# Patient Record
Sex: Female | Born: 1945 | Race: White | Hispanic: No | Marital: Married | Smoking: Former smoker
Health system: Southern US, Community
[De-identification: ages and names within clinical notes are randomized; demographics above are authoritative.]

## PROBLEM LIST (undated history)

## (undated) ENCOUNTER — Emergency Department (HOSPITAL_COMMUNITY): Payer: Medicare Other

## (undated) DIAGNOSIS — I10 Essential (primary) hypertension: Secondary | ICD-10-CM

## (undated) DIAGNOSIS — R600 Localized edema: Secondary | ICD-10-CM

## (undated) DIAGNOSIS — K219 Gastro-esophageal reflux disease without esophagitis: Secondary | ICD-10-CM

## (undated) DIAGNOSIS — F419 Anxiety disorder, unspecified: Secondary | ICD-10-CM

## (undated) DIAGNOSIS — R05 Cough: Secondary | ICD-10-CM

## (undated) DIAGNOSIS — R251 Tremor, unspecified: Secondary | ICD-10-CM

## (undated) DIAGNOSIS — M25569 Pain in unspecified knee: Secondary | ICD-10-CM

## (undated) DIAGNOSIS — G40909 Epilepsy, unspecified, not intractable, without status epilepticus: Secondary | ICD-10-CM

## (undated) DIAGNOSIS — R55 Syncope and collapse: Secondary | ICD-10-CM

## (undated) DIAGNOSIS — E785 Hyperlipidemia, unspecified: Secondary | ICD-10-CM

## (undated) DIAGNOSIS — I2510 Atherosclerotic heart disease of native coronary artery without angina pectoris: Secondary | ICD-10-CM

## (undated) DIAGNOSIS — R011 Cardiac murmur, unspecified: Secondary | ICD-10-CM

## (undated) DIAGNOSIS — F329 Major depressive disorder, single episode, unspecified: Secondary | ICD-10-CM

## (undated) DIAGNOSIS — K222 Esophageal obstruction: Secondary | ICD-10-CM

## (undated) DIAGNOSIS — R058 Other specified cough: Secondary | ICD-10-CM

## (undated) DIAGNOSIS — K449 Diaphragmatic hernia without obstruction or gangrene: Secondary | ICD-10-CM

## (undated) DIAGNOSIS — F32A Depression, unspecified: Secondary | ICD-10-CM

## (undated) DIAGNOSIS — R0601 Orthopnea: Secondary | ICD-10-CM

## (undated) DIAGNOSIS — R0602 Shortness of breath: Secondary | ICD-10-CM

## (undated) DIAGNOSIS — R5383 Other fatigue: Secondary | ICD-10-CM

## (undated) HISTORY — DX: Other fatigue: R53.83

## (undated) HISTORY — PX: ABDOMINAL HYSTERECTOMY: SUR658

## (undated) HISTORY — DX: Diaphragmatic hernia without obstruction or gangrene: K44.9

## (undated) HISTORY — DX: Morbid (severe) obesity due to excess calories: E66.01

## (undated) HISTORY — DX: Anxiety disorder, unspecified: F41.9

## (undated) HISTORY — DX: Epilepsy, unspecified, not intractable, without status epilepticus: G40.909

## (undated) HISTORY — DX: Hyperlipidemia, unspecified: E78.5

## (undated) HISTORY — DX: Shortness of breath: R06.02

## (undated) HISTORY — PX: KNEE ARTHROSCOPY: SUR90

## (undated) HISTORY — DX: Orthopnea: R06.01

## (undated) HISTORY — DX: Cough: R05

## (undated) HISTORY — DX: Syncope and collapse: R55

## (undated) HISTORY — DX: Localized edema: R60.0

## (undated) HISTORY — DX: Gastro-esophageal reflux disease without esophagitis: K21.9

## (undated) HISTORY — DX: Atherosclerotic heart disease of native coronary artery without angina pectoris: I25.10

## (undated) HISTORY — DX: Tremor, unspecified: R25.1

## (undated) HISTORY — DX: Other specified cough: R05.8

## (undated) HISTORY — DX: Major depressive disorder, single episode, unspecified: F32.9

## (undated) HISTORY — DX: Esophageal obstruction: K22.2

## (undated) HISTORY — DX: Essential (primary) hypertension: I10

## (undated) HISTORY — PX: TRANSTHORACIC ECHOCARDIOGRAM: SHX275

## (undated) HISTORY — DX: Pain in unspecified knee: M25.569

## (undated) HISTORY — DX: Depression, unspecified: F32.A

---

## 2006-01-04 DIAGNOSIS — K449 Diaphragmatic hernia without obstruction or gangrene: Secondary | ICD-10-CM

## 2006-01-04 DIAGNOSIS — K222 Esophageal obstruction: Secondary | ICD-10-CM

## 2006-01-04 HISTORY — DX: Diaphragmatic hernia without obstruction or gangrene: K44.9

## 2006-01-04 HISTORY — DX: Esophageal obstruction: K22.2

## 2006-04-19 ENCOUNTER — Ambulatory Visit: Payer: Self-pay | Admitting: Gastroenterology

## 2006-04-19 LAB — CONVERTED CEMR LAB
ALT: 23 units/L (ref 0–40)
AST: 22 units/L (ref 0–37)
Albumin: 3.6 g/dL (ref 3.5–5.2)
Bilirubin, Direct: 0.1 mg/dL (ref 0.0–0.3)
Folate: 11.4 ng/mL
Iron: 60 ug/dL (ref 42–145)
Vitamin B-12: 851 pg/mL (ref 211–911)

## 2006-04-27 ENCOUNTER — Ambulatory Visit: Payer: Self-pay | Admitting: Gastroenterology

## 2006-05-02 ENCOUNTER — Ambulatory Visit: Payer: Self-pay | Admitting: Gastroenterology

## 2006-05-04 ENCOUNTER — Emergency Department (HOSPITAL_COMMUNITY): Admission: EM | Admit: 2006-05-04 | Discharge: 2006-05-04 | Payer: Self-pay | Admitting: Emergency Medicine

## 2006-05-18 ENCOUNTER — Ambulatory Visit: Payer: Self-pay | Admitting: Gastroenterology

## 2006-05-23 ENCOUNTER — Ambulatory Visit: Payer: Self-pay | Admitting: Gastroenterology

## 2006-06-09 ENCOUNTER — Ambulatory Visit: Payer: Self-pay | Admitting: Gastroenterology

## 2006-06-09 LAB — CONVERTED CEMR LAB: TSH: 3.41 microintl units/mL (ref 0.35–5.50)

## 2007-05-20 DIAGNOSIS — E039 Hypothyroidism, unspecified: Secondary | ICD-10-CM | POA: Insufficient documentation

## 2007-05-20 DIAGNOSIS — K589 Irritable bowel syndrome without diarrhea: Secondary | ICD-10-CM

## 2007-05-20 DIAGNOSIS — K222 Esophageal obstruction: Secondary | ICD-10-CM

## 2007-05-20 DIAGNOSIS — K5730 Diverticulosis of large intestine without perforation or abscess without bleeding: Secondary | ICD-10-CM

## 2007-05-20 DIAGNOSIS — F339 Major depressive disorder, recurrent, unspecified: Secondary | ICD-10-CM

## 2007-05-20 DIAGNOSIS — K449 Diaphragmatic hernia without obstruction or gangrene: Secondary | ICD-10-CM

## 2007-05-20 DIAGNOSIS — E1159 Type 2 diabetes mellitus with other circulatory complications: Secondary | ICD-10-CM

## 2007-05-20 DIAGNOSIS — K760 Fatty (change of) liver, not elsewhere classified: Secondary | ICD-10-CM

## 2007-05-20 DIAGNOSIS — I10 Essential (primary) hypertension: Secondary | ICD-10-CM

## 2007-05-20 DIAGNOSIS — Z8673 Personal history of transient ischemic attack (TIA), and cerebral infarction without residual deficits: Secondary | ICD-10-CM

## 2008-12-25 ENCOUNTER — Ambulatory Visit (HOSPITAL_COMMUNITY): Admission: RE | Admit: 2008-12-25 | Discharge: 2008-12-25 | Payer: Self-pay | Admitting: Cardiology

## 2009-02-07 ENCOUNTER — Ambulatory Visit (HOSPITAL_COMMUNITY): Admission: RE | Admit: 2009-02-07 | Discharge: 2009-02-07 | Payer: Self-pay | Admitting: Cardiology

## 2009-03-20 ENCOUNTER — Encounter: Admission: RE | Admit: 2009-03-20 | Discharge: 2009-03-20 | Payer: Self-pay | Admitting: Cardiology

## 2009-03-26 ENCOUNTER — Inpatient Hospital Stay (HOSPITAL_COMMUNITY): Admission: RE | Admit: 2009-03-26 | Discharge: 2009-03-30 | Payer: Self-pay | Admitting: Cardiovascular Disease

## 2009-03-27 ENCOUNTER — Encounter (INDEPENDENT_AMBULATORY_CARE_PROVIDER_SITE_OTHER): Payer: Self-pay | Admitting: Cardiovascular Disease

## 2009-03-27 ENCOUNTER — Emergency Department (HOSPITAL_COMMUNITY): Admission: EM | Admit: 2009-03-27 | Discharge: 2009-03-27 | Payer: Self-pay | Admitting: Emergency Medicine

## 2009-03-28 HISTORY — PX: CARDIAC CATHETERIZATION: SHX172

## 2009-05-01 ENCOUNTER — Inpatient Hospital Stay (HOSPITAL_COMMUNITY): Admission: EM | Admit: 2009-05-01 | Discharge: 2009-05-02 | Payer: Self-pay | Admitting: Emergency Medicine

## 2009-12-30 ENCOUNTER — Ambulatory Visit (HOSPITAL_COMMUNITY): Admission: RE | Admit: 2009-12-30 | Payer: Self-pay | Source: Home / Self Care | Admitting: Cardiology

## 2010-01-24 ENCOUNTER — Encounter: Payer: Self-pay | Admitting: Family Medicine

## 2010-01-25 ENCOUNTER — Encounter: Payer: Self-pay | Admitting: Cardiology

## 2010-03-24 LAB — GLUCOSE, CAPILLARY
Glucose-Capillary: 111 mg/dL — ABNORMAL HIGH (ref 70–99)
Glucose-Capillary: 265 mg/dL — ABNORMAL HIGH (ref 70–99)
Glucose-Capillary: 320 mg/dL — ABNORMAL HIGH (ref 70–99)
Glucose-Capillary: 354 mg/dL — ABNORMAL HIGH (ref 70–99)
Glucose-Capillary: 416 mg/dL — ABNORMAL HIGH (ref 70–99)
Glucose-Capillary: 430 mg/dL — ABNORMAL HIGH (ref 70–99)

## 2010-03-24 LAB — BASIC METABOLIC PANEL
Chloride: 93 mEq/L — ABNORMAL LOW (ref 96–112)
Creatinine, Ser: 1.11 mg/dL (ref 0.4–1.2)
GFR calc non Af Amer: 49 mL/min — ABNORMAL LOW (ref >60)
Glucose, Bld: 431 mg/dL — ABNORMAL HIGH (ref 70–99)

## 2010-03-24 LAB — CARDIAC PANEL(CRET KIN+CKTOT+MB+TROPI)
CK, MB: 1.8 ng/mL (ref 0.3–4.0)
Relative Index: INVALID (ref 0.0–2.5)
Total CK: 77 U/L (ref 7–177)

## 2010-03-24 LAB — URINALYSIS, ROUTINE W REFLEX MICROSCOPIC
Glucose, UA: >1000 mg/dL — AB
Hgb urine dipstick: NEGATIVE
Ketones, ur: NEGATIVE mg/dL
Leukocytes, UA: NEGATIVE
Protein, ur: NEGATIVE mg/dL
Urobilinogen, UA: 0.2 mg/dL (ref 0.0–1.0)
pH: 5 (ref 5.0–8.0)

## 2010-03-24 LAB — DIFFERENTIAL
Basophils Absolute: 0 10*3/uL (ref 0.0–0.1)
Neutro Abs: 5.9 10*3/uL (ref 1.7–7.7)
Neutrophils Relative %: 72 % (ref 43–77)

## 2010-03-24 LAB — CBC
Platelets: 295 10*3/uL (ref 150–400)
RDW: 15.4 % (ref 11.5–15.5)

## 2010-03-24 LAB — URINE MICROSCOPIC-ADD ON

## 2010-03-24 LAB — CK TOTAL AND CKMB (NOT AT ARMC)
CK, MB: 1.8 ng/mL (ref 0.3–4.0)
Total CK: 93 U/L (ref 7–177)

## 2010-03-29 LAB — BASIC METABOLIC PANEL
BUN: 16 mg/dL (ref 6–23)
BUN: 8 mg/dL (ref 6–23)
CO2: 25 mEq/L (ref 19–32)
Calcium: 8.3 mg/dL — ABNORMAL LOW (ref 8.4–10.5)
Calcium: 8.4 mg/dL (ref 8.4–10.5)
Chloride: 104 mEq/L (ref 96–112)
Chloride: 106 mEq/L (ref 96–112)
Creatinine, Ser: 0.67 mg/dL (ref 0.4–1.2)
Creatinine, Ser: 0.78 mg/dL (ref 0.4–1.2)
GFR calc Af Amer: >60 mL/min (ref >60)
GFR calc Af Amer: >60 mL/min (ref >60)
GFR calc Af Amer: >60 mL/min (ref >60)
GFR calc non Af Amer: >60 mL/min (ref >60)
GFR calc non Af Amer: >60 mL/min (ref >60)
Glucose, Bld: 296 mg/dL — ABNORMAL HIGH (ref 70–99)
Potassium: 4 mEq/L (ref 3.5–5.1)
Potassium: 4.2 mEq/L (ref 3.5–5.1)
Sodium: 138 mEq/L (ref 135–145)

## 2010-03-29 LAB — GLUCOSE, CAPILLARY
Glucose-Capillary: 241 mg/dL — ABNORMAL HIGH (ref 70–99)
Glucose-Capillary: 241 mg/dL — ABNORMAL HIGH (ref 70–99)
Glucose-Capillary: 247 mg/dL — ABNORMAL HIGH (ref 70–99)
Glucose-Capillary: 247 mg/dL — ABNORMAL HIGH (ref 70–99)
Glucose-Capillary: 329 mg/dL — ABNORMAL HIGH (ref 70–99)
Glucose-Capillary: 332 mg/dL — ABNORMAL HIGH (ref 70–99)
Glucose-Capillary: 397 mg/dL — ABNORMAL HIGH (ref 70–99)

## 2010-03-29 LAB — POCT I-STAT, CHEM 8
Calcium, Ion: 1.04 mmol/L — ABNORMAL LOW (ref 1.12–1.32)
Creatinine, Ser: 0.5 mg/dL (ref 0.4–1.2)
Glucose, Bld: 279 mg/dL — ABNORMAL HIGH (ref 70–99)
Hemoglobin: 11.6 g/dL — ABNORMAL LOW (ref 12.0–15.0)
Potassium: 4.4 mEq/L (ref 3.5–5.1)
TCO2: 25 mmol/L (ref 0–100)

## 2010-03-29 LAB — TROPONIN I: Troponin I: 3.74 ng/mL (ref 0.00–0.06)

## 2010-03-29 LAB — CARDIAC PANEL(CRET KIN+CKTOT+MB+TROPI): CK, MB: 199.1 ng/mL (ref 0.3–4.0)

## 2010-03-29 LAB — CBC
HCT: 27.4 % — ABNORMAL LOW (ref 36.0–46.0)
HCT: 30.2 % — ABNORMAL LOW (ref 36.0–46.0)
Hemoglobin: 10.1 g/dL — ABNORMAL LOW (ref 12.0–15.0)
Hemoglobin: 9.2 g/dL — ABNORMAL LOW (ref 12.0–15.0)
MCHC: 33.8 g/dL (ref 30.0–36.0)
MCV: 92 fL (ref 78.0–100.0)
Platelets: 255 10*3/uL (ref 150–400)
Platelets: 295 10*3/uL (ref 150–400)
RBC: 2.76 MIL/uL — ABNORMAL LOW (ref 3.87–5.11)
RBC: 2.98 MIL/uL — ABNORMAL LOW (ref 3.87–5.11)
RBC: 3.47 MIL/uL — ABNORMAL LOW (ref 3.87–5.11)
RDW: 16.8 % — ABNORMAL HIGH (ref 11.5–15.5)
RDW: 16.8 % — ABNORMAL HIGH (ref 11.5–15.5)
WBC: 10.3 10*3/uL (ref 4.0–10.5)
WBC: 9 10*3/uL (ref 4.0–10.5)

## 2010-03-29 LAB — APTT: aPTT: 27 seconds (ref 24–37)

## 2010-03-29 LAB — CK TOTAL AND CKMB (NOT AT ARMC): Relative Index: 15 — ABNORMAL HIGH (ref 0.0–2.5)

## 2010-05-15 ENCOUNTER — Institutional Professional Consult (permissible substitution): Payer: Self-pay | Admitting: Cardiovascular Disease

## 2010-05-19 NOTE — Assessment & Plan Note (Signed)
[facility] OFFICE NOTE   NAME:Consiglio, Hooper                         MRN:          [id number]  DATE:06/09/2006                            DOB:          [birth year]    [name] underwent endoscopy on April 18, 2006, and had a 3-cm hiatal  hernia with a peptic stricture.  Her esophagus was dilated.  She is much  better on Nexium 40 mg twice a day, but continues with regurgitation in  the early morning on awakening.  I have decided to place her on Reglan  10 mg at bedtime.   She continues with right upper quadrant pain, which is felt secondary to  fatty liver.  She recently had ultrasound on May 23, 2006, which showed  an enlarged echodense liver.  Exam, otherwise, was unremarkable without  evidence of cholelithiasis.  Liver function test and CBC were normal.  She does give a vague past history of thyroid disorder, and will check a  thyroid function test.  Her iron levels, B-12, and folate were all  normal.   As per our previous notes, [name] is bothered by severe obesity, and has  gained 60 pounds of weight over the last 5 years.  She has associated  hypertension, diabetes, and chronic depression.  She gives a vague  history of mini strokes, and also has a history of a seizure disorder.  She is on the same medications as mentioned previously in addition to  her Nexium.   She weighs 264 pounds.  Blood pressure 140/80.  Pulse was 92 and  regular.  General physical exam was not repeated at this time.   RECOMMENDATIONS:  1. Reglan 10 mg at bedtime.  We will continue twice-a-day proton pump      inhibitor therapy.  2. Referral to Dr. Alphonsa Overall for consideration of bariatric surgery      to correct her multiple problems related to obesity, including her      reflux, diabetes, hypertension, and fatty liver.  3. Continue other medications as previously outlined.   ADDENDUM  The patient also had a negative  colonoscopy on April 27, 2006 except for  diverticulosis.  Her constipation is pretty much corrected on Amitiza 24  mcg twice a day.     [name]. Nancy Iles, MD, [name], [name]  Electronically Signed    DRP/MedQ  DD: 06/09/2006  DT: 06/09/2006  Job #: [id number]   cc:   [name]. [name], M.D.

## 2010-05-22 NOTE — Assessment & Plan Note (Signed)
[facility] OFFICE NOTE   NAME:CARNEYMadelle, [name]                         MRN:          [id number]  DATE:04/19/2006                            DOB:          10/29/1945    [name] is a 65 year old white female who really lives in [place]  but travels around the country in a mobile home.  She has a child in  [place] and is currently going to be in [place] for the next  several months.  Her main complaint today is 1 of chronic irritable  bowel syndrome, alternating diarrhea and constipation, also chronic acid  reflux with periodic emesis of bilious material.   This patient has had bowel problems for many years and has been under  the care of various gastroenterologists in [place].  She apparently  had an endoscopy and colonoscopy some 10 to 15 years ago.  She continues  to have acid reflux symptoms, for which she takes over-the-counter  antacids, and has recently had some painful swallowing, but no real  dysphagia, anorexia, or weight loss.  She has used over-the-counter  Prilosec with success.  For the last 6 months, she has had increasing  constipation, abdominal gas, bloating, and lower abdominal discomfort  with increased mucus in her stools.  Occasionally, she will see some  bright red blood with straining.  In the past, used Imodium for diarrhea-  predominant irritable bowel syndrome, but now is constipated.  She  denies any specific food intolerances.  She has not had gastrointestinal  x-rays in many years.  Has never had ultrasonography.   PAST MEDICAL HISTORY:  The patient has well-controlled essential  hypertension, adult-onset diabetes, chronic depression, previous  hysterectomy.  Has had several mini-strokes.   MEDICATIONS:  1. Levemir, which is an insulin preparation that she uses at varying      dosages.  2. Keppra 500 mg 3 a day for seizures.  3. Zoloft 100 mg a day for depression.  4. Aspirin 81 mg a day.  5. Naproxen 4 tablets a day.  6. Excedrin p.m. at night.  7. As mentioned above, she uses p.r.n. Prilosec.   She denies drug allergies, except to nausea and vomiting with codeine  use.   FAMILY HISTORY:  Remarkable for atherosclerosis and diabetes in multiple  members.  She has a sister who had gallbladder disease and her mother  had some alleged colitis.   SOCIAL HISTORY:  She is married and lives with her husband.  She has a  12th grade education.  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  She  does not smoke or abuse ethanol.   REVIEW OF SYSTEMS:  Remarkable for chronic edema of her lower  extremities, associated with her marked obesity.  Excessive thirst  associated with her diabetes.  Shortness of breath with exertion.  Some  urinary incontinence.  She denies current cardiovascular or pulmonary  complaints.   EXAM:  Shows her to be a very obese-appearing, but healthy white female  in no acute distress.  She is 5 feet tall and weighs 266  pounds, blood pressure 156/88, pulse  80 and regular.  I could not appreciate stigmata of chronic liver disease or thyromegaly.  CHEST:  Clear.  She appeared to be in a regular rhythm without murmurs, gallops, or  rubs.  ABDOMEN:  No definite organomegaly, masses, or tenderness.  Bowel sounds  were normal.  EXTREMITIES:  Peripheral extremities were unremarkable.  MENTAL STATUS:  Clear.   ASSESSMENT:  1. Constipation-predominant irritable bowel syndrome.  2. Probable chronic acid reflux with associated hiatal hernia.  3. Rule out cholelithiasis.  4. Marked exogenous obesity with associated diabetes and hypertension.  5. Chronic depression on Zoloft therapy.  6. History of seizure disorder on Levemir.  7. Status post hysterectomy.  8. History of transient ischemic attacks with chronic aspirin use.   RECOMMENDATIONS:  1. High fiber diet with daily Benefiber and liberal p.o. fluids.  2. Screening laboratory  parameters.  3. Outpatient colonoscopy exam.  If this is normal, would consider      Amitiza use.  4. The patient will need ultrasound and endoscopy after colonoscopy      completed.  5. Continue other medications as per primary care physician.     Loralee Pacas. Sharlett Iles, MD, [facility], [place]  Electronically Signed    DRP/MedQ  DD: 04/19/2006  DT: 04/19/2006  Job #: [id number]

## 2010-05-28 ENCOUNTER — Telehealth: Payer: Self-pay | Admitting: Cardiovascular Disease

## 2010-05-28 ENCOUNTER — Institutional Professional Consult (permissible substitution): Payer: Self-pay | Admitting: Cardiovascular Disease

## 2010-05-28 ENCOUNTER — Encounter: Payer: Self-pay | Admitting: Internal Medicine

## 2010-05-28 ENCOUNTER — Emergency Department (HOSPITAL_COMMUNITY): Payer: Medicare Other

## 2010-05-28 ENCOUNTER — Inpatient Hospital Stay (HOSPITAL_COMMUNITY)
Admission: EM | Admit: 2010-05-28 | Discharge: 2010-05-29 | DRG: 074 | Disposition: A | Discharge status: Home / Self Care | Payer: Medicare Other | Attending: Internal Medicine | Admitting: Internal Medicine

## 2010-05-28 DIAGNOSIS — Z7982 Long term (current) use of aspirin: Secondary | ICD-10-CM

## 2010-05-28 DIAGNOSIS — I252 Old myocardial infarction: Secondary | ICD-10-CM

## 2010-05-28 DIAGNOSIS — E1149 Type 2 diabetes mellitus with other diabetic neurological complication: Principal | ICD-10-CM | POA: Diagnosis present

## 2010-05-28 DIAGNOSIS — Z7902 Long term (current) use of antithrombotics/antiplatelets: Secondary | ICD-10-CM

## 2010-05-28 DIAGNOSIS — G238 Other specified degenerative diseases of basal ganglia: Secondary | ICD-10-CM | POA: Diagnosis present

## 2010-05-28 DIAGNOSIS — Z794 Long term (current) use of insulin: Secondary | ICD-10-CM

## 2010-05-28 DIAGNOSIS — E785 Hyperlipidemia, unspecified: Secondary | ICD-10-CM | POA: Diagnosis present

## 2010-05-28 DIAGNOSIS — G40909 Epilepsy, unspecified, not intractable, without status epilepticus: Secondary | ICD-10-CM | POA: Diagnosis present

## 2010-05-28 DIAGNOSIS — Z8673 Personal history of transient ischemic attack (TIA), and cerebral infarction without residual deficits: Secondary | ICD-10-CM

## 2010-05-28 DIAGNOSIS — F341 Dysthymic disorder: Secondary | ICD-10-CM | POA: Diagnosis present

## 2010-05-28 DIAGNOSIS — I10 Essential (primary) hypertension: Secondary | ICD-10-CM | POA: Diagnosis present

## 2010-05-28 DIAGNOSIS — K219 Gastro-esophageal reflux disease without esophagitis: Secondary | ICD-10-CM | POA: Diagnosis present

## 2010-05-28 DIAGNOSIS — Z79899 Other long term (current) drug therapy: Secondary | ICD-10-CM

## 2010-05-28 DIAGNOSIS — Z9861 Coronary angioplasty status: Secondary | ICD-10-CM

## 2010-05-28 DIAGNOSIS — R55 Syncope and collapse: Secondary | ICD-10-CM | POA: Diagnosis present

## 2010-05-28 DIAGNOSIS — I2510 Atherosclerotic heart disease of native coronary artery without angina pectoris: Secondary | ICD-10-CM | POA: Diagnosis present

## 2010-05-28 LAB — CBC
MCHC: 34 g/dL (ref 30.0–36.0)
Platelets: 343 10*3/uL (ref 150–400)
RDW: 13.5 % (ref 11.5–15.5)
WBC: 9.7 10*3/uL (ref 4.0–10.5)

## 2010-05-28 LAB — DIFFERENTIAL
Basophils Absolute: 0 10*3/uL (ref 0.0–0.1)
Basophils Relative: 0 % (ref 0–1)
Eosinophils Absolute: 0.1 10*3/uL (ref 0.0–0.7)
Eosinophils Relative: 1 % (ref 0–5)
Lymphocytes Relative: 25 % (ref 12–46)
Monocytes Absolute: 0.5 10*3/uL (ref 0.1–1.0)

## 2010-05-28 LAB — CK TOTAL AND CKMB (NOT AT ARMC)
CK, MB: 1.6 ng/mL (ref 0.3–4.0)
Total CK: 64 U/L (ref 7–177)

## 2010-05-28 LAB — BASIC METABOLIC PANEL
BUN: 13 mg/dL (ref 6–23)
CO2: 29 mEq/L (ref 19–32)
Calcium: 10.1 mg/dL (ref 8.4–10.5)
GFR calc non Af Amer: >60 mL/min (ref >60)
Glucose, Bld: 249 mg/dL — ABNORMAL HIGH (ref 70–99)

## 2010-05-28 LAB — CARDIAC PANEL(CRET KIN+CKTOT+MB+TROPI)
CK, MB: 1.5 ng/mL (ref 0.3–4.0)
Relative Index: INVALID (ref 0.0–2.5)
Troponin I: <0.3 ng/mL (ref <0.30)

## 2010-05-28 LAB — GLUCOSE, CAPILLARY
Glucose-Capillary: 257 mg/dL — ABNORMAL HIGH (ref 70–99)
Glucose-Capillary: 149 mg/dL — ABNORMAL HIGH (ref 70–99)

## 2010-05-28 NOTE — Progress Notes (Signed)
Hospital Admission Note Date: 05/28/2010  Patient name: [name] Medical record number: [id number] Date of birth: 07/29/1945 Age: 65 y.o. Gender: female PCP: No primary provider on file.  Medical Service: Internal Medicine  Attending physician: Dr. Lynnae January   Pager: Resident (R2/R3): Dr. Ina Homes     [id number] Resident (R1): Dr. Leonia Reeves     Pager: [phone]  Chief Complaint: dizziness with LOC  History of Present Illness: Pt is a 65 year old pleasant woman with PMH significant for CAD s/p stenting, DM, HTN, HL and seizure disorder presents to the ED with dizziness and LOC this AM.  She reports having two episodes earlier this week during which she felt "swimmy headed" and almost lost consciousness.  This morning, about two hours after she had awoken, pt felt the same dizziness overcome her and lost consciousness while sitting on the bed putting on her bra.  She awoke sometime later, still on the bed, and reports feeling confused for about an hour after waking up.  She was unsure how long she was unconscious because of this aforementioned confusion, but she denies any fall or trauma.  Pt was very intentional in differentiating these episodes from her seizures and claimed these three episodes all felt quite different to her.  She also notes that her seizure disorder has been well-controlled on her Keppra regimen, and she cannot remember the last time she lost consciousness 2/2 her seizure disorder.    After she lost consciousness this AM, pt reports calling her new cardiologist Dr. Bosie Helper with whom she was scheduled to have her first visit today.  After reporting to him what happened, Dr. Bosie Helper advised pt to come straight to the ED rather than attending her appointment.  At that point, she was then brought to [facility] by her son-in-law.  Additionally, pt also reports having some generalized fatigue, SOB, orthopnea, intermittent lower extremity swelling, and chronic non-productive cough.  She  denies having any headache, chest pain, or SOB specifically associated with the three episodes over the past week, but does endorse having numerous episodes of substernal chest pain over the past year (and most recently 3 weeks ago) relieved with NTG x3.  Of note, pt also complains of pain between the shoulder blades.  She also denies any weakness or change in bowel or bladder function.  She reports compliance with all of her medication and reports that her only med change within the past few weeks has been a decrease in her insulin from 60 to 45 units.     Allergies: Codeine - causes itching  Medications (per pt's report): Keppra 2000mg  qAM, 1000mg  qhs Diovan 40mg  BID Plavix 75mg  qday Nexium 40mg  qday Zocor 40mg  qhs Levemir 45units qAM, 45units qhs Lasix 40mg  qday Lopressor 40mg  BID Xanax (dose unknown) prn anxiety Zoloft 100mg  qday Fish Oil, 2 tabs qday NTG prn chest pain  PMH: Seizure disorder since age 26 Insulin-dependent DM CAD s/p stent of RCA in March 2011 (planned stage for circumflex, but never completed) HTN HL Morbid Obesity Depression/Anxiety GERD Knee pain  Past Surgical History: Hysterectomy C-section x 2 Bilateral knee arthroscopy  Family History: Mother died from an MI at age 50.  Father died from complications of emphysema.  Sister had open heart surgery at age 71.  Brother died from an MI (age unknown).  Other brother had oral cancer.     Social History: Pt lived most of her life in [place].  Lives in [place] with husband of 71 years.  Worked as a Educational psychologist  for 38 years.  Has two daughters, one still in [place] and one in [place].  Denies any tobacco, alcohol or illicit drug use.  Reports getting very little physical activity 2/2 knee pain and shortness of breath.  Reports being sexually active with her husband only.    Review of Systems: Pertinent items are noted in HPI.  Physical Exam: T 98.3, BP 126/80, P 81, RR 16, O2 Sat 94% on  RA  General appearance: alert, cooperative and no distress, morbidly obese Lungs: clear to auscultation bilaterally Heart: regular rate and rhythm, S1, S2 normal, no murmur, click, rub or gallop Abdomen: soft, non-tender; bowel sounds normal; no masses,  no organomegaly, 2- cm dry healing ulcer on the right side of her abdomen. Extremities: extremities normal, atraumatic, no cyanosis or edema Lymph nodes: Cervical, supraclavicular, and axillary nodes normal. Neurologic: Alert and oriented X 3, normal strength and tone. Normal symmetric reflexes. Normal coordination and gait   Lab results:  WBC                                      9.7               4.0-10.5         K/uL  RBC                                      4.31              3.87-5.11        MIL/uL  Hemoglobin (HGB)                         13.2              12.0-15.0        g/dL  Hematocrit (HCT)                         38.8              36.0-46.0        %  MCV                                      90.0              78.0-100.0       fL  MCH -                                    30.6              26.0-34.0        pg  MCHC                                     34.0              30.0-36.0        g/dL  RDW  13.5              11.5-15.5        %  Platelet Count (PLT)                     343               150-400          K/uL  Neutrophils, %                           69                43-77            %  Lymphocytes, %                           25                12-46            %  Monocytes, %                             5                 3-12             %  Eosinophils, %                           1                 0-5              %  Basophils, %                             0                 0-1              %  Neutrophils, Absolute                    6.7               1.7-7.7          K/uL  Lymphocytes, Absolute                    2.5               0.7-4.0          K/uL  Monocytes, Absolute                       0.5               0.1-1.0          K/uL  Eosinophils, Absolute                    0.1               0.0-0.7          K/uL  Basophils, Absolute                      0.0  0.0-0.1          K/uL  Sodium (NA)                              137               135-145          mEq/L  Potassium (K)                            3.9               3.5-5.1          mEq/L  Chloride                                 98                96-112           mEq/L  CO2                                      29                19-32            mEq/L  Glucose                                  249        h      70-99            mg/dL  BUN                                      13                6-23             mg/dL  Creatinine                               0.62              0.4-1.2          mg/dL  GFR, Est Non African American            >60               >60              mL/min  GFR, Est African American                >60               >60              mL/min    Oversized comment, see footnote  1  Calcium                                  10.1              8.4-10.5  mg/dL  Creatine Kinase, Total                   64                7-177            U/L  CK, MB                                   1.6               0.3-4.0          ng/mL  Imaging results:   Low lung volumes.  No acute findings.  CT Head w/o contrast -   IMPRESSION: No acute intracranial abnormality.  Mild atrophy and mild chronic microvascular white matter ischemic changes.   Other results:  Assessment & Plan by Problem:  1.  Syncope - Etiology unclear at this point.  Differential is quite broad, given pt's presentation, and syncope may be 2/2 cardiac causes given pt's numerous risk factors (including CAD s/p stenting then lost to follow up, DM, HTN, HL), seizure disorder, electrolyte imbalance, autonomic dysfunction, and/or polypharmacy.  Given pt's risk factors, cardiac causes are high on the ddx at this point.     CXR and Head CT  done on admission were both normal   CMET and CBC WNL other than elevated blood glucose of 249   Will get 2D Echo to assess cardiac function; pt reported to have normal left ventricular fxn in March 2011   Will get fasting lipid panel and A1C for risk stratification   Will check BP in UE bilaterally to assess for discrepancy   Will check cardiac enzymes x 3 and TSH  2.  CAD s/p stenting - pt underwent cardiac catheterization in March 2011; had DES to PDA placed.  Plans for staged intervention were made; however, pt never returned for additional stent placement.  Pt had appt with new cardiologist today.     Will continue home meds of ASA,  Plavix, and Lopressor . Will hold diovan for today.   Will ensure pt follows up with Cardiology shortly after discharge.  3.  IDDM, poorly-controlled - pt reports most recent A1C was 11; has improved compared to previously.  Pt taking Levemir 45units qAM and qhs.  Will begin home dose of Levemir and adjust accordingly. Will also start her on SSI.  Will encourage pt to follow up with Dr. Hassell Done, pt's PCP.  4.  Seizure disorder - pt reports her seizure disorder fairly well-controlled on home regimen of Keppra 2000mg  qAM and 1000mg  qhs.  Will continue home meds during this hospital admission.  5.  HTN - normotensive on admission; BP appears to be well-controlled on Lopressor, Lasix and Diovan.  Will hold Diovan for now and continue all other home BP meds.  Will monitor closely.  6.  HL - will check fasting lipid panel as aforementioned.  Will continue statin during this admission.  7.  Depression/Anxiety - will continue home meds of Zoloft and Xanax prn  8.  GERD - pt normally on Nexium 40mg  daily.  Will continue pt on protonix while inpatient  9.  DVT ppx - lovenox.  Loni Beckwith     [place]         PGYIII   Attending  Dr. Lynnae January

## 2010-05-28 NOTE — Telephone Encounter (Signed)
Pt's husband called said that Laisa fainted this morning and wanted to know if he should take to ER spoke with Jenne Pane who tried to pick up tell to take to ER

## 2010-05-29 DIAGNOSIS — R55 Syncope and collapse: Secondary | ICD-10-CM

## 2010-05-29 DIAGNOSIS — I359 Nonrheumatic aortic valve disorder, unspecified: Secondary | ICD-10-CM

## 2010-05-29 LAB — URINALYSIS, ROUTINE W REFLEX MICROSCOPIC
Bilirubin Urine: NEGATIVE
Hgb urine dipstick: NEGATIVE
Ketones, ur: NEGATIVE mg/dL
Nitrite: NEGATIVE
Specific Gravity, Urine: 1.017 (ref 1.005–1.030)
pH: 6 (ref 5.0–8.0)

## 2010-05-29 LAB — GLUCOSE, CAPILLARY: Glucose-Capillary: 131 mg/dL — ABNORMAL HIGH (ref 70–99)

## 2010-05-29 LAB — CBC
HCT: 36.8 % (ref 36.0–46.0)
MCH: 30.6 pg (ref 26.0–34.0)
MCV: 90.9 fL (ref 78.0–100.0)
RBC: 4.05 MIL/uL (ref 3.87–5.11)
WBC: 11.1 10*3/uL — ABNORMAL HIGH (ref 4.0–10.5)

## 2010-05-29 LAB — COMPREHENSIVE METABOLIC PANEL
ALT: 13 U/L (ref 0–35)
AST: 17 U/L (ref 0–37)
Albumin: 3.1 g/dL — ABNORMAL LOW (ref 3.5–5.2)
Alkaline Phosphatase: 84 U/L (ref 39–117)
Chloride: 103 mEq/L (ref 96–112)
GFR calc Af Amer: >60 mL/min (ref >60)
Potassium: 3.4 mEq/L — ABNORMAL LOW (ref 3.5–5.1)
Sodium: 140 mEq/L (ref 135–145)
Total Bilirubin: 0.2 mg/dL — ABNORMAL LOW (ref 0.3–1.2)
Total Protein: 7.2 g/dL (ref 6.0–8.3)

## 2010-05-29 LAB — LIPID PANEL
Cholesterol: 167 mg/dL (ref 0–200)
HDL: 39 mg/dL — ABNORMAL LOW (ref >39)
Triglycerides: 161 mg/dL — ABNORMAL HIGH (ref <150)
VLDL: 32 mg/dL (ref 0–40)

## 2010-05-29 LAB — CARDIAC PANEL(CRET KIN+CKTOT+MB+TROPI)
CK, MB: 1.5 ng/mL (ref 0.3–4.0)
Total CK: 59 U/L (ref 7–177)

## 2010-05-29 LAB — HEMOGLOBIN A1C
Hgb A1c MFr Bld: 15.3 % — ABNORMAL HIGH (ref <5.7)
Mean Plasma Glucose: 392 mg/dL — ABNORMAL HIGH (ref <117)

## 2010-06-02 ENCOUNTER — Encounter: Payer: Self-pay | Admitting: Cardiovascular Disease

## 2010-06-02 ENCOUNTER — Ambulatory Visit (INDEPENDENT_AMBULATORY_CARE_PROVIDER_SITE_OTHER): Payer: Medicare Other | Admitting: Cardiovascular Disease

## 2010-06-02 VITALS — BP 108/76 | HR 74 | Ht 60.0 in | Wt 240.0 lb

## 2010-06-02 DIAGNOSIS — R55 Syncope and collapse: Secondary | ICD-10-CM | POA: Insufficient documentation

## 2010-06-02 DIAGNOSIS — I25118 Atherosclerotic heart disease of native coronary artery with other forms of angina pectoris: Secondary | ICD-10-CM | POA: Insufficient documentation

## 2010-06-02 DIAGNOSIS — E669 Obesity, unspecified: Secondary | ICD-10-CM

## 2010-06-02 DIAGNOSIS — I2510 Atherosclerotic heart disease of native coronary artery without angina pectoris: Secondary | ICD-10-CM

## 2010-06-02 NOTE — Assessment & Plan Note (Signed)
She has a history of coronary artery disease and has a stent in her right coronary artery. She has moderate narrowings are obtained marginal artery in her mid LAD. I'll need to review the angiograms but sounds like these lesions  are moderate and can be treated medically. She's been stable for the past year and has not had any episodes of angina. I strongly recommended that she work on good diet and exercise program and efforts to lose weight. If she does not control her diabetes, I do not think we  will be successful in  treating her coronary artery disease.

## 2010-06-02 NOTE — Assessment & Plan Note (Signed)
[name] presents with an episode of syncope last week. She has been evaluated in the emergency room and was monitored overnight. The discharge summary suggests that she had passed out episode while in hospital but there is no mention of cardiac arrhythmia.  Has history of seizures and her symptoms sound like a seizure.  I'll place an event monitor on her. If she has any significant episodes of bradycardia or sinus pauses we will consider placing a pacemaker. Otherwise, I  doubt that her episodes of syncope are related to a cardiovascular issue. She does have very poorly controlled diabetes and may have some dysautonomia. We will continue to let her medical doctor address that issue.

## 2010-06-02 NOTE — Progress Notes (Signed)
[name] Date of Birth  11/03/1945 [facility] [id number], [place]  [address] [phone]  Fax  [phone]  History of Present Illness: [name] is a middle-age female with history of coronary artery disease and recent episode of syncope progress to see her today for further evaluation.  The patient has a history of coronary artery disease and is status post stenting of right coronary artery by Dr. Ancil Linsey  last year. The procedure was complicated by her falling off the catheter lab table. She has 2 other moderate coronary artery stenosis that have been treated medically that have not yet been stented. By the description in the chart, they're not  severe. She's not had any episodes of angina.  She has a history of seizures. She recently had an episode of passing out. She was out for an unknown period of time. She was very somnolent during rest a day. She present to the ER and had a relatively unremarkable workup here she was confused the next 3 hours. She denies any angina during or after the episode.   Does not exercise.  Has not had very good glucose control recently.  Her last hemoglobin A1c was greater than 15.  Current Outpatient Prescriptions  Medication Sig Dispense Refill  . aspirin 325 MG tablet Take 325 mg by mouth daily.        . clopidogrel (PLAVIX) 75 MG tablet Take 75 mg by mouth daily.        Marland Kitchen esomeprazole (NEXIUM) 40 MG capsule Take 40 mg by mouth daily before breakfast.        . furosemide (LASIX) 40 MG tablet Take 40 mg by mouth daily.        . insulin glargine (LANTUS) 100 UNIT/ML injection Inject 45 Units into the skin 2 (two) times daily.        Marland Kitchen levETIRAcetam (KEPPRA) 500 MG tablet Take 500 mg by mouth as directed. 4 tablets po in the am, and 2 tablets po in the pm       . nitroGLYCERIN (NITROSTAT) 0.4 MG SL tablet Place 0.4 mg under the tongue every 5 (five) minutes as needed.        .  potassium chloride (K-DUR,KLOR-CON) 10 MEQ tablet Take 10 mEq by mouth 2 (two) times daily.        . sertraline (ZOLOFT) 100 MG tablet Take 100 mg by mouth daily.        . simvastatin (ZOCOR) 40 MG tablet Take 40 mg by mouth at bedtime.        . valsartan (DIOVAN) 40 MG tablet Take 40 mg by mouth 2 (two) times daily.        Marland Kitchen DISCONTD: bisoprolol (ZEBETA) 5 MG tablet Take 5 mg by mouth 2 (two) times daily.        Marland Kitchen DISCONTD: ALPRAZolam (XANAX) 0.5 MG tablet Take 0.5 mg by mouth at bedtime as needed.        Marland Kitchen DISCONTD: aspirin 81 MG tablet Take 81 mg by mouth daily.           Allergies  Allergen Reactions  . Codeine Itching    Past Medical History  Diagnosis Date  . Syncope and collapse   . Seizure disorder   . Diabetes mellitus     insulin dependent  . Hyperlipidemia   . Hypertension   . Morbid obesity   . Depression   . Anxiety   .  GERD (gastroesophageal reflux disease)   . Knee pain   . Coronary artery disease   . Fatigue   . SOB (shortness of breath)   . Orthopnea   . Edema of lower extremity   . Nonproductive cough     chronic    Past Surgical History  Procedure Date  . Knee arthroscopy   . Transthoracic echocardiogram     showed ef of 65% with no regional wall motion abnormalities  . Cardiac catheterization 03/28/2009    History  Smoking status  . Former Smoker  . Quit date: 06/02/1970  Smokeless tobacco  . Not on file    History  Alcohol Use No    Family History  Problem Relation Age of Onset  . Heart attack Mother   . Hypertension Mother   . COPD Father   . Heart disease Sister   . Diabetes Brother   . Diabetes Brother     Reviw of Systems:  Reviewed in the HPI.  All other systems are negative.  Physical Exam: BP 108/76  Pulse 74  Ht 5' (1.524 m)  Wt 240 lb (108.863 kg)  BMI 46.87 kg/m2 The patient is alert and oriented x 3.  The mood and affect are normal.  The skin is warm and dry.  Color is normal.  The HEENT exam reveals that the  sclera are nonicteric.  The mucous membranes are moist.  The carotids are 2+ without bruits.  There is no thyromegaly.  There is no JVD.  The lungs are clear.  The chest wall is non tender.  The heart exam reveals a regular rate with a normal S1 and S2.  There are no murmurs, gallops, or rubs.  The PMI is not displaced.   Abdominal exam reveals good bowel sounds.  There is no guarding or rebound.  There is no hepatosplenomegaly or tenderness.  There are no masses.  Exam of the legs reveal no clubbing, cyanosis, or edema.  The legs are without rashes.  The distal pulses are intact.  Cranial nerves II - XII are intact.  Motor and sensory functions are intact.  She came into the office in wheelchair. She had tremendous difficulty in getting up on exam table. I was not able to assess her gait.  ECG: From the ER reveals NSR.  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

## 2010-06-02 NOTE — Assessment & Plan Note (Signed)
She is morbidly obese and has a lot of difficulty getting around. She arrived in the office in wheelchair. She had great difficulty getting up on the exam table. I've strongly encouraged her to lose weight since it is definitely affecting her mobility. I suspect this is also the reason for her fall off of the cath table.

## 2010-06-04 ENCOUNTER — Telehealth: Payer: Self-pay | Admitting: Cardiovascular Disease

## 2010-06-04 NOTE — Telephone Encounter (Signed)
Pt contacted and informed to wear for 30 days, approx 07/03/10. Pt verbalized understanding. Corwin Levins RN

## 2010-06-04 NOTE — Telephone Encounter (Signed)
No answer, will try to call again

## 2010-06-04 NOTE — Telephone Encounter (Signed)
Pt wants to know how long she needs to wear monitor please call

## 2010-06-08 NOTE — Discharge Summary (Signed)
[name], [name]                ACCOUNT NO.:  [id number]  MEDICAL RECORD NO.:  [id number]           PATIENT TYPE:  I  LOCATION:  [id number]                         FACILITY:  [facility]  PHYSICIAN:  Larey Dresser, M.D.DATE OF BIRTH:  August 14, 1945  DATE OF ADMISSION:  05/28/2010 DATE OF DISCHARGE:  05/29/2010                              DISCHARGE SUMMARY   DISCHARGE DIAGNOSES: 1. 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 2. Seizure disorder since age 68. 62. Insulin-dependent diabetes mellitus with A1c of 15.3 in May 2012. 4. Coronary artery disease status post right coronary artery stenting     in March 2011. 5. Hypertension. 6. Hyperlipidemia. 7. Morbid obesity. 8. Depression/anxiety. 9. Gastroesophageal reflux disease. 10.Knee pain status post bilateral knee arthroscopy.  DISCHARGE MEDS WITH ACCURATE DOSES: 1. Keppra 500 mg, take 4 tablets by mouth in the a.m. and 2 tablets by     mouth in the p.m. 2. Diovan 40 mg, take 1 tablet by mouth twice daily. 3. Aspirin 81 mg, take 1 tablet by mouth daily. 4. Plavix 75 mg, take 1 tablet by mouth daily. 5. Nexium 40 mg, take 1 tablet by mouth daily. 6. Zocor 40 mg, take 1 tablet every night. 7. Insulin Lantus inject 45 units into the skin twice daily. 8. Lasix 40 mg, take 1 tablet by mouth once daily. 9. Bisoprolol 5 mg, take 1 tablet by mouth twice daily. 10.Xanax 0.5 mg, take 1 tablet by mouth every night as needed for     anxiety. 11.Zoloft 100 mg, take 1 tablet by mouth daily. 12.Nitroglycerin 0.4 mg, take 1 tablet by mouth sublingual every 5     minutes to a maximum of 3 tablets as needed for chest pain.  DISPOSITION AND FOLLOWUP:  The patient was discharged home in stable and improved condition from [facility] on May 29, 2010.  The patient has been scheduled a followup appointment with Dr. Acie Fredrickson with [facility] on Jun 02, 2010, at 3 p.m.  The patient  herself and her daughter was emphasized on the  need to make a followup appointment with her PCP, Dr. Hassell Done in a week.  At that time, her diabetic regimen needs to be readjusted based on her blood sugars.  Of note, the patient's A1c during this hospitalization was 15.3.  Her blood pressure also needs to be checked during that visit and appropriate adjustment in the antihypertensive regimen needs to be made.  PROCEDURES PERFORMED: 1. A 2-D echo on May 29, 2010, which showed ejection fraction of 60%     to 65% with no regional wall motion abnormalities.  Grade 1     diastolic dysfunction. 2. Chest x-ray on May 28, 2010, which showed low lung volumes.  No     acute findings. 3. CT head on May 28, 2010, which showed no acute intracranial     abnormality.  Mild atrophy and mild chronic microvascular white     matter ischemic changes.  CONSULTATIONS:  None.  ADMITTING HISTORY AND PHYSICAL:  A 65 year old woman with past medical history significant for CAD  status post stenting diabetes, hypertension who presented to ER with dizziness and loss of consciousness on the morning of her admission.  She reports having 2 episodes earlier this week during which she felt headache and almost lost consciousness.  On the morning of her admission, 2 hours after she had woken up, she felt lightheaded and lost conscious while sitting on the bed putting on her clothes.  She woke some time later, reported feeling confused about an hour after waking up.  She has been unsure of how long she was unconscious, but denied any fall or trauma.  She claims that these episodes were not typical of her seizures and her seizures have been mainly controlled on her Keppra regimen.  After she lost consciousness, the patient called her cardiologist, Dr. Acie Fredrickson who advised her to come to the ER here.  The patient also reports some generalized fatigue, shortness of breath, orthopnea, intermittent lower extremity  swelling, and chronic nonproductive cough.  Denied any headache, chest pain, but she does endorse numerous episodes of substernal chest pain over the past year that gets relieved with nitroglycerin.  She also complains of some pain between her shoulder blades, but denies any bladder or bowel dysfunction. VITALS:  Temperature 98.3, blood pressure 126/80, pulse of 81, respiratory rate 16, O2 sats 94% on room air.  PHYSICAL EXAMINATION:  GENERAL:  Alert, cooperative in no acute distress, morbidly obese. LUNGS:  Clear to auscultation bilaterally. CVS:  Regular rate rhythm.  S1 and S2 normal.  No murmur, rubs, or gallops. ABDOMEN:  Soft, nontender, positive bowel sounds, 2-cm dry, healing scab in the right lower quadrant of her belly. EXTREMITIES:  Normal, atraumatic.  No cyanosis or edema. LYMPH NODES:  No cervical, supraclavicular, or axillary lymphadenopathy. NEUROLOGIC:  Alert, oriented x3, strength 5/5 bilaterally in all 4 extremities, normal reflexes and gait.  LABS ON ADMISSION:  White cell 9.7, hemoglobin 13.2, hematocrit 38.8, platelets of 343.  Sodium 137, potassium 3.9, chloride 98, bicarb 29, glucose 249, BUN 13, creatinine of 0.62.  CK total 64 and CK-MB 1.6.  HOSPITAL COURSE BY PROBLEM: 1. Syncope, etiology unclear, but most likely from her autonomic     dysfunction from her diabetes.  She had full cardiac workup     including 3 sets of cardiac enzymes, which were negative.  2-D echo     which showed EF of 65% with no regional wall motion abnormalities     and EKG was unchanged from her prior EKG.  She was ruled out for     any electrolyte abnormalities.  The patient did not have any focal     neurological deficits to support CVA or TIA to be a cause for her     syncope.  The patient herself claims that this was unlikely to be     her like her prior seizure episodes.  So etiology of syncope was thought      to be likely related to autonomic dysfunction. 2.  Insulin-dependent diabetes mellitus.  Her A1c during this admission     was 15.3, but her blood sugars were running between 150s and 200s     with one high of 400.  Based on those values, we did not feel     comfortable to increase her dose of Lantus on just observing her     for 1 day.  Also the patient admits that she takes lot of     carbohydrates in her diet and understands the need to  make some lifestyle and  diet changes.  The patient was     strongly encouraged to do that and her discharge insulin dose was     not increased.  The patient needs to follow up with her primary     care physician for appropriate adjustment in her regimen. 3. Hypertension.  The patient's blood pressure was fairly well-     controlled during this hospitalization.  Initially her      Diovan was held, but the patient was discharged home on all her     antihypertensive medications.  DISCHARGE VITALS:  Her discharge vitals include temperature 98.5, pulse 83, respiratory rate 17, blood pressure 124/73, O2 sats 94% on room air.  DISCHARGE LABS:  White cell count 11.1, hemoglobin 12.4, hematocrit 36.8, platelets of 321.  Sodium 140, potassium 3.4, chloride 103, bicarb 30, glucose 151, BUN 14, creatinine 0.59.  Her lipid total cholesterol 67, LDL 96, and HDL of 39.    ______________________________ Pedro Earls, MD   ______________________________ Larey Dresser, M.D.    MS/MEDQ  D:  05/29/2010  T:  05/30/2010  Job:  [id number]  cc:   Dr. Christella Scheuermann, M.D.  Electronically Signed by Pedro Earls MD on 06/01/2010 09:18:13 PM Electronically Signed by Larey Dresser M.D. on 06/08/2010 12:05:18 PM

## 2010-06-19 ENCOUNTER — Telehealth: Payer: Self-pay | Admitting: Cardiovascular Disease

## 2010-06-19 NOTE — Telephone Encounter (Signed)
Pt called and told to pick up pads,

## 2010-06-19 NOTE — Telephone Encounter (Signed)
Pt is wearing heart monitor she wants to know where to get sticky pads? She doesn't have many left

## 2010-07-10 ENCOUNTER — Encounter: Payer: Self-pay | Admitting: Cardiovascular Disease

## 2010-09-02 ENCOUNTER — Ambulatory Visit: Payer: Medicare Other | Admitting: Cardiovascular Disease

## 2011-02-10 DIAGNOSIS — IMO0001 Reserved for inherently not codable concepts without codable children: Secondary | ICD-10-CM | POA: Diagnosis not present

## 2011-02-10 DIAGNOSIS — R569 Unspecified convulsions: Secondary | ICD-10-CM | POA: Diagnosis not present

## 2011-02-10 DIAGNOSIS — I10 Essential (primary) hypertension: Secondary | ICD-10-CM | POA: Diagnosis not present

## 2011-02-10 DIAGNOSIS — R0602 Shortness of breath: Secondary | ICD-10-CM | POA: Diagnosis not present

## 2011-02-10 DIAGNOSIS — F411 Generalized anxiety disorder: Secondary | ICD-10-CM | POA: Diagnosis not present

## 2011-02-10 DIAGNOSIS — E782 Mixed hyperlipidemia: Secondary | ICD-10-CM | POA: Diagnosis not present

## 2011-02-10 DIAGNOSIS — K219 Gastro-esophageal reflux disease without esophagitis: Secondary | ICD-10-CM | POA: Diagnosis not present

## 2011-02-10 DIAGNOSIS — E663 Overweight: Secondary | ICD-10-CM | POA: Diagnosis not present

## 2011-02-24 DIAGNOSIS — R809 Proteinuria, unspecified: Secondary | ICD-10-CM | POA: Diagnosis not present

## 2011-02-24 DIAGNOSIS — F411 Generalized anxiety disorder: Secondary | ICD-10-CM | POA: Diagnosis not present

## 2011-02-24 DIAGNOSIS — E1165 Type 2 diabetes mellitus with hyperglycemia: Secondary | ICD-10-CM | POA: Diagnosis not present

## 2011-06-14 DIAGNOSIS — I2510 Atherosclerotic heart disease of native coronary artery without angina pectoris: Secondary | ICD-10-CM | POA: Diagnosis not present

## 2011-06-14 DIAGNOSIS — I252 Old myocardial infarction: Secondary | ICD-10-CM | POA: Diagnosis not present

## 2011-06-14 DIAGNOSIS — E119 Type 2 diabetes mellitus without complications: Secondary | ICD-10-CM | POA: Diagnosis not present

## 2011-06-14 DIAGNOSIS — E785 Hyperlipidemia, unspecified: Secondary | ICD-10-CM | POA: Diagnosis not present

## 2011-07-07 ENCOUNTER — Other Ambulatory Visit: Payer: Self-pay | Admitting: *Deleted

## 2011-07-21 DIAGNOSIS — Z13220 Encounter for screening for lipoid disorders: Secondary | ICD-10-CM | POA: Diagnosis not present

## 2011-07-21 DIAGNOSIS — Z79899 Other long term (current) drug therapy: Secondary | ICD-10-CM | POA: Diagnosis not present

## 2011-07-21 DIAGNOSIS — Z1329 Encounter for screening for other suspected endocrine disorder: Secondary | ICD-10-CM | POA: Diagnosis not present

## 2011-07-21 DIAGNOSIS — E119 Type 2 diabetes mellitus without complications: Secondary | ICD-10-CM | POA: Diagnosis not present

## 2011-07-21 DIAGNOSIS — Z136 Encounter for screening for cardiovascular disorders: Secondary | ICD-10-CM | POA: Diagnosis not present

## 2011-07-21 DIAGNOSIS — I2510 Atherosclerotic heart disease of native coronary artery without angina pectoris: Secondary | ICD-10-CM | POA: Diagnosis not present

## 2011-07-21 DIAGNOSIS — R609 Edema, unspecified: Secondary | ICD-10-CM | POA: Diagnosis not present

## 2011-07-21 DIAGNOSIS — I10 Essential (primary) hypertension: Secondary | ICD-10-CM | POA: Diagnosis not present

## 2011-07-30 DIAGNOSIS — R51 Headache: Secondary | ICD-10-CM | POA: Diagnosis not present

## 2011-10-19 DIAGNOSIS — E119 Type 2 diabetes mellitus without complications: Secondary | ICD-10-CM | POA: Diagnosis not present

## 2011-10-19 DIAGNOSIS — G8918 Other acute postprocedural pain: Secondary | ICD-10-CM | POA: Diagnosis not present

## 2011-10-19 DIAGNOSIS — T8189XA Other complications of procedures, not elsewhere classified, initial encounter: Secondary | ICD-10-CM | POA: Diagnosis not present

## 2011-10-19 DIAGNOSIS — K219 Gastro-esophageal reflux disease without esophagitis: Secondary | ICD-10-CM | POA: Diagnosis not present

## 2011-10-20 DIAGNOSIS — E119 Type 2 diabetes mellitus without complications: Secondary | ICD-10-CM | POA: Diagnosis not present

## 2011-10-20 DIAGNOSIS — T8189XA Other complications of procedures, not elsewhere classified, initial encounter: Secondary | ICD-10-CM | POA: Diagnosis not present

## 2011-10-20 DIAGNOSIS — G8918 Other acute postprocedural pain: Secondary | ICD-10-CM | POA: Diagnosis not present

## 2011-10-20 DIAGNOSIS — K219 Gastro-esophageal reflux disease without esophagitis: Secondary | ICD-10-CM | POA: Diagnosis not present

## 2011-10-21 ENCOUNTER — Encounter (HOSPITAL_BASED_OUTPATIENT_CLINIC_OR_DEPARTMENT_OTHER): Payer: Medicare Other

## 2011-10-22 DIAGNOSIS — K219 Gastro-esophageal reflux disease without esophagitis: Secondary | ICD-10-CM | POA: Diagnosis not present

## 2011-10-22 DIAGNOSIS — G8918 Other acute postprocedural pain: Secondary | ICD-10-CM | POA: Diagnosis not present

## 2011-10-22 DIAGNOSIS — E119 Type 2 diabetes mellitus without complications: Secondary | ICD-10-CM | POA: Diagnosis not present

## 2011-10-22 DIAGNOSIS — T8189XA Other complications of procedures, not elsewhere classified, initial encounter: Secondary | ICD-10-CM | POA: Diagnosis not present

## 2011-10-25 DIAGNOSIS — T8189XA Other complications of procedures, not elsewhere classified, initial encounter: Secondary | ICD-10-CM | POA: Diagnosis not present

## 2011-10-25 DIAGNOSIS — K219 Gastro-esophageal reflux disease without esophagitis: Secondary | ICD-10-CM | POA: Diagnosis not present

## 2011-10-25 DIAGNOSIS — E119 Type 2 diabetes mellitus without complications: Secondary | ICD-10-CM | POA: Diagnosis not present

## 2011-10-25 DIAGNOSIS — G8918 Other acute postprocedural pain: Secondary | ICD-10-CM | POA: Diagnosis not present

## 2011-10-26 DIAGNOSIS — I10 Essential (primary) hypertension: Secondary | ICD-10-CM | POA: Diagnosis not present

## 2011-10-26 DIAGNOSIS — I252 Old myocardial infarction: Secondary | ICD-10-CM | POA: Diagnosis not present

## 2011-10-26 DIAGNOSIS — L0390 Cellulitis, unspecified: Secondary | ICD-10-CM | POA: Diagnosis not present

## 2011-10-26 DIAGNOSIS — E119 Type 2 diabetes mellitus without complications: Secondary | ICD-10-CM | POA: Diagnosis not present

## 2011-10-26 DIAGNOSIS — I2510 Atherosclerotic heart disease of native coronary artery without angina pectoris: Secondary | ICD-10-CM | POA: Diagnosis not present

## 2011-10-26 DIAGNOSIS — R079 Chest pain, unspecified: Secondary | ICD-10-CM | POA: Diagnosis not present

## 2011-10-26 DIAGNOSIS — E785 Hyperlipidemia, unspecified: Secondary | ICD-10-CM | POA: Diagnosis not present

## 2011-10-26 DIAGNOSIS — R55 Syncope and collapse: Secondary | ICD-10-CM | POA: Diagnosis not present

## 2011-10-28 DIAGNOSIS — E119 Type 2 diabetes mellitus without complications: Secondary | ICD-10-CM | POA: Diagnosis not present

## 2011-10-28 DIAGNOSIS — G8918 Other acute postprocedural pain: Secondary | ICD-10-CM | POA: Diagnosis not present

## 2011-10-28 DIAGNOSIS — K219 Gastro-esophageal reflux disease without esophagitis: Secondary | ICD-10-CM | POA: Diagnosis not present

## 2011-10-28 DIAGNOSIS — T8189XA Other complications of procedures, not elsewhere classified, initial encounter: Secondary | ICD-10-CM | POA: Diagnosis not present

## 2011-11-02 DIAGNOSIS — E119 Type 2 diabetes mellitus without complications: Secondary | ICD-10-CM | POA: Diagnosis not present

## 2011-11-02 DIAGNOSIS — T8189XA Other complications of procedures, not elsewhere classified, initial encounter: Secondary | ICD-10-CM | POA: Diagnosis not present

## 2011-11-02 DIAGNOSIS — K219 Gastro-esophageal reflux disease without esophagitis: Secondary | ICD-10-CM | POA: Diagnosis not present

## 2011-11-02 DIAGNOSIS — G8918 Other acute postprocedural pain: Secondary | ICD-10-CM | POA: Diagnosis not present

## 2011-11-03 DIAGNOSIS — I359 Nonrheumatic aortic valve disorder, unspecified: Secondary | ICD-10-CM | POA: Diagnosis not present

## 2011-11-03 DIAGNOSIS — R55 Syncope and collapse: Secondary | ICD-10-CM | POA: Diagnosis not present

## 2011-11-03 DIAGNOSIS — R079 Chest pain, unspecified: Secondary | ICD-10-CM | POA: Diagnosis not present

## 2011-11-03 DIAGNOSIS — I517 Cardiomegaly: Secondary | ICD-10-CM | POA: Diagnosis not present

## 2011-11-03 DIAGNOSIS — I369 Nonrheumatic tricuspid valve disorder, unspecified: Secondary | ICD-10-CM | POA: Diagnosis not present

## 2011-11-03 DIAGNOSIS — I2510 Atherosclerotic heart disease of native coronary artery without angina pectoris: Secondary | ICD-10-CM | POA: Diagnosis not present

## 2011-11-07 DIAGNOSIS — R079 Chest pain, unspecified: Secondary | ICD-10-CM | POA: Diagnosis not present

## 2011-11-10 DIAGNOSIS — E119 Type 2 diabetes mellitus without complications: Secondary | ICD-10-CM | POA: Diagnosis not present

## 2011-11-10 DIAGNOSIS — G8918 Other acute postprocedural pain: Secondary | ICD-10-CM | POA: Diagnosis not present

## 2011-11-10 DIAGNOSIS — T8189XA Other complications of procedures, not elsewhere classified, initial encounter: Secondary | ICD-10-CM | POA: Diagnosis not present

## 2011-11-10 DIAGNOSIS — K219 Gastro-esophageal reflux disease without esophagitis: Secondary | ICD-10-CM | POA: Diagnosis not present

## 2011-11-17 DIAGNOSIS — K219 Gastro-esophageal reflux disease without esophagitis: Secondary | ICD-10-CM | POA: Diagnosis not present

## 2011-11-17 DIAGNOSIS — G8918 Other acute postprocedural pain: Secondary | ICD-10-CM | POA: Diagnosis not present

## 2011-11-17 DIAGNOSIS — E119 Type 2 diabetes mellitus without complications: Secondary | ICD-10-CM | POA: Diagnosis not present

## 2011-11-17 DIAGNOSIS — T8189XA Other complications of procedures, not elsewhere classified, initial encounter: Secondary | ICD-10-CM | POA: Diagnosis not present

## 2011-11-22 DIAGNOSIS — E785 Hyperlipidemia, unspecified: Secondary | ICD-10-CM | POA: Diagnosis not present

## 2011-11-22 DIAGNOSIS — I2510 Atherosclerotic heart disease of native coronary artery without angina pectoris: Secondary | ICD-10-CM | POA: Diagnosis not present

## 2011-11-22 DIAGNOSIS — I252 Old myocardial infarction: Secondary | ICD-10-CM | POA: Diagnosis not present

## 2011-11-22 DIAGNOSIS — R079 Chest pain, unspecified: Secondary | ICD-10-CM | POA: Diagnosis not present

## 2011-11-23 DIAGNOSIS — R0789 Other chest pain: Secondary | ICD-10-CM | POA: Diagnosis not present

## 2011-11-23 DIAGNOSIS — Z9861 Coronary angioplasty status: Secondary | ICD-10-CM | POA: Diagnosis not present

## 2011-11-23 DIAGNOSIS — Z79899 Other long term (current) drug therapy: Secondary | ICD-10-CM | POA: Diagnosis not present

## 2011-11-23 DIAGNOSIS — E785 Hyperlipidemia, unspecified: Secondary | ICD-10-CM | POA: Diagnosis not present

## 2011-11-23 DIAGNOSIS — I2510 Atherosclerotic heart disease of native coronary artery without angina pectoris: Secondary | ICD-10-CM | POA: Diagnosis not present

## 2011-11-23 DIAGNOSIS — K651 Peritoneal abscess: Secondary | ICD-10-CM | POA: Diagnosis not present

## 2011-11-23 DIAGNOSIS — E119 Type 2 diabetes mellitus without complications: Secondary | ICD-10-CM | POA: Diagnosis not present

## 2011-11-23 DIAGNOSIS — I209 Angina pectoris, unspecified: Secondary | ICD-10-CM | POA: Diagnosis not present

## 2011-11-23 DIAGNOSIS — Z7902 Long term (current) use of antithrombotics/antiplatelets: Secondary | ICD-10-CM | POA: Diagnosis not present

## 2011-11-23 DIAGNOSIS — I10 Essential (primary) hypertension: Secondary | ICD-10-CM | POA: Diagnosis not present

## 2011-11-23 DIAGNOSIS — R079 Chest pain, unspecified: Secondary | ICD-10-CM | POA: Diagnosis not present

## 2011-11-24 DIAGNOSIS — Z7902 Long term (current) use of antithrombotics/antiplatelets: Secondary | ICD-10-CM | POA: Diagnosis not present

## 2011-11-24 DIAGNOSIS — I10 Essential (primary) hypertension: Secondary | ICD-10-CM | POA: Diagnosis not present

## 2011-11-24 DIAGNOSIS — I2510 Atherosclerotic heart disease of native coronary artery without angina pectoris: Secondary | ICD-10-CM | POA: Diagnosis not present

## 2011-11-24 DIAGNOSIS — E119 Type 2 diabetes mellitus without complications: Secondary | ICD-10-CM | POA: Diagnosis not present

## 2011-11-24 DIAGNOSIS — I209 Angina pectoris, unspecified: Secondary | ICD-10-CM | POA: Diagnosis not present

## 2011-11-24 DIAGNOSIS — Z79899 Other long term (current) drug therapy: Secondary | ICD-10-CM | POA: Diagnosis not present

## 2011-11-24 DIAGNOSIS — Z9861 Coronary angioplasty status: Secondary | ICD-10-CM | POA: Diagnosis not present

## 2011-11-24 DIAGNOSIS — R0789 Other chest pain: Secondary | ICD-10-CM | POA: Diagnosis not present

## 2011-11-25 ENCOUNTER — Encounter (HOSPITAL_BASED_OUTPATIENT_CLINIC_OR_DEPARTMENT_OTHER): Payer: Medicare Other

## 2011-11-25 DIAGNOSIS — T8189XA Other complications of procedures, not elsewhere classified, initial encounter: Secondary | ICD-10-CM | POA: Diagnosis not present

## 2011-11-25 DIAGNOSIS — K219 Gastro-esophageal reflux disease without esophagitis: Secondary | ICD-10-CM | POA: Diagnosis not present

## 2011-11-25 DIAGNOSIS — E119 Type 2 diabetes mellitus without complications: Secondary | ICD-10-CM | POA: Diagnosis not present

## 2011-11-25 DIAGNOSIS — G8918 Other acute postprocedural pain: Secondary | ICD-10-CM | POA: Diagnosis not present

## 2011-11-30 DIAGNOSIS — K219 Gastro-esophageal reflux disease without esophagitis: Secondary | ICD-10-CM | POA: Diagnosis not present

## 2011-11-30 DIAGNOSIS — T8189XA Other complications of procedures, not elsewhere classified, initial encounter: Secondary | ICD-10-CM | POA: Diagnosis not present

## 2011-11-30 DIAGNOSIS — E119 Type 2 diabetes mellitus without complications: Secondary | ICD-10-CM | POA: Diagnosis not present

## 2011-11-30 DIAGNOSIS — G8918 Other acute postprocedural pain: Secondary | ICD-10-CM | POA: Diagnosis not present

## 2011-12-03 DIAGNOSIS — G8918 Other acute postprocedural pain: Secondary | ICD-10-CM | POA: Diagnosis not present

## 2011-12-03 DIAGNOSIS — K219 Gastro-esophageal reflux disease without esophagitis: Secondary | ICD-10-CM | POA: Diagnosis not present

## 2011-12-03 DIAGNOSIS — T8189XA Other complications of procedures, not elsewhere classified, initial encounter: Secondary | ICD-10-CM | POA: Diagnosis not present

## 2011-12-03 DIAGNOSIS — E119 Type 2 diabetes mellitus without complications: Secondary | ICD-10-CM | POA: Diagnosis not present

## 2011-12-08 DIAGNOSIS — M129 Arthropathy, unspecified: Secondary | ICD-10-CM | POA: Diagnosis not present

## 2011-12-08 DIAGNOSIS — F329 Major depressive disorder, single episode, unspecified: Secondary | ICD-10-CM | POA: Diagnosis not present

## 2011-12-08 DIAGNOSIS — F411 Generalized anxiety disorder: Secondary | ICD-10-CM | POA: Diagnosis not present

## 2012-01-24 ENCOUNTER — Emergency Department (HOSPITAL_COMMUNITY): Payer: Medicare Other

## 2012-01-24 ENCOUNTER — Encounter (HOSPITAL_COMMUNITY): Payer: Self-pay

## 2012-01-24 ENCOUNTER — Emergency Department (HOSPITAL_COMMUNITY)
Admission: EM | Admit: 2012-01-24 | Discharge: 2012-01-24 | Disposition: A | Discharge status: Home / Self Care | Payer: Medicare Other | Attending: Emergency Medicine | Admitting: Emergency Medicine

## 2012-01-24 DIAGNOSIS — R0989 Other specified symptoms and signs involving the circulatory and respiratory systems: Secondary | ICD-10-CM | POA: Insufficient documentation

## 2012-01-24 DIAGNOSIS — M549 Dorsalgia, unspecified: Secondary | ICD-10-CM | POA: Diagnosis not present

## 2012-01-24 DIAGNOSIS — J040 Acute laryngitis: Secondary | ICD-10-CM | POA: Diagnosis not present

## 2012-01-24 DIAGNOSIS — J069 Acute upper respiratory infection, unspecified: Secondary | ICD-10-CM | POA: Insufficient documentation

## 2012-01-24 DIAGNOSIS — F411 Generalized anxiety disorder: Secondary | ICD-10-CM | POA: Diagnosis not present

## 2012-01-24 DIAGNOSIS — R0789 Other chest pain: Secondary | ICD-10-CM | POA: Diagnosis not present

## 2012-01-24 DIAGNOSIS — R0609 Other forms of dyspnea: Secondary | ICD-10-CM | POA: Diagnosis not present

## 2012-01-24 DIAGNOSIS — I10 Essential (primary) hypertension: Secondary | ICD-10-CM | POA: Insufficient documentation

## 2012-01-24 DIAGNOSIS — F3289 Other specified depressive episodes: Secondary | ICD-10-CM | POA: Insufficient documentation

## 2012-01-24 DIAGNOSIS — E785 Hyperlipidemia, unspecified: Secondary | ICD-10-CM | POA: Insufficient documentation

## 2012-01-24 DIAGNOSIS — Z87891 Personal history of nicotine dependence: Secondary | ICD-10-CM | POA: Diagnosis not present

## 2012-01-24 DIAGNOSIS — Z7982 Long term (current) use of aspirin: Secondary | ICD-10-CM | POA: Insufficient documentation

## 2012-01-24 DIAGNOSIS — Z8709 Personal history of other diseases of the respiratory system: Secondary | ICD-10-CM | POA: Diagnosis not present

## 2012-01-24 DIAGNOSIS — Z7902 Long term (current) use of antithrombotics/antiplatelets: Secondary | ICD-10-CM | POA: Diagnosis not present

## 2012-01-24 DIAGNOSIS — Z79899 Other long term (current) drug therapy: Secondary | ICD-10-CM | POA: Diagnosis not present

## 2012-01-24 DIAGNOSIS — E119 Type 2 diabetes mellitus without complications: Secondary | ICD-10-CM | POA: Insufficient documentation

## 2012-01-24 DIAGNOSIS — B9789 Other viral agents as the cause of diseases classified elsewhere: Secondary | ICD-10-CM

## 2012-01-24 DIAGNOSIS — R0602 Shortness of breath: Secondary | ICD-10-CM | POA: Diagnosis not present

## 2012-01-24 DIAGNOSIS — B338 Other specified viral diseases: Secondary | ICD-10-CM | POA: Diagnosis not present

## 2012-01-24 DIAGNOSIS — Z951 Presence of aortocoronary bypass graft: Secondary | ICD-10-CM | POA: Insufficient documentation

## 2012-01-24 DIAGNOSIS — J029 Acute pharyngitis, unspecified: Secondary | ICD-10-CM | POA: Insufficient documentation

## 2012-01-24 DIAGNOSIS — K219 Gastro-esophageal reflux disease without esophagitis: Secondary | ICD-10-CM | POA: Diagnosis not present

## 2012-01-24 DIAGNOSIS — I2510 Atherosclerotic heart disease of native coronary artery without angina pectoris: Secondary | ICD-10-CM | POA: Diagnosis not present

## 2012-01-24 DIAGNOSIS — R5381 Other malaise: Secondary | ICD-10-CM | POA: Diagnosis not present

## 2012-01-24 DIAGNOSIS — F329 Major depressive disorder, single episode, unspecified: Secondary | ICD-10-CM | POA: Insufficient documentation

## 2012-01-24 DIAGNOSIS — Z794 Long term (current) use of insulin: Secondary | ICD-10-CM | POA: Diagnosis not present

## 2012-01-24 DIAGNOSIS — R0600 Dyspnea, unspecified: Secondary | ICD-10-CM

## 2012-01-24 DIAGNOSIS — R079 Chest pain, unspecified: Secondary | ICD-10-CM | POA: Diagnosis not present

## 2012-01-24 DIAGNOSIS — J988 Other specified respiratory disorders: Secondary | ICD-10-CM

## 2012-01-24 LAB — CBC WITH DIFFERENTIAL/PLATELET
Eosinophils Absolute: 0.1 10*3/uL (ref 0.0–0.7)
Eosinophils Relative: 1 % (ref 0–5)
Hemoglobin: 12.3 g/dL (ref 12.0–15.0)
Lymphs Abs: 2.4 10*3/uL (ref 0.7–4.0)
MCH: 30.9 pg (ref 26.0–34.0)
MCV: 90.5 fL (ref 78.0–100.0)
Monocytes Absolute: 0.7 10*3/uL (ref 0.1–1.0)
Monocytes Relative: 7 % (ref 3–12)
RBC: 3.98 MIL/uL (ref 3.87–5.11)

## 2012-01-24 LAB — BASIC METABOLIC PANEL
BUN: 15 mg/dL (ref 6–23)
CO2: 23 mEq/L (ref 19–32)
Chloride: 102 mEq/L (ref 96–112)
Creatinine, Ser: 0.57 mg/dL (ref 0.50–1.10)
Glucose, Bld: 277 mg/dL — ABNORMAL HIGH (ref 70–99)

## 2012-01-24 LAB — D-DIMER, QUANTITATIVE: D-Dimer, Quant: 0.44 ug/mL-FEU (ref 0.00–0.48)

## 2012-01-24 NOTE — ED Notes (Signed)
Patient presents with c/o shortness of breath. Has hx the same but states that she woke up tonight "feeling like I was smothered." Pt is scheduled to see her cardiologist to begin oxygen therapy soon.

## 2012-01-24 NOTE — ED Notes (Signed)
Patient has returned from xray. Phlebotomy at bedside

## 2012-01-24 NOTE — ED Provider Notes (Addendum)
History     CSN: [id number]  Arrival date & time 01/24/12  0436   First MD Initiated Contact with Patient 01/24/12 0440      Chief Complaint  Patient presents with  . Shortness of Breath    (Consider location/radiation/quality/duration/timing/severity/associated sxs/prior treatment) HPI 67 year old female presents to emergency department from home with complaint of shortness of breath and chest pain. Patient reports she's been ill recently with bodyaches, sore throat and upper respiratory illness. She thought she might have the flu, but denies any fever. She received a flu shot this season. She reports she has been feeling unwell for the last 5 days. Patient has history of coronary disease status post stent about a month ago at [facility]. She reports she has history of shortness of breath for which she has followup with her cardiologist arranged. She does not wear oxygen currently, but she feels that she would be better with it. Patient reports waking from sleep feeling as though she was being smothered. Soon after she developed chest pain that started in her mid chest and wrapped around the left side of her chest into her back. She waited about 20 minutes and when the pain had not improved, she took nitroglycerin x3. After the third nitroglycerin, chest pain resolved. She still has some shortness of breath. Patient reports she feels that she is tight in her throat as though she needs to cough or vomit up something stuck in her throat.  Past Medical History  Diagnosis Date  . Syncope and collapse   . Seizure disorder   . Diabetes mellitus     insulin dependent  . Hyperlipidemia   . Hypertension   . Morbid obesity   . Depression   . Anxiety   . GERD (gastroesophageal reflux disease)   . Knee pain   . Coronary artery disease   . Fatigue   . SOB (shortness of breath)   . Orthopnea   . Edema of lower extremity   . Nonproductive cough     chronic    Past Surgical History    Procedure Date  . Knee arthroscopy   . Transthoracic echocardiogram     showed ef of 65% with no regional wall motion abnormalities  . Cardiac catheterization 03/28/2009    Family History  Problem Relation Age of Onset  . Heart attack Mother   . Hypertension Mother   . COPD Father   . Heart disease Sister   . Diabetes Brother   . Diabetes Brother     History  Substance Use Topics  . Smoking status: Former Smoker    Quit date: 06/02/1970  . Smokeless tobacco: Not on file  . Alcohol Use: No    OB History    Grav Para Term Preterm Abortions TAB SAB Ect Mult Living                  Review of Systems  See History of Present Illness; otherwise all other systems are reviewed and negative  Allergies  Codeine  Home Medications   Current Outpatient Rx  Name  Route  Sig  Dispense  Refill  . ASPIRIN 325 MG PO TABS   Oral   Take 325 mg by mouth daily.          Marland Kitchen CLOPIDOGREL BISULFATE 75 MG PO TABS   Oral   Take 75 mg by mouth daily.           Marland Kitchen ESOMEPRAZOLE MAGNESIUM 40 MG PO CPDR  Oral   Take 40 mg by mouth daily before breakfast.           . FUROSEMIDE 40 MG PO TABS   Oral   Take 40 mg by mouth daily.           . INSULIN GLARGINE 100 UNIT/ML [place] SOLN   Subcutaneous   Inject 45 Units into the skin 2 (two) times daily.           Marland Kitchen LEVETIRACETAM 500 MG PO TABS   Oral   Take 500 mg by mouth as directed. 4 tablets po in the am, and 2 tablets po in the pm          . NITROGLYCERIN 0.4 MG SL SUBL   Sublingual   Place 0.4 mg under the tongue every 5 (five) minutes as needed.           Marland Kitchen POTASSIUM CHLORIDE CRYS ER 10 MEQ PO TBCR   Oral   Take 10 mEq by mouth 2 (two) times daily.           . SERTRALINE HCL 100 MG PO TABS   Oral   Take 100 mg by mouth daily.           Marland Kitchen SIMVASTATIN 40 MG PO TABS   Oral   Take 40 mg by mouth at bedtime.           Marland Kitchen VALSARTAN 40 MG PO TABS   Oral   Take 40 mg by mouth 2 (two) times daily.              BP 133/62  Temp 97.9 F (36.6 C) (Oral)  Resp 15  SpO2 99%  Physical Exam  Nursing note and vitals reviewed. Constitutional: She is oriented to person, place, and time. She appears distressed.       Morbidly obese female who appears older than stated age who has a chronically ill appearance. Patient noted to have noisy upper airway breathing  HENT:  Head: Normocephalic and atraumatic.  Nose: Nose normal.  Mouth/Throat: Oropharynx is clear and moist. No oropharyngeal exudate.  Eyes: Conjunctivae normal and EOM are normal. Pupils are equal, round, and reactive to light.  Neck: Normal range of motion. Neck supple. No JVD present. No tracheal deviation present. No thyromegaly present.       Patient does not have stridor, but does have increased airway noise with exhalation.  Cardiovascular: Normal rate, regular rhythm, normal heart sounds and intact distal pulses.  Exam reveals no gallop and no friction rub.   No murmur heard. Pulmonary/Chest: Effort normal. No stridor. No respiratory distress. She has no wheezes. She has no rales. She exhibits no tenderness.       There is no lower airway noise, no wheezing no rales no rhonchi.  Abdominal: Soft. Bowel sounds are normal. She exhibits no distension and no mass. There is no tenderness. There is no rebound and no guarding.  Musculoskeletal: She exhibits no edema and no tenderness.  Lymphadenopathy:    She has no cervical adenopathy.  Neurological: She is alert and oriented to person, place, and time. She exhibits normal muscle tone. Coordination normal.  Skin: Skin is warm and dry. No rash noted. She is not diaphoretic. No erythema. No pallor.    ED Course  Procedures (including critical care time)  Labs Reviewed  PRO B NATRIURETIC PEPTIDE - Abnormal; Notable for the following:    Pro B Natriuretic peptide (BNP) 240.1 (*)     All  other components within normal limits  BASIC METABOLIC PANEL - Abnormal; Notable for the following:     Glucose, Bld 277 (*)     All other components within normal limits  CBC WITH DIFFERENTIAL  TROPONIN I  D-DIMER, QUANTITATIVE  TROPONIN I  TROPONIN I   Dg Chest 2 View  01/24/2012  *RADIOLOGY REPORT*  Clinical Data: Left-sided chest pain and left upper back pain. Shortness of breath.  CHEST - 2 VIEW  Comparison: Chest radiograph performed 05/28/2010  Findings: The lungs are well-aerated and clear.  There is no evidence of focal opacification, pleural effusion or pneumothorax.  The heart is borderline normal in size; the mediastinal contour is within normal limits.  No acute osseous abnormalities are seen.  IMPRESSION: No acute cardiopulmonary process seen.   Original Report Authenticated By: Santa Lighter, M.D.     Date: 01/24/2012  Rate: 69  Rhythm: normal sinus rhythm  QRS Axis: normal  Intervals: normal  ST/T Wave abnormalities: normal  Conduction Disutrbances:none  Narrative Interpretation:   Old EKG Reviewed: unchanged    1. Dyspnea   2. Viral respiratory illness   3. Laryngitis       MDM  67 year old female with acute dyspnea and chest pain. She has coronary history. We'll get [facility] from [facility] from her most recent cardiac procedure. Will get workup here. We'll discuss with her cardiologist after workup complete. Differential includes pneumonia, viral infection, ACS, PND from CHF, PE ROM of panic attack      7:32 AM Workup has been unremarkable. Patient now with hoarse voice. Suspect viral infection causing laryngitis and tightness sensation in her throat. Discussed case with her cardiologist on call Dr Nida Boatman with [facility], who feels that she can followup in the office.  Kalman Drape, MD 01/24/12 [place], MD 01/24/12 [phone]

## 2012-01-24 NOTE — ED Notes (Signed)
Patient transported to X-ray 

## 2012-01-31 DIAGNOSIS — J069 Acute upper respiratory infection, unspecified: Secondary | ICD-10-CM | POA: Diagnosis not present

## 2012-01-31 DIAGNOSIS — J040 Acute laryngitis: Secondary | ICD-10-CM | POA: Diagnosis not present

## 2012-03-14 DIAGNOSIS — R079 Chest pain, unspecified: Secondary | ICD-10-CM | POA: Diagnosis not present

## 2012-03-14 DIAGNOSIS — E119 Type 2 diabetes mellitus without complications: Secondary | ICD-10-CM | POA: Diagnosis not present

## 2012-03-14 DIAGNOSIS — E785 Hyperlipidemia, unspecified: Secondary | ICD-10-CM | POA: Diagnosis not present

## 2012-03-14 DIAGNOSIS — I10 Essential (primary) hypertension: Secondary | ICD-10-CM | POA: Diagnosis not present

## 2012-03-14 DIAGNOSIS — I2510 Atherosclerotic heart disease of native coronary artery without angina pectoris: Secondary | ICD-10-CM | POA: Diagnosis not present

## 2012-03-14 DIAGNOSIS — I252 Old myocardial infarction: Secondary | ICD-10-CM | POA: Diagnosis not present

## 2012-04-03 DIAGNOSIS — B354 Tinea corporis: Secondary | ICD-10-CM | POA: Diagnosis not present

## 2012-04-12 DIAGNOSIS — IMO0002 Reserved for concepts with insufficient information to code with codable children: Secondary | ICD-10-CM | POA: Diagnosis not present

## 2012-06-21 ENCOUNTER — Encounter: Payer: Self-pay | Admitting: Gastroenterology

## 2012-06-29 ENCOUNTER — Encounter: Payer: Self-pay | Admitting: *Deleted

## 2012-07-04 ENCOUNTER — Ambulatory Visit: Payer: Medicare Other | Admitting: Gastroenterology

## 2012-07-04 ENCOUNTER — Telehealth: Payer: Self-pay | Admitting: Gastroenterology

## 2012-07-04 NOTE — Telephone Encounter (Signed)
No charge. 

## 2012-11-17 DIAGNOSIS — E119 Type 2 diabetes mellitus without complications: Secondary | ICD-10-CM | POA: Diagnosis not present

## 2012-11-17 DIAGNOSIS — R109 Unspecified abdominal pain: Secondary | ICD-10-CM | POA: Diagnosis not present

## 2012-11-17 DIAGNOSIS — I10 Essential (primary) hypertension: Secondary | ICD-10-CM | POA: Diagnosis not present

## 2013-04-12 DIAGNOSIS — E119 Type 2 diabetes mellitus without complications: Secondary | ICD-10-CM | POA: Diagnosis not present

## 2013-04-12 DIAGNOSIS — E785 Hyperlipidemia, unspecified: Secondary | ICD-10-CM | POA: Diagnosis not present

## 2013-04-12 DIAGNOSIS — I252 Old myocardial infarction: Secondary | ICD-10-CM | POA: Diagnosis not present

## 2013-04-12 DIAGNOSIS — I10 Essential (primary) hypertension: Secondary | ICD-10-CM | POA: Diagnosis not present

## 2013-04-12 DIAGNOSIS — I2510 Atherosclerotic heart disease of native coronary artery without angina pectoris: Secondary | ICD-10-CM | POA: Diagnosis not present

## 2013-04-12 DIAGNOSIS — R011 Cardiac murmur, unspecified: Secondary | ICD-10-CM | POA: Diagnosis not present

## 2013-05-11 DIAGNOSIS — F411 Generalized anxiety disorder: Secondary | ICD-10-CM | POA: Diagnosis not present

## 2013-05-11 DIAGNOSIS — E119 Type 2 diabetes mellitus without complications: Secondary | ICD-10-CM | POA: Diagnosis not present

## 2013-05-11 DIAGNOSIS — Z136 Encounter for screening for cardiovascular disorders: Secondary | ICD-10-CM | POA: Diagnosis not present

## 2013-05-11 DIAGNOSIS — I10 Essential (primary) hypertension: Secondary | ICD-10-CM | POA: Diagnosis not present

## 2013-05-25 DIAGNOSIS — I2510 Atherosclerotic heart disease of native coronary artery without angina pectoris: Secondary | ICD-10-CM | POA: Diagnosis not present

## 2013-06-08 DIAGNOSIS — Z7902 Long term (current) use of antithrombotics/antiplatelets: Secondary | ICD-10-CM | POA: Diagnosis not present

## 2013-06-08 DIAGNOSIS — I519 Heart disease, unspecified: Secondary | ICD-10-CM | POA: Diagnosis not present

## 2013-06-08 DIAGNOSIS — I252 Old myocardial infarction: Secondary | ICD-10-CM | POA: Diagnosis not present

## 2013-06-08 DIAGNOSIS — Z79899 Other long term (current) drug therapy: Secondary | ICD-10-CM | POA: Diagnosis not present

## 2013-06-08 DIAGNOSIS — Z01818 Encounter for other preprocedural examination: Secondary | ICD-10-CM | POA: Diagnosis not present

## 2013-06-08 DIAGNOSIS — Z7982 Long term (current) use of aspirin: Secondary | ICD-10-CM | POA: Diagnosis not present

## 2013-06-08 DIAGNOSIS — I2789 Other specified pulmonary heart diseases: Secondary | ICD-10-CM | POA: Diagnosis not present

## 2013-06-08 DIAGNOSIS — R011 Cardiac murmur, unspecified: Secondary | ICD-10-CM | POA: Diagnosis not present

## 2013-06-08 DIAGNOSIS — E119 Type 2 diabetes mellitus without complications: Secondary | ICD-10-CM | POA: Diagnosis not present

## 2013-06-08 DIAGNOSIS — I209 Angina pectoris, unspecified: Secondary | ICD-10-CM | POA: Diagnosis not present

## 2013-06-08 DIAGNOSIS — I10 Essential (primary) hypertension: Secondary | ICD-10-CM | POA: Diagnosis not present

## 2013-06-08 DIAGNOSIS — E785 Hyperlipidemia, unspecified: Secondary | ICD-10-CM | POA: Diagnosis not present

## 2013-06-08 DIAGNOSIS — I359 Nonrheumatic aortic valve disorder, unspecified: Secondary | ICD-10-CM | POA: Diagnosis not present

## 2013-06-08 DIAGNOSIS — Z886 Allergy status to analgesic agent status: Secondary | ICD-10-CM | POA: Diagnosis not present

## 2013-06-08 DIAGNOSIS — I2510 Atherosclerotic heart disease of native coronary artery without angina pectoris: Secondary | ICD-10-CM | POA: Diagnosis not present

## 2013-06-11 DIAGNOSIS — Z9861 Coronary angioplasty status: Secondary | ICD-10-CM | POA: Diagnosis not present

## 2013-06-11 DIAGNOSIS — I252 Old myocardial infarction: Secondary | ICD-10-CM | POA: Diagnosis not present

## 2013-06-11 DIAGNOSIS — I2789 Other specified pulmonary heart diseases: Secondary | ICD-10-CM | POA: Diagnosis not present

## 2013-06-11 DIAGNOSIS — I209 Angina pectoris, unspecified: Secondary | ICD-10-CM | POA: Diagnosis not present

## 2013-06-11 DIAGNOSIS — I10 Essential (primary) hypertension: Secondary | ICD-10-CM | POA: Diagnosis not present

## 2013-06-11 DIAGNOSIS — E119 Type 2 diabetes mellitus without complications: Secondary | ICD-10-CM | POA: Diagnosis not present

## 2013-06-11 DIAGNOSIS — I2510 Atherosclerotic heart disease of native coronary artery without angina pectoris: Secondary | ICD-10-CM | POA: Diagnosis not present

## 2013-06-11 DIAGNOSIS — I359 Nonrheumatic aortic valve disorder, unspecified: Secondary | ICD-10-CM | POA: Diagnosis not present

## 2013-06-11 DIAGNOSIS — E785 Hyperlipidemia, unspecified: Secondary | ICD-10-CM | POA: Diagnosis not present

## 2013-06-12 DIAGNOSIS — I10 Essential (primary) hypertension: Secondary | ICD-10-CM | POA: Diagnosis not present

## 2013-06-12 DIAGNOSIS — I2510 Atherosclerotic heart disease of native coronary artery without angina pectoris: Secondary | ICD-10-CM | POA: Diagnosis not present

## 2013-06-12 DIAGNOSIS — Z9861 Coronary angioplasty status: Secondary | ICD-10-CM | POA: Diagnosis not present

## 2013-06-12 DIAGNOSIS — I359 Nonrheumatic aortic valve disorder, unspecified: Secondary | ICD-10-CM | POA: Diagnosis not present

## 2013-06-12 DIAGNOSIS — I209 Angina pectoris, unspecified: Secondary | ICD-10-CM | POA: Diagnosis not present

## 2013-06-12 DIAGNOSIS — I2789 Other specified pulmonary heart diseases: Secondary | ICD-10-CM | POA: Diagnosis not present

## 2013-06-12 DIAGNOSIS — I252 Old myocardial infarction: Secondary | ICD-10-CM | POA: Diagnosis not present

## 2013-07-04 DIAGNOSIS — E785 Hyperlipidemia, unspecified: Secondary | ICD-10-CM | POA: Diagnosis not present

## 2013-07-04 DIAGNOSIS — I252 Old myocardial infarction: Secondary | ICD-10-CM | POA: Diagnosis not present

## 2013-07-04 DIAGNOSIS — E119 Type 2 diabetes mellitus without complications: Secondary | ICD-10-CM | POA: Diagnosis not present

## 2013-07-04 DIAGNOSIS — I2510 Atherosclerotic heart disease of native coronary artery without angina pectoris: Secondary | ICD-10-CM | POA: Diagnosis not present

## 2013-07-04 DIAGNOSIS — I10 Essential (primary) hypertension: Secondary | ICD-10-CM | POA: Diagnosis not present

## 2013-07-12 DIAGNOSIS — I2510 Atherosclerotic heart disease of native coronary artery without angina pectoris: Secondary | ICD-10-CM | POA: Diagnosis not present

## 2013-07-12 DIAGNOSIS — I10 Essential (primary) hypertension: Secondary | ICD-10-CM | POA: Diagnosis not present

## 2013-07-12 DIAGNOSIS — E119 Type 2 diabetes mellitus without complications: Secondary | ICD-10-CM | POA: Diagnosis not present

## 2013-07-20 DIAGNOSIS — S0003XA Contusion of scalp, initial encounter: Secondary | ICD-10-CM | POA: Diagnosis not present

## 2013-07-20 DIAGNOSIS — E119 Type 2 diabetes mellitus without complications: Secondary | ICD-10-CM | POA: Diagnosis not present

## 2013-07-20 DIAGNOSIS — Z7901 Long term (current) use of anticoagulants: Secondary | ICD-10-CM | POA: Diagnosis not present

## 2013-07-20 DIAGNOSIS — R4789 Other speech disturbances: Secondary | ICD-10-CM | POA: Diagnosis not present

## 2013-07-20 DIAGNOSIS — R296 Repeated falls: Secondary | ICD-10-CM | POA: Diagnosis not present

## 2013-07-20 DIAGNOSIS — D72829 Elevated white blood cell count, unspecified: Secondary | ICD-10-CM | POA: Diagnosis not present

## 2013-07-20 DIAGNOSIS — S298XXA Other specified injuries of thorax, initial encounter: Secondary | ICD-10-CM | POA: Diagnosis not present

## 2013-07-20 DIAGNOSIS — S1093XA Contusion of unspecified part of neck, initial encounter: Secondary | ICD-10-CM | POA: Diagnosis not present

## 2013-07-20 DIAGNOSIS — IMO0002 Reserved for concepts with insufficient information to code with codable children: Secondary | ICD-10-CM | POA: Diagnosis not present

## 2013-07-30 DIAGNOSIS — I2510 Atherosclerotic heart disease of native coronary artery without angina pectoris: Secondary | ICD-10-CM | POA: Diagnosis not present

## 2013-07-30 DIAGNOSIS — R42 Dizziness and giddiness: Secondary | ICD-10-CM | POA: Diagnosis not present

## 2013-07-30 DIAGNOSIS — G609 Hereditary and idiopathic neuropathy, unspecified: Secondary | ICD-10-CM | POA: Diagnosis not present

## 2013-07-30 DIAGNOSIS — E119 Type 2 diabetes mellitus without complications: Secondary | ICD-10-CM | POA: Diagnosis not present

## 2013-10-03 ENCOUNTER — Ambulatory Visit: Payer: Medicare Other | Admitting: Endocrinology

## 2013-10-31 ENCOUNTER — Ambulatory Visit: Payer: Medicare Other | Admitting: Endocrinology

## 2013-11-07 ENCOUNTER — Encounter: Payer: Self-pay | Admitting: Endocrinology

## 2013-11-07 ENCOUNTER — Ambulatory Visit (INDEPENDENT_AMBULATORY_CARE_PROVIDER_SITE_OTHER): Payer: Medicare Other | Admitting: Endocrinology

## 2013-11-07 VITALS — BP 113/67 | HR 78 | Temp 97.8°F | Ht 60.0 in | Wt 238.0 lb

## 2013-11-07 DIAGNOSIS — E1059 Type 1 diabetes mellitus with other circulatory complications: Secondary | ICD-10-CM

## 2013-11-07 DIAGNOSIS — E1029 Type 1 diabetes mellitus with other diabetic kidney complication: Secondary | ICD-10-CM | POA: Insufficient documentation

## 2013-11-07 MED ORDER — INSULIN REGULAR HUMAN 100 UNIT/ML IJ SOLN: 15.0000 [IU] | Freq: Three times a day (TID) | INTRAMUSCULAR | Status: DC

## 2013-11-07 MED ORDER — "INSULIN SYRINGE-NEEDLE U-100 31G X 15/64"" 0.5 ML MISC": 1.0000 | Freq: Three times a day (TID) | Status: DC

## 2013-11-07 MED ORDER — INSULIN NPH (HUMAN) (ISOPHANE) 100 UNIT/ML ~~LOC~~ SUSP: 20.0000 [IU] | Freq: Every day | SUBCUTANEOUS | Status: DC

## 2013-11-07 NOTE — Patient Instructions (Addendum)
good diet and exercise habits significanly improve the control of your diabetes.  please let me know if you wish to be referred to a dietician.  high blood sugar is very risky to your health.  you should see an eye doctor and dentist every year.  It is very important to get all recommended vaccinations.  controlling your blood pressure and cholesterol drastically reduces the damage diabetes does to your body.  Those who smoke should quit.  please discuss these with your doctor.  check your blood sugar twice a day.  vary the time of day when you check, between before the 3 meals, and at bedtime.  also check if you have symptoms of your blood sugar being too high or too low.  please keep a record of the readings and bring it to your next appointment here.  You can write it on any piece of paper.  please call us sooner if your blood sugar goes below 70, or if you have a lot of readings over 200. we will need to take this complex situation in stages For now:  Please change the levemir to NPH, 20 units at bedtime, and:  Start regular insulin, 15 units 3 times a day (just before each meal).  Please come back for a follow-up appointment in 1 week.

## 2013-11-07 NOTE — Progress Notes (Signed)
Subjective:    Patient ID: [name], female    DOB: 1945/08/06, 68 y.o.   MRN: [id number]  HPI pt states DM was dx'ed in 2000; he has mild neuropathy of all 4 extremities; and associated autonomic neuropathy, CAD and TIA; he has been on insulin since 2010; pt says her diet and exercise are not good; he has never had GDM, pancreatitis, severe hypoglycemia or DKA.  She takes levemir bid.  She says cbg's vary from 200-394.  It is in general higher as the day goes on.   Past Medical History  Diagnosis Date  . Syncope and collapse   . Seizure disorder   . Diabetes mellitus     insulin dependent  . Hyperlipidemia   . Hypertension   . Morbid obesity   . Depression   . Anxiety   . GERD (gastroesophageal reflux disease)   . Knee pain   . Coronary artery disease   . Fatigue   . SOB (shortness of breath)   . Orthopnea   . Edema of lower extremity   . Nonproductive cough     chronic  . Hiatal hernia 2008    EGD  . Esophageal stricture 2008    EGD    Past Surgical History  Procedure Laterality Date  . Knee arthroscopy    . Transthoracic echocardiogram      showed ef of 65% with no regional wall motion abnormalities  . Cardiac catheterization  03/28/2009    History   Social History  . Marital Status: Married    Spouse Name: N/A    Number of Children: N/A  . Years of Education: N/A   Occupational History  . Not on file.   Social History Main Topics  . Smoking status: Former Smoker    Quit date: 06/02/1970  . Smokeless tobacco: Not on file  . Alcohol Use: No  . Drug Use: No  . Sexual Activity: Not on file   Other Topics Concern  . Not on file   Social History Narrative    Current Outpatient Prescriptions on File Prior to Visit  Medication Sig Dispense Refill  . aspirin EC 81 MG tablet Take 81 mg by mouth 2 (two) times daily.    Marland Kitchen levETIRAcetam (KEPPRA) 500 MG tablet Take 500 mg by mouth every 12 (twelve) hours. 4 tablets po in the am, and 2 tablets po in the  pm    . rosuvastatin (CRESTOR) 20 MG tablet Take 20 mg by mouth daily.    . sertraline (ZOLOFT) 100 MG tablet Take 100 mg by mouth daily.      . valsartan (DIOVAN) 40 MG tablet Take 40 mg by mouth 2 (two) times daily.       No current facility-administered medications on file prior to visit.    No Known Allergies  Family History  Problem Relation Age of Onset  . Heart attack Mother   . Hypertension Mother   . COPD Father   . Heart disease Sister   . Diabetes Brother   . Diabetes Brother     BP 113/67 mmHg  Pulse 78  Temp(Src) 97.8 F (36.6 C) (Oral)  Ht 5' (1.524 m)  Wt 238 lb (107.956 kg)  BMI 46.48 kg/m2  SpO2 98%  Review of Systems denies blurry vision, headache, chest pain, sob, urinary frequency, muscle cramps, cold intolerance, and rhinorrhea.  She has weight gain, night sweats, easy bruising, chronic n/v, and anxiety    Objective:   Physical Exam  VS: see vs page GEN: no distress.  Morbid obesity HEAD: head: no deformity eyes: no periorbital swelling, no proptosis external nose and ears are normal mouth: no lesion seen NECK: supple, thyroid is not enlarged CHEST WALL: no deformity LUNGS:  Clear to auscultation.  CV: reg rate and rhythm; systolic murmur is noted.   ABD: abdomen is soft, nontender.  no hepatosplenomegaly.  not distended.  no hernia MUSCULOSKELETAL: muscle bulk and strength are grossly normal.  no obvious joint swelling.  gait is normal and steady EXTEMITIES: no deformity.  no ulcer on the feet.  feet are of normal color and temp.  no edema PULSES: dorsalis pedis intact bilat.  no carotid bruit NEURO:  cn 2-12 grossly intact.   readily moves all 4's.  sensation is intact to touch on the feet SKIN:  Normal texture and temperature.  No rash or suspicious lesion is visible.   NODES:  None palpable at the neck PSYCH: alert, well-oriented.  Does not appear anxious nor depressed.   i have reviewed the following old records: Office  notes  Labs:a1c=11.5  i reviewed electrocardiogram (01/24/12)    Assessment & Plan:  DM: new.  She needs increased rx.  She also requests a cheaper insulin.   Patient is advised the following: Patient Instructions  good diet and exercise habits significanly improve the control of your diabetes.  please let me know if you wish to be referred to a dietician.  high blood sugar is very risky to your health.  you should see an eye doctor and dentist every year.  It is very important to get all recommended vaccinations.  controlling your blood pressure and cholesterol drastically reduces the damage diabetes does to your body.  Those who smoke should quit.  please discuss these with your doctor.  check your blood sugar twice a day.  vary the time of day when you check, between before the 3 meals, and at bedtime.  also check if you have symptoms of your blood sugar being too high or too low.  please keep a record of the readings and bring it to your next appointment here.  You can write it on any piece of paper.  please call us sooner if your blood sugar goes below 70, or if you have a lot of readings over 200. we will need to take this complex situation in stages For now:  Please change the levemir to NPH, 20 units at bedtime, and:  Start regular insulin, 15 units 3 times a day (just before each meal).  Please come back for a follow-up appointment in 1 week.

## 2013-11-08 ENCOUNTER — Telehealth: Payer: Self-pay | Admitting: Endocrinology

## 2013-11-12 NOTE — Telephone Encounter (Signed)
Patient husband has questions about wife insulin, why insurance company will pay. please advise

## 2013-11-12 NOTE — Telephone Encounter (Signed)
Called pt. She states that she has some questions about the V-go pump. Pt was advised that since she has a follow up appointment on 11/18 to keep that appointment and it could be discussed then. Pt states she is doing well with her injections and she hasn't had any issues. Just wanted you to be advised.

## 2013-11-21 ENCOUNTER — Ambulatory Visit: Payer: Medicare Other | Admitting: Endocrinology

## 2013-11-23 ENCOUNTER — Ambulatory Visit: Payer: Medicare Other | Admitting: Endocrinology

## 2013-12-01 DIAGNOSIS — F329 Major depressive disorder, single episode, unspecified: Secondary | ICD-10-CM | POA: Diagnosis not present

## 2013-12-01 DIAGNOSIS — F419 Anxiety disorder, unspecified: Secondary | ICD-10-CM | POA: Diagnosis not present

## 2014-02-27 DIAGNOSIS — E119 Type 2 diabetes mellitus without complications: Secondary | ICD-10-CM | POA: Diagnosis not present

## 2014-02-27 DIAGNOSIS — I2510 Atherosclerotic heart disease of native coronary artery without angina pectoris: Secondary | ICD-10-CM | POA: Diagnosis not present

## 2014-02-27 DIAGNOSIS — F419 Anxiety disorder, unspecified: Secondary | ICD-10-CM | POA: Diagnosis not present

## 2014-02-27 DIAGNOSIS — I10 Essential (primary) hypertension: Secondary | ICD-10-CM | POA: Diagnosis not present

## 2014-02-27 DIAGNOSIS — F329 Major depressive disorder, single episode, unspecified: Secondary | ICD-10-CM | POA: Diagnosis not present

## 2014-02-27 DIAGNOSIS — R251 Tremor, unspecified: Secondary | ICD-10-CM | POA: Diagnosis not present

## 2014-03-27 ENCOUNTER — Encounter: Payer: Self-pay | Admitting: Neurology

## 2014-03-27 ENCOUNTER — Ambulatory Visit (INDEPENDENT_AMBULATORY_CARE_PROVIDER_SITE_OTHER): Payer: Medicare Other | Admitting: Neurology

## 2014-03-27 VITALS — BP 142/74 | HR 77 | Ht 60.0 in | Wt 245.0 lb

## 2014-03-27 DIAGNOSIS — G250 Essential tremor: Secondary | ICD-10-CM

## 2014-03-27 DIAGNOSIS — R269 Unspecified abnormalities of gait and mobility: Secondary | ICD-10-CM | POA: Diagnosis not present

## 2014-03-27 DIAGNOSIS — E1140 Type 2 diabetes mellitus with diabetic neuropathy, unspecified: Secondary | ICD-10-CM | POA: Diagnosis not present

## 2014-03-27 DIAGNOSIS — R569 Unspecified convulsions: Secondary | ICD-10-CM | POA: Diagnosis not present

## 2014-03-27 NOTE — Progress Notes (Signed)
PATIENT: [name] DOB: 27-Apr-1945  HISTORICAL  [name] is a 69 years old left-handed female, referred by her primary care physician Dr. Everardo Beals for evaluation of bilateral hands shaking  She has history of diabetes, insulin-dependent, most recent A1c February 28 2014 was 10.1, also with past medical history of seizure,  hypertension, hyperlipidemia, CAD, s/p stent, on effient, obesity, gait difficulty  She presented with bilateral hands tremor since 2010, initially noticed bilateral hands shaking, when holding objects, getting worse over the past few years, no head titubation, she ambulate with a cane since 2013 due to right knee pain, obesity, mild bilateral feet paresthesia, lightheadedness when getting up quickly from seated position, this could due to diabetic peripheral neuropathy, orthostatic blood pressure changes, and deconditioning.  She denies loss sense of smell, chronic insomnia, excessive movement during sleep, no family history of tremor. She has a history of seizure since age 89, no warning signs, generalized tonic-clonic seizure, last one was 4, currently taking Keppra 1000 mg twice a day  REVIEW OF SYSTEMS: Full 14 system review of systems performed and notable only for fever, chest pain, ringing in ear, spinning sensation, blurred vision, short Ms. of breath, wheezing, snoring, diarrhea, constipation, incontinence, easy bruising, needing for note, feeling hot, cold, increased thirst, flushing, joints pain, swelling, achy muscles, headaches, weakness, slurred speech, dizziness, seizure, passing out, tremor, insomnia, sleepiness, depression, anxiety, not enough sleep, decreased energy, disinterested in activities  ALLERGIES: No Known Allergies  HOME MEDICATIONS: Current Outpatient Prescriptions  Medication Sig Dispense Refill  . aspirin EC 81 MG tablet Take 81 mg by mouth 2 (two) times daily.    . insulin NPH Human (HUMULIN N,NOVOLIN N) 100 UNIT/ML  injection Inject 0.2 mLs (20 Units total) into the skin at bedtime. 10 mL 11  . insulin regular (NOVOLIN R,HUMULIN R) 100 units/mL injection Inject 0.15 mLs (15 Units total) into the skin 3 (three) times daily before meals. 20 mL 11  . Insulin Syringe-Needle U-100 31G X 15/64" 0.5 ML MISC 1 Device by Does not apply route 3 (three) times daily with meals. 100 each 11  . levETIRAcetam (KEPPRA) 500 MG tablet Take 500 mg by mouth every 12 (twelve) hours. 4 tablets po in the am, and 2 tablets po in the pm    . metoprolol succinate (TOPROL-XL) 50 MG 24 hr tablet Take 50 mg by mouth daily. Take with or immediately following a meal.    . prasugrel (EFFIENT) 10 MG TABS tablet Take 10 mg by mouth daily.    . rosuvastatin (CRESTOR) 20 MG tablet Take 20 mg by mouth daily.    . sertraline (ZOLOFT) 100 MG tablet Take 100 mg by mouth daily.      . valsartan (DIOVAN) 40 MG tablet Take 40 mg by mouth 2 (two) times daily.       No current facility-administered medications for this visit.    PAST MEDICAL HISTORY: Past Medical History  Diagnosis Date  . Syncope and collapse   . Seizure disorder   . Diabetes mellitus     insulin dependent  . Hyperlipidemia   . Hypertension   . Morbid obesity   . Depression   . Anxiety   . GERD (gastroesophageal reflux disease)   . Knee pain   . Coronary artery disease   . Fatigue   . SOB (shortness of breath)   . Orthopnea   . Edema of lower extremity   . Nonproductive cough     chronic  .  Hiatal hernia 2008    EGD  . Esophageal stricture 2008    EGD  . Tremor     PAST SURGICAL HISTORY: Past Surgical History  Procedure Laterality Date  . Knee arthroscopy    . Transthoracic echocardiogram      showed ef of 65% with no regional wall motion abnormalities  . Cardiac catheterization  03/28/2009  . Abdominal hysterectomy      FAMILY HISTORY: Family History  Problem Relation Age of Onset  . Heart attack Mother   . Hypertension Mother   . COPD Father   .  Heart disease Sister   . Diabetes Brother   . Diabetes Brother   . Prostate cancer Brother   . Kidney disease Brother     SOCIAL HISTORY:  History   Social History  . Marital Status: Married    Spouse Name: N/A  . Number of Children: 2  . Years of Education: 12   Occupational History  . Retired    Social History Main Topics  . Smoking status: Former Smoker    Quit date: 06/02/1970  . Smokeless tobacco: Not on file  . Alcohol Use: No  . Drug Use: No  . Sexual Activity: Not on file   Other Topics Concern  . Not on file   Social History Narrative   Lives at home with her husband.   Left-handed.   No caffeine use.     PHYSICAL EXAM   Filed Vitals:   03/27/14 1315  BP: 142/74  Pulse: 77  Height: 5' (1.524 m)  Weight: 245 lb (111.131 kg)    Not recorded      Body mass index is 47.85 kg/(m^2).  PHYSICAL EXAMNIATION:  Gen: NAD, conversant, well nourised, obese, well groomed                     Cardiovascular: Regular rate rhythm, no peripheral edema, warm, nontender. Eyes: Conjunctivae clear without exudates or hemorrhage Neck: Supple, no carotid bruise. Pulmonary: Clear to auscultation bilaterally   NEUROLOGICAL EXAM:  MENTAL STATUS: Obese, poor dentation Speech:    Speech is normal; fluent and spontaneous with normal comprehension.  Cognition:    The patient is oriented to person, place, and time;     recent and remote memory intact;     language fluent;     normal attention, concentration,     fund of knowledge.  CRANIAL NERVES: CN II: Visual fields are full to confrontation. Fundoscopic exam is normal with sharp discs and no vascular changes. Venous pulsations are present bilaterally. Pupils are 4 mm and briskly reactive to light. Visual acuity is 20/20 bilaterally. CN III, IV, VI: extraocular movement are normal. No ptosis. CN V: Facial sensation is intact to pinprick in all 3 divisions bilaterally. Corneal responses are intact.  CN VII: Face  is symmetric with normal eye closure and smile. CN VIII: Hearing is normal to rubbing fingers CN IX, X: Palate elevates symmetrically. Phonation is normal. CN XI: Head turning and shoulder shrug are intact CN XII: Tongue is midline with normal movements and no atrophy.  MOTOR: There is no pronator drift of out-stretched arms. Muscle bulk and tone are normal. Muscle strength is normal.   Shoulder abduction Shoulder external rotation Elbow flexion Elbow extension Wrist flexion Wrist extension Finger abduction Hip flexion Knee flexion Knee extension Ankle dorsi flexion Ankle plantar flexion  R 5 5 5 5 5 5 5 5 5 5 5 5   L 5 5 5  5 5 5 5 5 5 5 5 5     REFLEXES: Reflexes are 2+ and symmetric at the biceps, triceps, knees, and ankles. Plantar responses are flexor.  SENSORY: Light touch, pinprick, position sense, and vibration sense are intact in fingers and toes.  COORDINATION: Rapid alternating movements and fine finger movements are intact. There is no dysmetria on finger-to-nose and heel-knee-shin. There are no abnormal or extraneous movements.   GAIT/STANCE: Need to push up to get up from seated position, wide-based, cautious, antalgic gait, using cane,   DIAGNOSTIC DATA (LABS, IMAGING, TESTING) - I reviewed patient records, labs, notes, testing and imaging myself where available.  Lab Results  Component Value Date   WBC 9.7 01/24/2012   HGB 12.3 01/24/2012   HCT 36.0 01/24/2012   MCV 90.5 01/24/2012   PLT 323 01/24/2012      Component Value Date/Time   NA 138 01/24/2012 0532   K 3.8 01/24/2012 0532   CL 102 01/24/2012 0532   CO2 23 01/24/2012 0532   GLUCOSE 277* 01/24/2012 0532   BUN 15 01/24/2012 0532   CREATININE 0.57 01/24/2012 0532   CALCIUM 9.2 01/24/2012 0532   PROT 7.2 05/29/2010 0535   ALBUMIN 3.1* 05/29/2010 0535   AST 17 05/29/2010 0535   ALT 13 05/29/2010 0535   ALKPHOS 84 05/29/2010 0535   BILITOT 0.2* 05/29/2010 0535   GFRNONAA >90 01/24/2012 0532    GFRAA >90 01/24/2012 0532   Lab Results  Component Value Date   CHOL 167 05/29/2010   HDL 39* 05/29/2010   LDLCALC  05/29/2010    96        Total Cholesterol/HDL:CHD Risk Coronary Heart Disease Risk Table                     Men   Women  1/2 Average Risk   3.4   3.3  Average Risk       5.0   4.4  2 X Average Risk   9.6   7.1  3 X Average Risk  23.4   11.0        Use the calculated Patient Ratio above and the CHD Risk Table to determine the patient's CHD Risk.        ATP III CLASSIFICATION (LDL):  <100     mg/dL   Optimal  100-129  mg/dL   Near or Above                    Optimal  130-159  mg/dL   Borderline  160-189  mg/dL   High  >190     mg/dL   Very High   TRIG 161* 05/29/2010   CHOLHDL 4.3 05/29/2010   Lab Results  Component Value Date   HGBA1C * 05/28/2010    15.3 (NOTE)                                                                       According to the ADA Clinical Practice Recommendations for 2011, when HbA1c is used as a screening test:   >=6.5%   Diagnostic of Diabetes Mellitus           (if abnormal result  is confirmed)  5.7-6.4%  Increased risk of developing Diabetes Mellitus  References:Diagnosis and Classification of Diabetes Mellitus,Diabetes [id number] 1):S62-S69 and Standards of Medical Care in         Diabetes - 2011,Diabetes [id number]  (Suppl 1):S11-S61.   Lab Results  Component Value Date   [id number] 04/19/2006   Lab Results  Component Value Date   TSH 2.063 05/28/2010      ASSESSMENT AND PLAN  [name] is a 69 y.o. female  with past medical history of poorly controlled diabetes, A1c 10.1 in February 2016, coronary artery disease, hypertension, hyperlipidemia,  1. Seizure, taking keppra 1000mg  bid, last seizure in 2015, no warning signs, GTC. 2. Migraine, cambia prn. 3. Essential Tremor,  Check TSH by her PCP. 4. RTC in 6 months with Rhae Hammock, M.D. Ph.D.  [facility] [address],  [place], [place] [address] Ph: [phone] Fax: [phone]

## 2014-04-24 DIAGNOSIS — R062 Wheezing: Secondary | ICD-10-CM | POA: Diagnosis not present

## 2014-04-24 DIAGNOSIS — R0600 Dyspnea, unspecified: Secondary | ICD-10-CM | POA: Diagnosis not present

## 2014-05-31 ENCOUNTER — Telehealth: Payer: Self-pay | Admitting: Endocrinology

## 2014-05-31 NOTE — Telephone Encounter (Signed)
Patients husband called and wanted to discuss insulin injection sites. I instructed the husband the insulin is given in the stomach, the upper thigh and with assistance in the back of the arm. Patient had stated the the husband she thought the insulin was injected into the tip of the fingers. I advised the patients husband insulin is never injected into the finger tips, but blood sugar is checked from pricking the finger. Pt's husband voiced understanding. I advised the husband to have the patient call our office back to scheduled her follow up visit with Dr. Loanne Drilling.

## 2014-08-04 ENCOUNTER — Emergency Department (HOSPITAL_COMMUNITY): Payer: Medicare Other

## 2014-08-04 ENCOUNTER — Encounter (HOSPITAL_COMMUNITY): Payer: Self-pay | Admitting: Emergency Medicine

## 2014-08-04 ENCOUNTER — Emergency Department (HOSPITAL_COMMUNITY)
Admission: EM | Admit: 2014-08-04 | Discharge: 2014-08-04 | Discharge status: Against Medical Advice | Payer: Medicare Other | Attending: Emergency Medicine | Admitting: Emergency Medicine

## 2014-08-04 DIAGNOSIS — Z87891 Personal history of nicotine dependence: Secondary | ICD-10-CM | POA: Insufficient documentation

## 2014-08-04 DIAGNOSIS — F329 Major depressive disorder, single episode, unspecified: Secondary | ICD-10-CM | POA: Insufficient documentation

## 2014-08-04 DIAGNOSIS — Z7982 Long term (current) use of aspirin: Secondary | ICD-10-CM | POA: Diagnosis not present

## 2014-08-04 DIAGNOSIS — Z79899 Other long term (current) drug therapy: Secondary | ICD-10-CM | POA: Insufficient documentation

## 2014-08-04 DIAGNOSIS — E119 Type 2 diabetes mellitus without complications: Secondary | ICD-10-CM | POA: Diagnosis not present

## 2014-08-04 DIAGNOSIS — I10 Essential (primary) hypertension: Secondary | ICD-10-CM | POA: Diagnosis not present

## 2014-08-04 DIAGNOSIS — R011 Cardiac murmur, unspecified: Secondary | ICD-10-CM | POA: Diagnosis not present

## 2014-08-04 DIAGNOSIS — I214 Non-ST elevation (NSTEMI) myocardial infarction: Secondary | ICD-10-CM | POA: Diagnosis not present

## 2014-08-04 DIAGNOSIS — I2510 Atherosclerotic heart disease of native coronary artery without angina pectoris: Secondary | ICD-10-CM | POA: Diagnosis not present

## 2014-08-04 DIAGNOSIS — E785 Hyperlipidemia, unspecified: Secondary | ICD-10-CM | POA: Diagnosis not present

## 2014-08-04 DIAGNOSIS — M545 Low back pain: Secondary | ICD-10-CM | POA: Insufficient documentation

## 2014-08-04 DIAGNOSIS — R0602 Shortness of breath: Secondary | ICD-10-CM | POA: Diagnosis not present

## 2014-08-04 DIAGNOSIS — F419 Anxiety disorder, unspecified: Secondary | ICD-10-CM | POA: Diagnosis not present

## 2014-08-04 DIAGNOSIS — S098XXA Other specified injuries of head, initial encounter: Secondary | ICD-10-CM | POA: Diagnosis not present

## 2014-08-04 DIAGNOSIS — Z794 Long term (current) use of insulin: Secondary | ICD-10-CM | POA: Diagnosis not present

## 2014-08-04 DIAGNOSIS — G40909 Epilepsy, unspecified, not intractable, without status epilepticus: Secondary | ICD-10-CM | POA: Diagnosis not present

## 2014-08-04 DIAGNOSIS — R079 Chest pain, unspecified: Secondary | ICD-10-CM | POA: Diagnosis not present

## 2014-08-04 HISTORY — DX: Cardiac murmur, unspecified: R01.1

## 2014-08-04 LAB — BASIC METABOLIC PANEL
Anion gap: 11 (ref 5–15)
BUN: 24 mg/dL — AB (ref 6–20)
CO2: 26 mmol/L (ref 22–32)
Calcium: 8.5 mg/dL — ABNORMAL LOW (ref 8.9–10.3)
Chloride: 99 mmol/L — ABNORMAL LOW (ref 101–111)
Creatinine, Ser: 0.78 mg/dL (ref 0.44–1.00)
GLUCOSE: 327 mg/dL — AB (ref 65–99)
Potassium: 3.5 mmol/L (ref 3.5–5.1)
Sodium: 136 mmol/L (ref 135–145)

## 2014-08-04 LAB — I-STAT TROPONIN, ED: TROPONIN I, POC: 0.56 ng/mL — AB (ref 0.00–0.08)

## 2014-08-04 LAB — CBC
HCT: 33.3 % — ABNORMAL LOW (ref 36.0–46.0)
HEMOGLOBIN: 10.6 g/dL — AB (ref 12.0–15.0)
MCH: 30 pg (ref 26.0–34.0)
MCHC: 31.8 g/dL (ref 30.0–36.0)
MCV: 94.3 fL (ref 78.0–100.0)
Platelets: 270 10*3/uL (ref 150–400)
RBC: 3.53 MIL/uL — ABNORMAL LOW (ref 3.87–5.11)
RDW: 15.4 % (ref 11.5–15.5)
WBC: 8.7 10*3/uL (ref 4.0–10.5)

## 2014-08-04 LAB — BRAIN NATRIURETIC PEPTIDE: B Natriuretic Peptide: 485.3 pg/mL — ABNORMAL HIGH (ref 0.0–100.0)

## 2014-08-04 MED ORDER — HEPARIN (PORCINE) IN NACL 100-0.45 UNIT/ML-% IJ SOLN
1000.0000 [IU]/h | INTRAMUSCULAR | Status: DC
  Filled 2014-08-04: qty 250

## 2014-08-04 MED ORDER — HEPARIN BOLUS VIA INFUSION
3000.0000 [IU] | Freq: Once | INTRAVENOUS | Status: DC
  Filled 2014-08-04: qty 3000

## 2014-08-04 NOTE — Discharge Instructions (Signed)
You are having a heart attack. He had decided to leave [place]. We have discussed how serious this condition is and that leaving has possible, occasions such as worse heart attack, heart failure, disability, and death. He verbalizes that you understand these and still want to leave. If you change your mind at any time return to this or any ER for immediate evaluation. Otherwise follow-up immediately with your cardiologist and call them today.   Myocardial Infarction A myocardial infarction (MI) is damage to the heart that is not reversible. It is also called a heart attack. An MI usually occurs when a heart (coronary) artery becomes blocked or narrowed. This cuts off the blood supply to the heart. When one or more of the heart (coronary) arteries becomes blocked, that area of the heart begins to die. This causes pain felt during an MI.  If you think you might be having an MI, call your local emergency services immediately (911 in [place] It is recommended that you chew and swallow 3 non-enteric coated baby aspirin if you do not have an aspirin allergy. Do not drive yourself to the hospital or wait to see if your symptoms go away. The sooner MI is treated, the greater the amount of heart muscle saved. Time is muscle. It can save your life. CAUSES  An MI can occur from:  A gradual buildup of a fatty substance called plaque. When plaque builds up in the arteries, this condition is called atherosclerosis. This buildup can block or reduce the blood supply to the heart artery(s).  A sudden plaque rupture within a heart artery that causes a blood clot (thrombus). A blood clot can block the heart artery which does not allow blood flow to the heart.  A severe tightening (spasm) of the heart artery. This is a less common cause of a heart attack. When a heart artery spasms, it cuts off blood flow through the artery. Spasms can occur in heart arteries that do not have atherosclerosis. RISK  FACTORS People at risk for an MI usually have one or more risk factors, such as:  High blood pressure.  High cholesterol.  Smoking.  Gender. Men have a higher heart attack risk.  Overweight/obesity.  Age.  Family history.  Lack of exercise.  Diabetes.  Stress.  Excessive alcohol use.  Street drug use (cocaine and methamphetamines). SYMPTOMS  MI symptoms can vary, such as:  In both men and women, MI symptoms can include the following:  Chest pain. The chest pain may feel like a crushing, squeezing, or "pressure" type feeling. MI pain can be "referred," meaning pain can be caused in one part of the body but felt in another part of the body. Referred MI pain may occur in the left arm, neck, or jaw. Pain may even be felt in the right arm.  Shortness of breath (dyspnea).  Heartburn or indigestion with or without vomiting, shortness of breath, or sweating (diaphoresis).  Sudden, cold sweats.  Sudden lightheadedness.  Upper back pain.  Women can have unique MI symptoms, such as:  Unexplained feelings of nervousness or anxiety.  Discomfort between the shoulder blades (scapula) or upper back.  Tingling in the hands and arms.  In elderly people (regardless of gender), MI symptoms can be subtle, such as:  Sweating (diaphoresis).  Shortness of breath (dyspnea).  General tiredness (fatigue) or not feeling well (malaise). DIAGNOSIS  Diagnosis of an MI involves several tests such as:  An assessment of your vital signs such as heart  rhythm, blood pressure, respiratory rate, and oxygen level.  An EKG (ECG) to look at the electrical activity of your heart.  Blood tests called cardiac markers are drawn at scheduled times to measure proteins or enzymes released by the damaged heart muscle.  A chest X-ray.  An echocardiogram to evaluate heart motion and blood flow.  Coronary angiography (cardiac catheterization). This is a diagnostic procedure to look at the heart  arteries. TREATMENT  Acute Intervention. For an MI, the national standard in the [place] is to have an acute intervention in under 90 minutes from the time you get to the hospital. An acute intervention is a special procedure to open up the heart arteries. It is done in a treatment room called a "catheterization lab" (cath lab). Some hospitals do no have a cath lab. If you are having an MI and the hospital does not have a cath lab, the standard is to transport you to a hospital that has one. In the cath lab, acute intervention includes:  Angioplasty. An angioplasty involves inserting a thin, flexible tube (catheter) into an artery in either your groin or wrist. The catheter is threaded to the heart arteries. A balloon at the end of the catheter is inflated to open a narrowed or blocked heart artery. During an angioplasty procedure, a small mesh tube (stent) may be used to keep the heart artery open. Depending on your condition and health history, one of two types of stents may be placed:  Drug-eluting stent (DES). A DES is coated with a medicine to prevent scar tissue from growing over the stent. With drug-eluting stents, blood thinning medicine will need to be taken for up to a year.  Bare metal stent. This type of stent has no special coating to keep tissue from growing over it. This type of stent is used if you cannot take blood thinning medicine for a prolonged time or you need surgery in the near future. After a bare metal stent is placed, blood thinning medicine will need to be taken for about a month.  If you are taking blood thinning medicine (anti-platelet therapy) after stent placement, do not stop taking it unless your caregiver says it is okay to do so. Make sure you understand how long you need to take the medicine. Surgical Intervention  If an acute intervention is not successful, surgery may be needed:  Open heart surgery (coronary artery bypass graft, CABG). CABG takes a vein  (saphenous vein) from your leg. The vein is then attached to the blocked heart artery which bypasses the blockage. This then allows blood flow to the heart muscle. Additional Interventions  A "clot buster" medicine (thrombolytic) may be given. This medicine can help break up a clot in the heart artery. This medicine may be given if a person cannot get to a cath lab right away.  Intra-aortic balloon pump (IABP). If you have suffered a very severe MI and are too unstable to go to the cath lab or to surgery, an IABP may be used. This is a temporary mechanical device used to increase blood flow to the heart and reduce the workload of the heart until you are stable enough to go to the cath lab or surgery. HOME CARE INSTRUCTIONS After an MI, you may need the following:  Medicine. Take medicine as directed by your caregiver. Medicines after an MI may:  Keep your blood from clotting easily (blood thinners).  Control your blood pressure.  Help lower your cholesterol.  Control abnormal  heart rhythms.  Lifestyle changes. Under the guidance of your caregiver, lifestyle changes include:  Quitting smoking, if you smoke. Your caregiver can help you quit.  Being physically active.  Maintaining a healthy weight.  Eating a heart healthy diet. A dietitian can help you learn healthy eating options.  Managing diabetes.  Reducing stress.  Limiting alcohol intake. SEEK IMMEDIATE MEDICAL CARE IF:   You have severe chest pain, especially if the pain is crushing or pressure-like and spreads to the arms, back, neck, or jaw. This is an emergency. Do not wait to see if the pain will go away. Get medical help at once. Call your local emergency services (911 in the [place] Do not drive yourself to the hospital.  You have shortness of breath during rest, sleep, or with activity.  You have sudden sweating or clammy skin.  You feel sick to your stomach (nauseous) and throw up (vomit).  You suddenly become  lightheaded or dizzy.  You feel your heart beating rapidly or you notice "skipped" beats. MAKE SURE YOU:   Understand these instructions.  Will watch your condition.  Will get help right away if you are not doing well or get worse. Document Released: 12/21/2004 Document Revised: 12/26/2012 Document Reviewed: 02/23/2013 [facility] Patient Information 2015 [place], [place]. This information is not intended to replace advice given to you by your health care provider. Make sure you discuss any questions you have with your health care provider.

## 2014-08-04 NOTE — ED Notes (Addendum)
MD aware that pt states "i will not stay here". Pt refuses to start heparin at this time. Dr. Harl Bowie with cardiology called and made aware of situation. States that he will be down as soon as possible to speak with pt and family. Pt states that she does not want to be transferred. She states that she want to go home and follow up with her MD tomorrow. Family at bedside as well.

## 2014-08-04 NOTE — ED Notes (Signed)
Per EMS pt from home, while watching tv began having central chest pain that radiates to her back.  She took 2 nitro and is now pain free. Pt did experience some SOB however after using her inhaler the SOB diminished.  She reports she fell two weeks ago, not sure if she had a seizure or just fell but did not receive any medical attention at that time.  She was given 324 ASP in route and maintained stable vitals.

## 2014-08-04 NOTE — Progress Notes (Signed)
ANTICOAGULATION CONSULT NOTE - Initial Consult  Pharmacy Consult for heparin Indication: chest pain/ACS  Allergies  Allergen Reactions  . Codeine Itching    Patient Measurements: Height: 5' (152.4 cm) Weight: 244 lb 14.9 oz (111.1 kg) IBW/kg (Calculated) : 45.5 Heparin Dosing Weight: 75kg  Vital Signs: Temp: 98.4 F (36.9 C) (07/31 0707) BP: 147/74 mmHg (07/31 0801) Pulse Rate: 70 (07/31 0801)   Medical History: Past Medical History  Diagnosis Date  . Syncope and collapse   . Seizure disorder   . Diabetes mellitus     insulin dependent  . Hyperlipidemia   . Hypertension   . Morbid obesity   . Depression   . Anxiety   . GERD (gastroesophageal reflux disease)   . Knee pain   . Coronary artery disease   . Fatigue   . SOB (shortness of breath)   . Orthopnea   . Edema of lower extremity   . Nonproductive cough     chronic  . Hiatal hernia 2008    EGD  . Esophageal stricture 2008    EGD  . Tremor   . Heart murmur      Assessment: 69yo female c/o central CP radiating to back and associated w/ some SOB relieved w/ inhaler, now pain free, i-stat troponin elevated, to begin heparin.  Goal of Therapy:  Heparin level 0.3-0.7 units/ml Monitor platelets by anticoagulation protocol: Yes   Plan:  Will give heparin 3000 units IV bolus x1 followed by gtt at 1000 units/hr and monitor heparin levels and CBC.  Wynona Neat, PharmD, BCPS  08/04/2014,8:15 AM

## 2014-08-04 NOTE — ED Notes (Signed)
MD at bedside discussing results. 

## 2014-08-04 NOTE — Progress Notes (Signed)
Patient seen and discussed with PA Rosita Fire, I agree with her documentation. 69 yo female with hx of CAD with prior RCA stenting, of note the procedure was complicated by the patient falling off the cath table. From 2011 cath she also had LAD 50-6% lesions, LCX 40-50% lesion that were medically managed. She also has a history of DM2, hL, HTN.   She presents with chest pain starting this AM   BNP 485, Hgb 10.6, Plt 270, Cr 0.78, trop 0.56,  CXR mild cardiomegaly, mild congestion CT head no acute process, small left scalp hematoma.  Patient signed out AMA. I spoke with her as she gathered her things. I told her she is having a heart attack and requires medical treatment and that leaving the hospital has risks including possible death. She understands but refuses to stay. Her plan is to see her primary cardiologist tomorrow in [facility]. I once again strongly emphasized the urgency of her health condition and recommended treatment, however she will not stay.  Zandra Abts MD

## 2014-08-04 NOTE — ED Notes (Signed)
MD at bedside. 

## 2014-08-04 NOTE — ED Notes (Signed)
MD at bedside discussing pt wanting to leave

## 2014-08-04 NOTE — ED Provider Notes (Signed)
CSN: [id number]     Arrival date & time 08/04/14  [id number] History   First MD Initiated Contact with Patient 08/04/14 [phone]     Chief Complaint  Patient presents with  . Chest Pain     (Consider location/radiation/quality/duration/timing/severity/associated sxs/prior Treatment) HPI  69 year old female presents with chest pain that started around 5 AM. She was watching TV and all of a sudden developed a pressure and sharp sensation under her sternum. This radiated around her chest to her back. She took 2 nitroglycerin and 4 baby aspirin and her pain completely resolved. She has had shortness of breath the past 1 day or so but that significant worsened when her chest pain developed. She states she did not break out in a sweat, no vomiting, and no arm pain. Patient states pain feels just like the time when she had an MI in 2011 and required stents. Recently she also got more stents by her [facility] cardiologist, Dr. Wyline Copas. Patient denies increased leg swelling. Patient also endorses falling and sustaining ecchymosis to face and forehead 2 weeks ago. Was unwitnessed, thinks she may have had a seizure. No headache, but bruising has continued.  Past Medical History  Diagnosis Date  . Syncope and collapse   . Seizure disorder   . Diabetes mellitus     insulin dependent  . Hyperlipidemia   . Hypertension   . Morbid obesity   . Depression   . Anxiety   . GERD (gastroesophageal reflux disease)   . Knee pain   . Coronary artery disease   . Fatigue   . SOB (shortness of breath)   . Orthopnea   . Edema of lower extremity   . Nonproductive cough     chronic  . Hiatal hernia 2008    EGD  . Esophageal stricture 2008    EGD  . Tremor   . Heart murmur    Past Surgical History  Procedure Laterality Date  . Knee arthroscopy    . Transthoracic echocardiogram      showed ef of 65% with no regional wall motion abnormalities  . Cardiac catheterization  03/28/2009  . Abdominal hysterectomy      Family History  Problem Relation Age of Onset  . Heart attack Mother   . Hypertension Mother   . COPD Father   . Heart disease Sister   . Diabetes Brother   . Diabetes Brother   . Prostate cancer Brother   . Kidney disease Brother    History  Substance Use Topics  . Smoking status: Former Smoker    Quit date: 06/02/1970  . Smokeless tobacco: Not on file  . Alcohol Use: No   OB History    No data available     Review of Systems  Constitutional: Negative for diaphoresis.  Respiratory: Positive for shortness of breath.   Cardiovascular: Positive for chest pain and leg swelling (chronic).  Gastrointestinal: Negative for nausea and vomiting.  Musculoskeletal: Positive for back pain.  All other systems reviewed and are negative.     Allergies  Codeine  Home Medications   Prior to Admission medications   Medication Sig Start Date End Date Taking? Authorizing Provider  ALPRAZolam (XANAX PO) Take by mouth as needed.    Historical Provider, MD  aspirin EC 81 MG tablet Take 81 mg by mouth 2 (two) times daily.    Historical Provider, MD  HYDROCHLOROTHIAZIDE PO Take 20 mg by mouth daily.    Historical Provider, MD  insulin NPH Human (  HUMULIN N,NOVOLIN N) 100 UNIT/ML injection Inject 0.2 mLs (20 Units total) into the skin at bedtime. 11/07/13   Renato Shin, MD  insulin regular (NOVOLIN R,HUMULIN R) 100 units/mL injection Inject 0.15 mLs (15 Units total) into the skin 3 (three) times daily before meals. 11/07/13   Renato Shin, MD  Insulin Syringe-Needle U-100 31G X 15/64" 0.5 ML MISC 1 Device by Does not apply route 3 (three) times daily with meals. 11/07/13   Renato Shin, MD  levETIRAcetam (KEPPRA) 1000 MG tablet Take 1,000 mg by mouth 2 (two) times daily. 02/20/14   Historical Provider, MD  metoprolol succinate (TOPROL-XL) 50 MG 24 hr tablet Take 50 mg by mouth daily. Take with or immediately following a meal.    Historical Provider, MD  nitroGLYCERIN (NITROSTAT) 0.4 MG SL  tablet Place 0.4 mg under the tongue as needed for chest pain.    Historical Provider, MD  potassium chloride SA (K-DUR,KLOR-CON) 20 MEQ tablet Take 20 mEq by mouth 2 (two) times daily.    Historical Provider, MD  prasugrel (EFFIENT) 10 MG TABS tablet Take 10 mg by mouth daily.    Historical Provider, MD  rosuvastatin (CRESTOR) 20 MG tablet Take 20 mg by mouth daily.    Historical Provider, MD  sertraline (ZOLOFT) 50 MG tablet Take 50 mg by mouth. Take 3 daily. 02/27/14   Historical Provider, MD  valsartan (DIOVAN) 160 MG tablet Take 160 mg by mouth daily. 02/27/14   Historical Provider, MD   BP 123/67 mmHg  Pulse 76  Temp(Src) 98.4 F (36.9 C)  Resp 17  SpO2 97% Physical Exam  Constitutional: She is oriented to person, place, and time. She appears well-developed and well-nourished.  Morbidly obese  HENT:  Head: Normocephalic. Head is with contusion.    Right Ear: External ear normal.  Left Ear: External ear normal.  Nose: Nose normal.  Eyes: Right eye exhibits no discharge. Left eye exhibits no discharge.  Cardiovascular: Normal rate, regular rhythm and normal heart sounds.   Pulmonary/Chest: Effort normal and breath sounds normal. She has no wheezes. She has no rales.  Abdominal: Soft. There is no tenderness.  Musculoskeletal: She exhibits no edema.  Neurological: She is alert and oriented to person, place, and time.  Skin: Skin is warm and dry.  Nursing note and vitals reviewed.   ED Course  Procedures (including critical care time) Labs Review Labs Reviewed  BASIC METABOLIC PANEL - Abnormal; Notable for the following:    Chloride 99 (*)    Glucose, Bld 327 (*)    BUN 24 (*)    Calcium 8.5 (*)    All other components within normal limits  CBC - Abnormal; Notable for the following:    RBC 3.53 (*)    Hemoglobin 10.6 (*)    HCT 33.3 (*)    All other components within normal limits  BRAIN NATRIURETIC PEPTIDE - Abnormal; Notable for the following:    B Natriuretic  Peptide 485.3 (*)    All other components within normal limits  I-STAT TROPOININ, ED - Abnormal; Notable for the following:    Troponin i, poc 0.56 (*)    All other components within normal limits  HEPARIN LEVEL (UNFRACTIONATED)    Imaging Review Dg Chest 2 View  08/04/2014   CLINICAL DATA:  Acute chest pain today with shortness of breath.  EXAM: CHEST  2 VIEW  COMPARISON:  01/24/2012  FINDINGS: Mild cardiomegaly noted.  Mild pulmonary vascular congestion is present.  The may be  a trace amount of pleural fluid in the right minor fissure.  There is no evidence of pneumothorax, airspace disease or consolidation.  IMPRESSION: Mild cardiomegaly with pulmonary vascular congestion and trace right pleural fluid.   Electronically Signed   By: Margarette Canada M.D.   On: 08/04/2014 07:39   Ct Head Wo Contrast  08/04/2014   CLINICAL DATA:  Fall 2 weeks ago with seizure.  Bruising to face  EXAM: CT HEAD WITHOUT CONTRAST  TECHNIQUE: Contiguous axial images were obtained from the base of the skull through the vertex without intravenous contrast.  COMPARISON:  CT 05/28/2010  FINDINGS: No acute intracranial hemorrhage. No focal mass lesion. No CT evidence of acute infarction. No midline shift or mass effect. No hydrocephalus. Basilar cisterns are patent.  There is mild periventricular white matter hypodensities.  There is a small scalp hematoma over the LEFT frontal bone. No skull fracture.  Paranasal sinuses and  mastoid air cells are clear.  IMPRESSION: 1. No intracranial trauma. 2. Small LEFT frontal scalp hematoma. 3. Mild white matter microvascular disease.   Electronically Signed   By: Suzy Bouchard M.D.   On: 08/04/2014 09:15     EKG Interpretation   Date/Time:  Sunday August 04 2014 07:00:21 EDT Ventricular Rate:  75 PR Interval:  161 QRS Duration: 101 QT Interval:  418 QTC Calculation: 467 R Axis:   55 Text Interpretation:  Sinus rhythm Borderline repolarization abnormality  changes noted since 2014  Confirmed by Wyonia Fontanella  MD, Marcell[place] ([id number]) on  08/04/2014 7:05:42 AM        MDM   Final diagnoses:  NSTEMI (non-ST elevated myocardial infarction)    Patient's workup is consistent with an NSTEMI. Patient has remained pain-free for several hours. CT head obtained to rule out intracranial injury given her significant bruising on a blood thinner prior to starting heparin. She was given aspirin prior to arrival. She remains pain-free she has large concerns about staying in this hospital. 4 years ago when she was getting a cath she states she fell off the table and does not trust this hospital. I have offered to send her to [facility] where her cardiologist is but at this point she just states she wants to go home. She is refusing heparin or any other treatment. She understands that she is having a myocardial infarction and that if she goes home she may have worse heart attack, heart failure, or death. Most of her choice appears to be due to not wanting to stay at this hospital but even when offered a transfer she still wants to leave. She is capable of making medical decisions for herself. I have discussed that she can come back at any point back to the ER for treatment and she verbalizes understanding.    Sherwood Gambler, MD 08/04/14 [phone]

## 2014-08-28 ENCOUNTER — Encounter (HOSPITAL_BASED_OUTPATIENT_CLINIC_OR_DEPARTMENT_OTHER): Payer: Self-pay | Admitting: *Deleted

## 2014-08-28 ENCOUNTER — Emergency Department (HOSPITAL_BASED_OUTPATIENT_CLINIC_OR_DEPARTMENT_OTHER): Payer: Medicare Other

## 2014-08-28 ENCOUNTER — Inpatient Hospital Stay (HOSPITAL_BASED_OUTPATIENT_CLINIC_OR_DEPARTMENT_OTHER)
Admission: EM | Admit: 2014-08-28 | Discharge: 2014-09-02 | DRG: 563 | Disposition: A | Discharge status: Skilled Nursing Facility | Payer: Medicare Other | Attending: Internal Medicine | Admitting: Internal Medicine

## 2014-08-28 DIAGNOSIS — R748 Abnormal levels of other serum enzymes: Secondary | ICD-10-CM | POA: Diagnosis not present

## 2014-08-28 DIAGNOSIS — I214 Non-ST elevation (NSTEMI) myocardial infarction: Secondary | ICD-10-CM

## 2014-08-28 DIAGNOSIS — E1159 Type 2 diabetes mellitus with other circulatory complications: Secondary | ICD-10-CM | POA: Diagnosis present

## 2014-08-28 DIAGNOSIS — F329 Major depressive disorder, single episode, unspecified: Secondary | ICD-10-CM | POA: Diagnosis present

## 2014-08-28 DIAGNOSIS — E1029 Type 1 diabetes mellitus with other diabetic kidney complication: Secondary | ICD-10-CM

## 2014-08-28 DIAGNOSIS — E785 Hyperlipidemia, unspecified: Secondary | ICD-10-CM | POA: Diagnosis present

## 2014-08-28 DIAGNOSIS — K589 Irritable bowel syndrome without diarrhea: Secondary | ICD-10-CM

## 2014-08-28 DIAGNOSIS — Z87891 Personal history of nicotine dependence: Secondary | ICD-10-CM | POA: Diagnosis not present

## 2014-08-28 DIAGNOSIS — S8991XA Unspecified injury of right lower leg, initial encounter: Secondary | ICD-10-CM | POA: Diagnosis not present

## 2014-08-28 DIAGNOSIS — M6281 Muscle weakness (generalized): Secondary | ICD-10-CM | POA: Diagnosis not present

## 2014-08-28 DIAGNOSIS — I10 Essential (primary) hypertension: Secondary | ICD-10-CM | POA: Diagnosis not present

## 2014-08-28 DIAGNOSIS — E039 Hypothyroidism, unspecified: Secondary | ICD-10-CM | POA: Diagnosis present

## 2014-08-28 DIAGNOSIS — M25561 Pain in right knee: Secondary | ICD-10-CM | POA: Diagnosis not present

## 2014-08-28 DIAGNOSIS — Z95818 Presence of other cardiac implants and grafts: Secondary | ICD-10-CM | POA: Diagnosis not present

## 2014-08-28 DIAGNOSIS — Y92009 Unspecified place in unspecified non-institutional (private) residence as the place of occurrence of the external cause: Secondary | ICD-10-CM | POA: Diagnosis not present

## 2014-08-28 DIAGNOSIS — F419 Anxiety disorder, unspecified: Secondary | ICD-10-CM | POA: Diagnosis present

## 2014-08-28 DIAGNOSIS — Z833 Family history of diabetes mellitus: Secondary | ICD-10-CM | POA: Diagnosis not present

## 2014-08-28 DIAGNOSIS — Z6841 Body Mass Index (BMI) 40.0 and over, adult: Secondary | ICD-10-CM | POA: Diagnosis not present

## 2014-08-28 DIAGNOSIS — Z8249 Family history of ischemic heart disease and other diseases of the circulatory system: Secondary | ICD-10-CM | POA: Diagnosis not present

## 2014-08-28 DIAGNOSIS — N179 Acute kidney failure, unspecified: Secondary | ICD-10-CM | POA: Diagnosis present

## 2014-08-28 DIAGNOSIS — K219 Gastro-esophageal reflux disease without esophagitis: Secondary | ICD-10-CM | POA: Diagnosis present

## 2014-08-28 DIAGNOSIS — R51 Headache: Secondary | ICD-10-CM | POA: Diagnosis not present

## 2014-08-28 DIAGNOSIS — Z825 Family history of asthma and other chronic lower respiratory diseases: Secondary | ICD-10-CM | POA: Diagnosis not present

## 2014-08-28 DIAGNOSIS — Z794 Long term (current) use of insulin: Secondary | ICD-10-CM | POA: Diagnosis not present

## 2014-08-28 DIAGNOSIS — R079 Chest pain, unspecified: Secondary | ICD-10-CM | POA: Diagnosis not present

## 2014-08-28 DIAGNOSIS — I252 Old myocardial infarction: Secondary | ICD-10-CM

## 2014-08-28 DIAGNOSIS — Z79899 Other long term (current) drug therapy: Secondary | ICD-10-CM

## 2014-08-28 DIAGNOSIS — G40909 Epilepsy, unspecified, not intractable, without status epilepticus: Secondary | ICD-10-CM | POA: Diagnosis present

## 2014-08-28 DIAGNOSIS — Z9181 History of falling: Secondary | ICD-10-CM | POA: Diagnosis not present

## 2014-08-28 DIAGNOSIS — S82892D Other fracture of left lower leg, subsequent encounter for closed fracture with routine healing: Secondary | ICD-10-CM | POA: Diagnosis not present

## 2014-08-28 DIAGNOSIS — R4781 Slurred speech: Secondary | ICD-10-CM | POA: Diagnosis present

## 2014-08-28 DIAGNOSIS — M7989 Other specified soft tissue disorders: Secondary | ICD-10-CM | POA: Diagnosis not present

## 2014-08-28 DIAGNOSIS — S2239XA Fracture of one rib, unspecified side, initial encounter for closed fracture: Secondary | ICD-10-CM | POA: Diagnosis not present

## 2014-08-28 DIAGNOSIS — M25559 Pain in unspecified hip: Secondary | ICD-10-CM

## 2014-08-28 DIAGNOSIS — S82832A Other fracture of upper and lower end of left fibula, initial encounter for closed fracture: Principal | ICD-10-CM | POA: Diagnosis present

## 2014-08-28 DIAGNOSIS — I25118 Atherosclerotic heart disease of native coronary artery with other forms of angina pectoris: Secondary | ICD-10-CM | POA: Diagnosis present

## 2014-08-28 DIAGNOSIS — S82192A Other fracture of upper end of left tibia, initial encounter for closed fracture: Secondary | ICD-10-CM | POA: Diagnosis not present

## 2014-08-28 DIAGNOSIS — I2510 Atherosclerotic heart disease of native coronary artery without angina pectoris: Secondary | ICD-10-CM | POA: Diagnosis present

## 2014-08-28 DIAGNOSIS — Z7982 Long term (current) use of aspirin: Secondary | ICD-10-CM

## 2014-08-28 DIAGNOSIS — E119 Type 2 diabetes mellitus without complications: Secondary | ICD-10-CM | POA: Diagnosis present

## 2014-08-28 DIAGNOSIS — K222 Esophageal obstruction: Secondary | ICD-10-CM

## 2014-08-28 DIAGNOSIS — R404 Transient alteration of awareness: Secondary | ICD-10-CM | POA: Diagnosis not present

## 2014-08-28 DIAGNOSIS — S299XXA Unspecified injury of thorax, initial encounter: Secondary | ICD-10-CM | POA: Diagnosis not present

## 2014-08-28 DIAGNOSIS — R531 Weakness: Secondary | ICD-10-CM

## 2014-08-28 DIAGNOSIS — W19XXXA Unspecified fall, initial encounter: Secondary | ICD-10-CM | POA: Diagnosis not present

## 2014-08-28 DIAGNOSIS — E1059 Type 1 diabetes mellitus with other circulatory complications: Secondary | ICD-10-CM | POA: Diagnosis not present

## 2014-08-28 DIAGNOSIS — Z955 Presence of coronary angioplasty implant and graft: Secondary | ICD-10-CM | POA: Diagnosis not present

## 2014-08-28 DIAGNOSIS — S098XXA Other specified injuries of head, initial encounter: Secondary | ICD-10-CM | POA: Diagnosis not present

## 2014-08-28 DIAGNOSIS — R296 Repeated falls: Secondary | ICD-10-CM | POA: Diagnosis present

## 2014-08-28 DIAGNOSIS — R52 Pain, unspecified: Secondary | ICD-10-CM

## 2014-08-28 DIAGNOSIS — S99912A Unspecified injury of left ankle, initial encounter: Secondary | ICD-10-CM | POA: Diagnosis not present

## 2014-08-28 LAB — URINALYSIS, ROUTINE W REFLEX MICROSCOPIC
BILIRUBIN URINE: NEGATIVE
GLUCOSE, UA: NEGATIVE mg/dL
HGB URINE DIPSTICK: NEGATIVE
Ketones, ur: NEGATIVE mg/dL
Leukocytes, UA: NEGATIVE
Nitrite: NEGATIVE
Protein, ur: NEGATIVE mg/dL
SPECIFIC GRAVITY, URINE: 1.011 (ref 1.005–1.030)
Urobilinogen, UA: 0.2 mg/dL (ref 0.0–1.0)
pH: 6 (ref 5.0–8.0)

## 2014-08-28 LAB — RAPID URINE DRUG SCREEN, HOSP PERFORMED
AMPHETAMINES: NOT DETECTED
BENZODIAZEPINES: POSITIVE — AB
Barbiturates: NOT DETECTED
Cocaine: NOT DETECTED
OPIATES: NOT DETECTED
TETRAHYDROCANNABINOL: NOT DETECTED

## 2014-08-28 LAB — APTT: APTT: 31 s (ref 24–37)

## 2014-08-28 LAB — COMPREHENSIVE METABOLIC PANEL
ALBUMIN: 3.4 g/dL — AB (ref 3.5–5.0)
ALK PHOS: 68 U/L (ref 38–126)
ALT: 14 U/L (ref 14–54)
AST: 26 U/L (ref 15–41)
Anion gap: 8 (ref 5–15)
BILIRUBIN TOTAL: 0.4 mg/dL (ref 0.3–1.2)
BUN: 20 mg/dL (ref 6–20)
CALCIUM: 9 mg/dL (ref 8.9–10.3)
CO2: 29 mmol/L (ref 22–32)
Chloride: 107 mmol/L (ref 101–111)
Creatinine, Ser: 0.66 mg/dL (ref 0.44–1.00)
GFR calc Af Amer: >60 mL/min (ref >60)
GFR calc non Af Amer: >60 mL/min (ref >60)
GLUCOSE: 149 mg/dL — AB (ref 65–99)
Potassium: 3.7 mmol/L (ref 3.5–5.1)
SODIUM: 144 mmol/L (ref 135–145)
TOTAL PROTEIN: 7.1 g/dL (ref 6.5–8.1)

## 2014-08-28 LAB — ETHANOL: Alcohol, Ethyl (B): <5 mg/dL (ref <5)

## 2014-08-28 LAB — CBC
HCT: 34.8 % — ABNORMAL LOW (ref 36.0–46.0)
HEMOGLOBIN: 11.2 g/dL — AB (ref 12.0–15.0)
MCH: 30.9 pg (ref 26.0–34.0)
MCHC: 32.2 g/dL (ref 30.0–36.0)
MCV: 95.9 fL (ref 78.0–100.0)
PLATELETS: 353 10*3/uL (ref 150–400)
RBC: 3.63 MIL/uL — AB (ref 3.87–5.11)
RDW: 13.8 % (ref 11.5–15.5)
WBC: 11.2 10*3/uL — AB (ref 4.0–10.5)

## 2014-08-28 LAB — I-STAT ARTERIAL BLOOD GAS, ED
BICARBONATE: 25.3 meq/L — AB (ref 20.0–24.0)
O2 SAT: 92 %
Patient temperature: 98.6
TCO2: 27 mmol/L (ref 0–100)
pCO2 arterial: 43.1 mmHg (ref 35.0–45.0)
pH, Arterial: 7.376 (ref 7.350–7.450)
pO2, Arterial: 66 mmHg — ABNORMAL LOW (ref 80.0–100.0)

## 2014-08-28 LAB — TROPONIN I: Troponin I: 0.6 ng/mL (ref <0.031)

## 2014-08-28 LAB — DIFFERENTIAL
BASOS ABS: 0 10*3/uL (ref 0.0–0.1)
Basophils Relative: 0 % (ref 0–1)
Eosinophils Absolute: 0.1 10*3/uL (ref 0.0–0.7)
Eosinophils Relative: 1 % (ref 0–5)
LYMPHS ABS: 1.7 10*3/uL (ref 0.7–4.0)
LYMPHS PCT: 15 % (ref 12–46)
Monocytes Absolute: 0.6 10*3/uL (ref 0.1–1.0)
Monocytes Relative: 6 % (ref 3–12)
NEUTROS ABS: 8.7 10*3/uL — AB (ref 1.7–7.7)
NEUTROS PCT: 78 % — AB (ref 43–77)

## 2014-08-28 LAB — CBG MONITORING, ED: GLUCOSE-CAPILLARY: 136 mg/dL — AB (ref 65–99)

## 2014-08-28 LAB — PROTIME-INR
INR: 1.1 (ref 0.00–1.49)
Prothrombin Time: 14.4 seconds (ref 11.6–15.2)

## 2014-08-28 MED ORDER — ENOXAPARIN SODIUM 60 MG/0.6ML ~~LOC~~ SOLN
60.0000 mg | Freq: Every day | SUBCUTANEOUS | Status: DC
  Administered 2014-08-29 – 2014-09-02 (×4): 60 mg via SUBCUTANEOUS
  Filled 2014-08-28 (×5): qty 0.6

## 2014-08-28 MED ORDER — ONDANSETRON HCL 4 MG PO TABS: 4.0000 mg | ORAL_TABLET | Freq: Four times a day (QID) | ORAL | Status: DC | PRN

## 2014-08-28 MED ORDER — ENOXAPARIN SODIUM 30 MG/0.3ML ~~LOC~~ SOLN: 30.0000 mg | SUBCUTANEOUS | Status: DC

## 2014-08-28 MED ORDER — SODIUM CHLORIDE 0.9 % IV SOLN: INTRAVENOUS | Status: DC

## 2014-08-28 MED ORDER — SODIUM CHLORIDE 0.9 % IJ SOLN
3.0000 mL | Freq: Two times a day (BID) | INTRAMUSCULAR | Status: DC
  Administered 2014-08-29 – 2014-09-02 (×6): 3 mL via INTRAVENOUS

## 2014-08-28 MED ORDER — CETYLPYRIDINIUM CHLORIDE 0.05 % MT LIQD
7.0000 mL | Freq: Two times a day (BID) | OROMUCOSAL | Status: DC
  Administered 2014-08-29 – 2014-09-02 (×9): 7 mL via OROMUCOSAL

## 2014-08-28 MED ORDER — OXYCODONE HCL 5 MG PO TABS
5.0000 mg | ORAL_TABLET | ORAL | Status: DC | PRN
  Administered 2014-08-29 – 2014-09-02 (×8): 5 mg via ORAL
  Filled 2014-08-28 (×8): qty 1

## 2014-08-28 MED ORDER — ACETAMINOPHEN 650 MG RE SUPP: 650.0000 mg | Freq: Four times a day (QID) | RECTAL | Status: DC | PRN

## 2014-08-28 MED ORDER — ALUM & MAG HYDROXIDE-SIMETH 200-200-20 MG/5ML PO SUSP: 30.0000 mL | Freq: Four times a day (QID) | ORAL | Status: DC | PRN

## 2014-08-28 MED ORDER — ASPIRIN EC 325 MG PO TBEC
325.0000 mg | DELAYED_RELEASE_TABLET | Freq: Every day | ORAL | Status: DC
Start: 2014-08-29 — End: 2014-09-02
  Administered 2014-08-29 – 2014-09-02 (×5): 325 mg via ORAL
  Filled 2014-08-28 (×5): qty 1

## 2014-08-28 MED ORDER — HYDROMORPHONE HCL 1 MG/ML IJ SOLN
0.5000 mg | INTRAMUSCULAR | Status: DC | PRN
  Administered 2014-08-29 – 2014-09-02 (×17): 1 mg via INTRAVENOUS
  Filled 2014-08-28 (×18): qty 1

## 2014-08-28 MED ORDER — ACETAMINOPHEN 325 MG PO TABS: 650.0000 mg | ORAL_TABLET | Freq: Four times a day (QID) | ORAL | Status: DC | PRN

## 2014-08-28 MED ORDER — ONDANSETRON HCL 4 MG/2ML IJ SOLN: 4.0000 mg | Freq: Four times a day (QID) | INTRAMUSCULAR | Status: DC | PRN

## 2014-08-28 NOTE — ED Notes (Signed)
MD at bedside. 

## 2014-08-28 NOTE — ED Notes (Signed)
Report provided to [facility], enroute for transport to [facility]

## 2014-08-28 NOTE — ED Notes (Signed)
Arrived via EMS, per EMS pt had fallen, 5th episode today per EMS/ family, EMS also states they have been to residence several times today to assist with getting up secondary to fall

## 2014-08-28 NOTE — ED Notes (Signed)
Family at bedside. 

## 2014-08-28 NOTE — ED Notes (Signed)
Pt has decided to stay at this time. Will consult for admission

## 2014-08-28 NOTE — H&P (Addendum)
[facility] Admission History and Physical       [name] [id number] DOB: 1945/06/04 DOA: 08/28/2014  Referring physician: EDP PCP: No PCP Per Patient  Specialists:   Chief Complaint: Weakness and Increased Falls and slurred Speech  HPI: [name] is a 69 y.o. female with a history of CAD S/P PCI with Stent RCA (2011), IDDM, HTN, Seizure Disorder, and Hyperlipidemia who presented to the [facility] ED with complaints of increased weakness and multiple falls and slurred speech over the last 3-4 days.  She reports increased pain in her Left Ankle and Left knee after her last fall.  She denies any LOC and denies any chest pain.   A CT scan of the head was performed and was negative for acute findings, and X-rays of the Right Knee and Left ankle were negative for fracture.   She was referred for further evaluation    Review of Systems: Constitutional: No Weight Loss, No Weight Gain, Night Sweats, Fevers, Chills, Dizziness, Light Headedness, Fatigue, +Generalized Weakness HEENT: No Headaches, Difficulty Swallowing,Tooth/Dental Problems,Sore Throat,  No Sneezing, Rhinitis, Ear Ache, Nasal Congestion, or Post Nasal Drip,  Cardio-vascular:  No Chest pain, Orthopnea, PND, Edema in Lower Extremities, Anasarca, Dizziness, Palpitations  Resp: No Dyspnea, No DOE, No Productive Cough, No Non-Productive Cough, No Hemoptysis, No Wheezing.    GI: No Heartburn, Indigestion, Abdominal Pain, Nausea, Vomiting, Diarrhea, Constipation, Hematemesis, Hematochezia, Melena, Change in Bowel Habits,  Loss of Appetite  GU: No Dysuria, No Change in Color of Urine, No Urgency or Urinary Frequency, No Flank pain.  Musculoskeletal: No Joint Pain or Swelling, No Decreased Range of Motion, No Back Pain.  Neurologic: No Syncope, No Seizures, Muscle Weakness, Paresthesia, Vision Disturbance or Loss, No Diplopia, No Vertigo, No Difficulty Walking,  Skin: No Rash or Lesions. Psych: No Change in Mood or Affect, No  Depression or Anxiety, No Memory loss, No Confusion, or Hallucinations   Past Medical History  Diagnosis Date  . Syncope and collapse   . Seizure disorder   . Diabetes mellitus     insulin dependent  . Hyperlipidemia   . Hypertension   . Morbid obesity   . Depression   . Anxiety   . GERD (gastroesophageal reflux disease)   . Knee pain   . Coronary artery disease   . Fatigue   . SOB (shortness of breath)   . Orthopnea   . Edema of lower extremity   . Nonproductive cough     chronic  . Hiatal hernia 2008    EGD  . Esophageal stricture 2008    EGD  . Tremor   . Heart murmur      Past Surgical History  Procedure Laterality Date  . Knee arthroscopy    . Transthoracic echocardiogram      showed ef of 65% with no regional wall motion abnormalities  . Cardiac catheterization  03/28/2009  . Abdominal hysterectomy        Prior to Admission medications   Medication Sig Start Date End Date Taking? Authorizing Provider  ALPRAZolam Duanne Moron) 1 MG tablet Take 1 mg by mouth daily as needed for anxiety.   Yes Historical Provider, MD  aspirin EC 81 MG tablet Take 81 mg by mouth 2 (two) times daily.   Yes Historical Provider, MD  CRESTOR 10 MG tablet Take 10 mg by mouth daily.  07/09/14  Yes Historical Provider, MD  furosemide (LASIX) 40 MG tablet Take 40 mg by mouth every morning.    Yes Historical  Provider, MD  insulin NPH Human (HUMULIN N,NOVOLIN N) 100 UNIT/ML injection Inject 0.2 mLs (20 Units total) into the skin at bedtime. 11/07/13  Yes Renato Shin, MD  insulin regular (NOVOLIN R,HUMULIN R) 100 units/mL injection Inject 0.15 mLs (15 Units total) into the skin 3 (three) times daily before meals. 11/07/13  Yes Renato Shin, MD  levETIRAcetam (KEPPRA) 1000 MG tablet Take 1,000 mg by mouth 2 (two) times daily. 02/20/14  Yes Historical Provider, MD  metoprolol (LOPRESSOR) 50 MG tablet Take 50 mg by mouth 2 (two) times daily.  07/02/14  Yes Historical Provider, MD  nitroGLYCERIN  (NITROSTAT) 0.4 MG SL tablet Place 0.4 mg under the tongue as needed for chest pain.   Yes Historical Provider, MD  potassium chloride SA (K-DUR,KLOR-CON) 20 MEQ tablet Take 20 mEq by mouth every morning.    Yes Historical Provider, MD  prasugrel (EFFIENT) 10 MG TABS tablet Take 10 mg by mouth daily.   Yes Historical Provider, MD  rizatriptan (MAXALT) 10 MG tablet Take 10 mg by mouth as needed for migraine.  08/02/14  Yes Historical Provider, MD  sertraline (ZOLOFT) 50 MG tablet Take 100 mg by mouth every morning.  02/27/14  Yes Historical Provider, MD  valsartan (DIOVAN) 160 MG tablet Take 160 mg by mouth daily. 02/27/14  Yes Historical Provider, MD  VENTOLIN HFA 108 (90 BASE) MCG/ACT inhaler Inhale 1-2 puffs into the lungs every 6 (six) hours as needed for wheezing or shortness of breath.  07/29/14  Yes Historical Provider, MD  Insulin Syringe-Needle U-100 31G X 15/64" 0.5 ML MISC 1 Device by Does not apply route 3 (three) times daily with meals. 11/07/13   Renato Shin, MD     Allergies  Allergen Reactions  . Codeine Itching    Social History:  reports that she quit smoking about 44 years ago. She does not have any smokeless tobacco history on file. She reports that she does not drink alcohol or use illicit drugs.    Family History  Problem Relation Age of Onset  . Heart attack Mother   . Hypertension Mother   . COPD Father   . Heart disease Sister   . Diabetes Brother   . Diabetes Brother   . Prostate cancer Brother   . Kidney disease Brother        Physical Exam:  GEN:  Pleasant Obese 69 y.o. Caucasian female examined and in no acute distress; cooperative with exam Filed Vitals:   08/28/14 2032 08/28/14 2143 08/28/14 2308 08/28/14 2315  BP:  131/60  147/69  Pulse:  88  82  Temp: 98.2 F (36.8 C) 98.2 F (36.8 C)  98.3 F (36.8 C)  TempSrc:  Oral  Oral  Resp:  28  22  Height:   5' (1.524 m)   Weight:   112.6 kg (248 lb 3.8 oz)   SpO2:  100%  100%   Blood pressure  147/69, pulse 82, temperature 98.3 F (36.8 C), temperature source Oral, resp. rate 22, height 5' (1.524 m), weight 112.6 kg (248 lb 3.8 oz), SpO2 100 %. PSYCH: SHe is alert and oriented x4; does not appear anxious does not appear depressed; affect is normal HEENT: Normocephalic and Atraumatic, Mucous membranes pink; PERRLA; EOM intact; Fundi:  Benign;  No scleral icterus, Nares: Patent, Oropharynx: Clear, Fair Dentition,    Neck:  FROM, No Cervical Lymphadenopathy nor Thyromegaly or Carotid Bruit; No JVD; Breasts:: Not examined CHEST WALL: No tenderness CHEST: Normal respiration, clear to auscultation bilaterally  HEART: Regular rate and rhythm; no murmurs rubs or gallops BACK: No kyphosis or scoliosis; No CVA tenderness ABDOMEN: Positive Bowel Sounds, Obese, Soft Non-Tender, No Rebound or Guarding; No Masses, No Organomegaly, No Pannus; No Intertriginous candida. Rectal Exam: Not done EXTREMITIES: No Cyanosis, Clubbing, or Edema; No Ulcerations. Genitalia: not examined PULSES: 2+ and symmetric SKIN: Normal hydration no rash or ulceration CNS:  Alert and Oriented x 4, No Focal Deficits Vascular: pulses palpable throughout    Labs on Admission:  Basic Metabolic Panel:  Recent Labs Lab 08/28/14 1506  NA 144  K 3.7  CL 107  CO2 29  GLUCOSE 149*  BUN 20  CREATININE 0.66  CALCIUM 9.0   Liver Function Tests:  Recent Labs Lab 08/28/14 1506  AST 26  ALT 14  ALKPHOS 68  BILITOT 0.4  PROT 7.1  ALBUMIN 3.4*   No results for input(s): LIPASE, AMYLASE in the last 168 hours. No results for input(s): AMMONIA in the last 168 hours. CBC:  Recent Labs Lab 08/28/14 1506  WBC 11.2*  NEUTROABS 8.7*  HGB 11.2*  HCT 34.8*  MCV 95.9  PLT 353   Cardiac Enzymes:  Recent Labs Lab 08/28/14 1506  TROPONINI 0.60*    BNP (last 3 results)  Recent Labs  08/04/14 0705  BNP 485.3*    ProBNP (last 3 results) No results for input(s): PROBNP in the last 8760  hours.  CBG:  Recent Labs Lab 08/28/14 1503  GLUCAP 136*    Radiological Exams on Admission: Dg Chest 1 View  08/28/2014   CLINICAL DATA:  Status post fall, knee pain, ankle pain  EXAM: CHEST  1 VIEW  COMPARISON:  08/04/2014  FINDINGS: There is no focal parenchymal opacity. There is no pleural effusion or pneumothorax. There is stable cardiomegaly. There is thoracic aortic atherosclerosis.  The osseous structures are unremarkable.  IMPRESSION: No active disease.   Electronically Signed   By: Kathreen Devoid   On: 08/28/2014 17:04   Dg Ankle Complete Left  08/28/2014   CLINICAL DATA:  Fall.  Weakness  EXAM: LEFT ANKLE COMPLETE - 3+ VIEW  COMPARISON:  None.  FINDINGS: Mild diffuse soft tissue swelling identified. There is no fracture or subluxation identified. No radio-opaque foreign body or soft tissue calcification. Small plantar heel spur noted.  IMPRESSION: 1. No acute bone abnormality. 2. Soft tissue swelling.   Electronically Signed   By: Kerby Moors M.D.   On: 08/28/2014 17:07   Ct Head Wo Contrast  08/28/2014   CLINICAL DATA:  Weakness with multiple areas of pain. Multiple falls with bruising, swelling and headaches. History of seizure disorder, diabetes and syncope. Initial encounter.  EXAM: CT HEAD WITHOUT CONTRAST  TECHNIQUE: Contiguous axial images were obtained from the base of the skull through the vertex without intravenous contrast.  COMPARISON:  08/04/2014 and 05/28/2010.  FINDINGS: Mild motion artifact. There is no evidence of acute intracranial hemorrhage, mass lesion, brain edema or extra-axial fluid collection. The ventricles and subarachnoid spaces remain appropriately sized for age. There is stable mild periventricular white matter disease. No evidence of acute infarct.  Probable chronic postsurgical changes in the left mastoid air cells. The visualized paranasal sinuses, mastoid air cells and middle ears are otherwise clear. The calvarium is intact.  IMPRESSION: Stable  examination without acute intracranial or calvarial findings. Mild chronic periventricular white matter disease.   Electronically Signed   By: Richardean Sale M.D.   On: 08/28/2014 17:02   Dg Knee Complete 4 Views Right  08/28/2014   CLINICAL DATA:  [place] multiple times today. Complains of right knee pain.  EXAM: RIGHT KNEE - COMPLETE 4+ VIEW  COMPARISON:  None.  FINDINGS: Medial joint space narrowing with osteophytosis. Extensive degenerative changes at the patellofemoral compartment. No definite joint effusion. Negative for an acute fracture or dislocation. Evidence for vascular calcifications.  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  No acute bone abnormality.   Electronically Signed   By: Markus Daft M.D.   On: 08/28/2014 17:02     EKG: Independently reviewed.    Assessment/Plan:   69 y.o. female with  Principal Problem:   1.    Weakness generalized   Hold benzodiazepine Rx   Check Orthostatic vitals   Check TSH   Check CPK levels   Check B12,    Check Vit D Level   PT evaluation   Active Problems:   2.    Falls due to #1   OOB with Assist    Fall Precautions     3.    Cardiac enzymes elevated   Cardiac Monitoring   Cycle Troponins     4.    Slurred Speech- Ct scan of Head: Negative for acute findings   Cardiac Monitoring   Neuro checks     5.    CAD (coronary artery disease)   Continue Metoprolol, Diovan,Crestor, Effient and ASA Rx     6.    Essential hypertension   Continue Metoprolol, Lasix and Diovan Rx   Monitor BPs     7.    Hypothyroidism   Check TSH level     8.    Diabetes   Continue NPH Insulin   SSI coverage PRN   Check HbA1C     9.    Seizure disorder   On Keppra Rx     10.    DVT Prophylaxis   Lovenox         Code Status:     FULL CODE       Family Communication:  No Family Present    Disposition Plan:  Observation Status        Time spent:  [place] [facility] Pager  [phone]   If [place] Please Contact the Day Rounding Team MD for [facility]  If 7PM-7AM, Please Contact Night-Floor Coverage  [email or web address] Password [email or web address] 08/28/2014, 11:40 PM     ADDENDUM:   Patient was seen and examined on 08/28/2014

## 2014-08-28 NOTE — ED Notes (Signed)
Pt requesting to go home. MD Tomi Bamberger made aware. MD to bedside at this time.

## 2014-08-28 NOTE — ED Notes (Signed)
DELAY IN PATIENT CHARTING DUE TO DOWNTIME. REFER TO DOWNTIME SHEETS

## 2014-08-28 NOTE — ED Provider Notes (Addendum)
CSN: [id number]     Arrival date & time 08/28/14  1451 History   First MD Initiated Contact with Patient 08/28/14 1505     Chief Complaint  Patient presents with  . Fall    HPI Pt has history of having spalls where she gets weak and starts falling all of the time.  Starting this am around 3 am she fell. She is unable to get up on her own and has to call 911.  Pt fell 6 times today.  Family called 78 and she was brought to the ED.   She fells weak all over.  Family states her speech is slurred.  During her falls she has possibly been losing consciousness.  She is not sure why she is falling.  .  No fevers.  No vomiting.  She does have some pain in her lower leg on the left.  No headache.  No chest pain or shortness of breath.  She was in the ED almost one month ago and was diagnosed with a non stemi.  She has not seen anyone since that visit. Past Medical History  Diagnosis Date  . Syncope and collapse   . Seizure disorder   . Diabetes mellitus     insulin dependent  . Hyperlipidemia   . Hypertension   . Morbid obesity   . Depression   . Anxiety   . GERD (gastroesophageal reflux disease)   . Knee pain   . Coronary artery disease   . Fatigue   . SOB (shortness of breath)   . Orthopnea   . Edema of lower extremity   . Nonproductive cough     chronic  . Hiatal hernia 2008    EGD  . Esophageal stricture 2008    EGD  . Tremor   . Heart murmur    Past Surgical History  Procedure Laterality Date  . Knee arthroscopy    . Transthoracic echocardiogram      showed ef of 65% with no regional wall motion abnormalities  . Cardiac catheterization  03/28/2009  . Abdominal hysterectomy     Family History  Problem Relation Age of Onset  . Heart attack Mother   . Hypertension Mother   . COPD Father   . Heart disease Sister   . Diabetes Brother   . Diabetes Brother   . Prostate cancer Brother   . Kidney disease Brother    Social History  Substance Use Topics  . Smoking status:  Former Smoker    Quit date: 06/02/1970  . Smokeless tobacco: None  . Alcohol Use: No   OB History    No data available     Review of Systems  All other systems reviewed and are negative.     Allergies  Codeine  Home Medications   Prior to Admission medications   Medication Sig Start Date End Date Taking? Authorizing Provider  ALPRAZolam Duanne Moron) 1 MG tablet Take 1 mg by mouth daily as needed for anxiety.    Historical Provider, MD  aspirin EC 81 MG tablet Take 81 mg by mouth 2 (two) times daily.    Historical Provider, MD  CRESTOR 10 MG tablet Take 10 mg by mouth daily.  07/09/14   Historical Provider, MD  furosemide (LASIX) 40 MG tablet Take 40 mg by mouth every morning.     Historical Provider, MD  insulin NPH Human (HUMULIN N,NOVOLIN N) 100 UNIT/ML injection Inject 0.2 mLs (20 Units total) into the skin at bedtime. 11/07/13  Renato Shin, MD  insulin regular (NOVOLIN R,HUMULIN R) 100 units/mL injection Inject 0.15 mLs (15 Units total) into the skin 3 (three) times daily before meals. 11/07/13   Renato Shin, MD  Insulin Syringe-Needle U-100 31G X 15/64" 0.5 ML MISC 1 Device by Does not apply route 3 (three) times daily with meals. 11/07/13   Renato Shin, MD  levETIRAcetam (KEPPRA) 1000 MG tablet Take 1,000 mg by mouth 2 (two) times daily. 02/20/14   Historical Provider, MD  metoprolol (LOPRESSOR) 50 MG tablet Take 50 mg by mouth 2 (two) times daily.  07/02/14   Historical Provider, MD  nitroGLYCERIN (NITROSTAT) 0.4 MG SL tablet Place 0.4 mg under the tongue as needed for chest pain.    Historical Provider, MD  potassium chloride SA (K-DUR,KLOR-CON) 20 MEQ tablet Take 20 mEq by mouth every morning.     Historical Provider, MD  prasugrel (EFFIENT) 10 MG TABS tablet Take 10 mg by mouth daily.    Historical Provider, MD  rizatriptan (MAXALT) 10 MG tablet Take 10 mg by mouth as needed for migraine.  08/02/14   Historical Provider, MD  sertraline (ZOLOFT) 100 MG tablet Take 100 mg by mouth  at bedtime.    Historical Provider, MD  sertraline (ZOLOFT) 50 MG tablet Take 100 mg by mouth every morning.  02/27/14   Historical Provider, MD  valsartan (DIOVAN) 160 MG tablet Take 160 mg by mouth daily. 02/27/14   Historical Provider, MD  VENTOLIN HFA 108 (90 BASE) MCG/ACT inhaler Inhale 1-2 puffs into the lungs every 6 (six) hours as needed for wheezing or shortness of breath.  07/29/14   Historical Provider, MD   BP 140/65 mmHg  Pulse 74  Temp(Src) 98 F (36.7 C) (Oral)  Resp 16  SpO2 98% Physical Exam  Constitutional: No distress.  obese  HENT:  Head: Normocephalic.  Right Ear: External ear normal.  Left Ear: External ear normal.  Old bruises beneath both eyes   Eyes: Conjunctivae are normal. Right eye exhibits no discharge. Left eye exhibits no discharge. No scleral icterus.  Neck: Neck supple. No tracheal deviation present.  Cardiovascular: Normal rate, regular rhythm and intact distal pulses.   Pulmonary/Chest: Effort normal and breath sounds normal. No stridor. No respiratory distress. She has no wheezes. She has no rales.  Abdominal: Soft. Bowel sounds are normal. She exhibits no distension. There is no tenderness. There is no rebound and no guarding.  Musculoskeletal: She exhibits no edema.       Right knee: She exhibits ecchymosis. Tenderness found.       Left ankle: Tenderness.  Neurological: She is alert. No cranial nerve deficit (no facial droop, extraocular movements intact, no slurred speech) or sensory deficit. She exhibits normal muscle tone. She displays no seizure activity. GCS eye subscore is 4. GCS verbal subscore is 5. GCS motor subscore is 6.  Generalized weakness but can move all 4 extremities  Skin: Skin is warm and dry. No rash noted.  Psychiatric: She has a normal mood and affect.  Nursing note and vitals reviewed.   ED Course  Procedures (including critical care time) Labs Review Labs Reviewed  CBC - Abnormal; Notable for the following:    WBC 11.2  (*)    RBC 3.63 (*)    Hemoglobin 11.2 (*)    HCT 34.8 (*)    All other components within normal limits  DIFFERENTIAL - Abnormal; Notable for the following:    Neutrophils Relative % 78 (*)    Neutro  Abs 8.7 (*)    All other components within normal limits  COMPREHENSIVE METABOLIC PANEL - Abnormal; Notable for the following:    Glucose, Bld 149 (*)    Albumin 3.4 (*)    All other components within normal limits  TROPONIN I - Abnormal; Notable for the following:    Troponin I 0.60 (*)    All other components within normal limits  URINE RAPID DRUG SCREEN, HOSP PERFORMED - Abnormal; Notable for the following:    Benzodiazepines POSITIVE (*)    All other components within normal limits  CBG MONITORING, ED - Abnormal; Notable for the following:    Glucose-Capillary 136 (*)    All other components within normal limits  I-STAT ARTERIAL BLOOD GAS, ED - Abnormal; Notable for the following:    pO2, Arterial 66.0 (*)    Bicarbonate 25.3 (*)    All other components within normal limits  ETHANOL  PROTIME-INR  APTT  URINALYSIS, ROUTINE W REFLEX MICROSCOPIC (NOT AT [facility])    Imaging Review Dg Chest 1 View  08/28/2014   CLINICAL DATA:  Status post fall, knee pain, ankle pain  EXAM: CHEST  1 VIEW  COMPARISON:  08/04/2014  FINDINGS: There is no focal parenchymal opacity. There is no pleural effusion or pneumothorax. There is stable cardiomegaly. There is thoracic aortic atherosclerosis.  The osseous structures are unremarkable.  IMPRESSION: No active disease.   Electronically Signed   By: Kathreen Devoid   On: 08/28/2014 17:04   Dg Ankle Complete Left  08/28/2014   CLINICAL DATA:  Fall.  Weakness  EXAM: LEFT ANKLE COMPLETE - 3+ VIEW  COMPARISON:  None.  FINDINGS: Mild diffuse soft tissue swelling identified. There is no fracture or subluxation identified. No radio-opaque foreign body or soft tissue calcification. Small plantar heel spur noted.  IMPRESSION: 1. No acute bone abnormality. 2. Soft tissue  swelling.   Electronically Signed   By: Kerby Moors M.D.   On: 08/28/2014 17:07   Ct Head Wo Contrast  08/28/2014   CLINICAL DATA:  Weakness with multiple areas of pain. Multiple falls with bruising, swelling and headaches. History of seizure disorder, diabetes and syncope. Initial encounter.  EXAM: CT HEAD WITHOUT CONTRAST  TECHNIQUE: Contiguous axial images were obtained from the base of the skull through the vertex without intravenous contrast.  COMPARISON:  08/04/2014 and 05/28/2010.  FINDINGS: Mild motion artifact. There is no evidence of acute intracranial hemorrhage, mass lesion, brain edema or extra-axial fluid collection. The ventricles and subarachnoid spaces remain appropriately sized for age. There is stable mild periventricular white matter disease. No evidence of acute infarct.  Probable chronic postsurgical changes in the left mastoid air cells. The visualized paranasal sinuses, mastoid air cells and middle ears are otherwise clear. The calvarium is intact.  IMPRESSION: Stable examination without acute intracranial or calvarial findings. Mild chronic periventricular white matter disease.   Electronically Signed   By: Richardean Sale M.D.   On: 08/28/2014 17:02   Dg Knee Complete 4 Views Right  08/28/2014   CLINICAL DATA:  [place] multiple times today. Complains of right knee pain.  EXAM: RIGHT KNEE - COMPLETE 4+ VIEW  COMPARISON:  None.  FINDINGS: Medial joint space narrowing with osteophytosis. Extensive degenerative changes at the patellofemoral compartment. No definite joint effusion. Negative for an acute fracture or dislocation. Evidence for vascular calcifications.  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  No acute bone abnormality.   Electronically Signed   By: Scherrie Gerlach.D.  On: 08/28/2014 17:02   I have personally reviewed and evaluated these imaging reports and lab results as part of my medical decision-making.   EKG Interpretation   Date/Time:  Wednesday August 28 2014 17:55:53 EDT Ventricular Rate:  74 PR Interval:  160 QRS Duration: 94 QT Interval:  408 QTC Calculation: 452 R Axis:   3 Text Interpretation:  Normal sinus rhythm Possible Inferior infarct , age  undetermined ST \\T \ T wave abnormality, consider lateral ischemia Abnormal  ECG No significant change since last tracing Confirmed by Caroll Weinheimer  MD-J, Stephfon Bovey  [id number]) on 08/28/2014 5:56:33 PM      MDM   Final diagnoses:  Cardiac enzymes elevated  Frequent falls  Weakness    I reviewed the findings with the patient and the family.  I recommended the patient be admitted to the hospital because of her frequent falling. It is possible she has had an occult stroke.  She does take benzodiazepines and they could be contributing. She also has an elevated cardiac enzyme and history of coronary artery disease. Patient however is adamant that she will not stay in the hospital. In fact, her family is very upset and they have tried to encourage her to stay. Her husband has told the patient that he cannot keep on trying to pick her up.  Patient is alert, awake and oriented. Cannot admit her to the hospital against her will.  I will be happy to arrange for admission if she will agree to stay.    Dorie Rank, MD 08/28/14 1822  Pt has agreed to stay.  I will consult with hospitalist regarding possible admission, further evaluation of her weakness and falls.  Dorie Rank, MD 08/28/14 [phone]

## 2014-08-28 NOTE — Progress Notes (Signed)
Rx Brief Lovenox note Wt=112 kg, CrCl~75 ml/min, BMI=48  Rx adjusted Lovenox to 60mg  ( 0.5mg /kg ) daily in pt with BMI>30  Thanks Dorrene German 08/28/2014 11:54 PM

## 2014-08-28 NOTE — ED Notes (Signed)
Pt and family discussing plan at this time. Pt adamant on going home.

## 2014-08-29 ENCOUNTER — Observation Stay (HOSPITAL_COMMUNITY): Payer: Medicare Other

## 2014-08-29 ENCOUNTER — Encounter (HOSPITAL_COMMUNITY): Payer: Self-pay | Admitting: Radiology

## 2014-08-29 DIAGNOSIS — R748 Abnormal levels of other serum enzymes: Secondary | ICD-10-CM | POA: Diagnosis not present

## 2014-08-29 DIAGNOSIS — S82832A Other fracture of upper and lower end of left fibula, initial encounter for closed fracture: Secondary | ICD-10-CM | POA: Diagnosis not present

## 2014-08-29 DIAGNOSIS — M25552 Pain in left hip: Secondary | ICD-10-CM | POA: Diagnosis not present

## 2014-08-29 DIAGNOSIS — I10 Essential (primary) hypertension: Secondary | ICD-10-CM

## 2014-08-29 DIAGNOSIS — E1059 Type 1 diabetes mellitus with other circulatory complications: Secondary | ICD-10-CM | POA: Diagnosis not present

## 2014-08-29 DIAGNOSIS — E039 Hypothyroidism, unspecified: Secondary | ICD-10-CM

## 2014-08-29 DIAGNOSIS — R4781 Slurred speech: Secondary | ICD-10-CM | POA: Diagnosis not present

## 2014-08-29 DIAGNOSIS — R079 Chest pain, unspecified: Secondary | ICD-10-CM | POA: Diagnosis not present

## 2014-08-29 DIAGNOSIS — R531 Weakness: Secondary | ICD-10-CM | POA: Diagnosis not present

## 2014-08-29 DIAGNOSIS — S79912A Unspecified injury of left hip, initial encounter: Secondary | ICD-10-CM | POA: Diagnosis not present

## 2014-08-29 DIAGNOSIS — W19XXXA Unspecified fall, initial encounter: Secondary | ICD-10-CM

## 2014-08-29 LAB — BASIC METABOLIC PANEL
Anion gap: 4 — ABNORMAL LOW (ref 5–15)
BUN: 21 mg/dL — AB (ref 6–20)
CHLORIDE: 107 mmol/L (ref 101–111)
CO2: 31 mmol/L (ref 22–32)
CREATININE: 0.83 mg/dL (ref 0.44–1.00)
Calcium: 8.7 mg/dL — ABNORMAL LOW (ref 8.9–10.3)
GFR calc Af Amer: >60 mL/min (ref >60)
GFR calc non Af Amer: >60 mL/min (ref >60)
Glucose, Bld: 172 mg/dL — ABNORMAL HIGH (ref 65–99)
POTASSIUM: 3.7 mmol/L (ref 3.5–5.1)
Sodium: 142 mmol/L (ref 135–145)

## 2014-08-29 LAB — TSH: TSH: 1.982 u[IU]/mL (ref 0.350–4.500)

## 2014-08-29 LAB — CBC
HEMATOCRIT: 33.1 % — AB (ref 36.0–46.0)
Hemoglobin: 10.7 g/dL — ABNORMAL LOW (ref 12.0–15.0)
MCH: 31.3 pg (ref 26.0–34.0)
MCHC: 32.3 g/dL (ref 30.0–36.0)
MCV: 96.8 fL (ref 78.0–100.0)
PLATELETS: 350 10*3/uL (ref 150–400)
RBC: 3.42 MIL/uL — ABNORMAL LOW (ref 3.87–5.11)
RDW: 14.7 % (ref 11.5–15.5)
WBC: 9.9 10*3/uL (ref 4.0–10.5)

## 2014-08-29 LAB — TROPONIN I
TROPONIN I: 0.47 ng/mL — AB (ref <0.031)
Troponin I: 0.6 ng/mL (ref <0.031)
Troponin I: 0.47 ng/mL — ABNORMAL HIGH (ref <0.031)

## 2014-08-29 LAB — CK: CK TOTAL: 93 U/L (ref 38–234)

## 2014-08-29 LAB — GLUCOSE, CAPILLARY
GLUCOSE-CAPILLARY: 118 mg/dL — AB (ref 65–99)
GLUCOSE-CAPILLARY: 124 mg/dL — AB (ref 65–99)
GLUCOSE-CAPILLARY: 183 mg/dL — AB (ref 65–99)
Glucose-Capillary: 125 mg/dL — ABNORMAL HIGH (ref 65–99)
Glucose-Capillary: 144 mg/dL — ABNORMAL HIGH (ref 65–99)

## 2014-08-29 LAB — BRAIN NATRIURETIC PEPTIDE: B NATRIURETIC PEPTIDE 5: 297 pg/mL — AB (ref 0.0–100.0)

## 2014-08-29 LAB — VITAMIN B12: Vitamin B-12: 1114 pg/mL — ABNORMAL HIGH (ref 180–914)

## 2014-08-29 MED ORDER — LEVETIRACETAM 500 MG PO TABS
1000.0000 mg | ORAL_TABLET | Freq: Two times a day (BID) | ORAL | Status: DC
  Administered 2014-08-29 – 2014-09-02 (×10): 1000 mg via ORAL
  Filled 2014-08-29 (×10): qty 2

## 2014-08-29 MED ORDER — PRASUGREL HCL 10 MG PO TABS
10.0000 mg | ORAL_TABLET | Freq: Every day | ORAL | Status: DC
  Administered 2014-08-29 – 2014-09-02 (×5): 10 mg via ORAL
  Filled 2014-08-29 (×5): qty 1

## 2014-08-29 MED ORDER — METOPROLOL TARTRATE 50 MG PO TABS
50.0000 mg | ORAL_TABLET | Freq: Two times a day (BID) | ORAL | Status: DC
  Administered 2014-08-29 – 2014-09-02 (×9): 50 mg via ORAL
  Filled 2014-08-29 (×10): qty 1

## 2014-08-29 MED ORDER — POTASSIUM CHLORIDE CRYS ER 20 MEQ PO TBCR
20.0000 meq | EXTENDED_RELEASE_TABLET | Freq: Every morning | ORAL | Status: DC
  Administered 2014-08-29 – 2014-08-31 (×3): 20 meq via ORAL
  Filled 2014-08-29 (×3): qty 1

## 2014-08-29 MED ORDER — SERTRALINE HCL 100 MG PO TABS
100.0000 mg | ORAL_TABLET | Freq: Every morning | ORAL | Status: DC
  Administered 2014-08-29 – 2014-09-02 (×5): 100 mg via ORAL
  Filled 2014-08-29 (×5): qty 1

## 2014-08-29 MED ORDER — NITROGLYCERIN 0.4 MG SL SUBL: 0.4000 mg | SUBLINGUAL_TABLET | SUBLINGUAL | Status: DC | PRN

## 2014-08-29 MED ORDER — IRBESARTAN 150 MG PO TABS
150.0000 mg | ORAL_TABLET | Freq: Every day | ORAL | Status: DC
  Administered 2014-08-29 – 2014-08-30 (×2): 150 mg via ORAL
  Filled 2014-08-29 (×2): qty 1

## 2014-08-29 MED ORDER — INFLUENZA VAC SPLIT QUAD 0.5 ML IM SUSY
0.5000 mL | PREFILLED_SYRINGE | INTRAMUSCULAR | Status: AC
  Administered 2014-08-30: 0.5 mL via INTRAMUSCULAR
  Filled 2014-08-29 (×3): qty 0.5

## 2014-08-29 MED ORDER — FUROSEMIDE 40 MG PO TABS
40.0000 mg | ORAL_TABLET | Freq: Every morning | ORAL | Status: DC
  Administered 2014-08-29 – 2014-08-30 (×2): 40 mg via ORAL
  Filled 2014-08-29 (×2): qty 1

## 2014-08-29 MED ORDER — INSULIN ASPART 100 UNIT/ML ~~LOC~~ SOLN
0.0000 [IU] | Freq: Three times a day (TID) | SUBCUTANEOUS | Status: DC
  Administered 2014-08-29 – 2014-08-30 (×3): 1 [IU] via SUBCUTANEOUS
  Administered 2014-08-31 (×2): 2 [IU] via SUBCUTANEOUS
  Administered 2014-09-01: 1 [IU] via SUBCUTANEOUS

## 2014-08-29 MED ORDER — ALBUTEROL SULFATE (2.5 MG/3ML) 0.083% IN NEBU: 2.5000 mg | INHALATION_SOLUTION | Freq: Four times a day (QID) | RESPIRATORY_TRACT | Status: DC | PRN

## 2014-08-29 MED ORDER — INSULIN ASPART 100 UNIT/ML ~~LOC~~ SOLN: 0.0000 [IU] | Freq: Every day | SUBCUTANEOUS | Status: DC

## 2014-08-29 MED ORDER — INSULIN NPH (HUMAN) (ISOPHANE) 100 UNIT/ML ~~LOC~~ SUSP
20.0000 [IU] | Freq: Every day | SUBCUTANEOUS | Status: DC
  Administered 2014-08-29 – 2014-09-01 (×5): 20 [IU] via SUBCUTANEOUS
  Filled 2014-08-29 (×2): qty 10

## 2014-08-29 MED ORDER — ROSUVASTATIN CALCIUM 10 MG PO TABS
10.0000 mg | ORAL_TABLET | Freq: Every day | ORAL | Status: DC
  Administered 2014-08-29 – 2014-09-01 (×4): 10 mg via ORAL
  Filled 2014-08-29 (×5): qty 1

## 2014-08-29 NOTE — Progress Notes (Signed)
Patient is still unable to stand due to left knee pain, unable to obtain orthostatic vitals.

## 2014-08-29 NOTE — Progress Notes (Signed)
Attempted to obtain orthostatic VS twice. Patient is unable to stand, obtained lying and sitting BP and HR.

## 2014-08-29 NOTE — Progress Notes (Signed)
PT Cancellation Note  Patient Details Name: Giliana Norberg MRN: [id number] DOB: 1945/12/25   Cancelled Treatment:    Reason Eval/Treat Not Completed: Medical issues which prohibited therapy (MRI and further LE imaging ordered so will await results)   Chrystian Ressler,KATHrine E 08/29/2014, 1:01 PM Carmelia Bake, PT, DPT 08/29/2014 Pager: [phone]

## 2014-08-29 NOTE — Progress Notes (Signed)
  Echocardiogram 2D Echocardiogram has been performed.  Jennette Dubin 08/29/2014, 12:18 PM

## 2014-08-29 NOTE — Care Management Note (Signed)
Case Management Note  Patient Details  Name: [name] MRN: [id number] Date of Birth: 1945/08/15  Subjective/Objective: 69 y/o f admitted w/weakness.From home.                   Action/Plan:d/c plan home.   Expected Discharge Date:                  Expected Discharge Plan:  Home/Self Care  In-House Referral:     Discharge planning Services  CM Consult  Post Acute Care Choice:    Choice offered to:     DME Arranged:    DME Agency:     HH Arranged:    HH Agency:     Status of Service:  In process, will continue to follow  Medicare Important Message Given:    Date Medicare IM Given:    Medicare IM give by:    Date Additional Medicare IM Given:    Additional Medicare Important Message give by:     If discussed at [place] of Stay Meetings, dates discussed:    Additional Comments:  Dessa Phi, RN 08/29/2014, 3:18 PM

## 2014-08-29 NOTE — Plan of Care (Signed)
Problem: Phase I Progression Outcomes Goal: Voiding-avoid urinary catheter unless indicated Outcome: Not Met (add Reason) Patient retaining urine, MD notified and placed order for foley

## 2014-08-29 NOTE — Progress Notes (Signed)
RN called RT to assess PT - resulted in clear-diminished breath sounds, minimal WOB, Sp02  98% on 2 lpm [place], HR 71, RR 18. PT states she is breathing fine at this time. RN aware. Both encouraged to contact RT if changes occur.

## 2014-08-29 NOTE — Progress Notes (Signed)
CRITICAL VALUE ALERT  Critical value received:  Troponin 0.60  Date of notification:  08/29/2014  Time of notification:  0234  Critical value read back:Yes.    Nurse who received alert:  Raynelle Jan, RN  MD notified (1st page):  Lamar Blinks, NP  Time of first page:  0235  MD notified (2nd page):  Time of second page:  Responding MD:    Time MD responded:    NP on call aware

## 2014-08-29 NOTE — Progress Notes (Signed)
Utilization review completed.  

## 2014-08-29 NOTE — Progress Notes (Signed)
[facility]                                                                              Patient Demographics  [name], is a 69 y.o. female, DOB - [birth year], [id number]  Admit date - 08/28/2014   Admitting Physician Rise Patience, MD  Outpatient Primary MD for the patient is No PCP Per Patient  LOS -    Chief Complaint  Patient presents with  . Fall       Brief HPI    [name] is a 69 y.o. female with a history of CAD S/P PCI with Stent RCA (2011), IDDM, HTN, Seizure Disorder, and Hyperlipidemia who presented to the [facility] ED with complaints of increased weakness and multiple falls and slurred speech over the last 3-4 days. She reported increased pain in her Left Ankle and Left knee after her last fall. She denied any LOC and deniedany chest pain. A CT scan of the head was performed and was negative for acute findings, and X-rays of the Right Knee and Left ankle were negative for fracture.   Assessment & Plan    Principal Problem:   Weakness generalized - Unclear etiology, UA negative for UTI, CT head negative, patient is having frequent falls, multiple bruising sites  - Troponins positive, otherwise vitamin B12, TSH within normal limits - Obtain MRI of the brain to rule out any structural abnormality or CVA - Urine toxicology positive for benzodiazepine  - Will likely need skilled nursing facility  -  obtain left knee x-ray, left hip x-ray   Active Problems:  left knee pain, left leg pain - Obtain left knee x-ray, left hip x-ray. Left ankle and right knee negative for any fracture or dislocation  Elevated troponins - EKG with no ischemic changes, follow 2-D echo    Essential hypertension - Currently stable, continue Avapro, Lopressor    Hypothyroidism TSH -  1.98, not on Synthroid    CAD (coronary artery disease)- patient had RCA stent in 2011, per office notes, mid LAD narrowing - Continue beta blocker, ARB, Lasix, on  prasugrel, follow 2-D echocardiogram     Diabetes - Continue insulin regimen, follow CBGs closely, follow hemoglobin A1c    Falls - PTOT evaluation, will likely need skilled nursing facility    Seizure disorder - Continue Keppra   Morbid obesity   patient counseled on diet and weight control, she will need significant rehabilitation.     Code StatusFull code  Family Communication: Discussed in detail with the patient, all imaging results, lab results explained to the patient   Disposition Plan:   Time Spent in minutes   25 minutes  Procedures  CT head  Consults   None   DVT Prophylaxis  Lovenox  Medications  Scheduled Meds: . antiseptic oral rinse  7 mL Mouth Rinse BID  . aspirin EC  325 mg Oral Daily  . enoxaparin (LOVENOX) injection  60 mg Subcutaneous Daily  . furosemide  40 mg Oral q morning - 10a  . insulin aspart  0-5 Units Subcutaneous QHS  . insulin aspart  0-9 Units Subcutaneous TID WC  .  insulin NPH Human  20 Units Subcutaneous QHS  . irbesartan  150 mg Oral Daily  . levETIRAcetam  1,000 mg Oral BID  . metoprolol  50 mg Oral BID  . potassium chloride SA  20 mEq Oral q morning - 10a  . prasugrel  10 mg Oral Daily  . rosuvastatin  10 mg Oral Daily  . sertraline  100 mg Oral q morning - 10a  . sodium chloride  3 mL Intravenous Q12H   Continuous Infusions:  PRN Meds:.acetaminophen **OR** acetaminophen, albuterol, alum & mag hydroxide-simeth, HYDROmorphone (DILAUDID) injection, nitroGLYCERIN, ondansetron **OR** ondansetron (ZOFRAN) IV, oxyCODONE   Antibiotics   Anti-infectives    None        Subjective:   [name] was seen and examined today. Complaining of pain in the left knee and left hip, bruising on the back noted and on the right knee. Patient denies dizziness, chest pain, abdominal pain, N/V/D/C, new weakness, numbess, tingling. No acute events overnight.    Objective:   Blood pressure 147/69, pulse 82, temperature 98.5 F (36.9  C), temperature source Oral, resp. rate 22, height 5' (1.524 m), weight 112.6 kg (248 lb 3.8 oz), SpO2 100 %.  Wt Readings from Last 3 Encounters:  08/28/14 112.6 kg (248 lb 3.8 oz)  08/04/14 111.1 kg (244 lb 14.9 oz)  03/27/14 111.131 kg (245 lb)     Intake/Output Summary (Last 24 hours) at 08/29/14 1020 Last data filed at 08/29/14 0700  Gross per 24 hour  Intake    240 ml  Output      0 ml  Net    240 ml    Exam  General: Alert and oriented x 3, NAD  HEENT:  PERRLA, EOMI, Anicteric Sclera, mucous membranes moist.   Neck: Supple, no JVD, no masses  CVS: S1 S2 auscultated, no rubs, murmurs or gallops. Regular rate and rhythm.  Respiratory: Clear to auscultation bilaterally, no wheezing, rales or rhonchi  Abdomen:  morbidly obese,Soft, nontender, nondistended, + bowel sounds  Ext: no cyanosis clubbing or edema  Neuro: AAOx3, Cr N's II- XII. Strength 5/5 upper and lower extremities bilaterally  Skin: bruising noted on the back, right knee  Psych: Normal affect and demeanor, alert and oriented x3    Data Review   Micro Results No results found for this or any previous visit (from the past 240 hour(s)).  Radiology Reports Dg Chest 1 View  08/28/2014   CLINICAL DATA:  Status post fall, knee pain, ankle pain  EXAM: CHEST  1 VIEW  COMPARISON:  08/04/2014  FINDINGS: There is no focal parenchymal opacity. There is no pleural effusion or pneumothorax. There is stable cardiomegaly. There is thoracic aortic atherosclerosis.  The osseous structures are unremarkable.  IMPRESSION: No active disease.   Electronically Signed   By: Kathreen Devoid   On: 08/28/2014 17:04   Dg Chest 2 View  08/04/2014   CLINICAL DATA:  Acute chest pain today with shortness of breath.  EXAM: CHEST  2 VIEW  COMPARISON:  01/24/2012  FINDINGS: Mild cardiomegaly noted.  Mild pulmonary vascular congestion is present.  The may be a trace amount of pleural fluid in the right minor fissure.  There is no evidence  of pneumothorax, airspace disease or consolidation.  IMPRESSION: Mild cardiomegaly with pulmonary vascular congestion and trace right pleural fluid.   Electronically Signed   By: Margarette Canada M.D.   On: 08/04/2014 07:39   Dg Ankle Complete Left  08/28/2014   CLINICAL DATA:  Fall.  Weakness  EXAM: LEFT ANKLE COMPLETE - 3+ VIEW  COMPARISON:  None.  FINDINGS: Mild diffuse soft tissue swelling identified. There is no fracture or subluxation identified. No radio-opaque foreign body or soft tissue calcification. Small plantar heel spur noted.  IMPRESSION: 1. No acute bone abnormality. 2. Soft tissue swelling.   Electronically Signed   By: Kerby Moors M.D.   On: 08/28/2014 17:07   Ct Head Wo Contrast  08/28/2014   CLINICAL DATA:  Weakness with multiple areas of pain. Multiple falls with bruising, swelling and headaches. History of seizure disorder, diabetes and syncope. Initial encounter.  EXAM: CT HEAD WITHOUT CONTRAST  TECHNIQUE: Contiguous axial images were obtained from the base of the skull through the vertex without intravenous contrast.  COMPARISON:  08/04/2014 and 05/28/2010.  FINDINGS: Mild motion artifact. There is no evidence of acute intracranial hemorrhage, mass lesion, brain edema or extra-axial fluid collection. The ventricles and subarachnoid spaces remain appropriately sized for age. There is stable mild periventricular white matter disease. No evidence of acute infarct.  Probable chronic postsurgical changes in the left mastoid air cells. The visualized paranasal sinuses, mastoid air cells and middle ears are otherwise clear. The calvarium is intact.  IMPRESSION: Stable examination without acute intracranial or calvarial findings. Mild chronic periventricular white matter disease.   Electronically Signed   By: Richardean Sale M.D.   On: 08/28/2014 17:02   Ct Head Wo Contrast  08/04/2014   CLINICAL DATA:  Fall 2 weeks ago with seizure.  Bruising to face  EXAM: CT HEAD WITHOUT CONTRAST   TECHNIQUE: Contiguous axial images were obtained from the base of the skull through the vertex without intravenous contrast.  COMPARISON:  CT 05/28/2010  FINDINGS: No acute intracranial hemorrhage. No focal mass lesion. No CT evidence of acute infarction. No midline shift or mass effect. No hydrocephalus. Basilar cisterns are patent.  There is mild periventricular white matter hypodensities.  There is a small scalp hematoma over the LEFT frontal bone. No skull fracture.  Paranasal sinuses and  mastoid air cells are clear.  IMPRESSION: 1. No intracranial trauma. 2. Small LEFT frontal scalp hematoma. 3. Mild white matter microvascular disease.   Electronically Signed   By: Suzy Bouchard M.D.   On: 08/04/2014 09:15   Dg Knee Complete 4 Views Right  08/28/2014   CLINICAL DATA:  [place] multiple times today. Complains of right knee pain.  EXAM: RIGHT KNEE - COMPLETE 4+ VIEW  COMPARISON:  None.  FINDINGS: Medial joint space narrowing with osteophytosis. Extensive degenerative changes at the patellofemoral compartment. No definite joint effusion. Negative for an acute fracture or dislocation. Evidence for vascular calcifications.  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  No acute bone abnormality.   Electronically Signed   By: Markus Daft M.D.   On: 08/28/2014 17:02    CBC  Recent Labs Lab 08/28/14 1506 08/29/14 0546  WBC 11.2* 9.9  HGB 11.2* 10.7*  HCT 34.8* 33.1*  PLT 353 350  MCV 95.9 96.8  MCH 30.9 31.3  MCHC 32.2 32.3  RDW 13.8 14.7  LYMPHSABS 1.7  --   MONOABS 0.6  --   EOSABS 0.1  --   BASOSABS 0.0  --     Chemistries   Recent Labs Lab 08/28/14 1506 08/29/14 0546  NA 144 142  K 3.7 3.7  CL 107 107  CO2 29 31  GLUCOSE 149* 172*  BUN 20 21*  CREATININE 0.66 0.83  CALCIUM 9.0 8.7*  AST 26  --  ALT 14  --   ALKPHOS 68  --   BILITOT 0.4  --     ------------------------------------------------------------------------------------------------------------------ estimated creatinine clearance is 73 mL/min (by C-G formula based on Cr of 0.83). ------------------------------------------------------------------------------------------------------------------ No results for input(s): HGBA1C in the last 72 hours. ------------------------------------------------------------------------------------------------------------------ No results for input(s): CHOL, HDL, LDLCALC, TRIG, CHOLHDL, LDLDIRECT in the last 72 hours. ------------------------------------------------------------------------------------------------------------------  Recent Labs  08/29/14 0546  TSH 1.982   ------------------------------------------------------------------------------------------------------------------  Recent Labs  08/29/14 0104  VITAMINB12 1114*    Coagulation profile  Recent Labs Lab 08/28/14 1506  INR 1.10    No results for input(s): DDIMER in the last 72 hours.  Cardiac Enzymes  Recent Labs Lab 08/28/14 1506 08/29/14 0104 08/29/14 0546  TROPONINI 0.60* 0.60* 0.47*   ------------------------------------------------------------------------------------------------------------------ Invalid input(s): POCBNP   Recent Labs  08/28/14 1503 08/29/14 0026 08/29/14 0734  GLUCAP 136* 183* 118*     RAI,RIPUDEEP M.D. [facility] 08/29/2014, 10:20 AM  Pager: [phone] Between 7am to 7pm - call Pager - [phone]  After 7pm go to [email or web address] - password [email or web address]  Call night coverage person covering after 7pm

## 2014-08-30 DIAGNOSIS — R531 Weakness: Secondary | ICD-10-CM | POA: Diagnosis not present

## 2014-08-30 DIAGNOSIS — F419 Anxiety disorder, unspecified: Secondary | ICD-10-CM | POA: Diagnosis present

## 2014-08-30 DIAGNOSIS — R748 Abnormal levels of other serum enzymes: Secondary | ICD-10-CM | POA: Diagnosis not present

## 2014-08-30 DIAGNOSIS — Z955 Presence of coronary angioplasty implant and graft: Secondary | ICD-10-CM | POA: Diagnosis not present

## 2014-08-30 DIAGNOSIS — I10 Essential (primary) hypertension: Secondary | ICD-10-CM | POA: Diagnosis not present

## 2014-08-30 DIAGNOSIS — S82892D Other fracture of left lower leg, subsequent encounter for closed fracture with routine healing: Secondary | ICD-10-CM | POA: Diagnosis not present

## 2014-08-30 DIAGNOSIS — Z87891 Personal history of nicotine dependence: Secondary | ICD-10-CM | POA: Diagnosis not present

## 2014-08-30 DIAGNOSIS — E785 Hyperlipidemia, unspecified: Secondary | ICD-10-CM | POA: Diagnosis present

## 2014-08-30 DIAGNOSIS — Z6841 Body Mass Index (BMI) 40.0 and over, adult: Secondary | ICD-10-CM | POA: Diagnosis not present

## 2014-08-30 DIAGNOSIS — G40909 Epilepsy, unspecified, not intractable, without status epilepticus: Secondary | ICD-10-CM | POA: Diagnosis present

## 2014-08-30 DIAGNOSIS — Z794 Long term (current) use of insulin: Secondary | ICD-10-CM | POA: Diagnosis not present

## 2014-08-30 DIAGNOSIS — Z79899 Other long term (current) drug therapy: Secondary | ICD-10-CM | POA: Diagnosis not present

## 2014-08-30 DIAGNOSIS — Z833 Family history of diabetes mellitus: Secondary | ICD-10-CM | POA: Diagnosis not present

## 2014-08-30 DIAGNOSIS — I252 Old myocardial infarction: Secondary | ICD-10-CM | POA: Diagnosis not present

## 2014-08-30 DIAGNOSIS — R296 Repeated falls: Secondary | ICD-10-CM | POA: Diagnosis present

## 2014-08-30 DIAGNOSIS — S82192A Other fracture of upper end of left tibia, initial encounter for closed fracture: Secondary | ICD-10-CM | POA: Diagnosis not present

## 2014-08-30 DIAGNOSIS — N179 Acute kidney failure, unspecified: Secondary | ICD-10-CM | POA: Diagnosis not present

## 2014-08-30 DIAGNOSIS — Y92009 Unspecified place in unspecified non-institutional (private) residence as the place of occurrence of the external cause: Secondary | ICD-10-CM | POA: Diagnosis not present

## 2014-08-30 DIAGNOSIS — R4781 Slurred speech: Secondary | ICD-10-CM | POA: Diagnosis present

## 2014-08-30 DIAGNOSIS — Z8249 Family history of ischemic heart disease and other diseases of the circulatory system: Secondary | ICD-10-CM | POA: Diagnosis not present

## 2014-08-30 DIAGNOSIS — Z95818 Presence of other cardiac implants and grafts: Secondary | ICD-10-CM | POA: Diagnosis not present

## 2014-08-30 DIAGNOSIS — E039 Hypothyroidism, unspecified: Secondary | ICD-10-CM | POA: Diagnosis present

## 2014-08-30 DIAGNOSIS — E119 Type 2 diabetes mellitus without complications: Secondary | ICD-10-CM | POA: Diagnosis present

## 2014-08-30 DIAGNOSIS — S2239XA Fracture of one rib, unspecified side, initial encounter for closed fracture: Secondary | ICD-10-CM | POA: Diagnosis not present

## 2014-08-30 DIAGNOSIS — I2510 Atherosclerotic heart disease of native coronary artery without angina pectoris: Secondary | ICD-10-CM | POA: Diagnosis present

## 2014-08-30 DIAGNOSIS — Z9181 History of falling: Secondary | ICD-10-CM | POA: Diagnosis not present

## 2014-08-30 DIAGNOSIS — F329 Major depressive disorder, single episode, unspecified: Secondary | ICD-10-CM | POA: Diagnosis present

## 2014-08-30 DIAGNOSIS — Z7982 Long term (current) use of aspirin: Secondary | ICD-10-CM | POA: Diagnosis not present

## 2014-08-30 DIAGNOSIS — S82832A Other fracture of upper and lower end of left fibula, initial encounter for closed fracture: Secondary | ICD-10-CM | POA: Diagnosis present

## 2014-08-30 DIAGNOSIS — E1059 Type 1 diabetes mellitus with other circulatory complications: Secondary | ICD-10-CM | POA: Diagnosis not present

## 2014-08-30 DIAGNOSIS — K219 Gastro-esophageal reflux disease without esophagitis: Secondary | ICD-10-CM | POA: Diagnosis present

## 2014-08-30 DIAGNOSIS — M6281 Muscle weakness (generalized): Secondary | ICD-10-CM | POA: Diagnosis not present

## 2014-08-30 DIAGNOSIS — W19XXXA Unspecified fall, initial encounter: Secondary | ICD-10-CM | POA: Diagnosis present

## 2014-08-30 DIAGNOSIS — Z825 Family history of asthma and other chronic lower respiratory diseases: Secondary | ICD-10-CM | POA: Diagnosis not present

## 2014-08-30 LAB — BASIC METABOLIC PANEL
ANION GAP: 8 (ref 5–15)
BUN: 31 mg/dL — ABNORMAL HIGH (ref 6–20)
CHLORIDE: 102 mmol/L (ref 101–111)
CO2: 28 mmol/L (ref 22–32)
Calcium: 8.6 mg/dL — ABNORMAL LOW (ref 8.9–10.3)
Creatinine, Ser: 1.32 mg/dL — ABNORMAL HIGH (ref 0.44–1.00)
GFR calc non Af Amer: 40 mL/min — ABNORMAL LOW (ref >60)
GFR, EST AFRICAN AMERICAN: 47 mL/min — AB (ref >60)
Glucose, Bld: 132 mg/dL — ABNORMAL HIGH (ref 65–99)
Potassium: 4.3 mmol/L (ref 3.5–5.1)
SODIUM: 138 mmol/L (ref 135–145)

## 2014-08-30 LAB — CK: Total CK: 170 U/L (ref 38–234)

## 2014-08-30 LAB — GLUCOSE, CAPILLARY
GLUCOSE-CAPILLARY: 121 mg/dL — AB (ref 65–99)
GLUCOSE-CAPILLARY: 136 mg/dL — AB (ref 65–99)
GLUCOSE-CAPILLARY: 116 mg/dL — AB (ref 65–99)
Glucose-Capillary: 130 mg/dL — ABNORMAL HIGH (ref 65–99)

## 2014-08-30 LAB — HEMOGLOBIN A1C
Hgb A1c MFr Bld: 8.3 % — ABNORMAL HIGH (ref 4.8–5.6)
MEAN PLASMA GLUCOSE: 192 mg/dL

## 2014-08-30 MED ORDER — SODIUM CHLORIDE 0.9 % IV SOLN
INTRAVENOUS | Status: DC
  Administered 2014-08-30 – 2014-08-31 (×3): via INTRAVENOUS

## 2014-08-30 NOTE — Discharge Instructions (Signed)
WBAT LLE

## 2014-08-30 NOTE — Progress Notes (Addendum)
Urine output 30cc in past 5 hrs. Dr. Tana Coast text paged.  Lind Guest, RN  Dr. Tana Coast returned call. Will continue to observe pt.  Lind Guest, RN

## 2014-08-30 NOTE — Progress Notes (Signed)
PT Cancellation Note  Patient Details Name: [name] MRN: [id number] DOB: 1945/01/22   Cancelled Treatment:    Reason Eval/Treat Not Completed: Medical issues which prohibited therapy. Note imaging report indicating L prox fibula fracture. MD please indicate WB status for participation with PT. Thanks.    Weston Anna, MPT Pager: [phone]

## 2014-08-30 NOTE — Progress Notes (Signed)
PT Cancellation Note  Patient Details Name: Kumiko Sautter MRN: [id number] DOB: [birth year]   Cancelled Treatment:    Reason Eval/Treat Not Completed: Attempted PT eval-pt sleeping soundly and therapist was unable to awaken pt for participation with therapy. Also pt awaiting ortho consult. Will check back another day.    Weston Anna, MPT Pager: [phone]

## 2014-08-30 NOTE — Consult Note (Signed)
Reason for Consult: Left proximal fibula fracture  Referring Physician: Tyler Pita, MD  [name] is an 69 y.o. female.  HPI: Chief Complaint: Weakness and Increased Falls and slurred Speech  HPI: [name] is a 69 y.o. female with a history of CAD S/P PCI with Stent RCA (2011), IDDM, HTN, Seizure Disorder, and Hyperlipidemia who presented to the [facility] ED with complaints of increased weakness and multiple falls and slurred speech over the last 3-4 days. She reports increased pain in her Left Ankle and Left knee after her last fall. She denies any LOC and denies any chest pain. A CT scan of the head was performed and was negative for acute findings, and X-rays of the Right Knee and Left ankle were negative for fracture. She was referred for further evaluation   Reviewed with husband that she has had multiple falls recently.  Uncertain when exact incident happened to have caused this specific injury.  Severely deconditioned.  Husband unable to manage at home alone particularly if she continues to fall  Past Medical History  Diagnosis Date  . Syncope and collapse   . Seizure disorder   . Diabetes mellitus     insulin dependent  . Hyperlipidemia   . Hypertension   . Morbid obesity   . Depression   . Anxiety   . GERD (gastroesophageal reflux disease)   . Knee pain   . Coronary artery disease   . Fatigue   . SOB (shortness of breath)   . Orthopnea   . Edema of lower extremity   . Nonproductive cough     chronic  . Hiatal hernia 2008    EGD  . Esophageal stricture 2008    EGD  . Tremor   . Heart murmur     Past Surgical History  Procedure Laterality Date  . Knee arthroscopy    . Transthoracic echocardiogram      showed ef of 65% with no regional wall motion abnormalities  . Cardiac catheterization  03/28/2009  . Abdominal hysterectomy      Family History  Problem Relation Age of Onset  . Heart attack Mother   . Hypertension Mother   . COPD Father   . Heart  disease Sister   . Diabetes Brother   . Diabetes Brother   . Prostate cancer Brother   . Kidney disease Brother     Social History:  reports that she quit smoking about 44 years ago. She does not have any smokeless tobacco history on file. She reports that she does not drink alcohol or use illicit drugs.  Allergies:  Allergies  Allergen Reactions  . Codeine Itching    Medications:  I have reviewed the patient's current medications. Scheduled: . antiseptic oral rinse  7 mL Mouth Rinse BID  . aspirin EC  325 mg Oral Daily  . enoxaparin (LOVENOX) injection  60 mg Subcutaneous Daily  . insulin aspart  0-5 Units Subcutaneous QHS  . insulin aspart  0-9 Units Subcutaneous TID WC  . insulin NPH Human  20 Units Subcutaneous QHS  . levETIRAcetam  1,000 mg Oral BID  . metoprolol  50 mg Oral BID  . potassium chloride SA  20 mEq Oral q morning - 10a  . prasugrel  10 mg Oral Daily  . rosuvastatin  10 mg Oral Daily  . sertraline  100 mg Oral q morning - 10a  . sodium chloride  3 mL Intravenous Q12H    Results for orders placed or performed during  the hospital encounter of 08/28/14 (from the past 24 hour(s))  Glucose, capillary     Status: Abnormal   Collection Time: 08/29/14  4:44 PM  Result Value Ref Range   Glucose-Capillary 125 (H) 65 - 99 mg/dL  Glucose, capillary     Status: Abnormal   Collection Time: 08/29/14  9:25 PM  Result Value Ref Range   Glucose-Capillary 144 (H) 65 - 99 mg/dL  CK     Status: None   Collection Time: 08/30/14  5:20 AM  Result Value Ref Range   Total CK 170 38 - 234 U/L  Basic metabolic panel     Status: Abnormal   Collection Time: 08/30/14  5:20 AM  Result Value Ref Range   Sodium 138 135 - 145 mmol/L   Potassium 4.3 3.5 - 5.1 mmol/L   Chloride 102 101 - 111 mmol/L   CO2 28 22 - 32 mmol/L   Glucose, Bld 132 (H) 65 - 99 mg/dL   BUN 31 (H) 6 - 20 mg/dL   Creatinine, Ser 1.32 (H) 0.44 - 1.00 mg/dL   Calcium 8.6 (L) 8.9 - 10.3 mg/dL   GFR calc non Af  Amer 40 (L) >60 mL/min   GFR calc Af Amer 47 (L) >60 mL/min   Anion gap 8 5 - 15  Glucose, capillary     Status: Abnormal   Collection Time: 08/30/14  7:48 AM  Result Value Ref Range   Glucose-Capillary 121 (H) 65 - 99 mg/dL  Glucose, capillary     Status: Abnormal   Collection Time: 08/30/14 12:23 PM  Result Value Ref Range   Glucose-Capillary 136 (H) 65 - 99 mg/dL    X-ray: CLINICAL DATA: 69 year old female with syncope and fall today. Left hip and knee pain. Initial encounter.  EXAM: LEFT KNEE - COMPLETE 4+ VIEW  COMPARISON: None.  FINDINGS: Large body habitus. Advanced tricompartmental degenerative changes at the left knee. Severe medial compartment joint space loss and osteophytosis. Comminuted nondisplaced fracture of the left fibula head/meta diaphysis the tibial plateau appears intact. Patella and distal femur appear intact. Small to moderate suprapatellar joint effusion suspected. Calcified atherosclerosis in the left lower extremity. Dystrophic calcification posterior to the knee joint, but appears within the subcutaneous fat.  IMPRESSION: 1. Comminuted but nondisplaced proximal left fibula fracture. Joint effusion. 2. No other acute fracture or dislocation identified about the left knee. 3. Advanced degenerative changes.   Electronically Signed  By: Genevie Ann M.D.  ROS  Review of Systems: Constitutional: No Weight Loss, No Weight Gain, Night Sweats, Fevers, Chills, Dizziness, Light Headedness, Fatigue, +Generalized Weakness HEENT: No Headaches, Difficulty Swallowing,Tooth/Dental Problems,Sore Throat,  No Sneezing, Rhinitis, Ear Ache, Nasal Congestion, or Post Nasal Drip,  Cardio-vascular: No Chest pain, Orthopnea, PND, Edema in Lower Extremities, Anasarca, Dizziness, Palpitations  Resp: No Dyspnea, No DOE, No Productive Cough, No Non-Productive Cough, No Hemoptysis, No Wheezing.  GI: No Heartburn, Indigestion, Abdominal Pain, Nausea, Vomiting,  Diarrhea, Constipation, Hematemesis, Hematochezia, Melena, Change in Bowel Habits, Loss of Appetite  GU: No Dysuria, No Change in Color of Urine, No Urgency or Urinary Frequency, No Flank pain.  Musculoskeletal: No Joint Pain or Swelling, No Decreased Range of Motion, No Back Pain.  Neurologic: No Syncope, No Seizures, Muscle Weakness, Paresthesia, Vision Disturbance or Loss, No Diplopia, No Vertigo, No Difficulty Walking,  Skin: No Rash or Lesions. Psych: No Change in Mood or Affect, No Depression or Anxiety, No Memory loss, No Confusion, or Hallucinations  Blood pressure 94/51, pulse 69, temperature  98.6 F (37 C), temperature source Oral, resp. rate 20, height 5' (1.524 m), weight 112.6 kg (248 lb 3.8 oz), SpO2 100 %.  Physical Exam  Asleep Family in room. No need to wake her up for this problem No gross deformity obesity  Assessment/Plan: Left non-displaced proximal fibula fracture  Plan Weight bearing as tolerated LLE Follow up prn for radiographic assessment PT for eval and treat  Jac Romulus D 08/30/2014, 3:27 PM

## 2014-08-30 NOTE — Clinical Social Work Placement (Signed)
CSW provided SNF bed offers to patient's husband who plans to tour SNFs over the weekend, is leaning towards [place]. Anticipating possible discharge Monday.     Raynaldo Opitz, [facility] Clinical Social Worker cell #: [phone]    CLINICAL SOCIAL WORK PLACEMENT  NOTE  Date:  08/30/2014  Patient Details  Name: [name] MRN: [id number] Date of Birth: 09/20/1945  Clinical Social Work is seeking post-discharge placement for this patient at the [place] level of care (*CSW will initial, date and re-position this form in  chart as items are completed):  Yes   Patient/family provided with [facility] Work Department's list of facilities offering this level of care within the geographic area requested by the patient (or if unable, by the patient's family).  Yes   Patient/family informed of their freedom to choose among providers that offer the needed level of care, that participate in Medicare, Medicaid or managed care program needed by the patient, have an available bed and are willing to accept the patient.  Yes   Patient/family informed of [facility]'s ownership interest in [facility] and [facility], as well as of the fact that they are under no obligation to receive care at these facilities.  PASRR submitted to EDS on 08/30/14     PASRR number received on 08/30/14     Existing PASRR number confirmed on       FL2 transmitted to all facilities in geographic area requested by pt/family on 08/30/14     FL2 transmitted to all facilities within larger geographic area on       Patient informed that his/her managed care company has contracts with or will negotiate with certain facilities, including the following:        Yes   Patient/family informed of bed offers received.  Patient chooses bed at       Physician recommends and patient chooses bed at      Patient to be transferred to   on  .  Patient to be  transferred to facility by       Patient family notified on   of transfer.  Name of family member notified:        PHYSICIAN       Additional Comment:    _______________________________________________ Standley Brooking, LCSW 08/30/2014, 3:04 PM

## 2014-08-30 NOTE — Progress Notes (Signed)
Utilization review completed.  

## 2014-08-30 NOTE — Progress Notes (Signed)
[facility]                                                                              Patient Demographics  [name], is a 69 y.o. female, DOB - 1945/08/06, [id number]  Admit date - 08/28/2014   Admitting Physician Rise Patience, MD  Outpatient Primary MD for the patient is No PCP Per Patient  LOS -    Chief Complaint  Patient presents with  . Fall       Brief HPI    [name] is a 69 y.o. female with a history of CAD S/P PCI with Stent RCA (2011), IDDM, HTN, Seizure Disorder, and Hyperlipidemia who presented to the [facility] ED with complaints of increased weakness and multiple falls and slurred speech over the last 3-4 days. She reported increased pain in her Left Ankle and Left knee after her last fall. She denied any LOC and deniedany chest pain. A CT scan of the head was performed and was negative for acute findings, and X-rays of the Right Knee and Left ankle were negative for fracture.   Assessment & Plan    Principal Problem:   Weakness generalized - Unclear etiology, UA negative for UTI, CT head negative, patient is having frequent falls, multiple bruising sites  - Troponins positive, otherwise vitamin B12, TSH within normal limits - Obtain MRI of the brain to rule out any structural abnormality or CVA - Urine toxicology positive for benzodiazepine  - Will likely need skilled nursing facility  -  obtain left knee x-ray, left hip x-ray   Active Problems:  left knee pain, left proximal fibular fracture -  Left ankle and right knee negative for any fracture or dislocation - left knee Xray showed communited but nodisplaced proximal left fibular fracture - NPO, awaiting ortho recommendations  Elevated troponins - EKG with no ischemic changes - 2-D echo showed EF of 55-60%, normal wall motion, no regional wall motion abnormalities, grade 2 diastolic dysfunction    Essential hypertension - Currently stable, continue Avapro,  Lopressor    Hypothyroidism TSH -  1.98, not on Synthroid    CAD (coronary artery disease)- patient had RCA stent in 2011, per office notes, mid LAD narrowing - Continue beta blocker, ARB, Lasix, on prasugrel     Diabetes - Continue insulin regimen, follow CBGs closely,  - hemoglobin A1c 8.3   Falls - PTOT evaluation, will likely need skilled nursing facility    Seizure disorder - Continue Keppra   Morbid obesity   patient counseled on diet and weight control, she will need significant rehabilitation and SNF, patient agreeable.     Code StatusFull code  Family Communication: Discussed in detail with the patient, all imaging results, lab results explained to the patient and husband   Disposition Plan:   Time Spent in minutes   25 minutes  Procedures  CT head Knee xrays   Consults   None   DVT Prophylaxis  Lovenox  Medications  Scheduled Meds: . antiseptic oral rinse  7 mL Mouth Rinse BID  . aspirin EC  325 mg Oral Daily  . enoxaparin (LOVENOX) injection  60  mg Subcutaneous Daily  . Influenza vac split quadrivalent PF  0.5 mL Intramuscular Tomorrow-1000  . insulin aspart  0-5 Units Subcutaneous QHS  . insulin aspart  0-9 Units Subcutaneous TID WC  . insulin NPH Human  20 Units Subcutaneous QHS  . levETIRAcetam  1,000 mg Oral BID  . metoprolol  50 mg Oral BID  . potassium chloride SA  20 mEq Oral q morning - 10a  . prasugrel  10 mg Oral Daily  . rosuvastatin  10 mg Oral Daily  . sertraline  100 mg Oral q morning - 10a  . sodium chloride  3 mL Intravenous Q12H   Continuous Infusions:  PRN Meds:.acetaminophen **OR** acetaminophen, albuterol, alum & mag hydroxide-simeth, HYDROmorphone (DILAUDID) injection, nitroGLYCERIN, ondansetron **OR** ondansetron (ZOFRAN) IV, oxyCODONE   Antibiotics   Anti-infectives    None        Subjective:   [name] was seen and examined today. Complaining of pain in the left knee. Patient denies dizziness, chest pain,  abdominal pain, N/V/D/C, new weakness, numbess, tingling. No acute events overnight.    Objective:   Blood pressure 95/62, pulse 69, temperature 98 F (36.7 C), temperature source Oral, resp. rate 18, height 5' (1.524 m), weight 112.6 kg (248 lb 3.8 oz), SpO2 100 %.  Wt Readings from Last 3 Encounters:  08/28/14 112.6 kg (248 lb 3.8 oz)  08/29/14 112.492 kg (248 lb)  08/04/14 111.1 kg (244 lb 14.9 oz)     Intake/Output Summary (Last 24 hours) at 08/30/14 1244 Last data filed at 08/30/14 0856  Gross per 24 hour  Intake    723 ml  Output   1175 ml  Net   -452 ml    Exam  General: Alert and oriented x 3, NAD  HEENT:  PERRLA, EOMI  Neck: Supple, no JVD, no masses  CVS: S1 S2 clear, RRR  Respiratory: CTAB  Abdomen:  morbidly obese,Soft, NT, ND, NBS  Ext: no cyanosis clubbing or edema, tenderness left knee  Neuro: no new deficits  Skin: bruising noted on the back, right knee  Psych: Normal affect and demeanor, alert and oriented x3    Data Review   Micro Results No results found for this or any previous visit (from the past 240 hour(s)).  Radiology Reports Dg Chest 1 View  08/28/2014   CLINICAL DATA:  Status post fall, knee pain, ankle pain  EXAM: CHEST  1 VIEW  COMPARISON:  08/04/2014  FINDINGS: There is no focal parenchymal opacity. There is no pleural effusion or pneumothorax. There is stable cardiomegaly. There is thoracic aortic atherosclerosis.  The osseous structures are unremarkable.  IMPRESSION: No active disease.   Electronically Signed   By: Kathreen Devoid   On: 08/28/2014 17:04   Dg Chest 2 View  08/04/2014   CLINICAL DATA:  Acute chest pain today with shortness of breath.  EXAM: CHEST  2 VIEW  COMPARISON:  01/24/2012  FINDINGS: Mild cardiomegaly noted.  Mild pulmonary vascular congestion is present.  The may be a trace amount of pleural fluid in the right minor fissure.  There is no evidence of pneumothorax, airspace disease or consolidation.  IMPRESSION:  Mild cardiomegaly with pulmonary vascular congestion and trace right pleural fluid.   Electronically Signed   By: Margarette Canada M.D.   On: 08/04/2014 07:39   Dg Ankle Complete Left  08/28/2014   CLINICAL DATA:  Fall.  Weakness  EXAM: LEFT ANKLE COMPLETE - 3+ VIEW  COMPARISON:  None.  FINDINGS: Mild  diffuse soft tissue swelling identified. There is no fracture or subluxation identified. No radio-opaque foreign body or soft tissue calcification. Small plantar heel spur noted.  IMPRESSION: 1. No acute bone abnormality. 2. Soft tissue swelling.   Electronically Signed   By: Kerby Moors M.D.   On: 08/28/2014 17:07   Ct Head Wo Contrast  08/28/2014   CLINICAL DATA:  Weakness with multiple areas of pain. Multiple falls with bruising, swelling and headaches. History of seizure disorder, diabetes and syncope. Initial encounter.  EXAM: CT HEAD WITHOUT CONTRAST  TECHNIQUE: Contiguous axial images were obtained from the base of the skull through the vertex without intravenous contrast.  COMPARISON:  08/04/2014 and 05/28/2010.  FINDINGS: Mild motion artifact. There is no evidence of acute intracranial hemorrhage, mass lesion, brain edema or extra-axial fluid collection. The ventricles and subarachnoid spaces remain appropriately sized for age. There is stable mild periventricular white matter disease. No evidence of acute infarct.  Probable chronic postsurgical changes in the left mastoid air cells. The visualized paranasal sinuses, mastoid air cells and middle ears are otherwise clear. The calvarium is intact.  IMPRESSION: Stable examination without acute intracranial or calvarial findings. Mild chronic periventricular white matter disease.   Electronically Signed   By: Richardean Sale M.D.   On: 08/28/2014 17:02   Ct Head Wo Contrast  08/04/2014   CLINICAL DATA:  Fall 2 weeks ago with seizure.  Bruising to face  EXAM: CT HEAD WITHOUT CONTRAST  TECHNIQUE: Contiguous axial images were obtained from the base of the  skull through the vertex without intravenous contrast.  COMPARISON:  CT 05/28/2010  FINDINGS: No acute intracranial hemorrhage. No focal mass lesion. No CT evidence of acute infarction. No midline shift or mass effect. No hydrocephalus. Basilar cisterns are patent.  There is mild periventricular white matter hypodensities.  There is a small scalp hematoma over the LEFT frontal bone. No skull fracture.  Paranasal sinuses and  mastoid air cells are clear.  IMPRESSION: 1. No intracranial trauma. 2. Small LEFT frontal scalp hematoma. 3. Mild white matter microvascular disease.   Electronically Signed   By: Suzy Bouchard M.D.   On: 08/04/2014 09:15   Mr Brain Wo Contrast  08/29/2014   CLINICAL DATA:  69 year old female with slurred speech for several days, increased weakness. Current history of seizure disorder. Initial encounter.  EXAM: MRI HEAD WITHOUT CONTRAST  TECHNIQUE: Multiplanar, multiecho pulse sequences of the brain and surrounding structures were obtained without intravenous contrast.  COMPARISON:  Head CTs without contrast 08/28/2014 and earlier.  FINDINGS: Study is intermittently degraded by motion artifact despite repeated imaging attempts.  Cerebral volume is within normal limits for age. No restricted diffusion to suggest acute infarction. No midline shift, mass effect, evidence of mass lesion, ventriculomegaly, extra-axial collection or acute intracranial hemorrhage. Cervicomedullary junction and pituitary are within normal limits. Major intracranial vascular flow voids are preserved, the distal left vertebral artery appears dominant.  Mild to moderate for age nonspecific patchy cerebral white matter T2 and FLAIR hyperintensity. No cortical encephalomalacia or chronic cerebral blood products identified. Possible tiny chronic lacunar infarct in the right thalamus (series 6, image 14). Otherwise normal for age deep gray matter nuclei, brainstem, and cerebellum. Mesial temporal lobe structures appear  symmetric.  Mastoids and paranasal sinuses appear clear. Orbit and scalp soft tissues appear within normal limits. Large body habitus.  IMPRESSION: 1.  No acute intracranial abnormality. 2. Mild to moderate for age signal changes in the brain most commonly due to chronic small  vessel disease.   Electronically Signed   By: Genevie Ann M.D.   On: 08/29/2014 19:36   Dg Knee Complete 4 Views Left  08/29/2014   CLINICAL DATA:  69 year old female with syncope and fall today. Left hip and knee pain. Initial encounter.  EXAM: LEFT KNEE - COMPLETE 4+ VIEW  COMPARISON:  None.  FINDINGS: Large body habitus. Advanced tricompartmental degenerative changes at the left knee. Severe medial compartment joint space loss and osteophytosis. Comminuted nondisplaced fracture of the left fibula head/meta diaphysis the tibial plateau appears intact. Patella and distal femur appear intact. Small to moderate suprapatellar joint effusion suspected. Calcified atherosclerosis in the left lower extremity. Dystrophic calcification posterior to the knee joint, but appears within the subcutaneous fat.  IMPRESSION: 1. Comminuted but nondisplaced proximal left fibula fracture. Joint effusion. 2. No other acute fracture or dislocation identified about the left knee. 3. Advanced degenerative changes.   Electronically Signed   By: Genevie Ann M.D.   On: 08/29/2014 15:58   Dg Knee Complete 4 Views Right  08/28/2014   CLINICAL DATA:  [place] multiple times today. Complains of right knee pain.  EXAM: RIGHT KNEE - COMPLETE 4+ VIEW  COMPARISON:  None.  FINDINGS: Medial joint space narrowing with osteophytosis. Extensive degenerative changes at the patellofemoral compartment. No definite joint effusion. Negative for an acute fracture or dislocation. Evidence for vascular calcifications.  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  No acute bone abnormality.   Electronically Signed   By: Markus Daft M.D.   On: 08/28/2014 17:02   Dg Hip Unilat With  Pelvis 2-3 Views Left  08/29/2014   CLINICAL DATA:  Passed out and fell today.  Left hip and knee pain.  EXAM: DG HIP (WITH OR WITHOUT PELVIS) 2-3V LEFT  COMPARISON:  None.  FINDINGS: There is atherosclerotic calcification of the femoral arteries bilaterally. Transitional type anatomy at the lumbosacral junction. No evidence for acute fracture or subluxation.  IMPRESSION: No evidence for acute  abnormality.   Electronically Signed   By: Nolon Nations M.D.   On: 08/29/2014 15:56    CBC  Recent Labs Lab 08/28/14 1506 08/29/14 0546  WBC 11.2* 9.9  HGB 11.2* 10.7*  HCT 34.8* 33.1*  PLT 353 350  MCV 95.9 96.8  MCH 30.9 31.3  MCHC 32.2 32.3  RDW 13.8 14.7  LYMPHSABS 1.7  --   MONOABS 0.6  --   EOSABS 0.1  --   BASOSABS 0.0  --     Chemistries   Recent Labs Lab 08/28/14 1506 08/29/14 0546 08/30/14 0520  NA 144 142 138  K 3.7 3.7 4.3  CL 107 107 102  CO2 29 31 28   GLUCOSE 149* 172* 132*  BUN 20 21* 31*  CREATININE 0.66 0.83 1.32*  CALCIUM 9.0 8.7* 8.6*  AST 26  --   --   ALT 14  --   --   ALKPHOS 68  --   --   BILITOT 0.4  --   --    ------------------------------------------------------------------------------------------------------------------ estimated creatinine clearance is 45.9 mL/min (by C-G formula based on Cr of 1.32). ------------------------------------------------------------------------------------------------------------------  Recent Labs  08/29/14 0546  HGBA1C 8.3*   ------------------------------------------------------------------------------------------------------------------ No results for input(s): CHOL, HDL, LDLCALC, TRIG, CHOLHDL, LDLDIRECT in the last 72 hours. ------------------------------------------------------------------------------------------------------------------  Recent Labs  08/29/14 0546  TSH 1.982    ------------------------------------------------------------------------------------------------------------------  Recent Labs  08/29/14 0104  VITAMINB12 1114*    Coagulation profile  Recent Labs Lab 08/28/14 1506  INR 1.10  No results for input(s): DDIMER in the last 72 hours.  Cardiac Enzymes  Recent Labs Lab 08/29/14 0104 08/29/14 0546 08/29/14 1205  TROPONINI 0.60* 0.47* 0.47*   ------------------------------------------------------------------------------------------------------------------ Invalid input(s): POCBNP   Recent Labs  08/29/14 0734 08/29/14 1146 08/29/14 1644 08/29/14 2125 08/30/14 0748 08/30/14 [place] M.D. [facility] 08/30/2014, 12:44 PM  Pager: [phone] Between 7am to 7pm - call Pager - [phone]  After 7pm go to [email or web address] - password [email or web address]  Call night coverage person covering after 7pm

## 2014-08-30 NOTE — Clinical Social Work Note (Signed)
Clinical Social Work Assessment  Patient Details  Name: [name] MRN: [id number] Date of Birth: 07-Sep-1945  Date of referral:  08/30/14               Reason for consult:  Facility Placement                Permission sought to share information with:  Chartered certified accountant granted to share information::  Yes, Verbal Permission Granted  Name::        Agency::     Relationship::     Contact Information:     Housing/Transportation Living arrangements for the past 2 months:  Single Family Home Source of Information:  Spouse Patient Interpreter Needed:  None Criminal Activity/Legal Involvement Pertinent to Current Situation/Hospitalization:  No - Comment as needed Significant Relationships:  Spouse Lives with:  Spouse Do you feel safe going back to the place where you live?  Yes Need for family participation in patient care:  No (Coment)  Care giving concerns:  CSW received consult for SNF placement.    Social Worker assessment / plan:  CSW spoke with patient's husband, Cornelia Copa at bedside while patient was sleeping.   Employment status:  Retired Forensic scientist:  Medicare PT Recommendations:  Not assessed at this time Information / Referral to community resources:  [place]  Patient/Family's Response to care:  Patient's husband is agreeable with plan for SNF - states that he does not feel like he would be able to take care of her due to her falling.   Patient/Family's Understanding of and Emotional Response to Diagnosis, Current Treatment, and Prognosis:  Patient's husband is awaiting consult from ortho, is concerned about her multiple falls at home.   Emotional Assessment Appearance:  Appears stated age Attitude/Demeanor/Rapport:  Sedated Affect (typically observed):  Calm Orientation:  Oriented to Self, Oriented to Place, Oriented to  Time, Oriented to Situation Alcohol / Substance use:    Psych involvement (Current and /or in the  community):     Discharge Needs  Concerns to be addressed:    Readmission within the last 30 days:    Current discharge risk:    Barriers to Discharge:      Standley Brooking, LCSW 08/30/2014, 3:02 PM

## 2014-08-31 ENCOUNTER — Inpatient Hospital Stay (HOSPITAL_COMMUNITY): Payer: Medicare Other

## 2014-08-31 LAB — BASIC METABOLIC PANEL
Anion gap: 9 (ref 5–15)
BUN: 42 mg/dL — ABNORMAL HIGH (ref 6–20)
CO2: 24 mmol/L (ref 22–32)
Calcium: 8.3 mg/dL — ABNORMAL LOW (ref 8.9–10.3)
Chloride: 103 mmol/L (ref 101–111)
Creatinine, Ser: 1.78 mg/dL — ABNORMAL HIGH (ref 0.44–1.00)
GFR calc Af Amer: 32 mL/min — ABNORMAL LOW (ref >60)
GFR calc non Af Amer: 28 mL/min — ABNORMAL LOW (ref >60)
Glucose, Bld: 86 mg/dL (ref 65–99)
Potassium: 4.7 mmol/L (ref 3.5–5.1)
Sodium: 136 mmol/L (ref 135–145)

## 2014-08-31 LAB — GLUCOSE, CAPILLARY
GLUCOSE-CAPILLARY: 72 mg/dL (ref 65–99)
GLUCOSE-CAPILLARY: 78 mg/dL (ref 65–99)
Glucose-Capillary: 154 mg/dL — ABNORMAL HIGH (ref 65–99)
Glucose-Capillary: 174 mg/dL — ABNORMAL HIGH (ref 65–99)
Glucose-Capillary: 181 mg/dL — ABNORMAL HIGH (ref 65–99)
Glucose-Capillary: 150 mg/dL — ABNORMAL HIGH (ref 65–99)

## 2014-08-31 LAB — CREATININE, URINE, RANDOM: Creatinine, Urine: 156.95 mg/dL

## 2014-08-31 LAB — SODIUM, URINE, RANDOM: Sodium, Ur: 43 mmol/L

## 2014-08-31 LAB — CK: Total CK: 251 U/L — ABNORMAL HIGH (ref 38–234)

## 2014-08-31 MED ORDER — DIPHENHYDRAMINE HCL 25 MG PO CAPS
25.0000 mg | ORAL_CAPSULE | Freq: Four times a day (QID) | ORAL | Status: DC | PRN
  Administered 2014-08-31 – 2014-09-02 (×6): 25 mg via ORAL
  Filled 2014-08-31 (×8): qty 1

## 2014-08-31 MED ORDER — HYDROXYZINE HCL 25 MG PO TABS
25.0000 mg | ORAL_TABLET | Freq: Once | ORAL | Status: AC
  Administered 2014-08-31: 25 mg via ORAL

## 2014-08-31 NOTE — Progress Notes (Signed)
[facility]                                                                              Patient Demographics  [name], is a 69 y.o. female, DOB - [birth year], [id number]  Admit date - 08/28/2014   Admitting Physician Rise Patience, MD  Outpatient Primary MD for the patient is No PCP Per Patient  LOS -    Chief Complaint  Patient presents with  . Fall       Brief HPI    [name] is a 69 y.o. female with a history of CAD S/P PCI with Stent RCA (2011), IDDM, HTN, Seizure Disorder, and Hyperlipidemia who presented to the [facility] ED with complaints of increased weakness and multiple falls and slurred speech over the last 3-4 days. She reported increased pain in her Left Ankle and Left knee after her last fall. She denied any LOC and deniedany chest pain. A CT scan of the head was performed and was negative for acute findings, and X-rays of the Right Knee and Left ankle were negative for fracture.   Assessment & Plan    Principal Problem:   Weakness generalized - Unclear etiology, UA negative for UTI, CT head negative, patient is having frequent falls, multiple bruising sites  - Troponins positive, otherwise vitamin B12, TSH within normal limits - MRI of the brain negative for any CVA - Urine toxicology positive for benzodiazepine   Active Problems:  left knee pain, left proximal fibular fracture -  Left ankle and right knee negative for any fracture or dislocation - left knee Xray showed communited but nodisplaced proximal left fibular fracture - Per orthopedics, nonoperative, PT OT, weightbearing as tolerated  Acute kidney injury Continue to hold Diovan, Lasix Renal ultrasound showed no hydronephrosis or obstruction Continue IV fluid hydration, monitor closely to avoid any fluid overload  Elevated troponins-no chest pain  - EKG with no ischemic changes - 2-D echo showed EF of 55-60%, normal wall motion, no regional wall motion  abnormalities, grade 2 diastolic dysfunction    Essential hypertension - Currently stable, continue Lopressor    Hypothyroidism TSH -  1.98, not on Synthroid    CAD (coronary artery disease)- patient had RCA stent in 2011, per office notes, mid LAD narrowing - Continue beta blocker, on prasugrel     Diabetes: Insulin-dependent, CBGs currently controlled - Continue insulin regimen, follow CBGs closely,  - hemoglobin A1c 8.3    Falls - PTOT evaluation, will likely need skilled nursing facility    Seizure disorder - Continue Keppra   Morbid obesity   patient counseled on diet and weight control, she will need significant rehabilitation and SNF, patient agreeable.     Code StatusFull code  Family Communication: Discussed in detail with the patient, all imaging results, lab results explained to the patient    Disposition Plan: Will likely need skilled nursing facility   Time Spent in minutes   25 minutes  Procedures  CT head Knee xrays   Consults   None   DVT Prophylaxis  Lovenox  Medications  Scheduled Meds: . antiseptic oral rinse  7 mL Mouth  Rinse BID  . aspirin EC  325 mg Oral Daily  . enoxaparin (LOVENOX) injection  60 mg Subcutaneous Daily  . insulin aspart  0-5 Units Subcutaneous QHS  . insulin aspart  0-9 Units Subcutaneous TID WC  . insulin NPH Human  20 Units Subcutaneous QHS  . levETIRAcetam  1,000 mg Oral BID  . metoprolol  50 mg Oral BID  . potassium chloride SA  20 mEq Oral q morning - 10a  . prasugrel  10 mg Oral Daily  . rosuvastatin  10 mg Oral Daily  . sertraline  100 mg Oral q morning - 10a  . sodium chloride  3 mL Intravenous Q12H   Continuous Infusions: . sodium chloride 125 mL/hr at 08/31/14 0626   PRN Meds:.acetaminophen **OR** acetaminophen, albuterol, alum & mag hydroxide-simeth, diphenhydrAMINE, HYDROmorphone (DILAUDID) injection, nitroGLYCERIN, ondansetron **OR** ondansetron (ZOFRAN) IV, oxyCODONE   Antibiotics    Anti-infectives    None        Subjective:   [name] was seen and examined today.  somewhat better today however still has pain in the left knee, generalized weakness. No fevers or chills. Patient denies dizziness, chest pain, abdominal pain, N/V/D/C, new weakness, numbess, tingling. No acute events overnight.    Objective:   Blood pressure 111/57, pulse 82, temperature 98.1 F (36.7 C), temperature source Oral, resp. rate 18, height 5' (1.524 m), weight 112.6 kg (248 lb 3.8 oz), SpO2 100 %.  Wt Readings from Last 3 Encounters:  08/28/14 112.6 kg (248 lb 3.8 oz)  08/29/14 112.492 kg (248 lb)  08/04/14 111.1 kg (244 lb 14.9 oz)     Intake/Output Summary (Last 24 hours) at 08/31/14 1120 Last data filed at 08/31/14 0900  Gross per 24 hour  Intake 1750.83 ml  Output    500 ml  Net 1250.83 ml    Exam  General: Alert and oriented x 3, NAD  HEENT:  PERRLA, EOMI  Neck: Supple  CVS: S1 S2 clear, RRR  Respiratory: CTAB  Abdomen:  morbidly obese,Soft, NT, ND, NBS  Ext: no cyanosis clubbing or edema, tenderness left knee  Neuro: no new deficits  Skin: bruising noted on the back, right knee  Psych: Normal affect and demeanor, alert and oriented x3    Data Review   Micro Results No results found for this or any previous visit (from the past 240 hour(s)).  Radiology Reports Dg Chest 1 View  08/28/2014   CLINICAL DATA:  Status post fall, knee pain, ankle pain  EXAM: CHEST  1 VIEW  COMPARISON:  08/04/2014  FINDINGS: There is no focal parenchymal opacity. There is no pleural effusion or pneumothorax. There is stable cardiomegaly. There is thoracic aortic atherosclerosis.  The osseous structures are unremarkable.  IMPRESSION: No active disease.   Electronically Signed   By: Kathreen Devoid   On: 08/28/2014 17:04   Dg Chest 2 View  08/04/2014   CLINICAL DATA:  Acute chest pain today with shortness of breath.  EXAM: CHEST  2 VIEW  COMPARISON:  01/24/2012  FINDINGS:  Mild cardiomegaly noted.  Mild pulmonary vascular congestion is present.  The may be a trace amount of pleural fluid in the right minor fissure.  There is no evidence of pneumothorax, airspace disease or consolidation.  IMPRESSION: Mild cardiomegaly with pulmonary vascular congestion and trace right pleural fluid.   Electronically Signed   By: Margarette Canada M.D.   On: 08/04/2014 07:39   Dg Ankle Complete Left  08/28/2014   CLINICAL DATA:  Fall.  Weakness  EXAM: LEFT ANKLE COMPLETE - 3+ VIEW  COMPARISON:  None.  FINDINGS: Mild diffuse soft tissue swelling identified. There is no fracture or subluxation identified. No radio-opaque foreign body or soft tissue calcification. Small plantar heel spur noted.  IMPRESSION: 1. No acute bone abnormality. 2. Soft tissue swelling.   Electronically Signed   By: Kerby Moors M.D.   On: 08/28/2014 17:07   Ct Head Wo Contrast  08/28/2014   CLINICAL DATA:  Weakness with multiple areas of pain. Multiple falls with bruising, swelling and headaches. History of seizure disorder, diabetes and syncope. Initial encounter.  EXAM: CT HEAD WITHOUT CONTRAST  TECHNIQUE: Contiguous axial images were obtained from the base of the skull through the vertex without intravenous contrast.  COMPARISON:  08/04/2014 and 05/28/2010.  FINDINGS: Mild motion artifact. There is no evidence of acute intracranial hemorrhage, mass lesion, brain edema or extra-axial fluid collection. The ventricles and subarachnoid spaces remain appropriately sized for age. There is stable mild periventricular white matter disease. No evidence of acute infarct.  Probable chronic postsurgical changes in the left mastoid air cells. The visualized paranasal sinuses, mastoid air cells and middle ears are otherwise clear. The calvarium is intact.  IMPRESSION: Stable examination without acute intracranial or calvarial findings. Mild chronic periventricular white matter disease.   Electronically Signed   By: Richardean Sale M.D.    On: 08/28/2014 17:02   Ct Head Wo Contrast  08/04/2014   CLINICAL DATA:  Fall 2 weeks ago with seizure.  Bruising to face  EXAM: CT HEAD WITHOUT CONTRAST  TECHNIQUE: Contiguous axial images were obtained from the base of the skull through the vertex without intravenous contrast.  COMPARISON:  CT 05/28/2010  FINDINGS: No acute intracranial hemorrhage. No focal mass lesion. No CT evidence of acute infarction. No midline shift or mass effect. No hydrocephalus. Basilar cisterns are patent.  There is mild periventricular white matter hypodensities.  There is a small scalp hematoma over the LEFT frontal bone. No skull fracture.  Paranasal sinuses and  mastoid air cells are clear.  IMPRESSION: 1. No intracranial trauma. 2. Small LEFT frontal scalp hematoma. 3. Mild white matter microvascular disease.   Electronically Signed   By: Suzy Bouchard M.D.   On: 08/04/2014 09:15   Mr Brain Wo Contrast  08/29/2014   CLINICAL DATA:  69 year old female with slurred speech for several days, increased weakness. Current history of seizure disorder. Initial encounter.  EXAM: MRI HEAD WITHOUT CONTRAST  TECHNIQUE: Multiplanar, multiecho pulse sequences of the brain and surrounding structures were obtained without intravenous contrast.  COMPARISON:  Head CTs without contrast 08/28/2014 and earlier.  FINDINGS: Study is intermittently degraded by motion artifact despite repeated imaging attempts.  Cerebral volume is within normal limits for age. No restricted diffusion to suggest acute infarction. No midline shift, mass effect, evidence of mass lesion, ventriculomegaly, extra-axial collection or acute intracranial hemorrhage. Cervicomedullary junction and pituitary are within normal limits. Major intracranial vascular flow voids are preserved, the distal left vertebral artery appears dominant.  Mild to moderate for age nonspecific patchy cerebral white matter T2 and FLAIR hyperintensity. No cortical encephalomalacia or chronic  cerebral blood products identified. Possible tiny chronic lacunar infarct in the right thalamus (series 6, image 14). Otherwise normal for age deep gray matter nuclei, brainstem, and cerebellum. Mesial temporal lobe structures appear symmetric.  Mastoids and paranasal sinuses appear clear. Orbit and scalp soft tissues appear within normal limits. Large body habitus.  IMPRESSION: 1.  No acute intracranial  abnormality. 2. Mild to moderate for age signal changes in the brain most commonly due to chronic small vessel disease.   Electronically Signed   By: Genevie Ann M.D.   On: 08/29/2014 19:36   US Renal  08/31/2014   CLINICAL DATA:  Acute kidney injury N17.9 (ICD-10-CM)  EXAM: RENAL / URINARY TRACT ULTRASOUND COMPLETE  COMPARISON:  CT, 03/27/2009  FINDINGS: Right Kidney:  Length: 11.8 cm. Echogenicity within normal limits. No mass or hydronephrosis visualized.  Left Kidney:  Length: 11.9 cm. Not well visualized. Normal parenchymal echogenicity. No convincing mass or stone. No hydronephrosis.  Bladder:  Decompressed by Foley catheter.  IMPRESSION: 1. Normal sized kidneys.  No hydronephrosis.   Electronically Signed   By: Lajean Manes M.D.   On: 08/31/2014 11:10   Dg Knee Complete 4 Views Left  08/29/2014   CLINICAL DATA:  69 year old female with syncope and fall today. Left hip and knee pain. Initial encounter.  EXAM: LEFT KNEE - COMPLETE 4+ VIEW  COMPARISON:  None.  FINDINGS: Large body habitus. Advanced tricompartmental degenerative changes at the left knee. Severe medial compartment joint space loss and osteophytosis. Comminuted nondisplaced fracture of the left fibula head/meta diaphysis the tibial plateau appears intact. Patella and distal femur appear intact. Small to moderate suprapatellar joint effusion suspected. Calcified atherosclerosis in the left lower extremity. Dystrophic calcification posterior to the knee joint, but appears within the subcutaneous fat.  IMPRESSION: 1. Comminuted but nondisplaced  proximal left fibula fracture. Joint effusion. 2. No other acute fracture or dislocation identified about the left knee. 3. Advanced degenerative changes.   Electronically Signed   By: Genevie Ann M.D.   On: 08/29/2014 15:58   Dg Knee Complete 4 Views Right  08/28/2014   CLINICAL DATA:  [place] multiple times today. Complains of right knee pain.  EXAM: RIGHT KNEE - COMPLETE 4+ VIEW  COMPARISON:  None.  FINDINGS: Medial joint space narrowing with osteophytosis. Extensive degenerative changes at the patellofemoral compartment. No definite joint effusion. Negative for an acute fracture or dislocation. Evidence for vascular calcifications.  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  No acute bone abnormality.   Electronically Signed   By: Markus Daft M.D.   On: 08/28/2014 17:02   Dg Hip Unilat With Pelvis 2-3 Views Left  08/29/2014   CLINICAL DATA:  Passed out and fell today.  Left hip and knee pain.  EXAM: DG HIP (WITH OR WITHOUT PELVIS) 2-3V LEFT  COMPARISON:  None.  FINDINGS: There is atherosclerotic calcification of the femoral arteries bilaterally. Transitional type anatomy at the lumbosacral junction. No evidence for acute fracture or subluxation.  IMPRESSION: No evidence for acute  abnormality.   Electronically Signed   By: Nolon Nations M.D.   On: 08/29/2014 15:56    CBC  Recent Labs Lab 08/28/14 1506 08/29/14 0546  WBC 11.2* 9.9  HGB 11.2* 10.7*  HCT 34.8* 33.1*  PLT 353 350  MCV 95.9 96.8  MCH 30.9 31.3  MCHC 32.2 32.3  RDW 13.8 14.7  LYMPHSABS 1.7  --   MONOABS 0.6  --   EOSABS 0.1  --   BASOSABS 0.0  --     Chemistries   Recent Labs Lab 08/28/14 1506 08/29/14 0546 08/30/14 0520 08/31/14 0513  NA 144 142 138 136  K 3.7 3.7 4.3 4.7  CL 107 107 102 103  CO2 29 31 28 24   GLUCOSE 149* 172* 132* 86  BUN 20 21* 31* 42*  CREATININE 0.66 0.83 1.32*  1.78*  CALCIUM 9.0 8.7* 8.6* 8.3*  AST 26  --   --   --   ALT 14  --   --   --   ALKPHOS 68  --   --   --   BILITOT  0.4  --   --   --    ------------------------------------------------------------------------------------------------------------------ estimated creatinine clearance is 34 mL/min (by C-G formula based on Cr of 1.78). ------------------------------------------------------------------------------------------------------------------  Recent Labs  08/29/14 0546  HGBA1C 8.3*   ------------------------------------------------------------------------------------------------------------------ No results for input(s): CHOL, HDL, LDLCALC, TRIG, CHOLHDL, LDLDIRECT in the last 72 hours. ------------------------------------------------------------------------------------------------------------------  Recent Labs  08/29/14 0546  TSH 1.982   ------------------------------------------------------------------------------------------------------------------  Recent Labs  08/29/14 0104  VITAMINB12 1114*    Coagulation profile  Recent Labs Lab 08/28/14 1506  INR 1.10    No results for input(s): DDIMER in the last 72 hours.  Cardiac Enzymes  Recent Labs Lab 08/29/14 0104 08/29/14 0546 08/29/14 1205  TROPONINI 0.60* 0.47* 0.47*   ------------------------------------------------------------------------------------------------------------------ Invalid input(s): POCBNP   Recent Labs  08/30/14 1223 08/30/14 1634 08/30/14 2042 08/31/14 0801 08/31/14 0855 08/31/14 1012  GLUCAP 136* 116* 130* 72 50 154*     RAI,RIPUDEEP M.D. [facility] 08/31/2014, 11:20 AM  Pager: [phone] Between 7am to 7pm - call Pager - [phone]  After 7pm go to [email or web address] - password [email or web address]  Call night coverage person covering after 7pm

## 2014-08-31 NOTE — Evaluation (Signed)
Physical Therapy Evaluation Patient Details Name: [name] MRN: [id number] DOB: 1945/04/05 Today's Date: 08/31/2014   History of Present Illness  [name] is a 69 y.o. female with a history of CAD S/P PCI with Stent RCA (2011), IDDM, HTN, Seizure Disorder, and Hyperlipidemia who presented to the [facility] ED with complaints of increased weakness and multiple falls and slurred speech over the last 3-4 days.CT was negative for acute findings and pt found to have L proximal fibular fracture.  Clinical Impression  Pt admitted with above diagnosis. Pt currently with functional limitations due to the deficits listed below (see PT Problem List).  Pt will benefit from skilled PT to increase their independence and safety with mobility to allow discharge to the venue listed below.  Recommend SNF for rehab as pt is +2 for bed mobility and pt with frequent falls at home including ones from the bed per husband.     Follow Up Recommendations SNF;Supervision/Assistance - 24 hour    Equipment Recommendations  None recommended by PT    Recommendations for Other Services       Precautions / Restrictions Precautions Precautions: Fall Restrictions Weight Bearing Restrictions: Yes LLE Weight Bearing: Weight bearing as tolerated      Mobility  Bed Mobility Overal bed mobility: +2 for physical assistance;Needs Assistance Bed Mobility: Rolling;Supine to Sit;Sit to Supine Rolling: Max assist   Supine to sit: Max assist;+2 for physical assistance Sit to supine: Max assist;+2 for physical assistance   General bed mobility comments: Pt attempting to help move R LE and use rail for upper body, but still needed MAX of 2 to transfer supine <> Sit.  Unable to re-position self at EOB.  Pt's level of alertness fluctuated and falling asleep during session.  Pt very itchy and nurse gave some benadryl during session.  Transfers                 General transfer comment: deferred attempts at standing  due to pain level and alertness level.  Ambulation/Gait             General Gait Details: NT  Stairs            Wheelchair Mobility    Modified Rankin (Stroke Patients Only)       Balance Overall balance assessment: Needs assistance Sitting-balance support: Bilateral upper extremity supported Sitting balance-Leahy Scale: Poor Sitting balance - Comments: Sitting balance fluctuated between poor to fair.     Standing balance-Leahy Scale: Zero                               Pertinent Vitals/Pain Pain Assessment: Faces Faces Pain Scale: Hurts whole lot Pain Location: L Leg Pain Descriptors / Indicators: Grimacing;Guarding;Moaning Pain Intervention(s): Limited activity within patient's tolerance;Patient requesting pain meds-RN notified    Home Living Family/patient expects to be discharged to:: Skilled nursing facility Living Arrangements: Spouse/significant other                    Prior Function Level of Independence: Independent with assistive device(s)         Comments: Pt would ambulate with RW at times, but husband reports she would leave it frequently.     Hand Dominance        Extremity/Trunk Assessment               Lower Extremity Assessment: LLE deficits/detail;Generalized weakness  Communication   Communication: No difficulties  Cognition Arousal/Alertness: Lethargic;Suspect due to medications Behavior During Therapy: Flat affect Overall Cognitive Status: Impaired/Different from baseline Area of Impairment: Memory               General Comments: Pt doesn't know why she is in the hospital.    General Comments General comments (skin integrity, edema, etc.): Bruising noted lightish green/straw color on R knee and R foot.  Dark red bruising on low back with more on R than L and also on R buttocks. Husband reports pt fell 4x in one morning and that is why they came to the hospital.  States she has had  too many falls to count in last 6 months.    Exercises Total Joint Exercises Ankle Circles/Pumps: AROM;Both;5 reps Heel Slides: AROM;Right;5 reps;Supine Long Arc Quad: AROM;Right;5 reps;Seated      Assessment/Plan    PT Assessment Patient needs continued PT services  PT Diagnosis Acute pain;Generalized weakness   PT Problem List Decreased strength;Decreased range of motion;Decreased activity tolerance;Decreased balance;Decreased mobility;Pain  PT Treatment Interventions Gait training;Functional mobility training;Therapeutic activities;Therapeutic exercise;DME instruction;Balance training   PT Goals (Current goals can be found in the Care Plan section) Acute Rehab PT Goals Patient Stated Goal: Rehab before going home PT Goal Formulation: With family Time For Goal Achievement: 09/14/14 Potential to Achieve Goals: Good    Frequency Min 3X/week   Barriers to discharge        Co-evaluation               End of Session   Activity Tolerance: Patient limited by lethargy;Patient limited by pain Patient left: in bed;with call bell/phone within reach;with bed alarm set Nurse Communication: Mobility status;Patient requests pain meds         Time: [id number] PT Time Calculation (min) (ACUTE ONLY): 26 min   Charges:   PT Evaluation $Initial PT Evaluation Tier I: 1 Procedure PT Treatments $Therapeutic Activity: 8-22 mins   PT G Codes:        [name] 08/31/2014, 12:20 PM

## 2014-08-31 NOTE — Progress Notes (Signed)
Hypoglycemic Event  CBG: 72  Treatment: 4 oz orange juice PO  Symptoms: none  Follow-up CBG: Time:0855 CBG Result:78  4 oz orange juice and meal given. 1012- CBG 154  Possible Reasons for Event: Pt with decreased PO intake  Comments/MD notified:Dr. Verne Grain  Remember to initiate Hypoglycemia Order Set & complete

## 2014-08-31 NOTE — Progress Notes (Signed)
RN observed small blood clot pass through foley tubing. Urine was pink around clot but returned to dark yellow shortly after. On call NP, Jonette Eva paged regarding this. Carnella [place] I

## 2014-09-01 LAB — BASIC METABOLIC PANEL
ANION GAP: 8 (ref 5–15)
ANION GAP: 4 — AB (ref 5–15)
BUN: 43 mg/dL — ABNORMAL HIGH (ref 6–20)
BUN: 45 mg/dL — ABNORMAL HIGH (ref 6–20)
CHLORIDE: 107 mmol/L (ref 101–111)
CO2: 21 mmol/L — AB (ref 22–32)
CO2: 24 mmol/L (ref 22–32)
Calcium: 7.8 mg/dL — ABNORMAL LOW (ref 8.9–10.3)
Calcium: 7.8 mg/dL — ABNORMAL LOW (ref 8.9–10.3)
Chloride: 106 mmol/L (ref 101–111)
Creatinine, Ser: 1.18 mg/dL — ABNORMAL HIGH (ref 0.44–1.00)
Creatinine, Ser: 1.19 mg/dL — ABNORMAL HIGH (ref 0.44–1.00)
GFR calc Af Amer: 53 mL/min — ABNORMAL LOW (ref >60)
GFR, EST AFRICAN AMERICAN: 53 mL/min — AB (ref >60)
GFR, EST NON AFRICAN AMERICAN: 46 mL/min — AB (ref >60)
GFR, EST NON AFRICAN AMERICAN: 45 mL/min — AB (ref >60)
GLUCOSE: 158 mg/dL — AB (ref 65–99)
GLUCOSE: 147 mg/dL — AB (ref 65–99)
POTASSIUM: 5.4 mmol/L — AB (ref 3.5–5.1)
POTASSIUM: 4.9 mmol/L (ref 3.5–5.1)
Sodium: 135 mmol/L (ref 135–145)
Sodium: 135 mmol/L (ref 135–145)

## 2014-09-01 LAB — HEPATIC FUNCTION PANEL
ALBUMIN: 2.9 g/dL — AB (ref 3.5–5.0)
ALT: 15 U/L (ref 14–54)
AST: 29 U/L (ref 15–41)
Alkaline Phosphatase: 58 U/L (ref 38–126)
BILIRUBIN TOTAL: 0.3 mg/dL (ref 0.3–1.2)
Bilirubin, Direct: <0.1 mg/dL — ABNORMAL LOW (ref 0.1–0.5)
Total Protein: 6.6 g/dL (ref 6.5–8.1)

## 2014-09-01 LAB — GLUCOSE, CAPILLARY
GLUCOSE-CAPILLARY: 121 mg/dL — AB (ref 65–99)
Glucose-Capillary: 116 mg/dL — ABNORMAL HIGH (ref 65–99)
Glucose-Capillary: 118 mg/dL — ABNORMAL HIGH (ref 65–99)
Glucose-Capillary: 135 mg/dL — ABNORMAL HIGH (ref 65–99)

## 2014-09-01 MED ORDER — HYDROXYZINE HCL 10 MG PO TABS
10.0000 mg | ORAL_TABLET | Freq: Three times a day (TID) | ORAL | Status: DC | PRN
  Administered 2014-09-01: 10 mg via ORAL
  Filled 2014-09-01 (×3): qty 1

## 2014-09-01 MED ORDER — DIPHENHYDRAMINE HCL 25 MG PO CAPS
25.0000 mg | ORAL_CAPSULE | ORAL | Status: AC
  Administered 2014-09-01: 25 mg via ORAL
  Filled 2014-09-01: qty 1

## 2014-09-01 NOTE — Progress Notes (Signed)
[facility]                                                                              Patient Demographics  [name], is a 69 y.o. female, DOB - September 02, 1945, [id number]  Admit date - 08/28/2014   Admitting Physician Rise Patience, MD  Outpatient Primary MD for the patient is No PCP Per Patient  LOS -    Chief Complaint  Patient presents with  . Fall       Brief HPI    [name] is a 69 y.o. female with a history of CAD S/P PCI with Stent RCA (2011), IDDM, HTN, Seizure Disorder, and Hyperlipidemia who presented to the [facility] ED with complaints of increased weakness and multiple falls and slurred speech over the last 3-4 days. She reported increased pain in her Left Ankle and Left knee after her last fall. She denied any LOC and deniedany chest pain. A CT scan of the head was performed and was negative for acute findings, and X-rays of the Right Knee and Left ankle were negative for fracture.   Assessment & Plan    Principal Problem:   Weakness generalized- improving - Unclear etiology, UA negative for UTI, CT head negative, patient is having frequent falls, multiple bruising sites  - Troponins slightly positive, otherwise vitamin B12, TSH within normal limits - MRI of the brain negative for any CVA - Urine toxicology positive for benzodiazepine   Active Problems:  left knee pain, left proximal fibular fracture -  Left ankle and right knee negative for any fracture or dislocation - left knee Xray showed communited but nodisplaced proximal left fibular fracture - Per orthopedics, nonoperative, PT OT, weightbearing as tolerated - PT evaluation recommending skilled nursing facility, family requesting [facility]  Acute kidney injury- resolved Continue to hold Diovan, Lasix Renal ultrasound showed no hydronephrosis or obstruction Discontinued IV fluids  Elevated troponins-no chest pain  - EKG with no ischemic changes - 2-D  echo showed EF of 55-60%, normal wall motion, no regional wall motion abnormalities, grade 2 diastolic dysfunction    Essential hypertension - Currently stable, continue Lopressor    Hypothyroidism TSH -  1.98, not on Synthroid    CAD (coronary artery disease)- patient had RCA stent in 2011, per office notes, mid LAD narrowing - Continue beta blocker, on prasugrel     Diabetes: Insulin-dependent,  - CBGs currently controlled, continue sliding scale insulin - hemoglobin A1c 8.3    Falls - PTOT evaluation-> skilled nursing facility    Seizure disorder - Continue Keppra   Morbid obesity   patient counseled on diet and weight control, she will need significant rehabilitation and SNF, patient agreeable.     Code StatusFull code  Family Communication: Discussed in detail with the patient, all imaging results, lab results explained to the patient , daughter and husband at the bedside   Disposition Plan: [address], likely tomorrow  Time Spent in minutes   25 minutes  Procedures  CT head Knee xrays   Consults   None   DVT Prophylaxis  Lovenox  Medications  Scheduled Meds: . antiseptic oral  rinse  7 mL Mouth Rinse BID  . aspirin EC  325 mg Oral Daily  . enoxaparin (LOVENOX) injection  60 mg Subcutaneous Daily  . insulin aspart  0-5 Units Subcutaneous QHS  . insulin aspart  0-9 Units Subcutaneous TID WC  . insulin NPH Human  20 Units Subcutaneous QHS  . levETIRAcetam  1,000 mg Oral BID  . metoprolol  50 mg Oral BID  . potassium chloride SA  20 mEq Oral q morning - 10a  . prasugrel  10 mg Oral Daily  . rosuvastatin  10 mg Oral Daily  . sertraline  100 mg Oral q morning - 10a  . sodium chloride  3 mL Intravenous Q12H   Continuous Infusions:   PRN Meds:.acetaminophen **OR** acetaminophen, albuterol, alum & mag hydroxide-simeth, diphenhydrAMINE, HYDROmorphone (DILAUDID) injection, nitroGLYCERIN, ondansetron **OR** ondansetron (ZOFRAN) IV,  oxyCODONE   Antibiotics   Anti-infectives    None        Subjective:   [name] was seen and examined today. Feels a lot better, more alert and awake, pain in the left knee +. Patient denies dizziness, chest pain, abdominal pain, N/V/D/C, new weakness, numbess, tingling. No acute events overnight.    Objective:   Blood pressure 99/60, pulse 72, temperature 98.5 F (36.9 C), temperature source Oral, resp. rate 22, height 5' (1.524 m), weight 112.6 kg (248 lb 3.8 oz), SpO2 97 %.  Wt Readings from Last 3 Encounters:  08/28/14 112.6 kg (248 lb 3.8 oz)  08/29/14 112.492 kg (248 lb)  08/04/14 111.1 kg (244 lb 14.9 oz)     Intake/Output Summary (Last 24 hours) at 09/01/14 1004 Last data filed at 09/01/14 0948  Gross per 24 hour  Intake 2512.58 ml  Output    895 ml  Net 1617.58 ml    Exam  General: Alert and oriented x 3, NAD  HEENT:  PERRLA, EOMI  Neck: Supple  CVS: S1 S2 clear, RRR  Respiratory: CTAB  Abdomen:  morbidly obese,Soft, NT, ND, NBS  Ext: no c/c/e, tenderness left knee  Neuro: no new deficits  Skin: bruising noted on the back, right knee  Psych: Normal affect and demeanor, alert and oriented x3    Data Review   Micro Results No results found for this or any previous visit (from the past 240 hour(s)).  Radiology Reports Dg Chest 1 View  08/28/2014   CLINICAL DATA:  Status post fall, knee pain, ankle pain  EXAM: CHEST  1 VIEW  COMPARISON:  08/04/2014  FINDINGS: There is no focal parenchymal opacity. There is no pleural effusion or pneumothorax. There is stable cardiomegaly. There is thoracic aortic atherosclerosis.  The osseous structures are unremarkable.  IMPRESSION: No active disease.   Electronically Signed   By: Kathreen Devoid   On: 08/28/2014 17:04   Dg Chest 2 View  08/04/2014   CLINICAL DATA:  Acute chest pain today with shortness of breath.  EXAM: CHEST  2 VIEW  COMPARISON:  01/24/2012  FINDINGS: Mild cardiomegaly noted.  Mild  pulmonary vascular congestion is present.  The may be a trace amount of pleural fluid in the right minor fissure.  There is no evidence of pneumothorax, airspace disease or consolidation.  IMPRESSION: Mild cardiomegaly with pulmonary vascular congestion and trace right pleural fluid.   Electronically Signed   By: Margarette Canada M.D.   On: 08/04/2014 07:39   Dg Ankle Complete Left  08/28/2014   CLINICAL DATA:  Fall.  Weakness  EXAM: LEFT ANKLE COMPLETE -  3+ VIEW  COMPARISON:  None.  FINDINGS: Mild diffuse soft tissue swelling identified. There is no fracture or subluxation identified. No radio-opaque foreign body or soft tissue calcification. Small plantar heel spur noted.  IMPRESSION: 1. No acute bone abnormality. 2. Soft tissue swelling.   Electronically Signed   By: Kerby Moors M.D.   On: 08/28/2014 17:07   Ct Head Wo Contrast  08/28/2014   CLINICAL DATA:  Weakness with multiple areas of pain. Multiple falls with bruising, swelling and headaches. History of seizure disorder, diabetes and syncope. Initial encounter.  EXAM: CT HEAD WITHOUT CONTRAST  TECHNIQUE: Contiguous axial images were obtained from the base of the skull through the vertex without intravenous contrast.  COMPARISON:  08/04/2014 and 05/28/2010.  FINDINGS: Mild motion artifact. There is no evidence of acute intracranial hemorrhage, mass lesion, brain edema or extra-axial fluid collection. The ventricles and subarachnoid spaces remain appropriately sized for age. There is stable mild periventricular white matter disease. No evidence of acute infarct.  Probable chronic postsurgical changes in the left mastoid air cells. The visualized paranasal sinuses, mastoid air cells and middle ears are otherwise clear. The calvarium is intact.  IMPRESSION: Stable examination without acute intracranial or calvarial findings. Mild chronic periventricular white matter disease.   Electronically Signed   By: Richardean Sale M.D.   On: 08/28/2014 17:02   Ct  Head Wo Contrast  08/04/2014   CLINICAL DATA:  Fall 2 weeks ago with seizure.  Bruising to face  EXAM: CT HEAD WITHOUT CONTRAST  TECHNIQUE: Contiguous axial images were obtained from the base of the skull through the vertex without intravenous contrast.  COMPARISON:  CT 05/28/2010  FINDINGS: No acute intracranial hemorrhage. No focal mass lesion. No CT evidence of acute infarction. No midline shift or mass effect. No hydrocephalus. Basilar cisterns are patent.  There is mild periventricular white matter hypodensities.  There is a small scalp hematoma over the LEFT frontal bone. No skull fracture.  Paranasal sinuses and  mastoid air cells are clear.  IMPRESSION: 1. No intracranial trauma. 2. Small LEFT frontal scalp hematoma. 3. Mild white matter microvascular disease.   Electronically Signed   By: Suzy Bouchard M.D.   On: 08/04/2014 09:15   Mr Brain Wo Contrast  08/29/2014   CLINICAL DATA:  70 year old female with slurred speech for several days, increased weakness. Current history of seizure disorder. Initial encounter.  EXAM: MRI HEAD WITHOUT CONTRAST  TECHNIQUE: Multiplanar, multiecho pulse sequences of the brain and surrounding structures were obtained without intravenous contrast.  COMPARISON:  Head CTs without contrast 08/28/2014 and earlier.  FINDINGS: Study is intermittently degraded by motion artifact despite repeated imaging attempts.  Cerebral volume is within normal limits for age. No restricted diffusion to suggest acute infarction. No midline shift, mass effect, evidence of mass lesion, ventriculomegaly, extra-axial collection or acute intracranial hemorrhage. Cervicomedullary junction and pituitary are within normal limits. Major intracranial vascular flow voids are preserved, the distal left vertebral artery appears dominant.  Mild to moderate for age nonspecific patchy cerebral white matter T2 and FLAIR hyperintensity. No cortical encephalomalacia or chronic cerebral blood products  identified. Possible tiny chronic lacunar infarct in the right thalamus (series 6, image 14). Otherwise normal for age deep gray matter nuclei, brainstem, and cerebellum. Mesial temporal lobe structures appear symmetric.  Mastoids and paranasal sinuses appear clear. Orbit and scalp soft tissues appear within normal limits. Large body habitus.  IMPRESSION: 1.  No acute intracranial abnormality. 2. Mild to moderate for age signal changes  in the brain most commonly due to chronic small vessel disease.   Electronically Signed   By: Genevie Ann M.D.   On: 08/29/2014 19:36   US Renal  08/31/2014   CLINICAL DATA:  Acute kidney injury N17.9 (ICD-10-CM)  EXAM: RENAL / URINARY TRACT ULTRASOUND COMPLETE  COMPARISON:  CT, 03/27/2009  FINDINGS: Right Kidney:  Length: 11.8 cm. Echogenicity within normal limits. No mass or hydronephrosis visualized.  Left Kidney:  Length: 11.9 cm. Not well visualized. Normal parenchymal echogenicity. No convincing mass or stone. No hydronephrosis.  Bladder:  Decompressed by Foley catheter.  IMPRESSION: 1. Normal sized kidneys.  No hydronephrosis.   Electronically Signed   By: Lajean Manes M.D.   On: 08/31/2014 11:10   Dg Knee Complete 4 Views Left  08/29/2014   CLINICAL DATA:  69 year old female with syncope and fall today. Left hip and knee pain. Initial encounter.  EXAM: LEFT KNEE - COMPLETE 4+ VIEW  COMPARISON:  None.  FINDINGS: Large body habitus. Advanced tricompartmental degenerative changes at the left knee. Severe medial compartment joint space loss and osteophytosis. Comminuted nondisplaced fracture of the left fibula head/meta diaphysis the tibial plateau appears intact. Patella and distal femur appear intact. Small to moderate suprapatellar joint effusion suspected. Calcified atherosclerosis in the left lower extremity. Dystrophic calcification posterior to the knee joint, but appears within the subcutaneous fat.  IMPRESSION: 1. Comminuted but nondisplaced proximal left fibula  fracture. Joint effusion. 2. No other acute fracture or dislocation identified about the left knee. 3. Advanced degenerative changes.   Electronically Signed   By: Genevie Ann M.D.   On: 08/29/2014 15:58   Dg Knee Complete 4 Views Right  08/28/2014   CLINICAL DATA:  [place] multiple times today. Complains of right knee pain.  EXAM: RIGHT KNEE - COMPLETE 4+ VIEW  COMPARISON:  None.  FINDINGS: Medial joint space narrowing with osteophytosis. Extensive degenerative changes at the patellofemoral compartment. No definite joint effusion. Negative for an acute fracture or dislocation. Evidence for vascular calcifications.  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  No acute bone abnormality.   Electronically Signed   By: Markus Daft M.D.   On: 08/28/2014 17:02   Dg Hip Unilat With Pelvis 2-3 Views Left  08/29/2014   CLINICAL DATA:  Passed out and fell today.  Left hip and knee pain.  EXAM: DG HIP (WITH OR WITHOUT PELVIS) 2-3V LEFT  COMPARISON:  None.  FINDINGS: There is atherosclerotic calcification of the femoral arteries bilaterally. Transitional type anatomy at the lumbosacral junction. No evidence for acute fracture or subluxation.  IMPRESSION: No evidence for acute  abnormality.   Electronically Signed   By: Nolon Nations M.D.   On: 08/29/2014 15:56    CBC  Recent Labs Lab 08/28/14 1506 08/29/14 0546  WBC 11.2* 9.9  HGB 11.2* 10.7*  HCT 34.8* 33.1*  PLT 353 350  MCV 95.9 96.8  MCH 30.9 31.3  MCHC 32.2 32.3  RDW 13.8 14.7  LYMPHSABS 1.7  --   MONOABS 0.6  --   EOSABS 0.1  --   BASOSABS 0.0  --     Chemistries   Recent Labs Lab 08/28/14 1506 08/29/14 0546 08/30/14 0520 08/31/14 0513 09/01/14 0555 09/01/14 0758  NA 144 142 138 136 135 135  K 3.7 3.7 4.3 4.7 5.4* 4.9  CL 107 107 102 103 106 107  CO2 29 31 28 24  21* 24  GLUCOSE 149* 172* 132* 86 158* 147*  BUN 20 21* 31* 42*  43* 45*  CREATININE 0.66 0.83 1.32* 1.78* 1.18* 1.19*  CALCIUM 9.0 8.7* 8.6* 8.3* 7.8* 7.8*  AST  26  --   --   --   --   --   ALT 14  --   --   --   --   --   ALKPHOS 68  --   --   --   --   --   BILITOT 0.4  --   --   --   --   --    ------------------------------------------------------------------------------------------------------------------ estimated creatinine clearance is 50.9 mL/min (by C-G formula based on Cr of 1.19). ------------------------------------------------------------------------------------------------------------------ No results for input(s): HGBA1C in the last 72 hours. ------------------------------------------------------------------------------------------------------------------ No results for input(s): CHOL, HDL, LDLCALC, TRIG, CHOLHDL, LDLDIRECT in the last 72 hours. ------------------------------------------------------------------------------------------------------------------ No results for input(s): TSH, T4TOTAL, T3FREE, THYROIDAB in the last 72 hours.  Invalid input(s): FREET3 ------------------------------------------------------------------------------------------------------------------ No results for input(s): VITAMINB12, FOLATE, FERRITIN, TIBC, IRON, RETICCTPCT in the last 72 hours.  Coagulation profile  Recent Labs Lab 08/28/14 1506  INR 1.10    No results for input(s): DDIMER in the last 72 hours.  Cardiac Enzymes  Recent Labs Lab 08/29/14 0104 08/29/14 0546 08/29/14 1205  TROPONINI 0.60* 0.47* 0.47*   ------------------------------------------------------------------------------------------------------------------ Invalid input(s): POCBNP   Recent Labs  08/31/14 0855 08/31/14 1012 08/31/14 1132 08/31/14 1725 08/31/14 2111 09/01/14 0738  GLUCAP 78 154* 174* 181* 150* 121*     [name] M.D. [facility] 09/01/2014, 10:04 AM  Pager: [phone] Between 7am to 7pm - call Pager - [phone]  After 7pm go to [email or web address] - password [email or web address]  Call night coverage person covering after 7pm

## 2014-09-01 NOTE — Clinical Social Work Note (Signed)
CSW was asked to provide SNF bed options for pt's family as they are touring this weekend.  List was given to family and per RN family wants [facility] for pt's rehab needs  CSW will send recent clinicals to [facility] and let them know they are pt's choice for her rehab needs.  Dede Query, LCSW [place] Worker - Weekend Coverage cell #: [phone]

## 2014-09-02 DIAGNOSIS — E119 Type 2 diabetes mellitus without complications: Secondary | ICD-10-CM | POA: Diagnosis not present

## 2014-09-02 DIAGNOSIS — S82892D Other fracture of left lower leg, subsequent encounter for closed fracture with routine healing: Secondary | ICD-10-CM | POA: Diagnosis not present

## 2014-09-02 DIAGNOSIS — I2510 Atherosclerotic heart disease of native coronary artery without angina pectoris: Secondary | ICD-10-CM | POA: Diagnosis not present

## 2014-09-02 DIAGNOSIS — E785 Hyperlipidemia, unspecified: Secondary | ICD-10-CM | POA: Diagnosis not present

## 2014-09-02 DIAGNOSIS — Z7982 Long term (current) use of aspirin: Secondary | ICD-10-CM | POA: Diagnosis not present

## 2014-09-02 DIAGNOSIS — R531 Weakness: Secondary | ICD-10-CM | POA: Diagnosis not present

## 2014-09-02 DIAGNOSIS — S2239XA Fracture of one rib, unspecified side, initial encounter for closed fracture: Secondary | ICD-10-CM | POA: Diagnosis not present

## 2014-09-02 DIAGNOSIS — Z794 Long term (current) use of insulin: Secondary | ICD-10-CM | POA: Diagnosis not present

## 2014-09-02 DIAGNOSIS — R748 Abnormal levels of other serum enzymes: Secondary | ICD-10-CM | POA: Diagnosis not present

## 2014-09-02 DIAGNOSIS — E039 Hypothyroidism, unspecified: Secondary | ICD-10-CM | POA: Diagnosis not present

## 2014-09-02 DIAGNOSIS — I10 Essential (primary) hypertension: Secondary | ICD-10-CM | POA: Diagnosis not present

## 2014-09-02 DIAGNOSIS — G40909 Epilepsy, unspecified, not intractable, without status epilepticus: Secondary | ICD-10-CM | POA: Diagnosis not present

## 2014-09-02 DIAGNOSIS — F329 Major depressive disorder, single episode, unspecified: Secondary | ICD-10-CM | POA: Diagnosis not present

## 2014-09-02 DIAGNOSIS — Z9181 History of falling: Secondary | ICD-10-CM | POA: Diagnosis not present

## 2014-09-02 DIAGNOSIS — M6281 Muscle weakness (generalized): Secondary | ICD-10-CM | POA: Diagnosis not present

## 2014-09-02 DIAGNOSIS — Z95818 Presence of other cardiac implants and grafts: Secondary | ICD-10-CM | POA: Diagnosis not present

## 2014-09-02 DIAGNOSIS — E1059 Type 1 diabetes mellitus with other circulatory complications: Secondary | ICD-10-CM | POA: Diagnosis not present

## 2014-09-02 LAB — BASIC METABOLIC PANEL
Anion gap: 8 (ref 5–15)
BUN: 38 mg/dL — AB (ref 6–20)
CHLORIDE: 108 mmol/L (ref 101–111)
CO2: 23 mmol/L (ref 22–32)
CREATININE: 0.87 mg/dL (ref 0.44–1.00)
Calcium: 8.6 mg/dL — ABNORMAL LOW (ref 8.9–10.3)
GFR calc Af Amer: >60 mL/min (ref >60)
GFR calc non Af Amer: >60 mL/min (ref >60)
GLUCOSE: 63 mg/dL — AB (ref 65–99)
Potassium: 4.7 mmol/L (ref 3.5–5.1)
SODIUM: 139 mmol/L (ref 135–145)

## 2014-09-02 LAB — GLUCOSE, CAPILLARY
GLUCOSE-CAPILLARY: 63 mg/dL — AB (ref 65–99)
GLUCOSE-CAPILLARY: 113 mg/dL — AB (ref 65–99)
Glucose-Capillary: 96 mg/dL (ref 65–99)

## 2014-09-02 MED ORDER — HYDROXYZINE HCL 10 MG PO TABS: 10.0000 mg | ORAL_TABLET | Freq: Three times a day (TID) | ORAL | Status: DC | PRN

## 2014-09-02 MED ORDER — HYDROCODONE-ACETAMINOPHEN 5-325 MG PO TABS: 1.0000 | ORAL_TABLET | ORAL | Status: DC | PRN

## 2014-09-02 MED ORDER — LEVETIRACETAM 1000 MG PO TABS
1000.0000 mg | ORAL_TABLET | Freq: Two times a day (BID) | ORAL | Status: DC
Start: 2014-09-02 — End: 2016-08-30

## 2014-09-02 MED ORDER — INSULIN NPH (HUMAN) (ISOPHANE) 100 UNIT/ML ~~LOC~~ SUSP: 15.0000 [IU] | Freq: Every day | SUBCUTANEOUS | Status: DC

## 2014-09-02 MED ORDER — ASPIRIN 325 MG PO TBEC: 325.0000 mg | DELAYED_RELEASE_TABLET | Freq: Every day | ORAL | Status: DC

## 2014-09-02 MED ORDER — INSULIN ASPART 100 UNIT/ML ~~LOC~~ SOLN: 0.0000 [IU] | Freq: Three times a day (TID) | SUBCUTANEOUS | Status: DC

## 2014-09-02 MED ORDER — DIPHENHYDRAMINE HCL 25 MG PO CAPS: 25.0000 mg | ORAL_CAPSULE | Freq: Four times a day (QID) | ORAL | Status: DC | PRN

## 2014-09-02 MED ORDER — DOCUSATE SODIUM 100 MG PO CAPS: 100.0000 mg | ORAL_CAPSULE | Freq: Two times a day (BID) | ORAL | Status: DC

## 2014-09-02 MED ORDER — INSULIN NPH (HUMAN) (ISOPHANE) 100 UNIT/ML ~~LOC~~ SUSP: 20.0000 [IU] | Freq: Every day | SUBCUTANEOUS | Status: DC

## 2014-09-02 NOTE — Discharge Summary (Signed)
Physician Discharge Summary   Patient ID: [name] MRN: [id number] DOB/AGE: Aug 08, 1945 69 y.o.  Admit date: 08/28/2014 Discharge date: 09/02/2014  Primary Care Physician:  No PCP Per Patient  Discharge Diagnoses:    . Falls  Left proximal fibular fracture   Acute kidney injury   Elevated troponin  . Essential hypertension . CAD (coronary artery disease) . Hypothyroidism  Diabetes mellitus insulin-dependent  Consults:  Orthopedics, Dr.Olin   Recommendations for Outpatient Follow-up:  Per orthopedics recommendations  Weight bearing as tolerated LLE Follow up prn for radiographic assessment PT for eval and treat   TESTS THAT NEED FOLLOW-UP CBC, BMET   DIET: Carb modified diet    Allergies:   Allergies  Allergen Reactions  . Codeine Itching     Discharge Medications:   Medication List    STOP taking these medications        ALPRAZolam 1 MG tablet  Commonly known as:  XANAX     furosemide 40 MG tablet  Commonly known as:  LASIX     insulin regular 100 units/mL injection  Commonly known as:  NOVOLIN R,HUMULIN R     potassium chloride SA 20 MEQ tablet  Commonly known as:  K-DUR,KLOR-CON     valsartan 160 MG tablet  Commonly known as:  DIOVAN      TAKE these medications        aspirin 325 MG EC tablet  Take 1 tablet (325 mg total) by mouth daily.     CRESTOR 10 MG tablet  Generic drug:  rosuvastatin  Take 10 mg by mouth daily.     diphenhydrAMINE 25 mg capsule  Commonly known as:  BENADRYL  Take 1 capsule (25 mg total) by mouth every 6 (six) hours as needed for itching.     docusate sodium 100 MG capsule  Commonly known as:  COLACE  Take 1 capsule (100 mg total) by mouth 2 (two) times daily.     HYDROcodone-acetaminophen 5-325 MG per tablet  Commonly known as:  NORCO/VICODIN  Take 1 tablet by mouth every 4 (four) hours as needed for moderate pain.     hydrOXYzine 10 MG tablet  Commonly known as:  ATARAX/VISTARIL  Take 1 tablet (10  mg total) by mouth 3 (three) times daily as needed for itching.     insulin aspart 100 UNIT/ML injection  Commonly known as:  novoLOG  Inject 0-9 Units into the skin 3 (three) times daily with meals. Sliding scale CBG 70 - 120: 0 units CBG 121 - 150: 1 unit,  CBG 151 - 200: 2 units,  CBG 201 - 250: 3 units,  CBG 251 - 300: 5 units,  CBG 301 - 350: 7 units,  CBG 351 - 400: 9 units   CBG > 400: 9 units and notify your MD     insulin NPH Human 100 UNIT/ML injection  Commonly known as:  HUMULIN N,NOVOLIN N  Inject 0.2 mLs (20 Units total) into the skin at bedtime.     Insulin Syringe-Needle U-100 31G X 15/64" 0.5 ML Misc  1 Device by Does not apply route 3 (three) times daily with meals.     levETIRAcetam 1000 MG tablet  Commonly known as:  KEPPRA  Take 1 tablet (1,000 mg total) by mouth 2 (two) times daily.     metoprolol 50 MG tablet  Commonly known as:  LOPRESSOR  Take 50 mg by mouth 2 (two) times daily.     nitroGLYCERIN 0.4 MG SL tablet  Commonly known as:  NITROSTAT  Place 0.4 mg under the tongue as needed for chest pain.     prasugrel 10 MG Tabs tablet  Commonly known as:  EFFIENT  Take 10 mg by mouth daily.     rizatriptan 10 MG tablet  Commonly known as:  MAXALT  Take 10 mg by mouth as needed for migraine.     sertraline 50 MG tablet  Commonly known as:  ZOLOFT  Take 100 mg by mouth every morning.     VENTOLIN HFA 108 (90 BASE) MCG/ACT inhaler  Generic drug:  albuterol  Inhale 1-2 puffs into the lungs every 6 (six) hours as needed for wheezing or shortness of breath.         Brief H and P: For complete details please refer to admission H and P, but in brief [name] is a 69 y.o. female with a history of CAD S/P PCI with Stent RCA (2011), IDDM, HTN, Seizure Disorder, and Hyperlipidemia who presented to the [facility] ED with complaints of increased weakness and multiple falls and slurred speech over the last 3-4 days. She reported increased pain in her Left Ankle and  Left knee after her last fall. She denied any LOC and deniedany chest pain. A CT scan of the head was performed and was negative for acute findings, and X-rays of the Right Knee and Left ankle were negative for fracture.  Hospital Course:   Weakness generalized- improving  UA negative for UTI, CT head negative, patient was having frequent falls at home prior to admission with multiple bruising sites. Chest x-ray negative for pneumonia. MRI of the brain was obtained which was negative for any stroke. Vitamin B12, TSH all within normal limits.   Urine toxicology was positive for benzodiazepine, patient was on Xanax as needed at home, currently discontinued. Patient will need extensive physical therapy and rehabilitation.   left knee pain, left proximal fibular fracture - Left ankle and right knee negative for any fracture or dislocation. Left knee Xray showed communited but nodisplaced proximal left fibular fracture. Orthopedics was consulted, patient was seen by Dr. Alvan Dame. Per orthopedics, patient does not need any surgery at this time, recommended physical therapy and weightbearing as tolerated. Patient will follow-up with Dr. Alvan Dame in 2 weeks in office. PT evaluation and treatment at skilled nursing facility for rehabilitation.   Acute kidney injury- resolved Patient's creatinine function trended up to 1.78 during hospitalization due to poor PO intake. Lasix and Diovan were held and patient was gently hydrated. Renal ultrasound showed no hydronephrosis or obstruction. Currently patient is tolerating regular diet without any difficulty, creatinine has normalized 2.87 at the time of discharge.  Mildly elevated troponins-no chest pain  - EKG with no ischemic changes. 2-D echo showed EF of 55-60%, normal wall motion, no regional wall motion abnormalities, grade 2 diastolic dysfunction   Essential hypertension - Currently stable, continue Lopressor   Hypothyroidism TSH - 1.98, not on  Synthroid   CAD (coronary artery disease)- patient had RCA stent in 2011, per office notes, mid LAD narrowing - Continue beta blocker, on prasugrel    Diabetes: Insulin-dependent,  - CBGs currently controlled, continue sliding scale insulin and NPH insulin - hemoglobin A1c 8.3   Falls - PTOT evaluation-> skilled nursing facility   Seizure disorder - Continue Keppra   Morbid obesity  patient counseled on diet and weight control, she will need significant rehabilitation and SNF, patient agreeable.    Day of Discharge BP 134/74 mmHg  Pulse  71  Temp(Src) 98.1 F (36.7 C) (Oral)  Resp 22  Ht 5' (1.524 m)  Wt 112.6 kg (248 lb 3.8 oz)  BMI 48.48 kg/m2  SpO2 97%  Physical Exam: General: Alert and awake oriented x3 not in any acute distress. HEENT: anicteric sclera, pupils reactive to light and accommodation CVS: S1-S2 clear no murmur rubs or gallops Chest: clear to auscultation bilaterally, no wheezing rales or rhonchi Abdomen: Morbidly obese, soft nontender, nondistended, normal bowel sounds Extremities: no cyanosis, clubbing or edema noted bilaterally, left knee ROM dec Neuro: Cranial nerves II-XII intact, no focal neurological deficits   The results of significant diagnostics from this hospitalization (including imaging, microbiology, ancillary and laboratory) are listed below for reference.    LAB RESULTS: Basic Metabolic Panel:  Recent Labs Lab 09/01/14 0758 09/02/14 0444  NA 135 139  K 4.9 4.7  CL 107 108  CO2 24 23  GLUCOSE 147* 63*  BUN 45* 38*  CREATININE 1.19* 0.87  CALCIUM 7.8* 8.6*   Liver Function Tests:  Recent Labs Lab 08/28/14 1506 09/01/14 0708  AST 26 29  ALT 14 15  ALKPHOS 68 58  BILITOT 0.4 0.3  PROT 7.1 6.6  ALBUMIN 3.4* 2.9*   No results for input(s): LIPASE, AMYLASE in the last 168 hours. No results for input(s): AMMONIA in the last 168 hours. CBC:  Recent Labs Lab 08/28/14 1506 08/29/14 0546  WBC 11.2* 9.9   NEUTROABS 8.7*  --   HGB 11.2* 10.7*  HCT 34.8* 33.1*  MCV 95.9 96.8  PLT 353 350   Cardiac Enzymes:  Recent Labs Lab 08/29/14 0546 08/29/14 1205 08/30/14 0520 08/31/14 0513  CKTOTAL 93  --  170 251*  TROPONINI 0.47* 0.47*  --   --    BNP: Invalid input(s): POCBNP CBG:  Recent Labs Lab 09/02/14 0659 09/02/14 0738  GLUCAP 63* 96    Significant Diagnostic Studies:  Dg Chest 1 View  08/28/2014   CLINICAL DATA:  Status post fall, knee pain, ankle pain  EXAM: CHEST  1 VIEW  COMPARISON:  08/04/2014  FINDINGS: There is no focal parenchymal opacity. There is no pleural effusion or pneumothorax. There is stable cardiomegaly. There is thoracic aortic atherosclerosis.  The osseous structures are unremarkable.  IMPRESSION: No active disease.   Electronically Signed   By: Kathreen Devoid   On: 08/28/2014 17:04   Dg Ankle Complete Left  08/28/2014   CLINICAL DATA:  Fall.  Weakness  EXAM: LEFT ANKLE COMPLETE - 3+ VIEW  COMPARISON:  None.  FINDINGS: Mild diffuse soft tissue swelling identified. There is no fracture or subluxation identified. No radio-opaque foreign body or soft tissue calcification. Small plantar heel spur noted.  IMPRESSION: 1. No acute bone abnormality. 2. Soft tissue swelling.   Electronically Signed   By: Kerby Moors M.D.   On: 08/28/2014 17:07   Ct Head Wo Contrast  08/28/2014   CLINICAL DATA:  Weakness with multiple areas of pain. Multiple falls with bruising, swelling and headaches. History of seizure disorder, diabetes and syncope. Initial encounter.  EXAM: CT HEAD WITHOUT CONTRAST  TECHNIQUE: Contiguous axial images were obtained from the base of the skull through the vertex without intravenous contrast.  COMPARISON:  08/04/2014 and 05/28/2010.  FINDINGS: Mild motion artifact. There is no evidence of acute intracranial hemorrhage, mass lesion, brain edema or extra-axial fluid collection. The ventricles and subarachnoid spaces remain appropriately sized for age. There  is stable mild periventricular white matter disease. No evidence of acute infarct.  Probable chronic postsurgical changes in the left mastoid air cells. The visualized paranasal sinuses, mastoid air cells and middle ears are otherwise clear. The calvarium is intact.  IMPRESSION: Stable examination without acute intracranial or calvarial findings. Mild chronic periventricular white matter disease.   Electronically Signed   By: Richardean Sale M.D.   On: 08/28/2014 17:02   Mr Brain Wo Contrast  08/29/2014   CLINICAL DATA:  69 year old female with slurred speech for several days, increased weakness. Current history of seizure disorder. Initial encounter.  EXAM: MRI HEAD WITHOUT CONTRAST  TECHNIQUE: Multiplanar, multiecho pulse sequences of the brain and surrounding structures were obtained without intravenous contrast.  COMPARISON:  Head CTs without contrast 08/28/2014 and earlier.  FINDINGS: Study is intermittently degraded by motion artifact despite repeated imaging attempts.  Cerebral volume is within normal limits for age. No restricted diffusion to suggest acute infarction. No midline shift, mass effect, evidence of mass lesion, ventriculomegaly, extra-axial collection or acute intracranial hemorrhage. Cervicomedullary junction and pituitary are within normal limits. Major intracranial vascular flow voids are preserved, the distal left vertebral artery appears dominant.  Mild to moderate for age nonspecific patchy cerebral white matter T2 and FLAIR hyperintensity. No cortical encephalomalacia or chronic cerebral blood products identified. Possible tiny chronic lacunar infarct in the right thalamus (series 6, image 14). Otherwise normal for age deep gray matter nuclei, brainstem, and cerebellum. Mesial temporal lobe structures appear symmetric.  Mastoids and paranasal sinuses appear clear. Orbit and scalp soft tissues appear within normal limits. Large body habitus.  IMPRESSION: 1.  No acute intracranial  abnormality. 2. Mild to moderate for age signal changes in the brain most commonly due to chronic small vessel disease.   Electronically Signed   By: Genevie Ann M.D.   On: 08/29/2014 19:36   Dg Knee Complete 4 Views Left  08/29/2014   CLINICAL DATA:  69 year old female with syncope and fall today. Left hip and knee pain. Initial encounter.  EXAM: LEFT KNEE - COMPLETE 4+ VIEW  COMPARISON:  None.  FINDINGS: Large body habitus. Advanced tricompartmental degenerative changes at the left knee. Severe medial compartment joint space loss and osteophytosis. Comminuted nondisplaced fracture of the left fibula head/meta diaphysis the tibial plateau appears intact. Patella and distal femur appear intact. Small to moderate suprapatellar joint effusion suspected. Calcified atherosclerosis in the left lower extremity. Dystrophic calcification posterior to the knee joint, but appears within the subcutaneous fat.  IMPRESSION: 1. Comminuted but nondisplaced proximal left fibula fracture. Joint effusion. 2. No other acute fracture or dislocation identified about the left knee. 3. Advanced degenerative changes.   Electronically Signed   By: Genevie Ann M.D.   On: 08/29/2014 15:58   Dg Knee Complete 4 Views Right  08/28/2014   CLINICAL DATA:  [place] multiple times today. Complains of right knee pain.  EXAM: RIGHT KNEE - COMPLETE 4+ VIEW  COMPARISON:  None.  FINDINGS: Medial joint space narrowing with osteophytosis. Extensive degenerative changes at the patellofemoral compartment. No definite joint effusion. Negative for an acute fracture or dislocation. Evidence for vascular calcifications.  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  No acute bone abnormality.   Electronically Signed   By: Markus Daft M.D.   On: 08/28/2014 17:02   Dg Hip Unilat With Pelvis 2-3 Views Left  08/29/2014   CLINICAL DATA:  Passed out and fell today.  Left hip and knee pain.  EXAM: DG HIP (WITH OR WITHOUT PELVIS) 2-3V LEFT  COMPARISON:  None.   FINDINGS: There  is atherosclerotic calcification of the femoral arteries bilaterally. Transitional type anatomy at the lumbosacral junction. No evidence for acute fracture or subluxation.  IMPRESSION: No evidence for acute  abnormality.   Electronically Signed   By: Nolon Nations M.D.   On: 08/29/2014 15:56    2D ECHO: Study Conclusions  - Left ventricle: The cavity size was normal. Wall thickness was increased in a pattern of moderate LVH. Systolic function was normal. The estimated ejection fraction was in the range of 55% to 60%. Wall motion was normal; there were no regional wall motion abnormalities. Features are consistent with a pseudonormal left ventricular filling pattern, with concomitant abnormal relaxation and increased filling pressure (grade 2 diastolic dysfunction). - Aortic valve: Valve mobility was mildly restricted. There was mild stenosis. There was mild regurgitation. Valve area (VTI): 1.51 cm^2. Valve area (Vmax): 1.35 cm^2. Valve area (Vmean): 1.32 cm^2. - Mitral valve: Severely calcified annulus. There was mild regurgitation. - Left atrium: The atrium was moderately dilated. - Right ventricle: The cavity size was mildly dilated. Wall thickness was normal. - Right atrium: The atrium was moderately dilated.  Disposition and Follow-up: Discharge Instructions    Diet Carb Modified    Complete by:  As directed      Discharge instructions    Complete by:  As directed   Weaight bearing as tolerated     Increase activity slowly    Complete by:  As directed             DISPOSITION: SNF   DISCHARGE FOLLOW-UP Follow-up Information    Follow up with Mauri Pole, MD. Schedule an appointment as soon as possible for a visit in 2 weeks.   Specialty:  Orthopedic Surgery   Why:  for hospital follow-up   Contact information:   [address] [place] [address] [id number]        Time spent on Discharge: 35  mins   Signed:   Kru Allman M.D. [facility] 09/02/2014, 11:21 AM Pager: [phone]

## 2014-09-02 NOTE — Progress Notes (Signed)
Report called to [place] at [facility]. Andre Lefort

## 2014-09-02 NOTE — Clinical Social Work Placement (Signed)
Patient is set to discharge to [facility] today. Patient & husband at bedside aware. Discharge packet given to RN, Baxter Flattery. [facility] called for transport to pickup at 1:30pm.     Raynaldo Opitz, [place] Social Worker cell #: [phone]    CLINICAL SOCIAL WORK PLACEMENT  NOTE  Date:  09/02/2014  Patient Details  Name: [name] MRN: [id number] Date of Birth: 08/02/1945  Clinical Social Work is seeking post-discharge placement for this patient at the [place] level of care (*CSW will initial, date and re-position this form in  chart as items are completed):  Yes   Patient/family provided with [facility] Work Department's list of facilities offering this level of care within the geographic area requested by the patient (or if unable, by the patient's family).  Yes   Patient/family informed of their freedom to choose among providers that offer the needed level of care, that participate in Medicare, Medicaid or managed care program needed by the patient, have an available bed and are willing to accept the patient.  Yes   Patient/family informed of [facility]'s ownership interest in [facility] and [facility] as well as of the fact that they are under no obligation to receive care at these facilities.  PASRR submitted to EDS on 08/30/14     PASRR number received on 08/30/14     Existing PASRR number confirmed on       FL2 transmitted to all facilities in geographic area requested by pt/family on 08/30/14     FL2 transmitted to all facilities within larger geographic area on       Patient informed that his/her managed care company has contracts with or will negotiate with certain facilities, including the following:        Yes   Patient/family informed of bed offers received.  Patient chooses bed at [facility] recommends and patient chooses bed at      Patient to be  transferred to [facility] on 09/02/14.  Patient to be transferred to facility by [facility]     Patient family notified on 09/02/14 of transfer.  Name of family member notified:  patient's husband, [name] at bedside     PHYSICIAN       Additional Comment:    _______________________________________________ Standley Brooking, LCSW 09/02/2014, 12:33 PM

## 2014-09-02 NOTE — Care Management Important Message (Signed)
Important Message  Patient Details  Name: [name] MRN: [id number] Date of Birth: 1945/01/16   Medicare Important Message Given:  [facility] notification given    Camillo Flaming 09/02/2014, 11:12 AMImportant Message  Patient Details  Name: [name] MRN: [id number] Date of Birth: 04-23-1945   Medicare Important Message Given:  Yes-second notification given    Camillo Flaming 09/02/2014, 11:12 AM

## 2014-09-02 NOTE — Plan of Care (Signed)
Problem: Phase III Progression Outcomes Goal: Foley discontinued Outcome: Not Met (add Reason) Instructed to leave foley in at discharge to SNF

## 2014-09-03 DIAGNOSIS — Z9181 History of falling: Secondary | ICD-10-CM | POA: Diagnosis not present

## 2014-09-03 DIAGNOSIS — I2510 Atherosclerotic heart disease of native coronary artery without angina pectoris: Secondary | ICD-10-CM | POA: Diagnosis not present

## 2014-09-03 DIAGNOSIS — R2689 Other abnormalities of gait and mobility: Secondary | ICD-10-CM | POA: Diagnosis not present

## 2014-09-03 DIAGNOSIS — E039 Hypothyroidism, unspecified: Secondary | ICD-10-CM | POA: Diagnosis not present

## 2014-09-03 DIAGNOSIS — M6281 Muscle weakness (generalized): Secondary | ICD-10-CM | POA: Diagnosis not present

## 2014-09-03 DIAGNOSIS — E109 Type 1 diabetes mellitus without complications: Secondary | ICD-10-CM | POA: Diagnosis not present

## 2014-09-03 DIAGNOSIS — S82892D Other fracture of left lower leg, subsequent encounter for closed fracture with routine healing: Secondary | ICD-10-CM | POA: Diagnosis not present

## 2014-09-03 DIAGNOSIS — S82402A Unspecified fracture of shaft of left fibula, initial encounter for closed fracture: Secondary | ICD-10-CM | POA: Diagnosis not present

## 2014-09-03 DIAGNOSIS — F419 Anxiety disorder, unspecified: Secondary | ICD-10-CM | POA: Diagnosis not present

## 2014-09-03 DIAGNOSIS — S82302A Unspecified fracture of lower end of left tibia, initial encounter for closed fracture: Secondary | ICD-10-CM | POA: Diagnosis not present

## 2014-09-03 DIAGNOSIS — S82492D Other fracture of shaft of left fibula, subsequent encounter for closed fracture with routine healing: Secondary | ICD-10-CM | POA: Diagnosis not present

## 2014-09-03 DIAGNOSIS — E139 Other specified diabetes mellitus without complications: Secondary | ICD-10-CM | POA: Diagnosis not present

## 2014-09-03 DIAGNOSIS — G4089 Other seizures: Secondary | ICD-10-CM | POA: Diagnosis not present

## 2014-09-03 DIAGNOSIS — I10 Essential (primary) hypertension: Secondary | ICD-10-CM | POA: Diagnosis not present

## 2014-09-03 DIAGNOSIS — E119 Type 2 diabetes mellitus without complications: Secondary | ICD-10-CM | POA: Diagnosis not present

## 2014-09-03 DIAGNOSIS — S82102D Unspecified fracture of upper end of left tibia, subsequent encounter for closed fracture with routine healing: Secondary | ICD-10-CM | POA: Diagnosis not present

## 2014-09-03 DIAGNOSIS — Z4789 Encounter for other orthopedic aftercare: Secondary | ICD-10-CM | POA: Diagnosis not present

## 2014-09-03 DIAGNOSIS — G809 Cerebral palsy, unspecified: Secondary | ICD-10-CM | POA: Diagnosis not present

## 2014-09-03 DIAGNOSIS — G40909 Epilepsy, unspecified, not intractable, without status epilepticus: Secondary | ICD-10-CM | POA: Diagnosis not present

## 2014-09-03 DIAGNOSIS — F322 Major depressive disorder, single episode, severe without psychotic features: Secondary | ICD-10-CM | POA: Diagnosis not present

## 2014-09-03 DIAGNOSIS — N179 Acute kidney failure, unspecified: Secondary | ICD-10-CM | POA: Diagnosis not present

## 2014-09-03 LAB — VITAMIN D 1,25 DIHYDROXY
Vitamin D 1, 25 (OH)2 Total: 24 pg/mL
Vitamin D2 1, 25 (OH)2: <10 pg/mL
Vitamin D3 1, 25 (OH)2: 24 pg/mL

## 2014-09-06 DIAGNOSIS — E039 Hypothyroidism, unspecified: Secondary | ICD-10-CM | POA: Diagnosis not present

## 2014-09-06 DIAGNOSIS — G40909 Epilepsy, unspecified, not intractable, without status epilepticus: Secondary | ICD-10-CM | POA: Diagnosis not present

## 2014-09-06 DIAGNOSIS — N179 Acute kidney failure, unspecified: Secondary | ICD-10-CM | POA: Diagnosis not present

## 2014-09-06 DIAGNOSIS — I10 Essential (primary) hypertension: Secondary | ICD-10-CM | POA: Diagnosis not present

## 2014-09-06 DIAGNOSIS — E139 Other specified diabetes mellitus without complications: Secondary | ICD-10-CM | POA: Diagnosis not present

## 2014-09-06 DIAGNOSIS — S82402A Unspecified fracture of shaft of left fibula, initial encounter for closed fracture: Secondary | ICD-10-CM | POA: Diagnosis not present

## 2014-09-06 DIAGNOSIS — F322 Major depressive disorder, single episode, severe without psychotic features: Secondary | ICD-10-CM | POA: Diagnosis not present

## 2014-09-11 DIAGNOSIS — Z4789 Encounter for other orthopedic aftercare: Secondary | ICD-10-CM | POA: Diagnosis not present

## 2014-09-11 DIAGNOSIS — S82102D Unspecified fracture of upper end of left tibia, subsequent encounter for closed fracture with routine healing: Secondary | ICD-10-CM | POA: Diagnosis not present

## 2014-09-12 DIAGNOSIS — F419 Anxiety disorder, unspecified: Secondary | ICD-10-CM | POA: Diagnosis not present

## 2014-09-12 DIAGNOSIS — G809 Cerebral palsy, unspecified: Secondary | ICD-10-CM | POA: Diagnosis not present

## 2014-09-12 DIAGNOSIS — I2510 Atherosclerotic heart disease of native coronary artery without angina pectoris: Secondary | ICD-10-CM | POA: Diagnosis not present

## 2014-09-19 DIAGNOSIS — E109 Type 1 diabetes mellitus without complications: Secondary | ICD-10-CM | POA: Diagnosis not present

## 2014-09-19 DIAGNOSIS — I10 Essential (primary) hypertension: Secondary | ICD-10-CM | POA: Diagnosis not present

## 2014-09-19 DIAGNOSIS — I2510 Atherosclerotic heart disease of native coronary artery without angina pectoris: Secondary | ICD-10-CM | POA: Diagnosis not present

## 2014-09-23 DIAGNOSIS — E119 Type 2 diabetes mellitus without complications: Secondary | ICD-10-CM | POA: Diagnosis not present

## 2014-09-23 DIAGNOSIS — S82492D Other fracture of shaft of left fibula, subsequent encounter for closed fracture with routine healing: Secondary | ICD-10-CM | POA: Diagnosis not present

## 2014-09-27 DIAGNOSIS — E119 Type 2 diabetes mellitus without complications: Secondary | ICD-10-CM | POA: Diagnosis not present

## 2014-09-27 DIAGNOSIS — I2510 Atherosclerotic heart disease of native coronary artery without angina pectoris: Secondary | ICD-10-CM | POA: Diagnosis not present

## 2014-09-27 DIAGNOSIS — I10 Essential (primary) hypertension: Secondary | ICD-10-CM | POA: Diagnosis not present

## 2014-09-27 DIAGNOSIS — G40909 Epilepsy, unspecified, not intractable, without status epilepticus: Secondary | ICD-10-CM | POA: Diagnosis not present

## 2014-09-27 DIAGNOSIS — G43909 Migraine, unspecified, not intractable, without status migrainosus: Secondary | ICD-10-CM | POA: Diagnosis not present

## 2014-09-27 DIAGNOSIS — F419 Anxiety disorder, unspecified: Secondary | ICD-10-CM | POA: Diagnosis not present

## 2014-10-01 DIAGNOSIS — E119 Type 2 diabetes mellitus without complications: Secondary | ICD-10-CM | POA: Diagnosis not present

## 2014-10-01 DIAGNOSIS — S82492D Other fracture of shaft of left fibula, subsequent encounter for closed fracture with routine healing: Secondary | ICD-10-CM | POA: Diagnosis not present

## 2014-10-02 ENCOUNTER — Ambulatory Visit: Payer: Medicare Other | Admitting: Nurse Practitioner

## 2014-10-03 DIAGNOSIS — S82492D Other fracture of shaft of left fibula, subsequent encounter for closed fracture with routine healing: Secondary | ICD-10-CM | POA: Diagnosis not present

## 2014-10-03 DIAGNOSIS — E119 Type 2 diabetes mellitus without complications: Secondary | ICD-10-CM | POA: Diagnosis not present

## 2014-10-15 DIAGNOSIS — E119 Type 2 diabetes mellitus without complications: Secondary | ICD-10-CM | POA: Diagnosis not present

## 2014-10-15 DIAGNOSIS — S82492D Other fracture of shaft of left fibula, subsequent encounter for closed fracture with routine healing: Secondary | ICD-10-CM | POA: Diagnosis not present

## 2014-10-17 DIAGNOSIS — S82492D Other fracture of shaft of left fibula, subsequent encounter for closed fracture with routine healing: Secondary | ICD-10-CM | POA: Diagnosis not present

## 2014-10-17 DIAGNOSIS — E119 Type 2 diabetes mellitus without complications: Secondary | ICD-10-CM | POA: Diagnosis not present

## 2014-10-21 DIAGNOSIS — E119 Type 2 diabetes mellitus without complications: Secondary | ICD-10-CM | POA: Diagnosis not present

## 2014-10-21 DIAGNOSIS — S82492D Other fracture of shaft of left fibula, subsequent encounter for closed fracture with routine healing: Secondary | ICD-10-CM | POA: Diagnosis not present

## 2014-10-28 ENCOUNTER — Ambulatory Visit: Payer: Medicare Other | Admitting: Nurse Practitioner

## 2014-10-31 DIAGNOSIS — E119 Type 2 diabetes mellitus without complications: Secondary | ICD-10-CM | POA: Diagnosis not present

## 2014-10-31 DIAGNOSIS — S82492D Other fracture of shaft of left fibula, subsequent encounter for closed fracture with routine healing: Secondary | ICD-10-CM | POA: Diagnosis not present

## 2014-11-03 DIAGNOSIS — F5101 Primary insomnia: Secondary | ICD-10-CM | POA: Insufficient documentation

## 2014-11-03 DIAGNOSIS — M130 Polyarthritis, unspecified: Secondary | ICD-10-CM | POA: Diagnosis not present

## 2014-11-03 DIAGNOSIS — G4700 Insomnia, unspecified: Secondary | ICD-10-CM | POA: Diagnosis not present

## 2014-11-03 DIAGNOSIS — Z23 Encounter for immunization: Secondary | ICD-10-CM | POA: Diagnosis not present

## 2014-11-04 DIAGNOSIS — M130 Polyarthritis, unspecified: Secondary | ICD-10-CM | POA: Diagnosis not present

## 2015-01-03 DIAGNOSIS — F419 Anxiety disorder, unspecified: Secondary | ICD-10-CM | POA: Diagnosis not present

## 2015-01-03 DIAGNOSIS — E119 Type 2 diabetes mellitus without complications: Secondary | ICD-10-CM | POA: Diagnosis not present

## 2015-01-03 DIAGNOSIS — I10 Essential (primary) hypertension: Secondary | ICD-10-CM | POA: Diagnosis not present

## 2015-01-03 DIAGNOSIS — R109 Unspecified abdominal pain: Secondary | ICD-10-CM | POA: Diagnosis not present

## 2015-01-03 DIAGNOSIS — I2510 Atherosclerotic heart disease of native coronary artery without angina pectoris: Secondary | ICD-10-CM | POA: Diagnosis not present

## 2015-01-03 DIAGNOSIS — G4700 Insomnia, unspecified: Secondary | ICD-10-CM | POA: Diagnosis not present

## 2015-01-16 DIAGNOSIS — R1011 Right upper quadrant pain: Secondary | ICD-10-CM | POA: Diagnosis not present

## 2015-01-16 DIAGNOSIS — R197 Diarrhea, unspecified: Secondary | ICD-10-CM | POA: Diagnosis not present

## 2015-01-16 DIAGNOSIS — R1110 Vomiting, unspecified: Secondary | ICD-10-CM | POA: Diagnosis not present

## 2015-01-16 DIAGNOSIS — R1031 Right lower quadrant pain: Secondary | ICD-10-CM | POA: Diagnosis not present

## 2015-07-14 DIAGNOSIS — F419 Anxiety disorder, unspecified: Secondary | ICD-10-CM | POA: Diagnosis not present

## 2015-07-14 DIAGNOSIS — E119 Type 2 diabetes mellitus without complications: Secondary | ICD-10-CM | POA: Diagnosis not present

## 2015-07-14 DIAGNOSIS — E1140 Type 2 diabetes mellitus with diabetic neuropathy, unspecified: Secondary | ICD-10-CM | POA: Diagnosis not present

## 2015-07-14 DIAGNOSIS — G4700 Insomnia, unspecified: Secondary | ICD-10-CM | POA: Diagnosis not present

## 2015-07-14 DIAGNOSIS — I10 Essential (primary) hypertension: Secondary | ICD-10-CM | POA: Diagnosis not present

## 2015-07-14 DIAGNOSIS — G40909 Epilepsy, unspecified, not intractable, without status epilepticus: Secondary | ICD-10-CM | POA: Diagnosis not present

## 2015-08-22 DIAGNOSIS — E119 Type 2 diabetes mellitus without complications: Secondary | ICD-10-CM | POA: Diagnosis not present

## 2015-08-22 DIAGNOSIS — I951 Orthostatic hypotension: Secondary | ICD-10-CM | POA: Diagnosis not present

## 2015-10-02 DIAGNOSIS — R079 Chest pain, unspecified: Secondary | ICD-10-CM | POA: Diagnosis not present

## 2015-10-02 DIAGNOSIS — I10 Essential (primary) hypertension: Secondary | ICD-10-CM | POA: Diagnosis not present

## 2015-10-02 DIAGNOSIS — I252 Old myocardial infarction: Secondary | ICD-10-CM | POA: Diagnosis not present

## 2015-10-02 DIAGNOSIS — Z23 Encounter for immunization: Secondary | ICD-10-CM | POA: Diagnosis not present

## 2015-10-02 DIAGNOSIS — E119 Type 2 diabetes mellitus without complications: Secondary | ICD-10-CM | POA: Diagnosis not present

## 2015-10-02 DIAGNOSIS — R0609 Other forms of dyspnea: Secondary | ICD-10-CM | POA: Diagnosis not present

## 2015-10-02 DIAGNOSIS — E785 Hyperlipidemia, unspecified: Secondary | ICD-10-CM

## 2015-10-02 DIAGNOSIS — I272 Other secondary pulmonary hypertension: Secondary | ICD-10-CM | POA: Diagnosis not present

## 2015-10-02 DIAGNOSIS — I340 Nonrheumatic mitral (valve) insufficiency: Secondary | ICD-10-CM | POA: Diagnosis not present

## 2015-10-02 DIAGNOSIS — I350 Nonrheumatic aortic (valve) stenosis: Secondary | ICD-10-CM | POA: Insufficient documentation

## 2015-10-02 DIAGNOSIS — F411 Generalized anxiety disorder: Secondary | ICD-10-CM | POA: Insufficient documentation

## 2015-10-02 DIAGNOSIS — I25118 Atherosclerotic heart disease of native coronary artery with other forms of angina pectoris: Secondary | ICD-10-CM | POA: Diagnosis not present

## 2015-10-02 DIAGNOSIS — I080 Rheumatic disorders of both mitral and aortic valves: Secondary | ICD-10-CM | POA: Diagnosis present

## 2015-10-02 DIAGNOSIS — R000 Tachycardia, unspecified: Secondary | ICD-10-CM | POA: Diagnosis not present

## 2015-10-02 DIAGNOSIS — I25119 Atherosclerotic heart disease of native coronary artery with unspecified angina pectoris: Secondary | ICD-10-CM | POA: Diagnosis present

## 2015-10-02 DIAGNOSIS — I119 Hypertensive heart disease without heart failure: Secondary | ICD-10-CM | POA: Diagnosis not present

## 2015-10-02 DIAGNOSIS — R0789 Other chest pain: Secondary | ICD-10-CM | POA: Diagnosis not present

## 2015-10-02 DIAGNOSIS — F419 Anxiety disorder, unspecified: Secondary | ICD-10-CM | POA: Diagnosis present

## 2015-10-02 DIAGNOSIS — I25110 Atherosclerotic heart disease of native coronary artery with unstable angina pectoris: Secondary | ICD-10-CM | POA: Diagnosis not present

## 2015-10-02 DIAGNOSIS — Z6841 Body Mass Index (BMI) 40.0 and over, adult: Secondary | ICD-10-CM | POA: Diagnosis not present

## 2015-10-02 DIAGNOSIS — E669 Obesity, unspecified: Secondary | ICD-10-CM | POA: Diagnosis not present

## 2015-10-02 DIAGNOSIS — I2510 Atherosclerotic heart disease of native coronary artery without angina pectoris: Secondary | ICD-10-CM | POA: Diagnosis not present

## 2015-10-02 DIAGNOSIS — Z955 Presence of coronary angioplasty implant and graft: Secondary | ICD-10-CM | POA: Diagnosis not present

## 2015-10-02 DIAGNOSIS — I517 Cardiomegaly: Secondary | ICD-10-CM | POA: Diagnosis not present

## 2015-10-02 DIAGNOSIS — E1169 Type 2 diabetes mellitus with other specified complication: Secondary | ICD-10-CM | POA: Insufficient documentation

## 2015-10-08 DIAGNOSIS — Z6841 Body Mass Index (BMI) 40.0 and over, adult: Secondary | ICD-10-CM | POA: Diagnosis not present

## 2015-10-08 DIAGNOSIS — R0609 Other forms of dyspnea: Secondary | ICD-10-CM | POA: Diagnosis not present

## 2015-10-08 DIAGNOSIS — Z01810 Encounter for preprocedural cardiovascular examination: Secondary | ICD-10-CM | POA: Diagnosis not present

## 2015-10-08 DIAGNOSIS — I2510 Atherosclerotic heart disease of native coronary artery without angina pectoris: Secondary | ICD-10-CM | POA: Diagnosis not present

## 2015-10-08 DIAGNOSIS — I351 Nonrheumatic aortic (valve) insufficiency: Secondary | ICD-10-CM | POA: Diagnosis not present

## 2015-10-14 DIAGNOSIS — I350 Nonrheumatic aortic (valve) stenosis: Secondary | ICD-10-CM | POA: Diagnosis not present

## 2015-10-14 DIAGNOSIS — I252 Old myocardial infarction: Secondary | ICD-10-CM | POA: Diagnosis not present

## 2015-10-14 DIAGNOSIS — Z6841 Body Mass Index (BMI) 40.0 and over, adult: Secondary | ICD-10-CM | POA: Diagnosis not present

## 2015-10-14 DIAGNOSIS — E785 Hyperlipidemia, unspecified: Secondary | ICD-10-CM | POA: Diagnosis not present

## 2015-10-14 DIAGNOSIS — I10 Essential (primary) hypertension: Secondary | ICD-10-CM | POA: Diagnosis not present

## 2015-10-14 DIAGNOSIS — I25118 Atherosclerotic heart disease of native coronary artery with other forms of angina pectoris: Secondary | ICD-10-CM | POA: Diagnosis not present

## 2015-10-14 DIAGNOSIS — E119 Type 2 diabetes mellitus without complications: Secondary | ICD-10-CM | POA: Diagnosis not present

## 2016-04-22 DIAGNOSIS — R079 Chest pain, unspecified: Secondary | ICD-10-CM | POA: Diagnosis not present

## 2016-05-07 DIAGNOSIS — E119 Type 2 diabetes mellitus without complications: Secondary | ICD-10-CM | POA: Diagnosis not present

## 2016-05-07 DIAGNOSIS — I2510 Atherosclerotic heart disease of native coronary artery without angina pectoris: Secondary | ICD-10-CM | POA: Diagnosis not present

## 2016-05-07 DIAGNOSIS — I080 Rheumatic disorders of both mitral and aortic valves: Secondary | ICD-10-CM | POA: Diagnosis not present

## 2016-05-07 DIAGNOSIS — R0600 Dyspnea, unspecified: Secondary | ICD-10-CM | POA: Diagnosis not present

## 2016-05-07 DIAGNOSIS — F419 Anxiety disorder, unspecified: Secondary | ICD-10-CM | POA: Diagnosis not present

## 2016-06-03 DIAGNOSIS — R0602 Shortness of breath: Secondary | ICD-10-CM | POA: Diagnosis not present

## 2016-06-03 DIAGNOSIS — D649 Anemia, unspecified: Secondary | ICD-10-CM | POA: Diagnosis not present

## 2016-06-23 DIAGNOSIS — R069 Unspecified abnormalities of breathing: Secondary | ICD-10-CM | POA: Diagnosis not present

## 2016-07-15 ENCOUNTER — Ambulatory Visit (INDEPENDENT_AMBULATORY_CARE_PROVIDER_SITE_OTHER): Payer: Medicare Other | Admitting: Endocrinology

## 2016-07-15 ENCOUNTER — Encounter: Payer: Self-pay | Admitting: Endocrinology

## 2016-07-15 VITALS — Ht 60.0 in

## 2016-07-15 DIAGNOSIS — E1059 Type 1 diabetes mellitus with other circulatory complications: Secondary | ICD-10-CM

## 2016-07-15 LAB — POCT GLYCOSYLATED HEMOGLOBIN (HGB A1C): HEMOGLOBIN A1C: 8.1

## 2016-07-15 NOTE — Progress Notes (Signed)
Subjective:    Patient ID: [name], female    DOB: 29-Sep-1945, 71 y.o.   MRN: [id number]  HPI Pt returns for f/u of diabetes mellitus: DM type: Insulin-requiring type 2.   Dx'ed: 4034 Complications: polyneuropathy, autonomic neuropathy, CAD, and TIA Therapy: insulin since 2010 GDM: never DKA: never Severe hypoglycemia: never Pancreatitis: never Pancreatic imaging:  Other: she takes multiple daily injections.   Interval history: she averages approx 12 total units per day.  She has frequent n/v (happens most days).  She has mild hypoglycemia when she vomits a meal.   Past Medical History:  Diagnosis Date  . Anxiety   . Coronary artery disease   . Depression   . Diabetes mellitus    insulin dependent  . Edema of lower extremity   . Esophageal stricture 2008   EGD  . Fatigue   . GERD (gastroesophageal reflux disease)   . Heart murmur   . Hiatal hernia 2008   EGD  . Hyperlipidemia   . Hypertension   . Knee pain   . Morbid obesity ([place])   . Nonproductive cough    chronic  . Orthopnea   . Seizure disorder ([place])   . SOB (shortness of breath)   . Syncope and collapse   . Tremor     Past Surgical History:  Procedure Laterality Date  . ABDOMINAL HYSTERECTOMY    . CARDIAC CATHETERIZATION  03/28/2009  . KNEE ARTHROSCOPY    . TRANSTHORACIC ECHOCARDIOGRAM     showed ef of 65% with no regional wall motion abnormalities    Social History   Social History  . Marital status: Married    Spouse name: N/A  . Number of children: 2  . Years of education: 12   Occupational History  . Retired    Social History Main Topics  . Smoking status: Former Smoker    Quit date: 06/02/1970  . Smokeless tobacco: Never Used  . Alcohol use No  . Drug use: No  . Sexual activity: Not on file   Other Topics Concern  . Not on file   Social History Narrative   Lives at home with her husband.   Left-handed.   No caffeine use.    Current Outpatient Prescriptions on File Prior  to Visit  Medication Sig Dispense Refill  . aspirin EC 325 MG EC tablet Take 1 tablet (325 mg total) by mouth daily. 30 tablet 0  . CRESTOR 10 MG tablet Take 10 mg by mouth daily.   0  . diphenhydrAMINE (BENADRYL) 25 mg capsule Take 1 capsule (25 mg total) by mouth every 6 (six) hours as needed for itching. 30 capsule 0  . HYDROcodone-acetaminophen (NORCO/VICODIN) 5-325 MG per tablet Take 1 tablet by mouth every 4 (four) hours as needed for moderate pain. 30 tablet 0  . hydrOXYzine (ATARAX/VISTARIL) 10 MG tablet Take 1 tablet (10 mg total) by mouth 3 (three) times daily as needed for itching. 30 tablet 0  . insulin aspart (NOVOLOG) 100 UNIT/ML injection Inject 0-9 Units into the skin 3 (three) times daily with meals. Sliding scale CBG 70 - 120: 0 units CBG 121 - 150: 1 unit,  CBG 151 - 200: 2 units,  CBG 201 - 250: 3 units,  CBG 251 - 300: 5 units,  CBG 301 - 350: 7 units,  CBG 351 - 400: 9 units   CBG > 400: 9 units and notify your MD 10 mL 11  . Insulin Syringe-Needle U-100 31G X 15/64"  0.5 ML MISC 1 Device by Does not apply route 3 (three) times daily with meals. 100 each 11  . levETIRAcetam (KEPPRA) 1000 MG tablet Take 1 tablet (1,000 mg total) by mouth 2 (two) times daily. 30 tablet 0  . metoprolol (LOPRESSOR) 50 MG tablet Take 50 mg by mouth 2 (two) times daily.   0  . prasugrel (EFFIENT) 10 MG TABS tablet Take 10 mg by mouth daily.    . sertraline (ZOLOFT) 50 MG tablet Take 100 mg by mouth every morning.   0   No current facility-administered medications on file prior to visit.     Allergies  Allergen Reactions  . Codeine Itching    Family History  Problem Relation Age of Onset  . Heart attack Mother   . Hypertension Mother   . COPD Father   . Heart disease Sister   . Diabetes Brother   . Diabetes Brother   . Prostate cancer Brother   . Kidney disease Brother     Ht 5' (1.524 m)    Review of Systems No weight change.     Objective:   Physical Exam VITAL SIGNS:  See vs  page GENERAL: no distress.  In wheelchair.  Pulses: dorsalis pedis intact bilat.   MSK: no deformity of the feet CV: no leg edema Skin:  no ulcer on the feet.  normal color and temp on the feet. Neuro: sensation is intact to touch on the feet, but decreased from normal.  Lab Results  Component Value Date   CREATININE 0.87 09/02/2014   BUN 38 (H) 09/02/2014   NA 139 09/02/2014   K 4.7 09/02/2014   CL 108 09/02/2014   CO2 23 09/02/2014    A1c=8.1%    Assessment & Plan:  Vomiting, new to me, poss due to gastroparesis.   Insulin-requiring type 2 DM, with CAD: We have to control n/v before we can optimize DM rx.    Patient Instructions  check your blood sugar twice a day.  vary the time of day when you check, between before the 3 meals, and at bedtime.  also check if you have symptoms of your blood sugar being too high or too low.  please keep a record of the readings and bring it to your next appointment here (or you can bring the meter itself).  You can write it on any piece of paper.  please call us sooner if your blood sugar goes below 70, or if you have a lot of readings over 200. Please continue the same insulin for now.  Our goal will be to keep the blood sugar in a good range, without insulin, but first, we need to get your nausea under control.  please call [phone] ([place] physician referral line), to get an appointment with a new primary provider, or you could ask Dr Wyline Copas.  Please come back for a follow-up appointment in 2 months.

## 2016-07-15 NOTE — Patient Instructions (Addendum)
check your blood sugar twice a day.  vary the time of day when you check, between before the 3 meals, and at bedtime.  also check if you have symptoms of your blood sugar being too high or too low.  please keep a record of the readings and bring it to your next appointment here (or you can bring the meter itself).  You can write it on any piece of paper.  please call us sooner if your blood sugar goes below 70, or if you have a lot of readings over 200. Please continue the same insulin for now.  Our goal will be to keep the blood sugar in a good range, without insulin, but first, we need to get your nausea under control.  please call [phone] ([place] physician referral line), to get an appointment with a new primary provider, or you could ask Dr Wyline Copas.  Please come back for a follow-up appointment in 2 months.

## 2016-07-29 ENCOUNTER — Ambulatory Visit: Payer: Medicare Other | Admitting: Family Medicine

## 2016-08-16 ENCOUNTER — Encounter: Payer: Self-pay | Admitting: Family Medicine

## 2016-08-16 ENCOUNTER — Ambulatory Visit (INDEPENDENT_AMBULATORY_CARE_PROVIDER_SITE_OTHER): Payer: Medicare Other | Admitting: Family Medicine

## 2016-08-16 VITALS — BP 102/58 | HR 64 | Temp 98.2°F | Ht 60.0 in | Wt 239.8 lb

## 2016-08-16 DIAGNOSIS — E1059 Type 1 diabetes mellitus with other circulatory complications: Secondary | ICD-10-CM | POA: Diagnosis not present

## 2016-08-16 DIAGNOSIS — K760 Fatty (change of) liver, not elsewhere classified: Secondary | ICD-10-CM | POA: Diagnosis not present

## 2016-08-16 DIAGNOSIS — F341 Dysthymic disorder: Secondary | ICD-10-CM | POA: Diagnosis not present

## 2016-08-16 DIAGNOSIS — R42 Dizziness and giddiness: Secondary | ICD-10-CM

## 2016-08-16 DIAGNOSIS — R5383 Other fatigue: Secondary | ICD-10-CM

## 2016-08-16 DIAGNOSIS — K449 Diaphragmatic hernia without obstruction or gangrene: Secondary | ICD-10-CM | POA: Diagnosis not present

## 2016-08-16 DIAGNOSIS — I10 Essential (primary) hypertension: Secondary | ICD-10-CM | POA: Diagnosis not present

## 2016-08-16 DIAGNOSIS — Z8673 Personal history of transient ischemic attack (TIA), and cerebral infarction without residual deficits: Secondary | ICD-10-CM

## 2016-08-16 DIAGNOSIS — E039 Hypothyroidism, unspecified: Secondary | ICD-10-CM | POA: Diagnosis not present

## 2016-08-16 DIAGNOSIS — E785 Hyperlipidemia, unspecified: Secondary | ICD-10-CM

## 2016-08-16 DIAGNOSIS — K222 Esophageal obstruction: Secondary | ICD-10-CM | POA: Diagnosis not present

## 2016-08-16 DIAGNOSIS — R0602 Shortness of breath: Secondary | ICD-10-CM | POA: Diagnosis not present

## 2016-08-16 DIAGNOSIS — K5730 Diverticulosis of large intestine without perforation or abscess without bleeding: Secondary | ICD-10-CM

## 2016-08-16 LAB — GLUCOSE, POCT (MANUAL RESULT ENTRY): POC Glucose: 252 mg/dl — AB (ref 70–99)

## 2016-08-16 MED ORDER — LORAZEPAM 0.5 MG PO TABS: 0.5000 mg | ORAL_TABLET | Freq: Two times a day (BID) | ORAL | 0 refills | Status: DC

## 2016-08-16 NOTE — Progress Notes (Signed)
Walked patient with gait belt, followed by wheelchair to monitor O2 during ambulation. Patient walked roughly 25 feet before starting to get dizzy and short of breath. During ambulation, patient's O2 saturation dropped to 88%-89% on room air at time of symptom onset.   Patient was assisted to wheelchair and was able to rebound quickly. O2 saturation increased back to 95%. Patient ambulated back to room with assistance. O2 dropped again to 89%. During ambulation, patient appeared pallor and clammy towards the latter end of assessment. Upon leaving the room, patient was back in wheelchair, O2 saturation back in the upper 90's and verbally confirmed to feel stable again. --- Dr. Juleen China made aware of assessment.

## 2016-08-16 NOTE — Progress Notes (Signed)
[name] is a 71 y.o. female is here to [place].   Patient Care Team: Briscoe Deutscher, DO as PCP - General (Family Medicine) Everardo Beals, NP as Nurse Practitioner   History of Present Illness:   Shaune Pascal CMA acting as scribe for Dr. Juleen China.  HPI: Patient comes in today to establish care. She has several concerns today. Today when getting weight patient got off of the scale and started to feel dizzy. She had to sit in the chair at the scale and I got her a wheelchair to bring her back to the exam room.   Shortness of Breath: Patient states that she has shortness of breath with walking. Ambulation oxygen today was raging from 88%-89%. Patient stated that she was feeling dizzy this AM. No history of Asthma or never been a smoker.   Heart Disease: Patient sees [facility] Dr Wyline Copas. She had a heart Cath sometime in October 2017. 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 Will order a home sleep study.   Diabetes: Patient sees endocrinology for her diabetes. Last A1C 8.1. She seems to be controlled. We will continue to let endocrinology follow. Patient has a sliding scale of Novalog.   Health Maintenance Due  Topic Date Due  . Hepatitis C Screening  1945-01-30  . OPHTHALMOLOGY EXAM  04/20/1955  . URINE MICROALBUMIN  04/20/1955  . TETANUS/TDAP  04/19/1964  . MAMMOGRAM  04/20/1995  . DEXA SCAN  04/20/2010  . PNA vac Low Risk Adult (1 of 2 - PCV13) 04/20/2010  . COLONOSCOPY  04/26/2016  . INFLUENZA VACCINE  08/04/2016   PMHx, SurgHx, SocialHx, Medications, and Allergies were reviewed in the Visit Navigator and updated as appropriate.   Past Medical History:  Diagnosis Date  . Anxiety   . Coronary artery disease   . Depression   . Diabetes mellitus    insulin dependent  . Edema of lower extremity   . Esophageal stricture 2008   EGD  . Fatigue   . GERD (gastroesophageal reflux disease)   . Heart murmur   . Hiatal hernia 2008   EGD   . Hyperlipidemia   . Hypertension   . Knee pain   . Morbid obesity ([place])   . Nonproductive cough    chronic  . Orthopnea   . Seizure disorder ([place])   . SOB (shortness of breath)   . Syncope and collapse   . Tremor    Past Surgical History:  Procedure Laterality Date  . ABDOMINAL HYSTERECTOMY    . CARDIAC CATHETERIZATION  03/28/2009  . KNEE ARTHROSCOPY    . TRANSTHORACIC ECHOCARDIOGRAM     showed ef of 65% with no regional wall motion abnormalities   Family History  Problem Relation Age of Onset  . Heart attack Mother   . Hypertension Mother   . COPD Father   . Heart disease Sister   . Diabetes Brother   . Diabetes Brother   . Prostate cancer Brother   . Kidney disease Brother    Social History  Substance Use Topics  . Smoking status: Former Smoker    Quit date: 06/02/1970  . Smokeless tobacco: Never Used  . Alcohol use No   Current Medications and Allergies:   .  albuterol (PROVENTIL HFA;VENTOLIN HFA) 108 (90 Base) MCG/ACT inhaler, Inhale into the lungs every 6 (six) hours as needed for wheezing or shortness of breath., Disp: , Rfl:  .  ALPRAZolam (XANAX) 0.5 MG tablet, Take  0.5 mg by mouth at bedtime as needed for anxiety., Disp: , Rfl:  .  aspirin EC 325 MG EC tablet, Take 1 tablet (325 mg total) by mouth daily., Disp: 30 tablet, Rfl: 0 .  CRESTOR 10 MG tablet, Take 10 mg by mouth daily. , Disp: , Rfl: 0 .  diphenhydrAMINE (BENADRYL) 25 mg capsule, Take 1 capsule (25 mg total) by mouth every 6 (six) hours as needed for itching., Disp: 30 capsule, Rfl: 0 .  furosemide (LASIX) 20 MG tablet, Take 20 mg by mouth., Disp: , Rfl:  .  HYDROcodone-acetaminophen (NORCO/VICODIN) 5-325 MG per tablet, Take 1 tablet by mouth every 4 (four) hours as needed for moderate pain., Disp: 30 tablet, Rfl: 0 .  hydrOXYzine (ATARAX/VISTARIL) 10 MG tablet, Take 1 tablet (10 mg total) by mouth 3 (three) times daily as needed for itching., Disp: 30 tablet, Rfl: 0 .  insulin aspart (NOVOLOG) 100  UNIT/ML injection, Inject 0-9 Units into the skin 3 (three) times daily with meals. Sliding scale CBG 70 - 120: 0 units CBG 121 - 150: 1 unit,  CBG 151 - 200: 2 units,  CBG 201 - 250: 3 units,  CBG 251 - 300: 5 units,  CBG 301 - 350: 7 units,  CBG 351 - 400: 9 units   CBG > 400: 9 units and notify your MD, Disp: 10 mL, Rfl: 11 .  insulin detemir (LEVEMIR) 100 UNIT/ML injection, Inject 10 Units into the skin at bedtime., Disp: , Rfl:  .  Insulin Syringe-Needle U-100 31G X 15/64" 0.5 ML MISC, 1 Device by Does not apply route 3 (three) times daily with meals., Disp: 100 each, Rfl: 11 .  isosorbide mononitrate (IMDUR) 30 MG 24 hr tablet, Take 30 mg by mouth daily., Disp: , Rfl:  .  levETIRAcetam (KEPPRA) 1000 MG tablet, Take 1 tablet (1,000 mg total) by mouth 2 (two) times daily., Disp: 30 tablet, Rfl: 0 .  LORazepam (ATIVAN) 0.5 MG tablet, Take 0.5 mg by mouth every 8 (eight) hours., Disp: , Rfl:  .  metoprolol (LOPRESSOR) 50 MG tablet, Take 50 mg by mouth 2 (two) times daily. , Disp: , Rfl: 0 .  nitroGLYCERIN (NITROSTAT) 0.4 MG SL tablet, Place 0.4 mg under the tongue every 5 (five) minutes as needed for chest pain., Disp: , Rfl:  .  prasugrel (EFFIENT) 10 MG TABS tablet, Take 10 mg by mouth daily., Disp: , Rfl:  .  rizatriptan (MAXALT) 10 MG tablet, Take 10 mg by mouth as needed for migraine. May repeat in 2 hours if needed, Disp: , Rfl:  .  sertraline (ZOLOFT) 50 MG tablet, Take 100 mg by mouth every morning. , Disp: , Rfl: 0 .  traZODone (DESYREL) 25 mg TABS tablet, Take 25 mg by mouth at bedtime., Disp: , Rfl:   Allergies  Allergen Reactions  . Codeine Itching   Review of Systems:   Pertinent items are noted in the HPI. Otherwise, ROS is negative.  Vitals:   Vitals:   08/16/16 1449 08/16/16 1526 08/16/16 1533  BP: (!) 102/58    Pulse: 64    Temp: 98.2 F (36.8 C)    TempSrc: Oral    SpO2: 97% (!) 88% 95%  Weight: 239 lb 12.8 oz (108.8 kg)    Height: 5' (1.524 m)       Body mass  index is 46.83 kg/m. Physical Exam:   Physical Exam  Constitutional: She appears well-nourished. No distress.  Obese, sitting in wheelchair.  HENT:  Head: Normocephalic and atraumatic.  Eyes: Pupils are equal, round, and reactive to light. EOM are normal.  Neck: Normal range of motion. Neck supple.  Cardiovascular: Normal rate, regular rhythm, normal heart sounds and intact distal pulses.   Pulmonary/Chest: Effort normal. No respiratory distress. She has decreased breath sounds.  Abdominal: Soft.  Skin: Skin is warm. She is not diaphoretic.  Psychiatric: She has a normal mood and affect. Her behavior is normal.  Nursing note and vitals reviewed.  Results for orders placed or performed in visit on 08/16/16  CBC with Differential/Platelet  Result Value Ref Range   WBC 11.0 (H) 4.0 - 10.5 K/uL   RBC 3.69 (L) 3.87 - 5.11 Mil/uL   Hemoglobin 11.5 (L) 12.0 - 15.0 g/dL   HCT 35.3 (L) 36.0 - 46.0 %   MCV 95.6 78.0 - 100.0 fl   MCHC 32.6 30.0 - 36.0 g/dL   RDW 15.2 11.5 - 15.5 %   Platelets 326.0 150.0 - 400.0 K/uL   Neutrophils Relative % 74.7 43.0 - 77.0 %   Lymphocytes Relative 17.2 12.0 - 46.0 %   Monocytes Relative 6.6 3.0 - 12.0 %   Eosinophils Relative 0.7 0.0 - 5.0 %   Basophils Relative 0.8 0.0 - 3.0 %   Neutro Abs 8.2 (H) 1.4 - 7.7 K/uL   Lymphs Abs 1.9 0.7 - 4.0 K/uL   Monocytes Absolute 0.7 0.1 - 1.0 K/uL   Eosinophils Absolute 0.1 0.0 - 0.7 K/uL   Basophils Absolute 0.1 0.0 - 0.1 K/uL  Comprehensive metabolic panel  Result Value Ref Range   Sodium 140 135 - 145 mEq/L   Potassium 4.0 3.5 - 5.1 mEq/L   Chloride 107 96 - 112 mEq/L   CO2 25 19 - 32 mEq/L   Glucose, Bld 246 (H) 70 - 99 mg/dL   BUN 30 (H) 6 - 23 mg/dL   Creatinine, Ser 0.98 0.40 - 1.20 mg/dL   Total Bilirubin 0.3 0.2 - 1.2 mg/dL   Alkaline Phosphatase 71 39 - 117 U/L   AST 21 0 - 37 U/L   ALT 14 0 - 35 U/L   Total Protein 6.8 6.0 - 8.3 g/dL   Albumin 3.7 3.5 - 5.2 g/dL   Calcium 8.9 8.4 - 10.5  mg/dL   GFR 59.41 (L) >60.00 mL/min  TSH  Result Value Ref Range   TSH 2.37 0.35 - 4.50 uIU/mL  POCT Glucose (CBG)  Result Value Ref Range   POC Glucose 252 (A) 70 - 99 mg/dl   Assessment and Plan:   Problem  Morbid Obesity (Hcc)   The patient is asked to make an attempt to improve diet and exercise patterns to aid in medical management of this problem.    Dyslipidemia   Lab Results  Component Value Date   CHOL 167 05/29/2010   HDL 39 (L) 05/29/2010   LDLCALC  05/29/2010    96        Total Cholesterol/HDL:CHD Risk Coronary Heart Disease Risk Table                     Men   Women  1/2 Average Risk   3.4   3.3  Average Risk       5.0   4.4  2 X Average Risk   9.6   7.1  3 X Average Risk  23.4   11.0        Use the calculated Patient Ratio above and the CHD Risk  Table to determine the patient's CHD Risk.        ATP III CLASSIFICATION (LDL):  <100     mg/dL   Optimal  100-129  mg/dL   Near or Above                    Optimal  130-159  mg/dL   Borderline  160-189  mg/dL   High  >190     mg/dL   Very High   TRIG 161 (H) 05/29/2010   CHOLHDL 4.3 05/29/2010     Diabetes Mellitus (Hcc)   Current symptoms: no polyuria or polydipsia, no chest pain, dyspnea or TIA's. Taking medication compliantly without noted sided effects [x]   YES  []   NO  Episodes of hypoglycemia? []   YES  [x]   NO Maintaining a diabetic diet? []   YES  [x]   NO Trying to exercise on a regular basis? []   YES  [x]   NO  On ACE inhibitor or angiotensin II receptor blocker? []   YES  [x]   NO On Aspirin? [x]   YES  []   NO  Lab Results  Component Value Date   HGBA1C 8.1 07/15/2016    No results found for: Derl Barrow  Lab Results  Component Value Date   CHOL 167 05/29/2010   HDL 39 (L) 05/29/2010   LDLCALC  05/29/2010    96        Total Cholesterol/HDL:CHD Risk Coronary Heart Disease Risk Table                     Men   Women  1/2 Average Risk   3.4   3.3  Average Risk       5.0   4.4  2 X  Average Risk   9.6   7.1  3 X Average Risk  23.4   11.0        Use the calculated Patient Ratio above and the CHD Risk Table to determine the patient's CHD Risk.        ATP III CLASSIFICATION (LDL):  <100     mg/dL   Optimal  100-129  mg/dL   Near or Above                    Optimal  130-159  mg/dL   Borderline  160-189  mg/dL   High  >190     mg/dL   Very High   TRIG 161 (H) 05/29/2010   CHOLHDL 4.3 05/29/2010     Wt Readings from Last 3 Encounters:  08/16/16 239 lb 12.8 oz (108.8 kg)  08/28/14 248 lb 3.8 oz (112.6 kg)  08/29/14 248 lb (112.5 kg)   BP Readings from Last 3 Encounters:  08/16/16 (!) 102/58  09/02/14 134/74  08/04/14 125/70   Lab Results  Component Value Date   CREATININE 0.98 08/16/2016     Depression   Stable on Zoloft.    Essential Hypertension   Hypotension in office today. Current medications include: Furosemide, Imdur, Metoprolol.  BP Readings from Last 3 Encounters:  08/16/16 (!) 102/58  09/02/14 134/74  08/04/14 125/70   Cardiovascular ROS: positive for - dyspnea on exertion and edema; negative for - chest pain, irregular heartbeat, loss of consciousness or palpitations.  Okay to decrease Metoprolol by 1/2 if BP continues to be < 110/70 with HR < 80.   Diaphragmatic Hernia      Diverticulosis of Colon       Hypothyroidism  Lab Results  Component Value Date   TSH 2.37 08/16/2016   Treatment not on patient's medication list. Will need to clarify this diagnosis.      UPDATED MEDICATION LIST AFTER DEPRESCRIBING TODAY: .  albuterol (PROVENTIL HFA;VENTOLIN HFA) 108 (90 Base) MCG/ACT inhaler, Inhale into the lungs every 6 (six) hours as needed for wheezing or shortness of breath., Disp: , Rfl:  .  ALPRAZolam (XANAX) 0.5 MG tablet, Take 0.5 mg by mouth at bedtime as needed for anxiety., Disp: , Rfl:  .  aspirin EC 325 MG EC tablet, Take 1 tablet (325 mg total) by mouth daily., Disp: 30 tablet, Rfl: 0 .  furosemide (LASIX) 20 MG  tablet, Take 20 mg by mouth., Disp: , Rfl:  .  insulin aspart (NOVOLOG) 100 UNIT/ML injection, Inject 0-9 Units into the skin 3 (three) times daily with meals. Sliding scale CBG 70 - 120: 0 units CBG 121 - 150: 1 unit,  CBG 151 - 200: 2 units,  CBG 201 - 250: 3 units,  CBG 251 - 300: 5 units,  CBG 301 - 350: 7 units,  CBG 351 - 400: 9 units   CBG > 400: 9 units and notify your MD, Disp: 10 mL, Rfl: 11 .  insulin detemir (LEVEMIR) 100 UNIT/ML injection, Inject 10 Units into the skin at bedtime., Disp: , Rfl:  .  isosorbide mononitrate (IMDUR) 30 MG 24 hr tablet, Take 30 mg by mouth daily., Disp: , Rfl:  .  levETIRAcetam (KEPPRA) 1000 MG tablet, Take 1 tablet (1,000 mg total) by mouth 2 (two) times daily., Disp: 30 tablet, Rfl: 0 .  LORazepam (ATIVAN) 0.5 MG tablet, Take 1 tablet (0.5 mg total) by mouth 2 (two) times daily., Disp: 60 tablet, Rfl: 0 .  metoprolol (LOPRESSOR) 50 MG tablet, Take 50 mg by mouth 2 (two) times daily. , Disp: , Rfl: 0 .  nitroGLYCERIN (NITROSTAT) 0.4 MG SL tablet, Place 0.4 mg under the tongue every 5 (five) minutes as needed for chest pain., Disp: , Rfl:  .  prasugrel (EFFIENT) 10 MG TABS tablet, Take 10 mg by mouth daily., Disp: , Rfl:  .  rizatriptan (MAXALT) 10 MG tablet, Take 10 mg by mouth as needed for migraine. May repeat in 2 hours if needed, Disp: , Rfl:  .  rosuvastatin (CRESTOR) 10 MG tablet, Take 10 mg by mouth daily., Disp: , Rfl:  .  sertraline (ZOLOFT) 50 MG tablet, Take 100 mg by mouth every morning. , Disp: , Rfl: 0  Orders Placed This Encounter  Procedures  . CBC with Differential/Platelet  . Comprehensive metabolic panel  . TSH  . POCT Glucose (CBG)  . Home sleep test   Meds ordered this encounter  Medications  . nitroGLYCERIN (NITROSTAT) 0.4 MG SL tablet    Sig: Place 0.4 mg under the tongue every 5 (five) minutes as needed for chest pain.  . rizatriptan (MAXALT) 10 MG tablet    Sig: Take 10 mg by mouth as needed for migraine. May repeat in 2  hours if needed  . albuterol (PROVENTIL HFA;VENTOLIN HFA) 108 (90 Base) MCG/ACT inhaler    Sig: Inhale into the lungs every 6 (six) hours as needed for wheezing or shortness of breath.  . rosuvastatin (CRESTOR) 10 MG tablet    Sig: Take 10 mg by mouth daily.  Marland Kitchen LORazepam (ATIVAN) 0.5 MG tablet    Sig: Take 1 tablet (0.5 mg total) by mouth 2 (two) times daily.    Dispense:  60  tablet    Refill:  0   . Reviewed expectations re: course of current medical issues. . Discussed self-management of symptoms. . Outlined signs and symptoms indicating need for more acute intervention. . Patient verbalized understanding and all questions were answered. Marland Kitchen Health Maintenance issues including appropriate healthy diet, exercise, and smoking avoidance were discussed with patient. . See orders for this visit as documented in the electronic medical record. . Patient received an After Visit Summary.  CMA served as Education administrator during this visit. History, Physical, and Plan performed by medical provider. The above documentation has been reviewed and is accurate and complete. Briscoe Deutscher, D.O.  Records requested if needed. Time spent with the patient: 60 minutes, of which >50% was spent in obtaining information about her symptoms, reviewing her previous labs, evaluations, and treatments, counseling her about her condition (please see the discussed topics above), and developing a plan to further investigate it; she had a number of questions which I addressed.   Briscoe Deutscher, DO [place] [place] 08/29/2016  Future Appointments Date Time Provider [place]  08/30/2016 11:00 AM Renato Shin, MD [facility] None  09/15/2016 9:00 AM Stephanie Acre, RN [facility] None  09/15/2016 10:00 AM Briscoe Deutscher, DO [facility] None  09/15/2016 2:00 PM Renato Shin, MD [facility] None

## 2016-08-17 LAB — CBC WITH DIFFERENTIAL/PLATELET
Basophils Absolute: 0.1 10*3/uL (ref 0.0–0.1)
Basophils Relative: 0.8 % (ref 0.0–3.0)
Eosinophils Absolute: 0.1 10*3/uL (ref 0.0–0.7)
Eosinophils Relative: 0.7 % (ref 0.0–5.0)
HCT: 35.3 % — ABNORMAL LOW (ref 36.0–46.0)
Hemoglobin: 11.5 g/dL — ABNORMAL LOW (ref 12.0–15.0)
Lymphocytes Relative: 17.2 % (ref 12.0–46.0)
Lymphs Abs: 1.9 10*3/uL (ref 0.7–4.0)
MCHC: 32.6 g/dL (ref 30.0–36.0)
MCV: 95.6 fl (ref 78.0–100.0)
Monocytes Absolute: 0.7 10*3/uL (ref 0.1–1.0)
Monocytes Relative: 6.6 % (ref 3.0–12.0)
Neutro Abs: 8.2 10*3/uL — ABNORMAL HIGH (ref 1.4–7.7)
Neutrophils Relative %: 74.7 % (ref 43.0–77.0)
Platelets: 326 10*3/uL (ref 150.0–400.0)
RBC: 3.69 Mil/uL — ABNORMAL LOW (ref 3.87–5.11)
RDW: 15.2 % (ref 11.5–15.5)
WBC: 11 10*3/uL — ABNORMAL HIGH (ref 4.0–10.5)

## 2016-08-17 LAB — COMPREHENSIVE METABOLIC PANEL
ALT: 14 U/L (ref 0–35)
AST: 21 U/L (ref 0–37)
Albumin: 3.7 g/dL (ref 3.5–5.2)
Alkaline Phosphatase: 71 U/L (ref 39–117)
BUN: 30 mg/dL — ABNORMAL HIGH (ref 6–23)
CO2: 25 mEq/L (ref 19–32)
Calcium: 8.9 mg/dL (ref 8.4–10.5)
Chloride: 107 mEq/L (ref 96–112)
Creatinine, Ser: 0.98 mg/dL (ref 0.40–1.20)
GFR: 59.41 mL/min — ABNORMAL LOW (ref >60.00)
Glucose, Bld: 246 mg/dL — ABNORMAL HIGH (ref 70–99)
Potassium: 4 mEq/L (ref 3.5–5.1)
Sodium: 140 mEq/L (ref 135–145)
Total Bilirubin: 0.3 mg/dL (ref 0.2–1.2)
Total Protein: 6.8 g/dL (ref 6.0–8.3)

## 2016-08-17 LAB — TSH: TSH: 2.37 u[IU]/mL (ref 0.35–4.50)

## 2016-08-20 DIAGNOSIS — G43909 Migraine, unspecified, not intractable, without status migrainosus: Secondary | ICD-10-CM | POA: Insufficient documentation

## 2016-08-23 ENCOUNTER — Encounter: Payer: Self-pay | Admitting: Physician Assistant

## 2016-08-23 ENCOUNTER — Telehealth: Payer: Self-pay | Admitting: *Deleted

## 2016-08-23 NOTE — Telephone Encounter (Signed)
Spoke to [name]. She states that she stopped vomiting on Saturday. She also admits to dry cough. Denies fever or nasal drainage. Did not go to ED or UC. She is able to eat and drink. I reinforced the importance of drinking water to stay hydrated. Her CBG this morning was 138. Her BP yesterday was 90/68. I asked pt to recheck BP today and call office to make an appointment if it is still low. Pt states that she is feeling better and does not believe she needs to come in. I asked pt to call office and make appointment if she starts getting worse again or does not continue to get better.

## 2016-08-23 NOTE — Telephone Encounter (Signed)
FYI

## 2016-08-23 NOTE — Progress Notes (Signed)
error 

## 2016-08-23 NOTE — Telephone Encounter (Signed)
Patient did not arrive to [facility] ED facility. Plans to contact patient shortly.  ---------------------------------------------------- PLEASE NOTE: All timestamps contained within this report are represented as [place] Standard Time. CONFIDENTIALTY NOTICE: This fax transmission is intended only for the addressee. It contains information that is legally privileged, confidential or otherwise protected from use or disclosure. If you are not the intended recipient, you are strictly prohibited from reviewing, disclosing, copying using or disseminating any of this information or taking any action in reliance on or regarding this information. If you have received this fax in error, please notify us immediately by telephone so that we can arrange for its return to [place]. Phone: [phone], Toll-Free: [phone], Fax: [phone] Page: 1 of 2 Call Id: [id number] [place] [facility] [place] Patient Name: [name] Gender: Female DOB: 1945-06-18 Age: 71 Y 22 M 1 D Return Phone Number: [phone] (Primary), [phone] (Secondary) City/State/Zip: [place] [address] Client [place] [facility] [place] Client Site [place] [facility] [place] Night Physician Briscoe Deutscher- DO Who Is Calling Patient / Member / Family / Caregiver Call Type Triage / Clinical Relationship To Patient Self Return Phone Number Please choose phone number Chief Complaint Vomiting Reason for Call Symptomatic / Request for Health Information Initial Comment Caller states saw doctor on Tuesday for low blood pressure. She is now vomiting. She is wanting to know if there is something the office can call her in for upset stomach. Nurse Assessment Nurse: Junius Creamer, RN, [place] Date/Time (Eastern Time): 08/20/2016 6:28:13 PM Confirm and document reason for call. If symptomatic, describe symptoms. ---Caller states she  is now vomiting. She is wanting to know if there is something the office can call her in for upset stomach. started about 4 hours ago. now is throwing up foamy stuff. denies fever, diarrhea, abd pain. glucose 117 Does the PT have any chronic conditions? (i.e. diabetes, asthma, etc.) ---Yes List chronic conditions. ---heart problems dm Guidelines Guideline Title Affirmed Question Vomiting [1] MODERATE vomiting (e.g., 3 - 5 times/day) AND [2] age > 72 Disp. Time Eilene Ghazi Time) Disposition Final User 08/20/2016 6:36:37 PM Go to ED Now (or PCP triage) Yes Junius Creamer, RN, [place] REFUSED Care Advice Given Per Guideline GO TO ED NOW (OR PCP TRIAGE): * IF NO PCP TRIAGE: You need to be seen. Go to the [facility] at _____________ Hospital within the next hour. Leave as soon as you can. CONTAINER: You may wish to bring a bucket or container with you in case there is more vomiting during the drive. BRING MEDICINES: * Please bring a list of your current medicines when you go to see the doctor. * It is also a good idea to bring the pill bottles too. This will help the doctor to make certain you are taking the right medicines and the right dose. CARE ADVICE per Vomiting (Adult) guideline. PLEASE NOTE: All timestamps contained within this report are represented as [place] Standard Time. CONFIDENTIALTY NOTICE: This fax transmission is intended only for the addressee. It contains information that is legally privileged, confidential or otherwise protected from use or disclosure. If you are not the intended recipient, you are strictly prohibited from reviewing, disclosing, copying using or disseminating any of this information or taking any action in reliance on or regarding this information. If you have received this fax in error, please notify us immediately by telephone so that we can arrange for its  return to [place]. Phone: [phone], Toll-Free: [phone], Fax: [phone] Page: 2 of  2 Call Id: [id number] Comments User: Sandi Carne, RN Date/Time Eilene Ghazi Time): 08/20/2016 6:34:25 PM emesis more than 10 times

## 2016-08-24 ENCOUNTER — Ambulatory Visit: Payer: Medicare Other | Admitting: Family Medicine

## 2016-08-26 NOTE — Telephone Encounter (Signed)
I reviewed this information with Lucina Mellow, RN and agree with her assessment and plan.  Inda Coke, PA-C 08/26/16

## 2016-08-26 NOTE — Telephone Encounter (Signed)
Please contact patient to see Dr. Jerline Pain today.

## 2016-08-26 NOTE — Telephone Encounter (Signed)
Called patient and tried to get scheduled with Dr. Jerline Pain this afternoon. Patient stated she "can't go anywhere. She is having diarrhea, vomiting and can't breathe." Patient wanted to know if there was any medication she could take. I informed patient I would leave a note with the provider and have someone contact her about that.

## 2016-08-26 NOTE — Telephone Encounter (Signed)
Patient called today because she is vomiting, has diarrhea, and has a cold.   I see that she did not show up for her acute visit to see Juleen China on 08/21, and she repeated multiple times that someone from Dr. Alcario Drought team told her that she is supposed to see Dr. Loanne Drilling in Endocrinology for these symptoms?  Please advise,  -LL

## 2016-08-26 NOTE — Telephone Encounter (Signed)
Called patient to make aware she needs to go to ED. Patient stated she does not want the ED nor does she want to be seen today. Patient further stated she will call the office tomorrow or go to ED tomorrow if she still isn't feeling well. I educated patient her symptoms can potentially be serious and it is highly recommended she go to ED. Offered patient to call EMS for her if she didn't want to drive. Patient declined all services and said she will call ED or our office tomorrow. Unable to make any progress with patient.

## 2016-08-30 ENCOUNTER — Ambulatory Visit: Payer: Medicare Other | Admitting: Endocrinology

## 2016-08-30 ENCOUNTER — Telehealth: Payer: Self-pay | Admitting: Family Medicine

## 2016-08-30 MED ORDER — LEVETIRACETAM 1000 MG PO TABS: 1000.0000 mg | ORAL_TABLET | Freq: Two times a day (BID) | ORAL | 0 refills | Status: DC

## 2016-08-30 NOTE — Telephone Encounter (Signed)
Spoke with patients husband and advised that we need more specific information on what needs to be refilled. We need to know the name of the medication, dose, and supply needed. I also advised that at the next appointment patient needs to bring all medication bottles with her so we can get an accurate list. They will get a list and call back to let us know what she needs.

## 2016-08-30 NOTE — Telephone Encounter (Signed)
Last several notes reviewed. Patient seems to be noncompliant with medical advise - was told to go to ER twice and did not. Known severe heart disease. Missed appointment with Endocrine today. This will need to be considered going forward.

## 2016-08-30 NOTE — Telephone Encounter (Signed)
Spoke with patient and advised per Dr. Juleen China that cardiology will have to fill Lasix, Nitroglycerin, and Imdur. Per Dr. Juleen China of to refill the [place]. I have sent in 1 month supply of the Keppra to [facility].

## 2016-08-30 NOTE — Telephone Encounter (Signed)
Patient called back and clinical staff not available. She said that Dr. Juleen China had updated all of her medications at her recent visit and had changed some of her medications. She states that she does not have the bottles on some of them. Dr. Juleen China has not filled any of these yet.   Keppra 1000 mg two times daily Lasix(generic) 40 mg one time daily  Nitroglycerin 1 tablet 4 mg Isosorbide mononitrate 30 mg  There are other medications to fill as well. Please call and patient will go over  Medications

## 2016-08-30 NOTE — Telephone Encounter (Signed)
Patient calling to state she needed "all her medication refilled". Was unable to give me names or whether she needed a 90 day supply. Please advise.

## 2016-09-02 ENCOUNTER — Ambulatory Visit: Payer: Medicare Other | Admitting: Endocrinology

## 2016-09-09 ENCOUNTER — Encounter (HOSPITAL_COMMUNITY): Payer: Self-pay | Admitting: Emergency Medicine

## 2016-09-09 ENCOUNTER — Emergency Department (HOSPITAL_COMMUNITY): Payer: Medicare Other

## 2016-09-09 ENCOUNTER — Inpatient Hospital Stay (HOSPITAL_COMMUNITY)
Admission: EM | Admit: 2016-09-09 | Discharge: 2016-09-12 | DRG: 292 | Disposition: A | Discharge status: Home Health | Payer: Medicare Other | Attending: Internal Medicine | Admitting: Internal Medicine

## 2016-09-09 DIAGNOSIS — R748 Abnormal levels of other serum enzymes: Secondary | ICD-10-CM

## 2016-09-09 DIAGNOSIS — R7989 Other specified abnormal findings of blood chemistry: Secondary | ICD-10-CM | POA: Diagnosis present

## 2016-09-09 DIAGNOSIS — E1029 Type 1 diabetes mellitus with other diabetic kidney complication: Secondary | ICD-10-CM

## 2016-09-09 DIAGNOSIS — Z794 Long term (current) use of insulin: Secondary | ICD-10-CM

## 2016-09-09 DIAGNOSIS — K589 Irritable bowel syndrome without diarrhea: Secondary | ICD-10-CM | POA: Diagnosis present

## 2016-09-09 DIAGNOSIS — R0602 Shortness of breath: Secondary | ICD-10-CM | POA: Diagnosis not present

## 2016-09-09 DIAGNOSIS — R778 Other specified abnormalities of plasma proteins: Secondary | ICD-10-CM | POA: Diagnosis present

## 2016-09-09 DIAGNOSIS — K449 Diaphragmatic hernia without obstruction or gangrene: Secondary | ICD-10-CM | POA: Diagnosis present

## 2016-09-09 DIAGNOSIS — G40909 Epilepsy, unspecified, not intractable, without status epilepticus: Secondary | ICD-10-CM | POA: Diagnosis not present

## 2016-09-09 DIAGNOSIS — Z90710 Acquired absence of both cervix and uterus: Secondary | ICD-10-CM

## 2016-09-09 DIAGNOSIS — R627 Adult failure to thrive: Secondary | ICD-10-CM | POA: Diagnosis present

## 2016-09-09 DIAGNOSIS — K760 Fatty (change of) liver, not elsewhere classified: Secondary | ICD-10-CM | POA: Diagnosis present

## 2016-09-09 DIAGNOSIS — R011 Cardiac murmur, unspecified: Secondary | ICD-10-CM | POA: Diagnosis present

## 2016-09-09 DIAGNOSIS — Z8673 Personal history of transient ischemic attack (TIA), and cerebral infarction without residual deficits: Secondary | ICD-10-CM

## 2016-09-09 DIAGNOSIS — I252 Old myocardial infarction: Secondary | ICD-10-CM | POA: Diagnosis not present

## 2016-09-09 DIAGNOSIS — E119 Type 2 diabetes mellitus without complications: Secondary | ICD-10-CM | POA: Diagnosis present

## 2016-09-09 DIAGNOSIS — F329 Major depressive disorder, single episode, unspecified: Secondary | ICD-10-CM | POA: Diagnosis present

## 2016-09-09 DIAGNOSIS — I5043 Acute on chronic combined systolic (congestive) and diastolic (congestive) heart failure: Secondary | ICD-10-CM | POA: Diagnosis present

## 2016-09-09 DIAGNOSIS — F339 Major depressive disorder, recurrent, unspecified: Secondary | ICD-10-CM | POA: Diagnosis present

## 2016-09-09 DIAGNOSIS — I110 Hypertensive heart disease with heart failure: Principal | ICD-10-CM | POA: Diagnosis present

## 2016-09-09 DIAGNOSIS — E785 Hyperlipidemia, unspecified: Secondary | ICD-10-CM

## 2016-09-09 DIAGNOSIS — Z6841 Body Mass Index (BMI) 40.0 and over, adult: Secondary | ICD-10-CM | POA: Diagnosis not present

## 2016-09-09 DIAGNOSIS — I350 Nonrheumatic aortic (valve) stenosis: Secondary | ICD-10-CM | POA: Diagnosis present

## 2016-09-09 DIAGNOSIS — E1169 Type 2 diabetes mellitus with other specified complication: Secondary | ICD-10-CM | POA: Diagnosis present

## 2016-09-09 DIAGNOSIS — Z7982 Long term (current) use of aspirin: Secondary | ICD-10-CM

## 2016-09-09 DIAGNOSIS — I248 Other forms of acute ischemic heart disease: Secondary | ICD-10-CM | POA: Diagnosis present

## 2016-09-09 DIAGNOSIS — F411 Generalized anxiety disorder: Secondary | ICD-10-CM | POA: Diagnosis present

## 2016-09-09 DIAGNOSIS — I2510 Atherosclerotic heart disease of native coronary artery without angina pectoris: Secondary | ICD-10-CM | POA: Diagnosis present

## 2016-09-09 DIAGNOSIS — F419 Anxiety disorder, unspecified: Secondary | ICD-10-CM | POA: Diagnosis present

## 2016-09-09 DIAGNOSIS — I10 Essential (primary) hypertension: Secondary | ICD-10-CM

## 2016-09-09 DIAGNOSIS — Z825 Family history of asthma and other chronic lower respiratory diseases: Secondary | ICD-10-CM

## 2016-09-09 DIAGNOSIS — E1159 Type 2 diabetes mellitus with other circulatory complications: Secondary | ICD-10-CM | POA: Diagnosis present

## 2016-09-09 DIAGNOSIS — Z833 Family history of diabetes mellitus: Secondary | ICD-10-CM

## 2016-09-09 DIAGNOSIS — I259 Chronic ischemic heart disease, unspecified: Secondary | ICD-10-CM

## 2016-09-09 DIAGNOSIS — E039 Hypothyroidism, unspecified: Secondary | ICD-10-CM | POA: Diagnosis present

## 2016-09-09 DIAGNOSIS — K219 Gastro-esophageal reflux disease without esophagitis: Secondary | ICD-10-CM | POA: Diagnosis present

## 2016-09-09 DIAGNOSIS — I5033 Acute on chronic diastolic (congestive) heart failure: Secondary | ICD-10-CM | POA: Diagnosis present

## 2016-09-09 DIAGNOSIS — Z8249 Family history of ischemic heart disease and other diseases of the circulatory system: Secondary | ICD-10-CM

## 2016-09-09 DIAGNOSIS — Z955 Presence of coronary angioplasty implant and graft: Secondary | ICD-10-CM

## 2016-09-09 DIAGNOSIS — Z87891 Personal history of nicotine dependence: Secondary | ICD-10-CM

## 2016-09-09 DIAGNOSIS — Z79899 Other long term (current) drug therapy: Secondary | ICD-10-CM

## 2016-09-09 DIAGNOSIS — R069 Unspecified abnormalities of breathing: Secondary | ICD-10-CM | POA: Diagnosis not present

## 2016-09-09 DIAGNOSIS — Z885 Allergy status to narcotic agent status: Secondary | ICD-10-CM

## 2016-09-09 DIAGNOSIS — I351 Nonrheumatic aortic (valve) insufficiency: Secondary | ICD-10-CM | POA: Diagnosis not present

## 2016-09-09 LAB — BASIC METABOLIC PANEL
Anion gap: 8 (ref 5–15)
BUN: 24 mg/dL — ABNORMAL HIGH (ref 6–20)
CHLORIDE: 110 mmol/L (ref 101–111)
CO2: 23 mmol/L (ref 22–32)
CREATININE: 0.87 mg/dL (ref 0.44–1.00)
Calcium: 8.6 mg/dL — ABNORMAL LOW (ref 8.9–10.3)
GFR calc non Af Amer: >60 mL/min (ref >60)
GLUCOSE: 264 mg/dL — AB (ref 65–99)
Potassium: 3.9 mmol/L (ref 3.5–5.1)
Sodium: 141 mmol/L (ref 135–145)

## 2016-09-09 LAB — GLUCOSE, CAPILLARY: Glucose-Capillary: 236 mg/dL — ABNORMAL HIGH (ref 65–99)

## 2016-09-09 LAB — CBC
HEMATOCRIT: 33.7 % — AB (ref 36.0–46.0)
HEMOGLOBIN: 10.7 g/dL — AB (ref 12.0–15.0)
MCH: 30.2 pg (ref 26.0–34.0)
MCHC: 31.8 g/dL (ref 30.0–36.0)
MCV: 95.2 fL (ref 78.0–100.0)
Platelets: 230 10*3/uL (ref 150–400)
RBC: 3.54 MIL/uL — ABNORMAL LOW (ref 3.87–5.11)
RDW: 15 % (ref 11.5–15.5)
WBC: 7.3 10*3/uL (ref 4.0–10.5)

## 2016-09-09 LAB — CBG MONITORING, ED: Glucose-Capillary: 258 mg/dL — ABNORMAL HIGH (ref 65–99)

## 2016-09-09 LAB — BRAIN NATRIURETIC PEPTIDE: B NATRIURETIC PEPTIDE 5: 847.5 pg/mL — AB (ref 0.0–100.0)

## 2016-09-09 LAB — TROPONIN I: Troponin I: 0.07 ng/mL (ref <0.03)

## 2016-09-09 MED ORDER — SODIUM CHLORIDE 0.9% FLUSH
3.0000 mL | Freq: Two times a day (BID) | INTRAVENOUS | Status: DC
  Administered 2016-09-09 – 2016-09-12 (×6): 3 mL via INTRAVENOUS

## 2016-09-09 MED ORDER — INSULIN ASPART 100 UNIT/ML ~~LOC~~ SOLN
0.0000 [IU] | Freq: Three times a day (TID) | SUBCUTANEOUS | Status: DC
  Administered 2016-09-10 (×2): 1 [IU] via SUBCUTANEOUS
  Administered 2016-09-10: 2 [IU] via SUBCUTANEOUS
  Administered 2016-09-11: 3 [IU] via SUBCUTANEOUS
  Administered 2016-09-11 – 2016-09-12 (×4): 2 [IU] via SUBCUTANEOUS

## 2016-09-09 MED ORDER — LEVETIRACETAM 500 MG PO TABS
1000.0000 mg | ORAL_TABLET | Freq: Two times a day (BID) | ORAL | Status: DC
  Administered 2016-09-09 – 2016-09-10 (×3): 1000 mg via ORAL
  Administered 2016-09-11: 500 mg via ORAL
  Administered 2016-09-11 – 2016-09-12 (×2): 1000 mg via ORAL
  Filled 2016-09-09 (×6): qty 2

## 2016-09-09 MED ORDER — FUROSEMIDE 10 MG/ML IJ SOLN
40.0000 mg | Freq: Two times a day (BID) | INTRAMUSCULAR | Status: DC
  Administered 2016-09-09 – 2016-09-10 (×3): 40 mg via INTRAVENOUS
  Filled 2016-09-09 (×3): qty 4

## 2016-09-09 MED ORDER — HYDROXYZINE HCL 10 MG PO TABS
10.0000 mg | ORAL_TABLET | Freq: Three times a day (TID) | ORAL | Status: DC | PRN
  Administered 2016-09-10: 10 mg via ORAL
  Filled 2016-09-09: qty 1

## 2016-09-09 MED ORDER — ROSUVASTATIN CALCIUM 10 MG PO TABS
10.0000 mg | ORAL_TABLET | Freq: Every day | ORAL | Status: DC
  Administered 2016-09-10 – 2016-09-12 (×3): 10 mg via ORAL
  Filled 2016-09-09 (×2): qty 1

## 2016-09-09 MED ORDER — ASPIRIN EC 325 MG PO TBEC
325.0000 mg | DELAYED_RELEASE_TABLET | Freq: Every day | ORAL | Status: DC
  Administered 2016-09-10 – 2016-09-12 (×3): 325 mg via ORAL
  Filled 2016-09-09 (×3): qty 1

## 2016-09-09 MED ORDER — INSULIN DETEMIR 100 UNIT/ML ~~LOC~~ SOLN
7.0000 [IU] | Freq: Every day | SUBCUTANEOUS | Status: DC
  Administered 2016-09-10 – 2016-09-11 (×3): 7 [IU] via SUBCUTANEOUS
  Filled 2016-09-09 (×4): qty 0.07

## 2016-09-09 MED ORDER — ALPRAZOLAM 0.25 MG PO TABS: 0.5000 mg | ORAL_TABLET | Freq: Every evening | ORAL | Status: DC | PRN

## 2016-09-09 MED ORDER — METOPROLOL TARTRATE 50 MG PO TABS
50.0000 mg | ORAL_TABLET | Freq: Two times a day (BID) | ORAL | Status: DC
  Administered 2016-09-09 – 2016-09-12 (×6): 50 mg via ORAL
  Filled 2016-09-09: qty 1
  Filled 2016-09-09: qty 2
  Filled 2016-09-09 (×4): qty 1

## 2016-09-09 MED ORDER — FUROSEMIDE 10 MG/ML IJ SOLN
40.0000 mg | Freq: Once | INTRAMUSCULAR | Status: DC
  Filled 2016-09-09: qty 4

## 2016-09-09 MED ORDER — ACETAMINOPHEN 325 MG PO TABS
650.0000 mg | ORAL_TABLET | ORAL | Status: DC | PRN
  Administered 2016-09-09 – 2016-09-10 (×2): 650 mg via ORAL
  Filled 2016-09-09 (×2): qty 2

## 2016-09-09 MED ORDER — SODIUM CHLORIDE 0.9 % IV SOLN: 250.0000 mL | INTRAVENOUS | Status: DC | PRN

## 2016-09-09 MED ORDER — "INSULIN SYRINGE-NEEDLE U-100 31G X 15/64"" 0.5 ML MISC": 1.0000 | Freq: Three times a day (TID) | Status: DC

## 2016-09-09 MED ORDER — PRASUGREL HCL 10 MG PO TABS
10.0000 mg | ORAL_TABLET | Freq: Every day | ORAL | Status: DC
  Administered 2016-09-10 – 2016-09-12 (×3): 10 mg via ORAL
  Filled 2016-09-09 (×3): qty 1

## 2016-09-09 MED ORDER — LORAZEPAM 0.5 MG PO TABS
0.5000 mg | ORAL_TABLET | Freq: Two times a day (BID) | ORAL | Status: DC
  Administered 2016-09-10 – 2016-09-12 (×5): 0.5 mg via ORAL
  Filled 2016-09-09 (×5): qty 1

## 2016-09-09 MED ORDER — LISINOPRIL 5 MG PO TABS
5.0000 mg | ORAL_TABLET | Freq: Every day | ORAL | Status: DC
  Administered 2016-09-10 – 2016-09-12 (×3): 5 mg via ORAL
  Filled 2016-09-09 (×3): qty 1

## 2016-09-09 MED ORDER — LORAZEPAM 1 MG PO TABS
1.0000 mg | ORAL_TABLET | Freq: Once | ORAL | Status: DC
  Filled 2016-09-09: qty 1

## 2016-09-09 MED ORDER — NITROGLYCERIN 0.4 MG SL SUBL: 0.4000 mg | SUBLINGUAL_TABLET | SUBLINGUAL | Status: DC | PRN

## 2016-09-09 MED ORDER — ENOXAPARIN SODIUM 40 MG/0.4ML ~~LOC~~ SOLN
40.0000 mg | SUBCUTANEOUS | Status: DC
  Administered 2016-09-10 – 2016-09-12 (×3): 40 mg via SUBCUTANEOUS
  Filled 2016-09-09 (×3): qty 0.4

## 2016-09-09 MED ORDER — ASPIRIN 81 MG PO CHEW
324.0000 mg | CHEWABLE_TABLET | Freq: Once | ORAL | Status: DC
  Filled 2016-09-09: qty 4

## 2016-09-09 MED ORDER — ALBUTEROL SULFATE (2.5 MG/3ML) 0.083% IN NEBU
5.0000 mg | INHALATION_SOLUTION | RESPIRATORY_TRACT | Status: DC | PRN
  Administered 2016-09-11: 5 mg via RESPIRATORY_TRACT
  Filled 2016-09-09: qty 6

## 2016-09-09 MED ORDER — ALBUTEROL SULFATE (2.5 MG/3ML) 0.083% IN NEBU
5.0000 mg | INHALATION_SOLUTION | Freq: Once | RESPIRATORY_TRACT | Status: AC
  Administered 2016-09-09: 5 mg via RESPIRATORY_TRACT
  Filled 2016-09-09: qty 6

## 2016-09-09 MED ORDER — HYDRALAZINE HCL 20 MG/ML IJ SOLN: 5.0000 mg | INTRAMUSCULAR | Status: DC | PRN

## 2016-09-09 MED ORDER — SERTRALINE HCL 100 MG PO TABS
100.0000 mg | ORAL_TABLET | Freq: Every morning | ORAL | Status: DC
  Administered 2016-09-10 – 2016-09-12 (×3): 100 mg via ORAL
  Filled 2016-09-09 (×3): qty 1

## 2016-09-09 MED ORDER — LORAZEPAM 1 MG PO TABS
1.0000 mg | ORAL_TABLET | Freq: Once | ORAL | Status: AC
  Administered 2016-09-09: 1 mg via ORAL

## 2016-09-09 MED ORDER — ONDANSETRON HCL 4 MG/2ML IJ SOLN: 4.0000 mg | Freq: Four times a day (QID) | INTRAMUSCULAR | Status: DC | PRN

## 2016-09-09 MED ORDER — FUROSEMIDE 10 MG/ML IJ SOLN
INTRAMUSCULAR | Status: AC
  Filled 2016-09-09: qty 4

## 2016-09-09 MED ORDER — FUROSEMIDE 20 MG PO TABS: 40.0000 mg | ORAL_TABLET | Freq: Once | ORAL | Status: DC

## 2016-09-09 MED ORDER — SODIUM CHLORIDE 0.9% FLUSH: 3.0000 mL | INTRAVENOUS | Status: DC | PRN

## 2016-09-09 MED ORDER — ALPRAZOLAM 0.5 MG PO TABS
1.0000 mg | ORAL_TABLET | Freq: Every evening | ORAL | Status: DC | PRN
  Administered 2016-09-10 – 2016-09-11 (×2): 1 mg via ORAL
  Filled 2016-09-09 (×2): qty 2

## 2016-09-09 MED ORDER — ISOSORBIDE MONONITRATE ER 30 MG PO TB24
30.0000 mg | ORAL_TABLET | Freq: Every day | ORAL | Status: DC
  Administered 2016-09-10 – 2016-09-12 (×3): 30 mg via ORAL
  Filled 2016-09-09 (×3): qty 1

## 2016-09-09 MED ORDER — SUMATRIPTAN SUCCINATE 50 MG PO TABS
50.0000 mg | ORAL_TABLET | Freq: Two times a day (BID) | ORAL | Status: DC | PRN
  Administered 2016-09-10 – 2016-09-12 (×4): 50 mg via ORAL
  Filled 2016-09-09 (×4): qty 1

## 2016-09-09 NOTE — H&P (Signed)
History and Physical    Amahia Hooper MVH:[id number] DOB: July 19, 1945 DOA: 09/09/2016  Referring MD/NP/PA:   PCP: Briscoe Deutscher, DO   Patient coming from:  The patient is coming from home.  At baseline, pt is independent for most of ADL.   Chief Complaint: SOB  HPI: [name] is a 71 y.o. female with medical history significant of dCHF, hypertension, hyperlipidemia, diabetes mellitus, GERD, depression, anxiety, seizure, obesity, CAD, 5 stent placement, who presents with shortness breath.  Patient states that she has been having shortness of breath in the past 3 weeks, which has been progressively getting worse. She states that she had one episode of chest pain 4 days ago, which has resolved. Currently no chest pain. She has mild dry cough, but no fever or chills. Patient states that she did not take her lasix for about 1 week. She has gained weight approximately 10 pounds recently. No nausea or vomiting, diarrhea, abdominal pain currently. She states that sometimes she has nausea and vomiting but not today. No symptoms of UTI or unilateral weakness.  ED Course: pt was found to have BNP 847.5, troponin +0.07, electrolytes renal function okay, temperature normal, no tachycardia, oxygen saturation 98% on room air, negative chest x-ray for acute issues. Patient is a admitted to telemetry bed as inpatient.  Review of Systems:   General: no fevers, chills, no body weight gain, has fatigue HEENT: no blurry vision, hearing changes or sore throat Respiratory: has dyspnea, coughing, wheezing CV: no chest pain, no palpitations GI: no nausea, vomiting, abdominal pain, diarrhea, constipation GU: no dysuria, burning on urination, increased urinary frequency, hematuria  Ext: has leg edema Neuro: no unilateral weakness, numbness, or tingling, no vision change or hearing loss Skin: no rash, no skin tear. MSK: No muscle spasm, no deformity, no limitation of range of movement in spin Heme: No easy  bruising.  Travel history: No recent long distant travel.  Allergy:  Allergies  Allergen Reactions  . Codeine Itching    Past Medical History:  Diagnosis Date  . Anxiety   . Coronary artery disease   . Depression   . Diabetes mellitus    insulin dependent  . Edema of lower extremity   . Esophageal stricture 2008   EGD  . Fatigue   . GERD (gastroesophageal reflux disease)   . Heart murmur   . Hiatal hernia 2008   EGD  . Hyperlipidemia   . Hypertension   . Knee pain   . Morbid obesity ([place])   . Nonproductive cough    chronic  . Orthopnea   . Seizure disorder ([place])   . SOB (shortness of breath)   . Syncope and collapse   . Tremor     Past Surgical History:  Procedure Laterality Date  . ABDOMINAL HYSTERECTOMY    . CARDIAC CATHETERIZATION  03/28/2009  . KNEE ARTHROSCOPY    . TRANSTHORACIC ECHOCARDIOGRAM     showed ef of 65% with no regional wall motion abnormalities    Social History:  reports that she quit smoking about 46 years ago. She has never used smokeless tobacco. She reports that she does not drink alcohol or use drugs.  Family History:  Family History  Problem Relation Age of Onset  . Heart attack Mother   . Hypertension Mother   . COPD Father   . Heart disease Sister   . Diabetes Brother   . Diabetes Brother   . Prostate cancer Brother   . Kidney disease Brother  Prior to Admission medications   Medication Sig Start Date End Date Taking? Authorizing Provider  albuterol (PROVENTIL HFA;VENTOLIN HFA) 108 (90 Base) MCG/ACT inhaler Inhale into the lungs every 6 (six) hours as needed for wheezing or shortness of breath.    [provider]  ALPRAZolam Duanne Moron) 0.5 MG tablet Take 0.5 mg by mouth at bedtime as needed for anxiety.    [provider]  aspirin EC 325 MG EC tablet Take 1 tablet (325 mg total) by mouth daily. 09/02/14   Rai, Vernelle Emerald, MD  furosemide (LASIX) 20 MG tablet Take 20 mg by mouth.    [provider]    hydrOXYzine (ATARAX/VISTARIL) 10 MG tablet Take 1 tablet (10 mg total) by mouth 3 (three) times daily as needed for itching. 09/02/14   Rai, Ripudeep K, MD  insulin aspart (NOVOLOG) 100 UNIT/ML injection Inject 0-9 Units into the skin 3 (three) times daily with meals. Sliding scale CBG 70 - 120: 0 units CBG 121 - 150: 1 unit,  CBG 151 - 200: 2 units,  CBG 201 - 250: 3 units,  CBG 251 - 300: 5 units,  CBG 301 - 350: 7 units,  CBG 351 - 400: 9 units   CBG > 400: 9 units and notify your MD 09/02/14   Rai, Vernelle Emerald, MD  insulin detemir (LEVEMIR) 100 UNIT/ML injection Inject 10 Units into the skin at bedtime.    [provider]  Insulin Syringe-Needle U-100 31G X 15/64" 0.5 ML MISC 1 Device by Does not apply route 3 (three) times daily with meals. 11/07/13   Renato Shin, MD  isosorbide mononitrate (IMDUR) 30 MG 24 hr tablet Take 30 mg by mouth daily.    [provider]  levETIRAcetam (KEPPRA) 1000 MG tablet Take 1 tablet (1,000 mg total) by mouth 2 (two) times daily. 08/30/16   Briscoe Deutscher, DO  LORazepam (ATIVAN) 0.5 MG tablet Take 1 tablet (0.5 mg total) by mouth 2 (two) times daily. 08/16/16   Briscoe Deutscher, DO  metoprolol (LOPRESSOR) 50 MG tablet Take 50 mg by mouth 2 (two) times daily.  07/02/14   [provider]  nitroGLYCERIN (NITROSTAT) 0.4 MG SL tablet Place 0.4 mg under the tongue every 5 (five) minutes as needed for chest pain.    [provider]  prasugrel (EFFIENT) 10 MG TABS tablet Take 10 mg by mouth daily.    [provider]  rizatriptan (MAXALT) 10 MG tablet Take 10 mg by mouth as needed for migraine. May repeat in 2 hours if needed    [provider]  rosuvastatin (CRESTOR) 10 MG tablet Take 10 mg by mouth daily.    [provider]  sertraline (ZOLOFT) 50 MG tablet Take 100 mg by mouth every morning.  02/27/14   [provider]    Physical Exam: Vitals:   09/09/16 2230 09/09/16 2300 09/09/16 2335 09/09/16 2337   BP: (!) 171/75 (!) 166/76  (!) 145/85  Pulse: 78 74    Resp: (!) 27 20 20    Temp:    98.1 F (36.7 C)  TempSrc:   Oral Oral  SpO2: 96% 97%  100%  Weight:    113.7 kg (250 lb 11.2 oz)  Height:       General: Not in acute distress HEENT:       Eyes: PERRL, EOMI, no scleral icterus.       ENT: No discharge from the ears and nose, no pharynx injection, no tonsillar enlargement.  Neck: Difficult to assess JVD due to obesity, no bruit, no mass felt. Heme: No neck lymph node enlargement. Cardiac: B1/D1, RRR, 3/5 systolic murmurs, No gallops or rubs. Respiratory: has mild wheezing. No rales or rubs. GI: Soft, nondistended, nontender, no rebound pain, no organomegaly, BS present. GU: No hematuria Ext: 1+ pitting leg edema bilaterally. 2+DP/PT pulse bilaterally. Musculoskeletal: No joint deformities, No joint redness or warmth, no limitation of ROM in spin. Skin: No rashes.  Neuro: Alert, oriented X3, cranial nerves II-XII grossly intact, moves all extremities normally. Psych: Patient is not psychotic, no suicidal or hemocidal ideation.  Labs on Admission: I have personally reviewed following labs and imaging studies  CBC:  Recent Labs Lab 09/09/16 1731  WBC 7.3  HGB 10.7*  HCT 33.7*  MCV 95.2  PLT 761   Basic Metabolic Panel:  Recent Labs Lab 09/09/16 1731  NA 141  K 3.9  CL 110  CO2 23  GLUCOSE 264*  BUN 24*  CREATININE 0.87  CALCIUM 8.6*   GFR: Estimated Creatinine Clearance: 68.2 mL/min (by C-G formula based on SCr of 0.87 mg/dL). Liver Function Tests: No results for input(s): AST, ALT, ALKPHOS, BILITOT, PROT, ALBUMIN in the last 168 hours. No results for input(s): LIPASE, AMYLASE in the last 168 hours. No results for input(s): AMMONIA in the last 168 hours. Coagulation Profile: No results for input(s): INR, PROTIME in the last 168 hours. Cardiac Enzymes:  Recent Labs Lab 09/09/16 1731  TROPONINI 0.07*   BNP (last 3 results) No results for  input(s): PROBNP in the last 8760 hours. HbA1C: No results for input(s): HGBA1C in the last 72 hours. CBG:  Recent Labs Lab 09/09/16 1739 09/09/16 2348  GLUCAP 258* 236*   Lipid Profile: No results for input(s): CHOL, HDL, LDLCALC, TRIG, CHOLHDL, LDLDIRECT in the last 72 hours. Thyroid Function Tests: No results for input(s): TSH, T4TOTAL, FREET4, T3FREE, THYROIDAB in the last 72 hours. Anemia Panel: No results for input(s): VITAMINB12, FOLATE, FERRITIN, TIBC, IRON, RETICCTPCT in the last 72 hours. Urine analysis:    Component Value Date/Time   COLORURINE YELLOW 08/28/2014 [place] 08/28/2014 1555   LABSPEC 1.011 08/28/2014 1555   PHURINE 6.0 08/28/2014 1555   GLUCOSEU NEGATIVE 08/28/2014 1555   HGBUR NEGATIVE 08/28/2014 1555   BILIRUBINUR NEGATIVE 08/28/2014 1555   KETONESUR NEGATIVE 08/28/2014 1555   PROTEINUR NEGATIVE 08/28/2014 1555   UROBILINOGEN 0.2 08/28/2014 1555   NITRITE NEGATIVE 08/28/2014 [place] 08/28/2014 1555   Sepsis Labs: @LABRCNTIP (procalcitonin:4,lacticidven:4) )No results found for this or any previous visit (from the past 240 hour(s)).   Radiological Exams on Admission: Dg Chest 2 View  Result Date: 09/09/2016 CLINICAL DATA:  Dyspnea and shortness of breath EXAM: CHEST  2 VIEW COMPARISON:  10/02/2015 FINDINGS: Low lung volumes. No focal consolidation or effusion. Borderline cardiomegaly. Aortic atherosclerosis. No pneumothorax. Degenerative changes of the spine IMPRESSION: 1. Low lung volume without acute infiltrate 2. Borderline cardiomegaly, likely augmented by low lung volume Electronically Signed   By: Donavan Foil M.D.   On: 09/09/2016 18:15     EKG: Independently reviewed.  Sinus rhythm, QTC 447, T-wave inversion in lateral leads.  Assessment/Plan Principal Problem:   Acute on chronic diastolic CHF (congestive heart failure) (HCC) Active Problems:   Depression   Essential hypertension   Diabetes  mellitus without complication (HCC)   Seizure disorder (HCC)   Anxiety   Dyslipidemia   Elevated troponin   Acute on chronic diastolic CHF (congestive heart  failure) [facility]): Patient has shortness of breath, increased body weight, leg edema, elevated BNP, consistent with CHF exacerbation. 2-D echo 08/29/14 showed EF 55-60% with grade 2 diastolic dysfunction.  -will admit to tele bed as inpt. -Lasix 40 mg bid by IV -trop x 3 -2d echo -will continue home metoprolol, ASA -start lisinopril 5 mg daily -Daily weights -strict I/O's -Low salt diet  Elevated troponin and hx of CAD: s/p of stent. Troponin 0.07, but no chest pain. Likely due to demand ischemia secondary to CHF exacerbation - cycle CE q6 x3 and repeat EKG in the am  - prm Nitroglycerin, and aspirin, Crestor, imdur, metoprolol - pt is on effient - Risk factor stratification: will check FLP and A1C  - 2d echo  Depression and anxiety: Stable, no suicidal or homicidal ideations. -Continue home medications: Ativan, Zoloft  Essential hypertension: -Continue metoprolol -IVF hydralazine when necessary  HLD: -crestor  Diabetes mellitus without complication ([place]): Last A1c 8.1 on 07/15/16, poorly controled. Patient is taking at Levemir and NovoLog home -will decrease Levemir dose from 10-7 units daily  -SSI  Seizure disorder [facility]): -Seizure precaution -Continue Keppra   DVT ppx: sQ Lovenox Code Status:  Partial code (I discussed with the patient in the presence of her daughter, and explained the meaning of CODE STATUS, patient wants to be partial code, OK for CPR, but no intubation).  Family Communication:   Yes, patient's daughter   at bed side Disposition Plan:  Anticipate discharge back to previous home environment Consults called:  none Admission status:  Inpatient/tele       Date of Service 09/10/2016    Ivor Costa [facility] Pager [phone]  If 7PM-7AM, please contact  night-coverage [email or web address] Password [email or web address] 09/10/2016, 1:09 AM

## 2016-09-09 NOTE — ED Provider Notes (Signed)
[facility] DEPT Provider Note   CSN: [id number] Arrival date & time: 09/09/16  1711  History   Chief Complaint Chief Complaint  Patient presents with  . Shortness of Breath    HPI [name] is a 71 y.o. female.  This is a 71 year old obese female with PMH of CAD status post 5 stent placement, T2 DM on insulin, HLD, HTN who presents with progressive shortness of breath that has been intermittently worsening the past several months.  Patient and daughter state she acutely worsened yesterday while at home watching TV on her couch.  She denies any worsening leg swelling, she does not smoke, no history of inherent lung disease such as COPD or asthma.  She denies any wheezing, fevers, productive cough, recent travel.  She lives at home with her husband.  She is not on dialysis. She denies any chest pain, she does not wear oxygen at home.   The history is provided by the patient, the spouse and medical records.    Past Medical History:  Diagnosis Date  . Anxiety   . Coronary artery disease   . Depression   . Diabetes mellitus    insulin dependent  . Edema of lower extremity   . Esophageal stricture 2008   EGD  . Fatigue   . GERD (gastroesophageal reflux disease)   . Heart murmur   . Hiatal hernia 2008   EGD  . Hyperlipidemia   . Hypertension   . Knee pain   . Morbid obesity ([place])   . Nonproductive cough    chronic  . Orthopnea   . Seizure disorder ([place])   . SOB (shortness of breath)   . Syncope and collapse   . Tremor     Patient Active Problem List   Diagnosis Date Noted  . Acute on chronic diastolic CHF (congestive heart failure) ([place]) 09/09/2016  . Elevated troponin 09/09/2016  . Migraine 08/20/2016  . Morbid obesity ([place]) 08/20/2016  . Anxiety 10/02/2015  . DOE (dyspnea on exertion) 10/02/2015  . Dyslipidemia 10/02/2015  . Nonrheumatic aortic valve stenosis 10/02/2015  . Old MI (myocardial infarction) 10/02/2015  . Seizure ([place]) 10/02/2015  . Insomnia  11/03/2014  . Weakness generalized 08/28/2014  . Seizure disorder ([place]) 08/28/2014  . Diabetes mellitus without complication ([place]) 29/79/8921  . Coronary artery disease of native artery of native heart with stable angina pectoris ([place]) 06/02/2010  . Depression 05/20/2007  . Essential hypertension 05/20/2007  . Diaphragmatic hernia 05/20/2007  . Diverticulosis of colon 05/20/2007  . IBS 05/20/2007  . Fatty liver 05/20/2007  . Hypothyroidism 05/20/2007  . Hx of transient ischemic attack (TIA) 05/20/2007    Past Surgical History:  Procedure Laterality Date  . ABDOMINAL HYSTERECTOMY    . CARDIAC CATHETERIZATION  03/28/2009  . KNEE ARTHROSCOPY    . TRANSTHORACIC ECHOCARDIOGRAM     showed ef of 65% with no regional wall motion abnormalities    OB History    No data available       Home Medications    Prior to Admission medications   Medication Sig Start Date End Date Taking? Authorizing Provider  albuterol (PROVENTIL HFA;VENTOLIN HFA) 108 (90 Base) MCG/ACT inhaler Inhale into the lungs every 6 (six) hours as needed for wheezing or shortness of breath.    [provider]  ALPRAZolam Duanne Moron) 0.5 MG tablet Take 0.5 mg by mouth at bedtime as needed for anxiety.    [provider]  aspirin EC 325 MG EC tablet Take 1 tablet (325  mg total) by mouth daily. 09/02/14   Rai, Vernelle Emerald, MD  furosemide (LASIX) 20 MG tablet Take 20 mg by mouth.    [provider]  hydrOXYzine (ATARAX/VISTARIL) 10 MG tablet Take 1 tablet (10 mg total) by mouth 3 (three) times daily as needed for itching. 09/02/14   Rai, Ripudeep K, MD  insulin aspart (NOVOLOG) 100 UNIT/ML injection Inject 0-9 Units into the skin 3 (three) times daily with meals. Sliding scale CBG 70 - 120: 0 units CBG 121 - 150: 1 unit,  CBG 151 - 200: 2 units,  CBG 201 - 250: 3 units,  CBG 251 - 300: 5 units,  CBG 301 - 350: 7 units,  CBG 351 - 400: 9 units   CBG > 400: 9 units and notify your MD 09/02/14   Rai, Vernelle Emerald,  MD  insulin detemir (LEVEMIR) 100 UNIT/ML injection Inject 10 Units into the skin at bedtime.    [provider]  Insulin Syringe-Needle U-100 31G X 15/64" 0.5 ML MISC 1 Device by Does not apply route 3 (three) times daily with meals. 11/07/13   Renato Shin, MD  isosorbide mononitrate (IMDUR) 30 MG 24 hr tablet Take 30 mg by mouth daily.    [provider]  levETIRAcetam (KEPPRA) 1000 MG tablet Take 1 tablet (1,000 mg total) by mouth 2 (two) times daily. 08/30/16   Briscoe Deutscher, DO  LORazepam (ATIVAN) 0.5 MG tablet Take 1 tablet (0.5 mg total) by mouth 2 (two) times daily. 08/16/16   Briscoe Deutscher, DO  metoprolol (LOPRESSOR) 50 MG tablet Take 50 mg by mouth 2 (two) times daily.  07/02/14   [provider]  nitroGLYCERIN (NITROSTAT) 0.4 MG SL tablet Place 0.4 mg under the tongue every 5 (five) minutes as needed for chest pain.    [provider]  prasugrel (EFFIENT) 10 MG TABS tablet Take 10 mg by mouth daily.    [provider]  rizatriptan (MAXALT) 10 MG tablet Take 10 mg by mouth as needed for migraine. May repeat in 2 hours if needed    [provider]  rosuvastatin (CRESTOR) 10 MG tablet Take 10 mg by mouth daily.    [provider]  sertraline (ZOLOFT) 50 MG tablet Take 100 mg by mouth every morning.  02/27/14   [provider]    Family History Family History  Problem Relation Age of Onset  . Heart attack Mother   . Hypertension Mother   . COPD Father   . Heart disease Sister   . Diabetes Brother   . Diabetes Brother   . Prostate cancer Brother   . Kidney disease Brother     Social History Social History  Substance Use Topics  . Smoking status: Former Smoker    Quit date: 06/02/1970  . Smokeless tobacco: Never Used  . Alcohol use No     Allergies   Codeine   Review of Systems Review of Systems  Constitutional: Negative for chills, diaphoresis and fever.  HENT: Negative for ear pain and sore  throat.   Eyes: Negative for pain and visual disturbance.  Respiratory: Positive for cough and shortness of breath. Negative for choking, chest tightness and wheezing.   Cardiovascular: Positive for leg swelling. Negative for chest pain and palpitations.  Gastrointestinal: Negative for abdominal pain, blood in stool, constipation, diarrhea and vomiting.  Genitourinary: Negative for decreased urine volume, difficulty urinating, dysuria, flank pain, frequency and hematuria.  Musculoskeletal: Positive for gait problem. Negative for arthralgias, back  pain and joint swelling.  Skin: Negative for color change and rash.  Neurological: Negative for tremors, seizures, syncope, speech difficulty, weakness, numbness and headaches.  Psychiatric/Behavioral: Negative for confusion.  All other systems reviewed and are negative.    Physical Exam Updated Vital Signs BP (!) 145/85 (BP Location: Right Arm)   Pulse 74   Temp 98.1 F (36.7 C) (Oral)   Resp 20   Ht 5' (1.524 m)   Wt 113.7 kg (250 lb 11.2 oz)   SpO2 100%   BMI 48.96 kg/m   Physical Exam  Constitutional: No distress.  HENT:  Head: Normocephalic and atraumatic.  Eyes: Pupils are equal, round, and reactive to light. Conjunctivae are normal.  Neck: Neck supple.  Cardiovascular: Normal rate and regular rhythm.   No murmur heard. Pulmonary/Chest: Effort normal and breath sounds normal. No accessory muscle usage. No respiratory distress.  Abdominal: Soft. There is no tenderness.  Musculoskeletal: She exhibits no edema.  Neurological: She is alert.  Skin: Skin is warm and dry. She is not diaphoretic.  Psychiatric: She has a normal mood and affect.  Nursing note and vitals reviewed.  ED Treatments / Results  Labs (all labs ordered are listed, but only abnormal results are displayed) Labs Reviewed  TROPONIN I - Abnormal; Notable for the following:       Result Value   Troponin I 0.07 (*)    All other components within normal limits    BRAIN NATRIURETIC PEPTIDE - Abnormal; Notable for the following:    B Natriuretic Peptide 847.5 (*)    All other components within normal limits  CBC - Abnormal; Notable for the following:    RBC 3.54 (*)    Hemoglobin 10.7 (*)    HCT 33.7 (*)    All other components within normal limits  BASIC METABOLIC PANEL - Abnormal; Notable for the following:    Glucose, Bld 264 (*)    BUN 24 (*)    Calcium 8.6 (*)    All other components within normal limits  GLUCOSE, CAPILLARY - Abnormal; Notable for the following:    Glucose-Capillary 236 (*)    All other components within normal limits  CBG MONITORING, ED - Abnormal; Notable for the following:    Glucose-Capillary 258 (*)    All other components within normal limits  HEMOGLOBIN A1C  LIPID PANEL  TROPONIN I  TROPONIN I  TROPONIN I  BASIC METABOLIC PANEL    EKG  EKG Interpretation  Date/Time:  Thursday September 09 2016 17:44:28 EDT Ventricular Rate:  76 PR Interval:    QRS Duration: 98 QT Interval:  397 QTC Calculation: 447 R Axis:   38 Text Interpretation:  Sinus rhythm Repol abnrm suggests ischemia, anterolateral new TWI in V5 and V6 No STEMI Confirmed by Addison Lank [phone]) on 09/09/2016 5:58:10 PM      Radiology Dg Chest 2 View  Result Date: 09/09/2016 CLINICAL DATA:  Dyspnea and shortness of breath EXAM: CHEST  2 VIEW COMPARISON:  10/02/2015 FINDINGS: Low lung volumes. No focal consolidation or effusion. Borderline cardiomegaly. Aortic atherosclerosis. No pneumothorax. Degenerative changes of the spine IMPRESSION: 1. Low lung volume without acute infiltrate 2. Borderline cardiomegaly, likely augmented by low lung volume Electronically Signed   By: Donavan Foil M.D.   On: 09/09/2016 18:15    Procedures Procedures (including critical care time)  Medications Ordered in ED Medications  albuterol (PROVENTIL) (2.5 MG/3ML) 0.083% nebulizer solution 5 mg (not administered)  levETIRAcetam (KEPPRA) tablet 1,000 mg (1,000 mg  Oral Given 09/09/16 2311)  LORazepam (ATIVAN) tablet 0.5 mg (0.5 mg Oral Not Given 09/09/16 2238)  nitroGLYCERIN (NITROSTAT) SL tablet 0.4 mg (not administered)  SUMAtriptan (IMITREX) tablet 50 mg (not administered)  rosuvastatin (CRESTOR) tablet 10 mg (not administered)  insulin detemir (LEVEMIR) injection 7 Units (7 Units Subcutaneous Given 09/10/16 0033)  isosorbide mononitrate (IMDUR) 24 hr tablet 30 mg (not administered)  aspirin EC tablet 325 mg (not administered)  hydrOXYzine (ATARAX/VISTARIL) tablet 10 mg (not administered)  metoprolol tartrate (LOPRESSOR) tablet 50 mg (50 mg Oral Given 09/09/16 2311)  sertraline (ZOLOFT) tablet 100 mg (not administered)  prasugrel (EFFIENT) tablet 10 mg (not administered)  furosemide (LASIX) injection 40 mg (40 mg Intravenous Given 09/09/16 2246)  insulin aspart (novoLOG) injection 0-9 Units (not administered)  sodium chloride flush (NS) 0.9 % injection 3 mL (3 mLs Intravenous Given 09/09/16 2350)  sodium chloride flush (NS) 0.9 % injection 3 mL (not administered)  0.9 %  sodium chloride infusion (not administered)  acetaminophen (TYLENOL) tablet 650 mg (650 mg Oral Given 09/09/16 2311)  ondansetron (ZOFRAN) injection 4 mg (not administered)  enoxaparin (LOVENOX) injection 40 mg (not administered)  lisinopril (PRINIVIL,ZESTRIL) tablet 5 mg (not administered)  ALPRAZolam (XANAX) tablet 1 mg (not administered)  hydrALAZINE (APRESOLINE) injection 5 mg (not administered)  furosemide (LASIX) 10 MG/ML injection (  Not Given 09/09/16 2357)  albuterol (PROVENTIL) (2.5 MG/3ML) 0.083% nebulizer solution 5 mg (5 mg Nebulization Given 09/09/16 1812)  LORazepam (ATIVAN) tablet 1 mg (1 mg Oral Given 09/09/16 2218)   Initial Impression / Assessment and Plan / ED Course  I have reviewed the triage vital signs and the nursing notes.  Pertinent labs & imaging results that were available during my care of the patient were reviewed by me and considered in my medical decision making  (see chart for details).     This is a 71 year old obese female with PMH of CAD status post 5 stent placement, T2 DM on insulin, HLD, HTN who presents with progressive shortness of breath that has been intermittently worsening the past several months.  Medical record reviewed.  Last echo performed 2016 showed EF of 55-60%.  CBC, BMP, chest x-ray, troponins, EKG performed. EKG shows sinus rhythm with T-wave inversions in the lateral leads, V5-V6.  Compared to EKG 2 years prior this is a new finding although patient has extensive CAD history.  Hear score cannot be completed due to patient having CAD. No stress echo on file.  Patient's troponin found to be elevated at 0.07.  BNP 847.5 with no prior values to compare to.  Patient has no leukocytosis noted on laboratory studies.  Her creatinine is unremarkable at 0.87.  While in the ED patient given nitroglycerin and aspirin. She was also given Lasix. Her chest x-ray did not show any acute cardiopulmonary abnormality.  Medicine team consulted for admission for troponin trending and likely stress echocardiography.  Patient and daughter agreed on admission. All questions answered.   Final Clinical Impressions(s) / ED Diagnoses   Final diagnoses:  Shortness of breath  Chest pain due to myocardial ischemia, unspecified ischemic chest pain type    New Prescriptions Current Discharge Medication List       Aldona Lento, MD 09/10/16 [place]isco[place], [place], MD 09/10/16 [phone]

## 2016-09-09 NOTE — ED Notes (Signed)
Pt eating and drinking okayed by [employer] EDP

## 2016-09-09 NOTE — ED Provider Notes (Addendum)
I have personally seen and examined the patient. I have reviewed the documentation on PMH/FH/Soc Hx. I have discussed the plan of care with the resident and patient.  I have reviewed and agree with the resident's documentation. Please see associated encounter note.  Briefly the patient is a 71 year old female with a history of anxiety, CAD with prior MI status post multiple stents, congestive heart failure who presents to the emergency department for progressively worsening shortness of breath over the past several weeks that is been more persistent over the past several days. Workup consistent with CHF exacerbation. EKG did reveal new T-wave inversions and patient does have elevated troponin, concerning for ACS versus demand ischemia. Patient given aspirin. Admitted for further workup and management.   EKG Interpretation  Date/Time:  Thursday September 09 2016 17:44:28 EDT Ventricular Rate:  76 PR Interval:    QRS Duration: 98 QT Interval:  397 QTC Calculation: 447 R Axis:   38 Text Interpretation:  Sinus rhythm Repol abnrm suggests ischemia, anterolateral new TWI in V5 and V6 No STEMI Confirmed by Addison Lank [phone]) on 09/09/2016 5:58:10 PM      CRITICAL CARE Performed by: Grayce Sessions Alegandra Sommers Total critical care time: 30 minutes Critical care time was exclusive of separately billable procedures and treating other patients. Critical care was necessary to treat or prevent imminent or life-threatening deterioration. Critical care was time spent personally by me on the following activities: development of treatment plan with patient and/or surrogate as well as nursing, discussions with consultants, evaluation of patient's response to treatment, examination of patient, obtaining history from patient or surrogate, ordering and performing treatments and interventions, ordering and review of laboratory studies, ordering and review of radiographic studies, pulse oximetry and re-evaluation of  patient's condition.     Fatima Blank, MD 09/10/16 [phone]

## 2016-09-09 NOTE — ED Notes (Signed)
Pt is asking where the doctor is because she is ready to go, made aware that the EDP mentioned that she will need to be admitted to the hospital.  Pt is adamant that she goes home, states that she is not staying d/t what happened to her 7 years ago.  Asked pt why she came to the ED seeking help if she is refusing to follow through with plan of care, she states that she was panicking because she cannot breathe.  Pt's daughter is concerned about pt's well being because she is not active at home, just lays in bed.  Pt reports she has not taken her "water pill" x 2 mornings.  Pt's breathing remains the same, audible wheezing and labored breathing noted.

## 2016-09-09 NOTE — Progress Notes (Deleted)
Subjective:   [name] is a 71 y.o. female who presents for an Initial Medicare Annual Wellness Visit.  Review of Systems    No ROS.  Medicare Wellness Visit. Additional risk factors are reflected in the social history.         Objective:    There were no vitals filed for this visit. There is no height or weight on file to calculate BMI.   Current Medications (verified) Outpatient Encounter Prescriptions as of 09/15/2016  Medication Sig  . albuterol (PROVENTIL HFA;VENTOLIN HFA) 108 (90 Base) MCG/ACT inhaler Inhale into the lungs every 6 (six) hours as needed for wheezing or shortness of breath.  . ALPRAZolam (XANAX) 0.5 MG tablet Take 0.5 mg by mouth at bedtime as needed for anxiety.  Marland Kitchen aspirin EC 325 MG EC tablet Take 1 tablet (325 mg total) by mouth daily.  . furosemide (LASIX) 20 MG tablet Take 20 mg by mouth.  . hydrOXYzine (ATARAX/VISTARIL) 10 MG tablet Take 1 tablet (10 mg total) by mouth 3 (three) times daily as needed for itching.  . insulin aspart (NOVOLOG) 100 UNIT/ML injection Inject 0-9 Units into the skin 3 (three) times daily with meals. Sliding scale CBG 70 - 120: 0 units CBG 121 - 150: 1 unit,  CBG 151 - 200: 2 units,  CBG 201 - 250: 3 units,  CBG 251 - 300: 5 units,  CBG 301 - 350: 7 units,  CBG 351 - 400: 9 units   CBG > 400: 9 units and notify your MD  . insulin detemir (LEVEMIR) 100 UNIT/ML injection Inject 10 Units into the skin at bedtime.  . Insulin Syringe-Needle U-100 31G X 15/64" 0.5 ML MISC 1 Device by Does not apply route 3 (three) times daily with meals.  . isosorbide mononitrate (IMDUR) 30 MG 24 hr tablet Take 30 mg by mouth daily.  Marland Kitchen levETIRAcetam (KEPPRA) 1000 MG tablet Take 1 tablet (1,000 mg total) by mouth 2 (two) times daily.  Marland Kitchen LORazepam (ATIVAN) 0.5 MG tablet Take 1 tablet (0.5 mg total) by mouth 2 (two) times daily.  . metoprolol (LOPRESSOR) 50 MG tablet Take 50 mg by mouth 2 (two) times daily.   . nitroGLYCERIN (NITROSTAT) 0.4 MG SL  tablet Place 0.4 mg under the tongue every 5 (five) minutes as needed for chest pain.  . prasugrel (EFFIENT) 10 MG TABS tablet Take 10 mg by mouth daily.  . rizatriptan (MAXALT) 10 MG tablet Take 10 mg by mouth as needed for migraine. May repeat in 2 hours if needed  . rosuvastatin (CRESTOR) 10 MG tablet Take 10 mg by mouth daily.  . sertraline (ZOLOFT) 50 MG tablet Take 100 mg by mouth every morning.    No facility-administered encounter medications on file as of 09/15/2016.     Allergies (verified) Codeine   History: Past Medical History:  Diagnosis Date  . Anxiety   . Coronary artery disease   . Depression   . Diabetes mellitus    insulin dependent  . Edema of lower extremity   . Esophageal stricture 2008   EGD  . Fatigue   . GERD (gastroesophageal reflux disease)   . Heart murmur   . Hiatal hernia 2008   EGD  . Hyperlipidemia   . Hypertension   . Knee pain   . Morbid obesity ([place])   . Nonproductive cough    chronic  . Orthopnea   . Seizure disorder ([place])   . SOB (shortness of breath)   . Syncope  and collapse   . Tremor    Past Surgical History:  Procedure Laterality Date  . ABDOMINAL HYSTERECTOMY    . CARDIAC CATHETERIZATION  03/28/2009  . KNEE ARTHROSCOPY    . TRANSTHORACIC ECHOCARDIOGRAM     showed ef of 65% with no regional wall motion abnormalities   Family History  Problem Relation Age of Onset  . Heart attack Mother   . Hypertension Mother   . COPD Father   . Heart disease Sister   . Diabetes Brother   . Diabetes Brother   . Prostate cancer Brother   . Kidney disease Brother    Social History   Occupational History  . Retired    Social History Main Topics  . Smoking status: Former Smoker    Quit date: 06/02/1970  . Smokeless tobacco: Never Used  . Alcohol use No  . Drug use: No  . Sexual activity: Not on file    Tobacco Counseling Counseling given: Not Answered   Activities of Daily Living No flowsheet data found.  Immunizations  and Health Maintenance Immunization History  Administered Date(s) Administered  . Influenza, Seasonal, Injecte, Preservative Fre 11/03/2014  . Influenza,inj,Quad PF,6+ Mos 08/30/2014, 10/04/2015   Health Maintenance Due  Topic Date Due  . Hepatitis C Screening  [birth year]  . OPHTHALMOLOGY EXAM  04/20/1955  . URINE MICROALBUMIN  04/20/1955  . TETANUS/TDAP  04/19/1964  . MAMMOGRAM  04/20/1995  . DEXA SCAN  04/20/2010  . PNA vac Low Risk Adult (1 of 2 - PCV13) 04/20/2010  . COLONOSCOPY  04/26/2016  . INFLUENZA VACCINE  08/04/2016    Patient Care Team: Briscoe Deutscher, DO as PCP - General (Family Medicine) Everardo Beals, NP as Nurse Practitioner  Indicate any recent Medical Services you may have received from other than [facility] providers in the past year (date may be approximate).     Assessment:   This is a routine wellness examination for [name]. Physical assessment deferred to PCP.   Hearing/Vision screen No exam data present  Dietary issues and exercise activities discussed:    Goals    None     Depression Screen No flowsheet data found.  Fall Risk No flowsheet data found.  Cognitive Function:        Screening Tests Health Maintenance  Topic Date Due  . Hepatitis C Screening  02/28/1945  . OPHTHALMOLOGY EXAM  04/20/1955  . URINE MICROALBUMIN  04/20/1955  . TETANUS/TDAP  04/19/1964  . MAMMOGRAM  04/20/1995  . DEXA SCAN  04/20/2010  . PNA vac Low Risk Adult (1 of 2 - PCV13) 04/20/2010  . COLONOSCOPY  04/26/2016  . INFLUENZA VACCINE  08/04/2016  . HEMOGLOBIN A1C  01/15/2017  . FOOT EXAM  07/15/2017      Plan:   Follow up with PCP as directed.  I have personally reviewed and noted the following in the patient's chart:   . Medical and social history . Use of alcohol, tobacco or illicit drugs  . Current medications and supplements . Functional ability and status . Nutritional status . Physical activity . Advanced directives . List of other  physicians . Vitals . Screenings to include cognitive, depression, and falls . Referrals and appointments  In addition, I have reviewed and discussed with patient certain preventive protocols, quality metrics, and best practice recommendations. A written personalized care plan for preventive services as well as general preventive health recommendations were provided to patient.     Ree Edman, RN   09/09/2016

## 2016-09-09 NOTE — Progress Notes (Deleted)
PCP notes:   Health maintenance: Hep C Screening Opthamology Urine Microalbumin Tdap Mammogram [place] PCV13 Colonoscopy   Abnormal screenings:    Patient concerns:    Nurse concerns:   Next PCP appt: 09/15/16 10:00

## 2016-09-09 NOTE — Progress Notes (Deleted)
Pre visit review using our clinic review tool, if applicable. No additional management support is needed unless otherwise documented below in the visit note. 

## 2016-09-09 NOTE — ED Triage Notes (Addendum)
Per [facility]: Pt to ED from home c/o SOB x 3 weeks, worsening today. Patient has significant cardiac hx (stents, MI, CHF), denies CP, denies dizziness/N/V, no fevers/chills. Per EMS, pt was 95-96% on RA, placed on 3lpm O2 and patient stated that she "felt much better with the oxygen." Resp e/u, skin warm/dry. EMS VS: P 76 regular, strong, 168/90, CBG 242. Pt A&O x 4.

## 2016-09-09 NOTE — ED Notes (Addendum)
Niu MD states pt does not need troponin now.

## 2016-09-09 NOTE — ED Notes (Signed)
[place] EDP notified and is at bedside

## 2016-09-09 NOTE — ED Notes (Signed)
Pt got OOB with one assist to use the [facility].  Pt per daughter got back in bed by herself.  Dyspnea noted with exertion.

## 2016-09-09 NOTE — ED Notes (Signed)
Critical trop of 0.07 called in by lab.  [place] notified

## 2016-09-10 ENCOUNTER — Inpatient Hospital Stay (HOSPITAL_COMMUNITY): Payer: Medicare Other

## 2016-09-10 ENCOUNTER — Ambulatory Visit: Payer: Medicare Other | Admitting: Endocrinology

## 2016-09-10 ENCOUNTER — Other Ambulatory Visit: Payer: Self-pay

## 2016-09-10 DIAGNOSIS — I351 Nonrheumatic aortic (valve) insufficiency: Secondary | ICD-10-CM

## 2016-09-10 LAB — BASIC METABOLIC PANEL
Anion gap: 8 (ref 5–15)
BUN: 24 mg/dL — ABNORMAL HIGH (ref 6–20)
CALCIUM: 8.5 mg/dL — AB (ref 8.9–10.3)
CHLORIDE: 107 mmol/L (ref 101–111)
CO2: 25 mmol/L (ref 22–32)
CREATININE: 0.95 mg/dL (ref 0.44–1.00)
GFR calc non Af Amer: 59 mL/min — ABNORMAL LOW (ref >60)
Glucose, Bld: 238 mg/dL — ABNORMAL HIGH (ref 65–99)
Potassium: 3.9 mmol/L (ref 3.5–5.1)
SODIUM: 140 mmol/L (ref 135–145)

## 2016-09-10 LAB — GLUCOSE, CAPILLARY
Glucose-Capillary: 144 mg/dL — ABNORMAL HIGH (ref 65–99)
Glucose-Capillary: 140 mg/dL — ABNORMAL HIGH (ref 65–99)
Glucose-Capillary: 185 mg/dL — ABNORMAL HIGH (ref 65–99)
Glucose-Capillary: 223 mg/dL — ABNORMAL HIGH (ref 65–99)

## 2016-09-10 LAB — TROPONIN I
TROPONIN I: 0.07 ng/mL — AB (ref <0.03)
Troponin I: 0.07 ng/mL (ref <0.03)

## 2016-09-10 LAB — LIPID PANEL
CHOL/HDL RATIO: 4.2 ratio
Cholesterol: 172 mg/dL (ref 0–200)
HDL: 41 mg/dL (ref >40)
LDL Cholesterol: 113 mg/dL — ABNORMAL HIGH (ref 0–99)
TRIGLYCERIDES: 91 mg/dL (ref <150)
VLDL: 18 mg/dL (ref 0–40)

## 2016-09-10 LAB — ECHOCARDIOGRAM COMPLETE
HEIGHTINCHES: 60 in
WEIGHTICAEL: 4011.2 [oz_av]

## 2016-09-10 LAB — HEMOGLOBIN A1C
HEMOGLOBIN A1C: 8 % — AB (ref 4.8–5.6)
MEAN PLASMA GLUCOSE: 182.9 mg/dL

## 2016-09-10 NOTE — Progress Notes (Signed)
PROGRESS NOTE  [name] BTD:[id number] DOB: Apr 12, 1945 DOA: 09/09/2016 PCP: Briscoe Deutscher, DO  HPI/Recap of past 24 hours:  Feeling better, denies pain, daughter at bedside  Assessment/Plan: Principal Problem:   Acute on chronic diastolic CHF (congestive heart failure) ([place]) Active Problems:   Depression   Essential hypertension   Diabetes mellitus without complication (HCC)   Seizure disorder ([place])   Anxiety   Dyslipidemia   Elevated troponin  Acute on chronic diastolic chf:  -Patient presented with feeling sob for three weeks, denies chest pain -Troponin mild and flat, ekg dose has st/t flattening/depression but seems to be chronica when compared to prior study.  -echopending -she is started on iv lasix, close monitor  Cr/volume status  CAD s/p multiple stents with stent restenosis with h/o aortic valve stenosis/regurg  she was evaluated at [facility] thought to be hight risk for CABG/AVR, but recommended TAVR, it dose not appear patient has followed up since. 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  Morbid obesity: Body mass index is 48.96 kg/m.   FTT: patient has been home bound for several years due to [place]. Pt eval, patient states she will never go to snf,  She is open to home health. Case manager consulted.  Code Status: partial  Family Communication: patient and daughter at bedside  Disposition Plan: home likely will need home health   Consultants:  none  Procedures:  none  Antibiotics:  none   Objective: BP 127/73 (BP Location: Left Arm)   Pulse 64   Temp 97.7 F (36.5 C) (Oral)   Resp 20   Ht 5' (1.524 m)   Wt 113.7 kg (250 lb 11.2 oz)   SpO2 100%   BMI 48.96 kg/m   Intake/Output Summary (Last 24 hours) at 09/10/16 0839 Last data filed at 09/10/16 0500  Gross per 24 hour    Intake              100 ml  Output              726 ml  Net             -626 ml   Filed Weights   09/09/16 2200 09/09/16 2337  Weight: 108.4 kg (239 lb) 113.7 kg (250 lb 11.2 oz)    Exam: Patient is examined daily including today on 09/10/2016, exams remain the same as of yesterday except that has changed    General:  Frail, chronically ill appearing, obese, but NAD, alert , oriented x3.  Cardiovascular: RRR  Respiratory: diminished, previously documented wheezing has resolved  Abdomen: Soft/ND/NT, positive BS  Musculoskeletal: previously documented Edema has resolved  Neuro: alert, oriented   Data Reviewed: Basic Metabolic Panel:  Recent Labs Lab 09/09/16 1731 09/10/16 0051  NA 141 140  K 3.9 3.9  CL 110 107  CO2 23 25  GLUCOSE 264* 238*  BUN 24* 24*  CREATININE 0.87 0.95  CALCIUM 8.6* 8.5*   Liver Function Tests: No results for input(s): AST, ALT, ALKPHOS, BILITOT, PROT, ALBUMIN in the last 168 hours. No results for input(s): LIPASE, AMYLASE in the last 168 hours. No results for input(s): AMMONIA in the last 168 hours. CBC:  Recent Labs Lab 09/09/16 1731  WBC 7.3  HGB 10.7*  HCT 33.7*  MCV 95.2  PLT 230   Cardiac Enzymes:    Recent Labs Lab 09/09/16 1731 09/10/16 0051  TROPONINI  0.07* 0.07*   BNP (last 3 results)  Recent Labs  09/09/16 1731  BNP 847.5*    ProBNP (last 3 results) No results for input(s): PROBNP in the last 8760 hours.  CBG:  Recent Labs Lab 09/09/16 1739 09/09/16 2348 09/10/16 0734  GLUCAP 258* 236* 144*    No results found for this or any previous visit (from the past 240 hour(s)).   Studies: Dg Chest 2 View  Result Date: 09/09/2016 CLINICAL DATA:  Dyspnea and shortness of breath EXAM: CHEST  2 VIEW COMPARISON:  10/02/2015 FINDINGS: Low lung volumes. No focal consolidation or effusion. Borderline cardiomegaly. Aortic atherosclerosis. No pneumothorax. Degenerative changes of the spine IMPRESSION: 1. Low lung  volume without acute infiltrate 2. Borderline cardiomegaly, likely augmented by low lung volume Electronically Signed   By: Donavan Foil M.D.   On: 09/09/2016 18:15    Scheduled Meds: . aspirin  325 mg Oral Daily  . enoxaparin (LOVENOX) injection  40 mg Subcutaneous Q24H  . furosemide      . furosemide  40 mg Intravenous Q12H  . insulin aspart  0-9 Units Subcutaneous TID WC  . insulin detemir  7 Units Subcutaneous QHS  . isosorbide mononitrate  30 mg Oral Daily  . levETIRAcetam  1,000 mg Oral BID  . lisinopril  5 mg Oral Daily  . LORazepam  0.5 mg Oral BID  . metoprolol tartrate  50 mg Oral BID  . prasugrel  10 mg Oral Daily  . rosuvastatin  10 mg Oral Daily  . sertraline  100 mg Oral q morning - 10a  . sodium chloride flush  3 mL Intravenous Q12H    Continuous Infusions: . sodium chloride       Time spent: 35 mins I have personally reviewed and interpreted on  09/10/2016 daily labs, tele strips, imagings as discussed above under data review session and assessment and plans.  I reviewed all nursing notes,  vitals, pertinent old records  I have discussed plan of care as described above with RN , patient and family on 09/10/2016   Zaida Reiland MD, PhD  [facility] Pager [phone]. If 7PM-7AM, please contact night-coverage at [email or web address], password [facility] 09/10/2016, 8:39 AM  LOS: 1 day

## 2016-09-10 NOTE — Progress Notes (Signed)
  Echocardiogram 2D Echocardiogram has been performed.  [name] 09/10/2016, 3:00 PM

## 2016-09-11 DIAGNOSIS — I5033 Acute on chronic diastolic (congestive) heart failure: Secondary | ICD-10-CM

## 2016-09-11 LAB — BASIC METABOLIC PANEL
ANION GAP: 9 (ref 5–15)
BUN: 30 mg/dL — ABNORMAL HIGH (ref 6–20)
CALCIUM: 8.9 mg/dL (ref 8.9–10.3)
CHLORIDE: 106 mmol/L (ref 101–111)
CO2: 25 mmol/L (ref 22–32)
CREATININE: 1.12 mg/dL — AB (ref 0.44–1.00)
GFR calc non Af Amer: 48 mL/min — ABNORMAL LOW (ref >60)
GFR, EST AFRICAN AMERICAN: 56 mL/min — AB (ref >60)
GLUCOSE: 169 mg/dL — AB (ref 65–99)
Potassium: 4.8 mmol/L (ref 3.5–5.1)
Sodium: 140 mmol/L (ref 135–145)

## 2016-09-11 LAB — GLUCOSE, CAPILLARY
GLUCOSE-CAPILLARY: 176 mg/dL — AB (ref 65–99)
GLUCOSE-CAPILLARY: 210 mg/dL — AB (ref 65–99)
Glucose-Capillary: 180 mg/dL — ABNORMAL HIGH (ref 65–99)
Glucose-Capillary: 214 mg/dL — ABNORMAL HIGH (ref 65–99)

## 2016-09-11 LAB — MAGNESIUM: Magnesium: 1.7 mg/dL (ref 1.7–2.4)

## 2016-09-11 MED ORDER — FUROSEMIDE 10 MG/ML IJ SOLN
40.0000 mg | Freq: Every day | INTRAMUSCULAR | Status: DC
  Administered 2016-09-12: 40 mg via INTRAVENOUS
  Filled 2016-09-11: qty 4

## 2016-09-11 NOTE — Progress Notes (Addendum)
Patient ID: [name], female   DOB: Jan 07, 1945, 71 y.o.   MRN: [id number]   PROGRESS NOTE    [name]  QHU:[id number] DOB: 12-05-1945 DOA: 09/09/2016  PCP: Briscoe Deutscher, DO  Brief Narrative:  Pt is 71 yo female with known dCHF, HTN, HLD, DM, depression and seizures, CAD and s/p 5 stents placement. Pt presented with 3 weeks duration of progressively worsening dyspnea.   Assessment & Plan:   Acute on chronic diastolic CHF, elevated trop's - elevated trop likely related to acute dCHF - ECHO with stable EF 55 - 60 % and grade II diastolic CHF - pt has been started on lasix 40 mg IV BID - this is the weight trend in the past 71 hours Filed Weights   09/09/16 2200 09/09/16 2337 09/11/16 0540  Weight: 108.4 kg (239 lb) 113.7 kg (250 lb 11.2 oz) 111.6 kg (246 lb 1.6 oz)  - Cr is up a bit this AM but pt still with crackles at bases and LE edema - will plan on changing lasix to PO in AM but for now will change the frequency from BID to QD - BMP in AM  CAD s/p multiple stents with stent restenosis with h/o aortic valve stenosis/regurg - she was evaluated at [facility] thought to be hight risk for CABG/AVR, but recommended TAVR, it dose not appear patient has followed up since. - continue Aspirin, Imdur, Lisinopril, Metoprolol  - continue Prasugrel, statin  - no reports of chest pain this AM  Acute rise in Cr - likely from lasix - will repeat BMP in AM  HTN, essential  - reasonable inpatient control so far - contineu Metoprolol, Lisinopril, Metoprolol   Insulin dependent DM type II, A1C 8 - continue Insulin Detemir and SSI   H/o Seizure - no seizures since admission - continue Keppra   Morbid obesity: - Body mass index is 48.06 kg/m.  FFT - pt declining offer for SNF, PT consulted - also consulted CM  DVT prophylaxis: Lovenox SQ Code Status: PARTIAL CODE, no intubation but OK for CPR Family Communication: no family at bedside  Disposition Plan: to be determined but  likely home as pt declining SNF, HH orders placed, including oxygen and rolling walker   Consultants:   None  Procedures:   ECHO  Antimicrobials:   None  Subjective: Rather somnolent this am, can awake and answer quickly and then doses off.   Objective: Vitals:   09/10/16 1045 09/10/16 1956 09/11/16 0032 09/11/16 0540  BP: (!) 137/59 139/66 (!) 131/51 (!) 144/64  Pulse: 70 62 (!) 52 62  Resp:  18 18 20   Temp:  98 F (36.7 C) (!) 97.4 F (36.3 C) 97.8 F (36.6 C)  TempSrc:  Oral Oral Oral  SpO2:  96% 100% 98%  Weight:    111.6 kg (246 lb 1.6 oz)  Height:        Intake/Output Summary (Last 24 hours) at 09/11/16 1042 Last data filed at 09/11/16 0958  Gross per 24 hour  Intake              240 ml  Output             1100 ml  Net             -860 ml   Filed Weights   09/09/16 2200 09/09/16 2337 09/11/16 0540  Weight: 108.4 kg (239 lb) 113.7 kg (250 lb 11.2 oz) 111.6 kg (246 lb 1.6 oz)    Examination:  General exam: Appears somnolent but easy to awake  Respiratory system: mld exp wheezing and crackles at bases  Cardiovascular system: S1 & S2 heard, RRR. No JVD, rubs, gallops or clicks. +1 bilateral LE edema  Gastrointestinal system: Abdomen is nondistended, soft and nontender. No organomegaly or masses felt. Normal bowel sounds heard.  Data Reviewed: I have personally reviewed following labs and imaging studies  CBC:  Recent Labs Lab 09/09/16 1731  WBC 7.3  HGB 10.7*  HCT 33.7*  MCV 95.2  PLT 937   Basic Metabolic Panel:  Recent Labs Lab 09/09/16 1731 09/10/16 0051 09/11/16 0211  NA 141 140 140  K 3.9 3.9 4.8  CL 110 107 106  CO2 23 25 25   GLUCOSE 264* 238* 169*  BUN 24* 24* 30*  CREATININE 0.87 0.95 1.12*  CALCIUM 8.6* 8.5* 8.9  MG  --   --  1.7   Cardiac Enzymes:  Recent Labs Lab 09/09/16 1731 09/10/16 0051 09/10/16 1044  TROPONINI 0.07* 0.07* 0.07*   HbA1C:  Recent Labs  09/10/16 0051  HGBA1C 8.0*   CBG:  Recent  Labs Lab 09/10/16 0734 09/10/16 1118 09/10/16 1544 09/10/16 2113 09/11/16 0732  GLUCAP 144* 140* 185* 223* 176*   Lipid Profile:  Recent Labs  09/10/16 0051  CHOL 172  HDL 41  LDLCALC 113*  TRIG 91  CHOLHDL 4.2   Radiology Studies: Dg Chest 2 View  Result Date: 09/09/2016 CLINICAL DATA:  Dyspnea and shortness of breath EXAM: CHEST  2 VIEW COMPARISON:  10/02/2015 FINDINGS: Low lung volumes. No focal consolidation or effusion. Borderline cardiomegaly. Aortic atherosclerosis. No pneumothorax. Degenerative changes of the spine IMPRESSION: 1. Low lung volume without acute infiltrate 2. Borderline cardiomegaly, likely augmented by low lung volume Electronically Signed   By: Donavan Foil M.D.   On: 09/09/2016 18:15    Scheduled Meds: . aspirin  325 mg Oral Daily  . enoxaparin (LOVENOX) injection  40 mg Subcutaneous Q24H  . furosemide  40 mg Intravenous Q12H  . insulin aspart  0-9 Units Subcutaneous TID WC  . insulin detemir  7 Units Subcutaneous QHS  . isosorbide mononitrate  30 mg Oral Daily  . levETIRAcetam  1,000 mg Oral BID  . lisinopril  5 mg Oral Daily  . LORazepam  0.5 mg Oral BID  . metoprolol tartrate  50 mg Oral BID  . prasugrel  10 mg Oral Daily  . rosuvastatin  10 mg Oral Daily  . sertraline  100 mg Oral q morning - 10a  . sodium chloride flush  3 mL Intravenous Q12H   Continuous Infusions: . sodium chloride       LOS: 2 days   Time spent: 25 minutes  Greater than 50% of the time spent on counseling and coordinating the care.   Leisa Lenz, MD [facility] Pager [phone]  If 7PM-7AM, please contact night-coverage [email or web address] Password [facility] 09/11/2016, 10:42 AM

## 2016-09-11 NOTE — Progress Notes (Signed)
Patient was up with PT for ambulation in hallway and required oxygen.  Sat in chair for a couple of hours.  Reviewed information in Living Better with Heart Failure Booklet.

## 2016-09-11 NOTE — Progress Notes (Signed)
SATURATION QUALIFICATIONS: (This note is used to comply with regulatory documentation for home oxygen)  Patient Saturations on Room Air at Rest = 94%  Patient Saturations on Room Air while Ambulating = 86%  Patient Saturations on 2 Liters of oxygen post Ambulating = 97%  Please briefly explain why patient needs home oxygen: desaturation with mobility  Alben Deeds, PT DPT  Board Certified Neurologic Specialist [phone]

## 2016-09-11 NOTE — Evaluation (Signed)
Physical Therapy Evaluation Patient Details Name: [name] MRN: [id number] DOB: Aug 17, 1945 Today's Date: 09/11/2016   History of Present Illness  71 y.o. female with medical history significant of dCHF, hypertension, hyperlipidemia, diabetes mellitus, GERD, depression, anxiety, seizure, obesity, CAD, 5 stent placement, who presents with shortness breath (CHF exacerbation).  Clinical Impression  Orders received for PT evaluation. Patient demonstrates deficits in functional mobility as indicated below. Will benefit from continued skilled PT to address deficits and maximize function. Will see as indicated and progress as tolerated.  Patient required increased assist at this time, recommend HHPT upon discharge and supervision for mobility. DOE 3/4 during all aspects of mobility with audible wheezing present. OF NOTE: ambulated patient on room air with use of RW, saturations to 86% with HR mid 90s. Increased time to rebound >93% (3 minutes) supplemental O2 applied, returned to 97%.  Follow Up Recommendations Home health PT;Supervision/Assistance - 24 hour;Supervision for mobility/OOB    Equipment Recommendations   (states she has RW at home)    Recommendations for Other Services       Precautions / Restrictions Precautions Precautions: Fall Precaution Comments: watch O2 saturations      Mobility  Bed Mobility Overal bed mobility: Needs Assistance Bed Mobility: Supine to Sit     Supine to sit: Min guard;HOB elevated     General bed mobility comments: HOB elevated, increased time and effort to perform. Min guard for safety. Noted DOE with movement.  Transfers Overall transfer level: Needs assistance Equipment used: 1 person hand held assist Transfers: Sit to/from Stand Sit to Stand: Min assist         General transfer comment: min assist for stability during power up to stand with oppsing UE use for push off  Ambulation/Gait Ambulation/Gait assistance: Min guard;Min  assist (ocassional min assist for stability) Ambulation Distance (Feet): 100 Feet Assistive device: Rolling walker (2 wheeled) Gait Pattern/deviations: Step-through pattern;Decreased stride length;Shuffle;Trunk flexed;Wide base of support Gait velocity: decreased Gait velocity interpretation: Below normal speed for age/gender General Gait Details: patient with poor positioning during use of RW, VCs for safety and pacing. Min assist for stability intermittently (amb on RA, O2 saturations to 86 %, 3 min rebound)  Stairs            Wheelchair Mobility    Modified Rankin (Stroke Patients Only)       Balance Overall balance assessment: Needs assistance   Sitting balance-Leahy Scale: Fair     Standing balance support: During functional activity;No upper extremity supported Standing balance-Leahy Scale: Poor Standing balance comment: required reach out of UE to maintain stability                             Pertinent Vitals/Pain Pain Assessment: Faces Faces Pain Scale: Hurts little more Pain Location: LLE during mobility Pain Descriptors / Indicators: Aching;Discomfort Pain Intervention(s): Monitored during session    Home Living Family/patient expects to be discharged to:: Private residence Living Arrangements: Spouse/significant other Available Help at Discharge: Family Type of Home: House Home Access: Level entry     Home Layout: One level Home Equipment: Cane - single point;Wheelchair - [employer] - 2 wheels;Shower seat;Bedside commode Additional Comments: has 3 stairs to back patio    Prior Function Level of Independence: Independent               Hand Dominance        Extremity/Trunk Assessment   Upper Extremity Assessment Upper Extremity Assessment:  Generalized weakness    Lower Extremity Assessment Lower Extremity Assessment: Generalized weakness    Cervical / Trunk Assessment Cervical / Trunk Assessment:  (increased body  habitus)  Communication   Communication: No difficulties  Cognition Arousal/Alertness: Awake/alert Behavior During Therapy: Impulsive Overall Cognitive Status: Within Functional Limits for tasks assessed Area of Impairment: Memory;Safety/judgement                     Memory: Decreased short-term memory   Safety/Judgement: Decreased awareness of safety;Decreased awareness of deficits     General Comments: patient with poor insight and awareness of deficits and need for assist/use of device. Has recently had fall, does not recall details of event      General Comments General comments (skin integrity, edema, etc.): educated patient on energy conservation and safety with mobility and use of assitive device upon discharge. Patient demonstrates pursed lip breathing activity at EOB    Exercises     Assessment/Plan    PT Assessment Patient needs continued PT services  PT Problem List Decreased strength;Decreased activity tolerance;Decreased balance;Decreased mobility;Decreased safety awareness;Cardiopulmonary status limiting activity;Obesity;Pain       PT Treatment Interventions DME instruction;Gait training;Stair training;Functional mobility training;Therapeutic activities;Therapeutic exercise;Balance training;Neuromuscular re-education;Patient/family education    PT Goals (Current goals can be found in the Care Plan section)  Acute Rehab PT Goals Patient Stated Goal: to go home PT Goal Formulation: With patient Time For Goal Achievement: 09/25/16 Potential to Achieve Goals: Fair    Frequency Min 3X/week   Barriers to discharge        Co-evaluation               AM-PAC PT "6 Clicks" Daily Activity  Outcome Measure Difficulty turning over in bed (including adjusting bedclothes, sheets and blankets)?: A Lot Difficulty moving from lying on back to sitting on the side of the bed? : A Lot Difficulty sitting down on and standing up from a chair with arms (e.g.,  wheelchair, bedside commode, etc,.)?: Unable Help needed moving to and from a bed to chair (including a wheelchair)?: A Little Help needed walking in hospital room?: A Little Help needed climbing 3-5 steps with a railing? : A Lot 6 Click Score: 13    End of Session Equipment Utilized During Treatment: Gait belt;Oxygen Activity Tolerance: Patient limited by fatigue (noted DOE and audible wheezing with mobility) Patient left: in chair;with call bell/phone within reach;with chair alarm set;with family/visitor present Nurse Communication: Mobility status PT Visit Diagnosis: Difficulty in walking, not elsewhere classified (R26.2)    Time: 1610-9604 PT Time Calculation (min) (ACUTE ONLY): 27 min   Charges:   PT Evaluation $PT Eval Moderate Complexity: 1 Mod PT Treatments $Gait Training: 8-22 mins   PT G Codes:        [name], PT DPT  Board Certified Neurologic Specialist [phone]   [name] 09/11/2016, 10:34 AM

## 2016-09-12 LAB — CBC
HCT: 32 % — ABNORMAL LOW (ref 36.0–46.0)
Hemoglobin: 10 g/dL — ABNORMAL LOW (ref 12.0–15.0)
MCH: 29.9 pg (ref 26.0–34.0)
MCHC: 31.3 g/dL (ref 30.0–36.0)
MCV: 95.5 fL (ref 78.0–100.0)
PLATELETS: 243 10*3/uL (ref 150–400)
RBC: 3.35 MIL/uL — AB (ref 3.87–5.11)
RDW: 14.7 % (ref 11.5–15.5)
WBC: 7.7 10*3/uL (ref 4.0–10.5)

## 2016-09-12 LAB — BASIC METABOLIC PANEL
Anion gap: 8 (ref 5–15)
BUN: 41 mg/dL — ABNORMAL HIGH (ref 6–20)
CHLORIDE: 105 mmol/L (ref 101–111)
CO2: 26 mmol/L (ref 22–32)
CREATININE: 1.16 mg/dL — AB (ref 0.44–1.00)
Calcium: 8.5 mg/dL — ABNORMAL LOW (ref 8.9–10.3)
GFR, EST AFRICAN AMERICAN: 54 mL/min — AB (ref >60)
GFR, EST NON AFRICAN AMERICAN: 46 mL/min — AB (ref >60)
Glucose, Bld: 226 mg/dL — ABNORMAL HIGH (ref 65–99)
POTASSIUM: 4.3 mmol/L (ref 3.5–5.1)
SODIUM: 139 mmol/L (ref 135–145)

## 2016-09-12 LAB — GLUCOSE, CAPILLARY
GLUCOSE-CAPILLARY: 169 mg/dL — AB (ref 65–99)
Glucose-Capillary: 196 mg/dL — ABNORMAL HIGH (ref 65–99)

## 2016-09-12 MED ORDER — ACETAMINOPHEN 325 MG PO TABS: 650.0000 mg | ORAL_TABLET | ORAL | 0 refills | Status: DC | PRN

## 2016-09-12 NOTE — Discharge Instructions (Signed)
Heart Failure °Heart failure means your heart has trouble pumping blood. This makes it hard for your body to work well. Heart failure is usually a long-term (chronic) condition. You must take good care of yourself and follow your doctor's treatment plan. °Follow these instructions at home: °· Take your heart medicine as told by your doctor. °? Do not stop taking medicine unless your doctor tells you to. °? Do not skip any dose of medicine. °? Refill your medicines before they run out. °? Take other medicines only as told by your doctor or pharmacist. °· Stay active if told by your doctor. The elderly and people with severe heart failure should talk with a doctor about physical activity. °· Eat heart-healthy foods. Choose foods that are without trans fat and are low in saturated fat, cholesterol, and salt (sodium). This includes fresh or frozen fruits and vegetables, fish, lean meats, fat-free or low-fat dairy foods, whole grains, and high-fiber foods. Lentils and dried peas and beans (legumes) are also good choices. °· Limit salt if told by your doctor. °· Cook in a healthy way. Roast, grill, broil, bake, poach, steam, or stir-fry foods. °· Limit fluids as told by your doctor. °· Weigh yourself every morning. Do this after you pee (urinate) and before you eat breakfast. Write down your weight to give to your doctor. °· Take your blood pressure and write it down if your doctor tells you to. °· Ask your doctor how to check your pulse. Check your pulse as told. °· Lose weight if told by your doctor. °· Stop smoking or chewing tobacco. Do not use gum or patches that help you quit without your doctor's approval. °· Schedule and go to doctor visits as told. °· Nonpregnant women should have no more than 1 drink a day. Men should have no more than 2 drinks a day. Talk to your doctor about drinking alcohol. °· Stop illegal drug use. °· Stay current with shots (immunizations). °· Manage your health conditions as told by your  doctor. °· Learn to manage your stress. °· Rest when you are tired. °· If it is really hot outside: °? Avoid intense activities. °? Use air conditioning or fans, or get in a cooler place. °? Avoid caffeine and alcohol. °? Wear loose-fitting, lightweight, and light-colored clothing. °· If it is really cold outside: °? Avoid intense activities. °? Layer your clothing. °? Wear mittens or gloves, a hat, and a scarf when going outside. °? Avoid alcohol. °· Learn about heart failure and get support as needed. °· Get help to maintain or improve your quality of life and your ability to care for yourself as needed. °Contact a doctor if: °· You gain weight quickly. °· You are more short of breath than usual. °· You cannot do your normal activities. °· You tire easily. °· You cough more than normal, especially with activity. °· You have any or more puffiness (swelling) in areas such as your hands, feet, ankles, or belly (abdomen). °· You cannot sleep because it is hard to breathe. °· You feel like your heart is beating fast (palpitations). °· You get dizzy or light-headed when you stand up. °Get help right away if: °· You have trouble breathing. °· There is a change in mental status, such as becoming less alert or not being able to focus. °· You have chest pain or discomfort. °· You faint. °This information is not intended to replace advice given to you by your health care provider. Make sure you   discuss any questions you have with your health care provider. °Document Released: 09/30/2007 Document Revised: 05/29/2015 Document Reviewed: 02/07/2012 °Elsevier Interactive Patient Education © 2017 [employer] ° °

## 2016-09-12 NOTE — Progress Notes (Signed)
Physical Therapy Treatment Patient Details Name: [name] MRN: [id number] DOB: 02-Nov-1945 Today's Date: 09/12/2016    History of Present Illness 71 y.o. female with medical history significant of dCHF, hypertension, hyperlipidemia, diabetes mellitus, GERD, depression, anxiety, seizure, obesity, CAD, 5 stent placement, who presents with shortness breath (CHF exacerbation).    PT Comments    Patient is progressing toward goals. Able to walk greater distance (150 feet) today with later onset of audible wheezing and DOE symptoms. She will continue to benefit from skilled PT services in the acute setting for improved mobility and function, as well as education on energy conservation techniques and safety.    Follow Up Recommendations  Home health PT;Supervision/Assistance - 24 hour;Supervision for mobility/OOB     Equipment Recommendations   (tbd)    Recommendations for Other Services       Precautions / Restrictions Precautions Precautions: Fall Precaution Comments: watch O2 saturations Restrictions Weight Bearing Restrictions: No    Mobility  Bed Mobility               General bed mobility comments: pt received upright EOB with nursing in the room.  Transfers Overall transfer level: Needs assistance Equipment used: None Transfers: Sit to/from Stand Sit to Stand: Min guard;Min assist         General transfer comment: min assist for stability during power up. use of 1 UE for push off  Ambulation/Gait Ambulation/Gait assistance: Min guard;Min assist Ambulation Distance (Feet): 150 Feet Assistive device: Rolling walker (2 wheeled) Gait Pattern/deviations: Step-through pattern;Decreased stride length;Shuffle;Trunk flexed;Wide base of support Gait velocity: decreased Gait velocity interpretation: Below normal speed for age/gender General Gait Details: VCs for pacing. Min assist for stability intermittently with turns and high level balance challenges. DOE and  audible wheezing with ambulation. (amb on RA, O2 sats to 96%. 1 min rebound)   Stairs            Wheelchair Mobility    Modified Rankin (Stroke Patients Only)       Balance Overall balance assessment: Needs assistance   Sitting balance-Leahy Scale: Fair     Standing balance support: During functional activity Standing balance-Leahy Scale: Poor Standing balance comment: required 1 UE on RW, 1 UE on sink counter for support in static standing             High level balance activites: Turns;Head turns High Level Balance Comments: Pt performed head turns to 4 directions, able to continue walking and progressing RW forward. Unaware of obstacles in the hallway.             Cognition Arousal/Alertness: Awake/alert Behavior During Therapy: WFL for tasks assessed/performed Overall Cognitive Status: Within Functional Limits for tasks assessed Area of Impairment: Safety/judgement                         Safety/Judgement: Decreased awareness of safety;Decreased awareness of deficits     General Comments: Pt with poor awareness of deficits and safety. States that she doesn't use RW at home.      Exercises      General Comments General comments (skin integrity, edema, etc.): Pt educated on pursed lip breathing after activities.       Pertinent Vitals/Pain Pain Assessment: Faces Pain Score: 9  Faces Pain Scale: Hurts a little bit Pain Location: headache Pain Descriptors / Indicators: Headache Pain Intervention(s): Monitored during session;Patient requesting pain meds-RN notified    Home Living  Prior Function            PT Goals (current goals can now be found in the care plan section) Acute Rehab PT Goals Patient Stated Goal: to go home PT Goal Formulation: With patient Time For Goal Achievement: 09/25/16 Potential to Achieve Goals: Fair Progress towards PT goals: Progressing toward goals    Frequency    Min  3X/week      PT Plan Current plan remains appropriate    Co-evaluation              AM-PAC PT "6 Clicks" Daily Activity  Outcome Measure  Difficulty turning over in bed (including adjusting bedclothes, sheets and blankets)?: A Lot Difficulty moving from lying on back to sitting on the side of the bed? : A Lot Difficulty sitting down on and standing up from a chair with arms (e.g., wheelchair, bedside commode, etc,.)?: A Lot Help needed moving to and from a bed to chair (including a wheelchair)?: A Little Help needed walking in hospital room?: A Little Help needed climbing 3-5 steps with a railing? : A Lot 6 Click Score: 14    End of Session Equipment Utilized During Treatment: Gait belt Activity Tolerance: Patient limited by fatigue;Other (comment) (audible wheezing and DOE with mobility) Patient left: in chair;with call bell/phone within reach;with chair alarm set;with family/visitor present Nurse Communication: Mobility status PT Visit Diagnosis: Difficulty in walking, not elsewhere classified (R26.2)     Time: 3009-2330 PT Time Calculation (min) (ACUTE ONLY): 23 min  Charges:  $Gait Training: 8-22 mins $Therapeutic Activity: 8-22 mins                    G CodesSindy Guadeloupe, SPT [phone] office    Margarita Grizzle 09/12/2016, 11:32 AM

## 2016-09-12 NOTE — Care Management Note (Signed)
Case Management Note Marvetta Gibbons RN, BSN Unit 4E-Case Manager-- weekend coverage- Tawny Asal [phone]  Patient Details  Name: [name] MRN: [id number] Date of Birth: [birth year]  Subjective/Objective:  Pt admitted with acute on chronic HF                 Action/Plan: PTA pt  Lived at home with family- has RW at home, orders placed for home 02- and HHRN/PT/OT/aide- spoke with pt's daughter and spouse at bedside- choice offered for [facility] agency and DME needs- per family they do not have a preference for [facility] agency and are ok with using [facility] for DME and HH needs- referral called to [facility] with [facility] for home 02 DME needs- portable tank to be delivered to room prior to discharge- also gave referral for HHRN/PT/OT/aide- referral has been accepted by [facility].   Expected Discharge Date:  09/12/16               Expected Discharge Plan:  [place]  In-House Referral:  NA  Discharge planning Services  CM Consult  Post Acute Care Choice:  Durable Medical Equipment, Home Health Choice offered to:  Adult Children  DME Arranged:  Oxygen DME Agency:  [place] Arranged:  RN, PT, OT, Nurse's Aide [place] Agency:  [place]  Status of Service:  Completed, signed off  If discussed at [place] of Stay Meetings, dates discussed:    Discharge Disposition: home/home health   Additional Comments:  Dawayne Patricia, RN 09/12/2016, 2:11 PM

## 2016-09-12 NOTE — Progress Notes (Signed)
Patient requiring oxygen at home. Patient was on IV and PO lasix but still had oxygen desaturation requiring continuous oxygen. Marcille Blanco, RN

## 2016-09-12 NOTE — Discharge Summary (Signed)
Physician Discharge Summary  [name] IEP:[id number] DOB: [birth year] DOA: 09/09/2016  PCP: Briscoe Deutscher, DO  Admit date: 09/09/2016 Discharge date: 09/12/2016  Recommendations for Outpatient Follow-up:  Continue home dose lasix and if weight gain more than 3 lbs in 24 hour period take an extra tablet of lasix.  Discharge Diagnoses:  Principal Problem:   Acute on chronic diastolic CHF (congestive heart failure) (HCC) Active Problems:   Depression   Essential hypertension   Diabetes mellitus without complication (HCC)   Seizure disorder (HCC)   Anxiety   Dyslipidemia   Elevated troponin    Discharge Condition: stable   Diet recommendation: as tolerated   History of present illness:  Pt is 71 yo female with known dCHF, HTN, HLD, DM, depression and seizures, CAD and s/p 5 stents placement. Pt presented with 3 weeks duration of progressively worsening dyspnea.   Hospital Course:   Acute on chronic diastolic CHF, elevated trop's - elevated trop likely related to acute dCHF - ECHO with stable EF 55 - 60 % and grade II diastolic CHF - pt has been started on lasix 40 mg IV BID - Weight down since admission - May resume home dose lasix an dtake an extra  Tablet if more than 3 lb weight gain in 24 hour period - Pt instructed to follow up with cardio in 1 week to make sure symptoms are stable  CAD s/p multiple stentswith stent restenosis with h/o aortic valve stenosis/regurg - she was evaluated at [facility] thought to be hight risk for CABG/AVR, but recommended TAVR, it dose not appear patient has followed up since. 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 - Counseled on diet, nutrition   FFT - Pt declined offer to search for SNF   DVT prophylaxis: Lovenox subQ Code Status: PARTIAL CODE, no intubation but OK for CPR Family Communication: No family at the bedside this am   Consultants:   None  Procedures:   ECHO  Antimicrobials:   None   Signed:  Leisa Lenz, MD  [facility] 09/12/2016, 12:49 PM  Pager #: [phone]  Time spent in minutes: more than 30 minutes    Discharge Exam: Vitals:   09/12/16 1008 09/12/16 1226  BP: (!) 151/70 (!) 147/67  Pulse: 68 (!) 57  Resp:  20  Temp:  98.1 F (36.7 C)  SpO2:  100%   Vitals:   09/11/16 2115 09/12/16 0637 09/12/16 1008 09/12/16 1226  BP: (!) 142/60 (!) 118/54 (!) 151/70 (!) 147/67  Pulse: 72 62 68 (!) 57  Resp: 20 18  20   Temp:  98.4 F (36.9 C)  98.1 F (36.7 C)  TempSrc:  Oral  Oral  SpO2: 96%   100%  Weight:  112.1 kg (247 lb 3.2 oz)    Height:        General: Pt is alert, follows commands appropriately, not in acute distress Cardiovascular: Regular rate and rhythm, S1/S2 + Respiratory: Clear to auscultation bilaterally, no wheezing Abdominal: Soft, non tender, non distended, bowel sounds +, no guarding Extremities: no cyanosis, pulses palpable bilaterally DP and  PT Neuro: Grossly nonfocal  Discharge Instructions  Discharge Instructions    Avoid straining    Complete by:  As directed    Call MD for:  persistant nausea and vomiting    Complete by:  As directed    Call MD for:  redness, tenderness, or signs of infection (pain, swelling, redness, odor or green/yellow discharge around incision site)    Complete by:  As directed    Call MD for:  severe uncontrolled pain    Complete by:  As directed    Call MD for:  temperature >100.4    Complete by:  As directed    Diet - low sodium heart healthy    Complete by:  As directed    Diet - low sodium heart healthy    Complete by:  As directed    Diet Carb  Modified    Complete by:  As directed    Discharge instructions    Complete by:  As directed    Continue home dose lasix and if weight gain more than 3 lbs in 24 hour period take an extra tablet of lasix.   Heart Failure patients record your daily weight using the same scale at the same time of day    Complete by:  As directed    Increase activity slowly    Complete by:  As directed    Increase activity slowly    Complete by:  As directed    STOP any activity that causes chest pain, shortness of breath, dizziness, sweating, or exessive weakness    Complete by:  As directed      Allergies as of 09/12/2016      Reactions   Codeine Itching      Medication List    STOP taking these medications   Insulin Syringe-Needle U-100 31G X 15/64" 0.5 ML Misc     TAKE these medications   acetaminophen 325 MG tablet Commonly known as:  TYLENOL Take 2 tablets (650 mg total) by mouth every 4 (four) hours as needed for headache or mild pain.   albuterol 108 (90 Base) MCG/ACT inhaler Commonly known as:  PROVENTIL HFA;VENTOLIN HFA Inhale 2 puffs into the lungs every 6 (six) hours as needed for wheezing or shortness of breath.   ALPRAZolam 0.5 MG tablet Commonly known as:  XANAX Take 0.5 mg by mouth 2 (two) times daily.   aspirin 325 MG EC tablet Take 1 tablet (325 mg total) by mouth daily. What changed:  how much to take   furosemide 40 MG tablet Commonly known as:  LASIX Take 20 mg by mouth daily.   hydrOXYzine 10 MG tablet Commonly known as:  ATARAX/VISTARIL Take 1 tablet (10 mg total) by mouth 3 (three) times daily as needed for itching.   insulin aspart 100 UNIT/ML injection Commonly known as:  novoLOG Inject 0-9 Units into the skin 3 (three) times daily with meals. Sliding scale CBG 70 - 120: 0 units CBG 121 - 150: 1 unit,  CBG 151 - 200: 2 units,  CBG 201 - 250: 3 units,  CBG 251 - 300: 5 units,  CBG 301 - 350: 7 units,  CBG 351 - 400: 9 units   CBG > 400: 9 units and notify your  MD   insulin detemir 100 UNIT/ML injection Commonly known as:  LEVEMIR Inject 10 Units into the skin at bedtime.   isosorbide mononitrate 60 MG 24 hr tablet Commonly known as:  IMDUR Take 60 mg by  mouth daily.   levETIRAcetam 1000 MG tablet Commonly known as:  KEPPRA Take 1 tablet (1,000 mg total) by mouth 2 (two) times daily.   LORazepam 0.5 MG tablet Commonly known as:  ATIVAN Take 1 tablet (0.5 mg total) by mouth 2 (two) times daily.   metoprolol tartrate 50 MG tablet Commonly known as:  LOPRESSOR Take 50 mg by mouth 2 (two) times daily.   nitroGLYCERIN 0.4 MG SL tablet Commonly known as:  NITROSTAT Place 0.4 mg under the tongue every 5 (five) minutes as needed for chest pain.   prasugrel 10 MG Tabs tablet Commonly known as:  EFFIENT Take 10 mg by mouth daily.   rizatriptan 10 MG tablet Commonly known as:  MAXALT Take 10 mg by mouth as needed for migraine. May repeat in 2 hours if needed   rosuvastatin 10 MG tablet Commonly known as:  CRESTOR Take 10 mg by mouth daily.   sertraline 100 MG tablet Commonly known as:  ZOLOFT Take 100 mg by mouth every morning.            Durable Medical Equipment        Start     Ordered   09/12/16 1236  For home use only DME oxygen  Once    Question Answer Comment  Mode or (Route) Nasal cannula   Liters per Minute 2   Frequency Continuous (stationary and portable oxygen unit needed)   Oxygen delivery system Gas      09/12/16 1235   09/11/16 1106  For home use only DME oxygen  Once    Question Answer Comment  Mode or (Route) Nasal cannula   Liters per Minute 2   Frequency Continuous (stationary and portable oxygen unit needed)   Oxygen delivery system Gas      09/11/16 1106   09/11/16 1106  For home use only DME Walker rolling  Once    Question:  Patient needs a walker to treat with the following condition  Answer:  Weakness   09/11/16 1106       Discharge Care Instructions        Start     Ordered    09/12/16 0000  acetaminophen (TYLENOL) 325 MG tablet  Every 4 hours PRN     09/12/16 1249   09/12/16 0000  Diet - low sodium heart healthy     09/12/16 1249   09/12/16 0000  Increase activity slowly     09/12/16 1249   09/12/16 0000  Increase activity slowly     09/12/16 1249   09/12/16 0000  Diet - low sodium heart healthy     09/12/16 1249   09/12/16 0000  Discharge instructions    Comments:  Continue home dose lasix and if weight gain more than 3 lbs in 24 hour period take an extra tablet of lasix.   09/12/16 1249   09/12/16 0000  Call MD for:  temperature >100.4     09/12/16 1249   09/12/16 0000  Call MD for:  persistant nausea and vomiting     09/12/16 1249   09/12/16 0000  Call MD for:  severe uncontrolled pain     09/12/16 1249   09/12/16 0000  Call MD for:  redness, tenderness, or signs of infection (pain, swelling, redness, odor or green/yellow discharge around incision site)     09/12/16 1249   09/12/16 0000  Heart Failure patients record your daily weight using the same scale at the same time of day  09/12/16 1249   09/12/16 0000  Avoid straining     09/12/16 1249   09/12/16 0000  STOP any activity that causes chest pain, shortness of breath, dizziness, sweating, or exessive weakness     09/12/16 1249   09/12/16 0000  Diet Carb Modified     09/12/16 1249     Follow-up Information    Briscoe Deutscher, DO Follow up.   Specialty:  Family Medicine Contact information: [place] [address] [phone]            The results of significant diagnostics from this hospitalization (including imaging, microbiology, ancillary and laboratory) are listed below for reference.    Significant Diagnostic Studies: Dg Chest 2 View  Result Date: 09/09/2016 CLINICAL DATA:  Dyspnea and shortness of breath EXAM: CHEST  2 VIEW COMPARISON:  10/02/2015 FINDINGS: Low lung volumes. No focal consolidation or effusion. Borderline cardiomegaly. Aortic  atherosclerosis. No pneumothorax. Degenerative changes of the spine IMPRESSION: 1. Low lung volume without acute infiltrate 2. Borderline cardiomegaly, likely augmented by low lung volume Electronically Signed   By: Donavan Foil M.D.   On: 09/09/2016 18:15    Microbiology: No results found for this or any previous visit (from the past 240 hour(s)).   Labs: Basic Metabolic Panel:  Recent Labs Lab 09/09/16 1731 09/10/16 0051 09/11/16 0211 09/12/16 0519  NA 141 140 140 139  K 3.9 3.9 4.8 4.3  CL 110 107 106 105  CO2 23 25 25 26   GLUCOSE 264* 238* 169* 226*  BUN 24* 24* 30* 41*  CREATININE 0.87 0.95 1.12* 1.16*  CALCIUM 8.6* 8.5* 8.9 8.5*  MG  --   --  1.7  --    Liver Function Tests: No results for input(s): AST, ALT, ALKPHOS, BILITOT, PROT, ALBUMIN in the last 168 hours. No results for input(s): LIPASE, AMYLASE in the last 168 hours. No results for input(s): AMMONIA in the last 168 hours. CBC:  Recent Labs Lab 09/09/16 1731 09/12/16 0519  WBC 7.3 7.7  HGB 10.7* 10.0*  HCT 33.7* 32.0*  MCV 95.2 95.5  PLT 230 243   Cardiac Enzymes:  Recent Labs Lab 09/09/16 1731 09/10/16 0051 09/10/16 1044  TROPONINI 0.07* 0.07* 0.07*   BNP: BNP (last 3 results)  Recent Labs  09/09/16 1731  BNP 847.5*    ProBNP (last 3 results) No results for input(s): PROBNP in the last 8760 hours.  CBG:  Recent Labs Lab 09/11/16 1139 09/11/16 1635 09/11/16 2139 09/12/16 0741 09/12/16 1110  GLUCAP 180* 210* 214* 196* 169*

## 2016-09-13 ENCOUNTER — Telehealth: Payer: Self-pay | Admitting: *Deleted

## 2016-09-13 ENCOUNTER — Telehealth: Payer: Self-pay | Admitting: Family Medicine

## 2016-09-13 DIAGNOSIS — I2510 Atherosclerotic heart disease of native coronary artery without angina pectoris: Secondary | ICD-10-CM | POA: Diagnosis not present

## 2016-09-13 DIAGNOSIS — I5033 Acute on chronic diastolic (congestive) heart failure: Secondary | ICD-10-CM | POA: Diagnosis not present

## 2016-09-13 DIAGNOSIS — Z794 Long term (current) use of insulin: Secondary | ICD-10-CM | POA: Diagnosis not present

## 2016-09-13 DIAGNOSIS — Z6841 Body Mass Index (BMI) 40.0 and over, adult: Secondary | ICD-10-CM | POA: Diagnosis not present

## 2016-09-13 DIAGNOSIS — Z9981 Dependence on supplemental oxygen: Secondary | ICD-10-CM | POA: Diagnosis not present

## 2016-09-13 DIAGNOSIS — Z7982 Long term (current) use of aspirin: Secondary | ICD-10-CM | POA: Diagnosis not present

## 2016-09-13 DIAGNOSIS — F329 Major depressive disorder, single episode, unspecified: Secondary | ICD-10-CM | POA: Diagnosis not present

## 2016-09-13 DIAGNOSIS — G40909 Epilepsy, unspecified, not intractable, without status epilepticus: Secondary | ICD-10-CM | POA: Diagnosis not present

## 2016-09-13 DIAGNOSIS — K219 Gastro-esophageal reflux disease without esophagitis: Secondary | ICD-10-CM | POA: Diagnosis not present

## 2016-09-13 DIAGNOSIS — I110 Hypertensive heart disease with heart failure: Secondary | ICD-10-CM | POA: Diagnosis not present

## 2016-09-13 DIAGNOSIS — Z87891 Personal history of nicotine dependence: Secondary | ICD-10-CM | POA: Diagnosis not present

## 2016-09-13 DIAGNOSIS — F419 Anxiety disorder, unspecified: Secondary | ICD-10-CM | POA: Diagnosis not present

## 2016-09-13 DIAGNOSIS — E785 Hyperlipidemia, unspecified: Secondary | ICD-10-CM | POA: Diagnosis not present

## 2016-09-13 DIAGNOSIS — E119 Type 2 diabetes mellitus without complications: Secondary | ICD-10-CM | POA: Diagnosis not present

## 2016-09-13 NOTE — Telephone Encounter (Signed)
[facility] called in reference to needing verbal orders to sign plan of care and if Dr. Juleen China is wanting patient to have diuretic protocal kit. Please call [facility] and advise.

## 2016-09-13 NOTE — Telephone Encounter (Signed)
Attempted TCM/Hospital follow up. Call went to voicemail. Message left requesting call back. Appt is already scheduled for 09/15/16.

## 2016-09-14 ENCOUNTER — Telehealth: Payer: Self-pay | Admitting: Endocrinology

## 2016-09-14 ENCOUNTER — Telehealth: Payer: Self-pay | Admitting: Family Medicine

## 2016-09-14 ENCOUNTER — Other Ambulatory Visit: Payer: Self-pay

## 2016-09-14 DIAGNOSIS — I110 Hypertensive heart disease with heart failure: Secondary | ICD-10-CM | POA: Diagnosis not present

## 2016-09-14 DIAGNOSIS — K219 Gastro-esophageal reflux disease without esophagitis: Secondary | ICD-10-CM | POA: Diagnosis not present

## 2016-09-14 DIAGNOSIS — I2510 Atherosclerotic heart disease of native coronary artery without angina pectoris: Secondary | ICD-10-CM | POA: Diagnosis not present

## 2016-09-14 DIAGNOSIS — I5033 Acute on chronic diastolic (congestive) heart failure: Secondary | ICD-10-CM | POA: Diagnosis not present

## 2016-09-14 DIAGNOSIS — E119 Type 2 diabetes mellitus without complications: Secondary | ICD-10-CM | POA: Diagnosis not present

## 2016-09-14 DIAGNOSIS — G40909 Epilepsy, unspecified, not intractable, without status epilepticus: Secondary | ICD-10-CM | POA: Diagnosis not present

## 2016-09-14 MED ORDER — INSULIN ASPART 100 UNIT/ML ~~LOC~~ SOLN: 0.0000 [IU] | Freq: Three times a day (TID) | SUBCUTANEOUS | 11 refills | Status: DC

## 2016-09-14 MED ORDER — INSULIN DETEMIR 100 UNIT/ML ~~LOC~~ SOLN: 10.0000 [IU] | Freq: Every day | SUBCUTANEOUS | 11 refills | Status: DC

## 2016-09-14 NOTE — Telephone Encounter (Signed)
Please see message and advise 

## 2016-09-14 NOTE — Telephone Encounter (Signed)
Send request to Cardiology. See other message for other.

## 2016-09-14 NOTE — Telephone Encounter (Signed)
I will refill the Zoloft x 1 month. She must be seen for a follow up next week. It is essential for her health after this hospital stay.  I need clarification. At the last visit, we spent a significant amount of time going over medications. Her updated list:  .  albuterol (PROVENTIL HFA;VENTOLIN HFA) 108 (90 Base) MCG/ACT inhaler, Inhale into the lungs every 6 (six) hours as needed for wheezing or shortness of breath., Disp: , Rfl:  .  ALPRAZolam (XANAX) 0.5 MG tablet, Take 0.5 mg by mouth at bedtime as needed for anxiety., Disp: , Rfl:  .  aspirin EC 325 MG EC tablet, Take 1 tablet (325 mg total) by mouth daily., Disp: 30 tablet, Rfl: 0 .  furosemide (LASIX) 20 MG tablet, Take 20 mg by mouth., Disp: , Rfl:  .  insulin aspart (NOVOLOG) 100 UNIT/ML injection, Inject 0-9 Units into the skin 3 (three) times daily with meals. Sliding scale CBG 70 - 120: 0 units CBG 121 - 150: 1 unit,  CBG 151 - 200: 2 units,  CBG 201 - 250: 3 units,  CBG 251 - 300: 5 units,  CBG 301 - 350: 7 units,  CBG 351 - 400: 9 units   CBG > 400: 9 units and notify your MD, Disp: 10 mL, Rfl: 11 .  insulin detemir (LEVEMIR) 100 UNIT/ML injection, Inject 10 Units into the skin at bedtime., Disp: , Rfl:  .  isosorbide mononitrate (IMDUR) 30 MG 24 hr tablet, Take 30 mg by mouth daily., Disp: , Rfl:  .  levETIRAcetam (KEPPRA) 1000 MG tablet, Take 1 tablet (1,000 mg total) by mouth 2 (two) times daily., Disp: 30 tablet, Rfl: 0 .  LORazepam (ATIVAN) 0.5 MG tablet, Take 1 tablet (0.5 mg total) by mouth 2 (two) times daily., Disp: 60 tablet, Rfl: 0 .  metoprolol (LOPRESSOR) 50 MG tablet, Take 50 mg by mouth 2 (two) times daily. , Disp: , Rfl: 0 .  nitroGLYCERIN (NITROSTAT) 0.4 MG SL tablet, Place 0.4 mg under the tongue every 5 (five) minutes as needed for chest pain., Disp: , Rfl:  .  prasugrel (EFFIENT) 10 MG TABS tablet, Take 10 mg by mouth daily., Disp: , Rfl:  .  rizatriptan (MAXALT) 10 MG tablet, Take 10 mg by mouth as needed for  migraine. May repeat in 2 hours if needed, Disp: , Rfl:  .  rosuvastatin (CRESTOR) 10 MG tablet, Take 10 mg by mouth daily., Disp: , Rfl:  .  sertraline (ZOLOFT) 50 MG tablet, Take 100 mg by mouth every morning. , Disp: , Rfl: 0  (1) From the message, it sounds like she has NOT been taking the Zoloft after all?  (2) The other message from [place] is asking if I want to sign orders for diuretic protocol - IV Lasix, correct? Does she have a follow up visit with Cardiology? They should be managing her cardiac issues at this point.   (3) We need to clarify the patient's long-goals. She should be seen by Cassie. Needs appointment ASAP.   (4) She has multiple HH requirements and I'm assuming that I will be tasked to complete the paperwork. This will need a visit.   (4) An hour was set aside for this patient due to her multiple issues. Due to rescheduling, she was put in a 30 minute slot, which is just not going to work. I am willing to work through lunch if needed, but want my later appointment times blocked that day. (  FYI for Lea - Thanks!!!!!)

## 2016-09-14 NOTE — Telephone Encounter (Signed)
Patient rescheduled appointment visit to 09/19. She states that she just got out of the hospital and that she is too weak to come in and that she has a hard time moving around her oxygen. Patient would like to have zoloft filled as well. She states that they gave it to her in the hospital and she wants to continue it.

## 2016-09-14 NOTE — Telephone Encounter (Signed)
MEDICATION: zoloft  PHARMACY:    [place], [place] - [address] [phone] ([phone] (Fax)         IS A 90 DAY SUPPLY : Y  IS PATIENT OUT OF MEDICATION: completely out  IF NOT; HOW MUCH IS LEFT:   LAST APPOINTMENT DATE: @9 /10/2016  NEXT APPOINTMENT DATE:@9 /12/2016  OTHER COMMENTS:    **Let patient know to contact pharmacy at the end of the day to make sure medication is ready. **  ** Please notify patient to allow 48-72 hours to process**  **Encourage patient to contact the pharmacy for refills or they can request refills through [facility]**

## 2016-09-14 NOTE — Telephone Encounter (Signed)
MEDICATION:   insulin aspart (NOVOLOG) 100 UNIT/ML injection    insulin detemir (LEVEMIR) 100 UNIT/ML injection      PHARMACY:    [place], [place] - [address] [phone] (Phone) [phone] (Fax)     IS THIS A 90 DAY SUPPLY : yes  IS PATIENT OUT OF MEDICATION: 1 pen left  IF NOT; HOW MUCH IS LEFT: 1 pen  LAST APPOINTMENT DATE: 07/15/16  NEXT APPOINTMENT DATE:@10 /08/2016  OTHER COMMENTS:    **Let patient know to contact pharmacy at the end of the day to make sure medication is ready. **  ** Please notify patient to allow 48-72 hours to process**  **Encourage patient to contact the pharmacy for refills or they can request refills through [facility]**

## 2016-09-15 ENCOUNTER — Ambulatory Visit: Payer: Medicare Other | Admitting: Endocrinology

## 2016-09-15 ENCOUNTER — Ambulatory Visit: Payer: Medicare Other | Admitting: Family Medicine

## 2016-09-15 ENCOUNTER — Ambulatory Visit: Payer: Medicare Other | Admitting: *Deleted

## 2016-09-15 DIAGNOSIS — I2510 Atherosclerotic heart disease of native coronary artery without angina pectoris: Secondary | ICD-10-CM | POA: Diagnosis not present

## 2016-09-15 DIAGNOSIS — I110 Hypertensive heart disease with heart failure: Secondary | ICD-10-CM | POA: Diagnosis not present

## 2016-09-15 DIAGNOSIS — G40909 Epilepsy, unspecified, not intractable, without status epilepticus: Secondary | ICD-10-CM | POA: Diagnosis not present

## 2016-09-15 DIAGNOSIS — E119 Type 2 diabetes mellitus without complications: Secondary | ICD-10-CM | POA: Diagnosis not present

## 2016-09-15 DIAGNOSIS — K219 Gastro-esophageal reflux disease without esophagitis: Secondary | ICD-10-CM | POA: Diagnosis not present

## 2016-09-15 DIAGNOSIS — I5033 Acute on chronic diastolic (congestive) heart failure: Secondary | ICD-10-CM | POA: Diagnosis not present

## 2016-09-15 NOTE — Telephone Encounter (Signed)
Anderson Malta calling back from [facility].  The request for the protocol kit needs to be faxed by Dr. Juleen China W/ [employer].   [place]  Fax: [place]: Car[place]lyn Leigh

## 2016-09-15 NOTE — Telephone Encounter (Signed)
I called and left a VM on Jennifer Anderson's phone# stating that Dr Juleen China will not be ordering the diuretic protocol kit and that the order needs to come from the patients cardiologist.

## 2016-09-15 NOTE — Telephone Encounter (Signed)
I called and spoke to [facility] and asked that they get the order for the diuretic protocol kit from pts cardiologist per Dr Alcario Drought request.

## 2016-09-15 NOTE — Telephone Encounter (Signed)
Spoke with pt, she states that she does plan to come to her follow up appt next week. She has an appt with Dr Loanne Drilling 10/11/16. She does not have an appt with Cardiologist but she will call and schedule one.  Transition Care Management Follow-up Telephone Call   Date discharged? 09/12/16   How have you been since you were released from the hospital? [place]   Do you understand why you were in the hospital? yes   Do you understand the discharge instructions? yes   Where were you discharged to? Home.    Items Reviewed:  Medications reviewed: yes  Allergies reviewed: yes  Dietary changes reviewed: yes  Referrals reviewed: yes   Functional Questionnaire:   Activities of Daily Living (ADLs):   She states they are independent in the following:Daughter is at the house helping pt.  States they require assistance with the following:    Any transportation issues/concerns?: no   Any patient concerns? no   Confirmed importance and date/time of follow-up visits scheduled yes  Provider Appointment booked with Dr Juleen China 09/22/16 11:30  Confirmed with patient if condition begins to worsen call PCP or go to the ER.  Patient was given the office number and encouraged to call back with question or concerns.  : yes

## 2016-09-16 DIAGNOSIS — I2510 Atherosclerotic heart disease of native coronary artery without angina pectoris: Secondary | ICD-10-CM | POA: Diagnosis not present

## 2016-09-16 DIAGNOSIS — E119 Type 2 diabetes mellitus without complications: Secondary | ICD-10-CM | POA: Diagnosis not present

## 2016-09-16 DIAGNOSIS — I110 Hypertensive heart disease with heart failure: Secondary | ICD-10-CM | POA: Diagnosis not present

## 2016-09-16 DIAGNOSIS — G40909 Epilepsy, unspecified, not intractable, without status epilepticus: Secondary | ICD-10-CM | POA: Diagnosis not present

## 2016-09-16 DIAGNOSIS — K219 Gastro-esophageal reflux disease without esophagitis: Secondary | ICD-10-CM | POA: Diagnosis not present

## 2016-09-16 DIAGNOSIS — I5033 Acute on chronic diastolic (congestive) heart failure: Secondary | ICD-10-CM | POA: Diagnosis not present

## 2016-09-16 NOTE — Telephone Encounter (Signed)
Patient needs a new script for zoloft. It was not sent to her pharm. [place], [place] - [address]  Patient needs a glucometer as well.   Ty,  -LL

## 2016-09-20 ENCOUNTER — Other Ambulatory Visit: Payer: Self-pay | Admitting: *Deleted

## 2016-09-20 DIAGNOSIS — G40909 Epilepsy, unspecified, not intractable, without status epilepticus: Secondary | ICD-10-CM | POA: Diagnosis not present

## 2016-09-20 DIAGNOSIS — E119 Type 2 diabetes mellitus without complications: Secondary | ICD-10-CM | POA: Diagnosis not present

## 2016-09-20 DIAGNOSIS — K219 Gastro-esophageal reflux disease without esophagitis: Secondary | ICD-10-CM | POA: Diagnosis not present

## 2016-09-20 DIAGNOSIS — I5033 Acute on chronic diastolic (congestive) heart failure: Secondary | ICD-10-CM | POA: Diagnosis not present

## 2016-09-20 DIAGNOSIS — I2510 Atherosclerotic heart disease of native coronary artery without angina pectoris: Secondary | ICD-10-CM | POA: Diagnosis not present

## 2016-09-20 DIAGNOSIS — I110 Hypertensive heart disease with heart failure: Secondary | ICD-10-CM | POA: Diagnosis not present

## 2016-09-20 MED ORDER — SERTRALINE HCL 100 MG PO TABS: 100.0000 mg | ORAL_TABLET | Freq: Every morning | ORAL | 0 refills | Status: DC

## 2016-09-20 NOTE — Telephone Encounter (Signed)
Please advise on refill. This was not prescribed from you.

## 2016-09-20 NOTE — Telephone Encounter (Signed)
[facility] called in reference to patient needing Zoloft ASAP. Please call and advise. Patient also needs Rx for Atarax sent in.    MEDICATION: hydrOXYzine (ATARAX/VISTARIL) 10 MG tablet  PHARMACY:   [place], [place] - [address] [phone] (Phone) [phone] (Fax)     IS THIS A 90 DAY SUPPLY : Y  IS PATIENT OUT OF MEDICATION: Y  IF NOT; HOW MUCH IS LEFT:   LAST APPOINTMENT DATE: 08/16/16  NEXT APPOINTMENT DATE: 09/22/16  OTHER COMMENTS:    **Let patient know to contact pharmacy at the end of the day to make sure medication is ready. **  ** Please notify patient to allow 48-72 hours to process**  **Encourage patient to contact the pharmacy for refills or they can request refills through [facility]**

## 2016-09-21 NOTE — Telephone Encounter (Signed)
We can discuss this at her visit tomorrow.

## 2016-09-21 NOTE — Telephone Encounter (Signed)
Noted  

## 2016-09-22 ENCOUNTER — Ambulatory Visit (INDEPENDENT_AMBULATORY_CARE_PROVIDER_SITE_OTHER): Payer: Medicare Other | Admitting: Family Medicine

## 2016-09-22 ENCOUNTER — Encounter: Payer: Self-pay | Admitting: Family Medicine

## 2016-09-22 VITALS — BP 116/74 | HR 67 | Temp 98.4°F | Ht 60.0 in | Wt 239.0 lb

## 2016-09-22 DIAGNOSIS — E119 Type 2 diabetes mellitus without complications: Secondary | ICD-10-CM

## 2016-09-22 DIAGNOSIS — I5033 Acute on chronic diastolic (congestive) heart failure: Secondary | ICD-10-CM

## 2016-09-22 DIAGNOSIS — F419 Anxiety disorder, unspecified: Secondary | ICD-10-CM

## 2016-09-22 DIAGNOSIS — Z09 Encounter for follow-up examination after completed treatment for conditions other than malignant neoplasm: Secondary | ICD-10-CM

## 2016-09-22 DIAGNOSIS — F341 Dysthymic disorder: Secondary | ICD-10-CM | POA: Diagnosis not present

## 2016-09-22 DIAGNOSIS — Z23 Encounter for immunization: Secondary | ICD-10-CM

## 2016-09-22 MED ORDER — SERTRALINE HCL 100 MG PO TABS: 100.0000 mg | ORAL_TABLET | Freq: Every morning | ORAL | 3 refills | Status: DC

## 2016-09-22 MED ORDER — RIZATRIPTAN BENZOATE 10 MG PO TABS: 10.0000 mg | ORAL_TABLET | ORAL | 5 refills | Status: DC | PRN

## 2016-09-22 MED ORDER — HYDROXYZINE HCL 10 MG PO TABS: 10.0000 mg | ORAL_TABLET | Freq: Two times a day (BID) | ORAL | 5 refills | Status: DC | PRN

## 2016-09-22 MED ORDER — ALPRAZOLAM 0.5 MG PO TABS: 0.5000 mg | ORAL_TABLET | Freq: Two times a day (BID) | ORAL | 2 refills | Status: DC

## 2016-09-22 MED ORDER — ROSUVASTATIN CALCIUM 10 MG PO TABS
10.0000 mg | ORAL_TABLET | Freq: Every day | ORAL | 3 refills | Status: DC
Start: 2016-09-22 — End: 2017-01-21

## 2016-09-22 NOTE — Patient Instructions (Signed)
Ask your endo about Victoza or Trulicity instead of insulin.

## 2016-09-22 NOTE — Progress Notes (Signed)
[name] is a 71 y.o. female is here for follow up.  History of Present Illness:   Water quality scientist, CMA, acting as scribe for Dr. Juleen China.  Hospital Course: Stay for CHF exacerbation. Diuresed via IV Lasix. ECHO stable with EF 55 - 60 % and grade II diastolic CHF. DC home on oxygen. No other significant changes.  HPI:  Patient comes in today for hospital follow up.  She is wearing oxygen today.  States she was in the hospital for 5 days.  She reports feeling better since being in the hospital.  The oxygen is helping her breathe.  This is the first time she has been out of her house with the oxygen.  She states she is going to ask her home health nurse if she has to use it when she leaves her house.  Today, she states she needs refills on some of her medications.  She is asking for refills on Atarax, Xanax, and Maxalt.  She has been refilled on her Zoloft 100 mg daily.  She takes Xanax 0.5 mg twice daily.  She tried lorazepam after last visit and states she did not like it.  She was changed back to Xanax in the hospital.  She agrees to sign a controlled substance contract today.  States she uses the Atarax for itching.  She doesn't use it everyday.  States she feels the itching is related to her blood sugar, so she only has to take it once in awhile.  She takes Maxalt occasionally for migraine headaches.  Patient states she gets nauseous and vomits often.  She states she is going to speak to her endocrinologist about this.  She thinks this may be due to her insulin.  Patient would like a flu shot today.  Health Maintenance Due  Topic Date Due  . Hepatitis C Screening  August 15, 1945  . OPHTHALMOLOGY EXAM  04/20/1955  . URINE MICROALBUMIN  04/20/1955  . TETANUS/TDAP  04/19/1964  . MAMMOGRAM  04/20/1995  . DEXA SCAN  04/20/2010  . PNA vac Low Risk Adult (1 of 2 - PCV13) 04/20/2010  . COLONOSCOPY  04/26/2016   No flowsheet data found.   PMHx, SurgHx, SocialHx, FamHx, Medications, and  Allergies were reviewed in the Visit Navigator and updated as appropriate.   Patient Active Problem List   Diagnosis Date Noted  . Acute on chronic diastolic CHF (congestive heart failure) ([place]) 09/09/2016  . Elevated troponin 09/09/2016  . Migraine 08/20/2016  . Morbid obesity ([place]) 08/20/2016  . Anxiety 10/02/2015  . DOE (dyspnea on exertion) 10/02/2015  . Dyslipidemia 10/02/2015  . Nonrheumatic aortic valve stenosis 10/02/2015  . Old MI (myocardial infarction) 10/02/2015  . Seizure ([place]) 10/02/2015  . Insomnia 11/03/2014  . Weakness generalized 08/28/2014  . Seizure disorder ([place]) 08/28/2014  . Diabetes mellitus without complication ([place]) 71/24/5809  . Coronary artery disease of native artery of native heart with stable angina pectoris ([place]) 06/02/2010  . Depression 05/20/2007  . Essential hypertension 05/20/2007  . Diaphragmatic hernia 05/20/2007  . Diverticulosis of colon 05/20/2007  . IBS 05/20/2007  . Fatty liver 05/20/2007  . Hypothyroidism 05/20/2007  . Hx of transient ischemic attack (TIA) 05/20/2007   Social History  Substance Use Topics  . Smoking status: Former Smoker    Quit date: 06/02/1970  . Smokeless tobacco: Never Used  . Alcohol use No   Current Medications and Allergies:   Current Outpatient Prescriptions:  .  acetaminophen (TYLENOL) 325 MG tablet, Take 2 tablets (650 mg  total) by mouth every 4 (four) hours as needed for headache or mild pain., Disp: 30 tablet, Rfl: 0 .  albuterol (PROVENTIL HFA;VENTOLIN HFA) 108 (90 Base) MCG/ACT inhaler, Inhale 2 puffs into the lungs every 6 (six) hours as needed for wheezing or shortness of breath. , Disp: , Rfl:  .  ALPRAZolam (XANAX) 0.5 MG tablet, Take 1 tablet (0.5 mg total) by mouth 2 (two) times daily., Disp: 60 tablet, Rfl: 2 .  aspirin EC 325 MG EC tablet, Take 1 tablet (325 mg total) by mouth daily., Disp: 30 tablet, Rfl: 0 .  furosemide (LASIX) 40 MG tablet, Take 20 mg by mouth daily. , Disp: , Rfl:  .   hydrOXYzine (ATARAX/VISTARIL) 10 MG tablet, Take 1 tablet (10 mg total) by mouth 2 (two) times daily as needed for itching., Disp: 30 tablet, Rfl: 5 .  insulin aspart (NOVOLOG) 100 UNIT/ML injection, Inject 0-9 Units into the skin 3 (three) times daily with meals. Sliding scale CBG 70 - 120: 0 units CBG 121 - 150: 1 unit,  CBG 151 - 200: 2 units,  CBG 201 - 250: 3 units,  CBG 251 - 300: 5 units,  CBG 301 - 350: 7 units,  CBG 351 - 400: 9 units   CBG > 400: 9 units and notify your MD, Disp: 10 mL, Rfl: 11 .  insulin detemir (LEVEMIR) 100 UNIT/ML injection, Inject 0.1 mLs (10 Units total) into the skin at bedtime., Disp: 10 mL, Rfl: 11 .  isosorbide mononitrate (IMDUR) 60 MG 24 hr tablet, Take 60 mg by mouth daily. , Disp: , Rfl:  .  levETIRAcetam (KEPPRA) 1000 MG tablet, Take 1 tablet (1,000 mg total) by mouth 2 (two) times daily., Disp: 60 tablet, Rfl: 0 .  metoprolol (LOPRESSOR) 50 MG tablet, Take 50 mg by mouth 2 (two) times daily. , Disp: , Rfl: 0 .  nitroGLYCERIN (NITROSTAT) 0.4 MG SL tablet, Place 0.4 mg under the tongue every 5 (five) minutes as needed for chest pain., Disp: , Rfl:  .  potassium chloride SA (K-DUR,KLOR-CON) 20 MEQ tablet, Take 20 mEq by mouth daily., Disp: , Rfl:  .  prasugrel (EFFIENT) 10 MG TABS tablet, Take 10 mg by mouth daily., Disp: , Rfl:  .  rizatriptan (MAXALT) 10 MG tablet, Take 1 tablet (10 mg total) by mouth as needed for migraine. May repeat in 2 hours if needed, Disp: 10 tablet, Rfl: 5 .  rosuvastatin (CRESTOR) 10 MG tablet, Take 1 tablet (10 mg total) by mouth daily., Disp: 90 tablet, Rfl: 3 .  sertraline (ZOLOFT) 100 MG tablet, Take 1 tablet (100 mg total) by mouth every morning., Disp: 90 tablet, Rfl: 3  Allergies  Allergen Reactions  . Codeine Itching   Review of Systems   Pertinent items are noted in the HPI. Otherwise, ROS is negative.  Vitals:   Vitals:   09/22/16 1130  BP: 116/74  Pulse: 67  Temp: 98.4 F (36.9 C)  TempSrc: Oral  SpO2: 98%    Weight: 239 lb (108.4 kg)  Height: 5' (1.524 m)     Body mass index is 46.68 kg/m.   Physical Exam:   Physical Exam  Constitutional: She is oriented to person, place, and time. She appears well-developed and well-nourished.  Wearing nasal canula.   HENT:  Head: Normocephalic and atraumatic.  Right Ear: External ear normal.  Left Ear: External ear normal.  Nose: Nose normal.  Mouth/Throat: Oropharynx is clear and moist.  Eyes: Pupils are equal, round,  and reactive to light. Conjunctivae and EOM are normal.  Neck: Normal range of motion. Neck supple.  Cardiovascular: Normal rate, regular rhythm and intact distal pulses.   Pulmonary/Chest: Effort normal. She has decreased breath sounds.  Abdominal: Soft. Bowel sounds are normal.  Neurological: She is alert and oriented to person, place, and time.  Skin: Skin is warm. Capillary refill takes less than 2 seconds.  Psychiatric: She has a normal mood and affect. Her behavior is normal.  Nursing note and vitals reviewed.   Results for orders placed or performed during the hospital encounter of 09/09/16  Troponin I  Result Value Ref Range   Troponin I 0.07 (HH) <0.03 ng/mL  Brain natriuretic peptide  Result Value Ref Range   B Natriuretic Peptide 847.5 (H) 0.0 - 100.0 pg/mL  CBC  Result Value Ref Range   WBC 7.3 4.0 - 10.5 K/uL   RBC 3.54 (L) 3.87 - 5.11 MIL/uL   Hemoglobin 10.7 (L) 12.0 - 15.0 g/dL   HCT 33.7 (L) 36.0 - 46.0 %   MCV 95.2 78.0 - 100.0 fL   MCH 30.2 26.0 - 34.0 pg   MCHC 31.8 30.0 - 36.0 g/dL   RDW 15.0 11.5 - 15.5 %   Platelets 230 150 - 400 K/uL  Basic metabolic panel  Result Value Ref Range   Sodium 141 135 - 145 mmol/L   Potassium 3.9 3.5 - 5.1 mmol/L   Chloride 110 101 - 111 mmol/L   CO2 23 22 - 32 mmol/L   Glucose, Bld 264 (H) 65 - 99 mg/dL   BUN 24 (H) 6 - 20 mg/dL   Creatinine, Ser 0.87 0.44 - 1.00 mg/dL   Calcium 8.6 (L) 8.9 - 10.3 mg/dL   GFR calc non Af Amer >60 >60 mL/min   GFR calc Af Amer  >60 >60 mL/min   Anion gap 8 5 - 15  Hemoglobin A1c  Result Value Ref Range   Hgb A1c MFr Bld 8.0 (H) 4.8 - 5.6 %   Mean Plasma Glucose 182.9 mg/dL  Lipid panel  Result Value Ref Range   Cholesterol 172 0 - 200 mg/dL   Triglycerides 91 <150 mg/dL   HDL 41 >40 mg/dL   Total CHOL/HDL Ratio 4.2 RATIO   VLDL 18 0 - 40 mg/dL   LDL Cholesterol 113 (H) 0 - 99 mg/dL  Troponin I (q 6hr x 3)  Result Value Ref Range   Troponin I 0.07 (HH) <0.03 ng/mL  Troponin I (q 6hr x 3)  Result Value Ref Range   Troponin I 0.07 (HH) <0.03 ng/mL  Basic metabolic panel  Result Value Ref Range   Sodium 140 135 - 145 mmol/L   Potassium 3.9 3.5 - 5.1 mmol/L   Chloride 107 101 - 111 mmol/L   CO2 25 22 - 32 mmol/L   Glucose, Bld 238 (H) 65 - 99 mg/dL   BUN 24 (H) 6 - 20 mg/dL   Creatinine, Ser 0.95 0.44 - 1.00 mg/dL   Calcium 8.5 (L) 8.9 - 10.3 mg/dL   GFR calc non Af Amer 59 (L) >60 mL/min   GFR calc Af Amer >60 >60 mL/min   Anion gap 8 5 - 15  Glucose, capillary  Result Value Ref Range   Glucose-Capillary 236 (H) 65 - 99 mg/dL   Comment 1 Notify RN    Comment 2 Document in Chart   Glucose, capillary  Result Value Ref Range   Glucose-Capillary 144 (H) 65 - 99 mg/dL  Comment 1 Notify RN   Glucose, capillary  Result Value Ref Range   Glucose-Capillary 140 (H) 65 - 99 mg/dL   Comment 1 Notify RN   Glucose, capillary  Result Value Ref Range   Glucose-Capillary 185 (H) 65 - 99 mg/dL   Comment 1 Notify RN   Basic metabolic panel  Result Value Ref Range   Sodium 140 135 - 145 mmol/L   Potassium 4.8 3.5 - 5.1 mmol/L   Chloride 106 101 - 111 mmol/L   CO2 25 22 - 32 mmol/L   Glucose, Bld 169 (H) 65 - 99 mg/dL   BUN 30 (H) 6 - 20 mg/dL   Creatinine, Ser 1.12 (H) 0.44 - 1.00 mg/dL   Calcium 8.9 8.9 - 10.3 mg/dL   GFR calc non Af Amer 48 (L) >60 mL/min   GFR calc Af Amer 56 (L) >60 mL/min   Anion gap 9 5 - 15  Magnesium  Result Value Ref Range   Magnesium 1.7 1.7 - 2.4 mg/dL  Glucose,  capillary  Result Value Ref Range   Glucose-Capillary 223 (H) 65 - 99 mg/dL   Comment 1 Notify RN    Comment 2 Document in Chart   Glucose, capillary  Result Value Ref Range   Glucose-Capillary 176 (H) 65 - 99 mg/dL   Comment 1 Notify RN   Glucose, capillary  Result Value Ref Range   Glucose-Capillary 180 (H) 65 - 99 mg/dL   Comment 1 Notify RN   Glucose, capillary  Result Value Ref Range   Glucose-Capillary 210 (H) 65 - 99 mg/dL   Comment 1 Notify RN   Basic metabolic panel  Result Value Ref Range   Sodium 139 135 - 145 mmol/L   Potassium 4.3 3.5 - 5.1 mmol/L   Chloride 105 101 - 111 mmol/L   CO2 26 22 - 32 mmol/L   Glucose, Bld 226 (H) 65 - 99 mg/dL   BUN 41 (H) 6 - 20 mg/dL   Creatinine, Ser 1.16 (H) 0.44 - 1.00 mg/dL   Calcium 8.5 (L) 8.9 - 10.3 mg/dL   GFR calc non Af Amer 46 (L) >60 mL/min   GFR calc Af Amer 54 (L) >60 mL/min   Anion gap 8 5 - 15  CBC  Result Value Ref Range   WBC 7.7 4.0 - 10.5 K/uL   RBC 3.35 (L) 3.87 - 5.11 MIL/uL   Hemoglobin 10.0 (L) 12.0 - 15.0 g/dL   HCT 32.0 (L) 36.0 - 46.0 %   MCV 95.5 78.0 - 100.0 fL   MCH 29.9 26.0 - 34.0 pg   MCHC 31.3 30.0 - 36.0 g/dL   RDW 14.7 11.5 - 15.5 %   Platelets 243 150 - 400 K/uL  Glucose, capillary  Result Value Ref Range   Glucose-Capillary 214 (H) 65 - 99 mg/dL   Comment 1 Notify RN    Comment 2 Document in Chart   Glucose, capillary  Result Value Ref Range   Glucose-Capillary 196 (H) 65 - 99 mg/dL  Glucose, capillary  Result Value Ref Range   Glucose-Capillary 169 (H) 65 - 99 mg/dL  POC CBG, ED  Result Value Ref Range   Glucose-Capillary 258 (H) 65 - 99 mg/dL  ECHOCARDIOGRAM COMPLETE  Result Value Ref Range   Weight 4,011.2 oz   Height 60 in   BP 137/59 mmHg   Assessment and Plan:   Reisha was seen today for follow-up and medication refill.  Diagnoses and all orders for this  visit:  Hospital discharge follow-up Comments: Hospital course reviewed with patient. Medications reviewed.  Follow up list completed. She will be following upo with Endocrinology and Cardiology as well. [place] paperwork completed.   Encounter for immunization -     Flu vaccine HIGH DOSE PF  Morbid obesity ([place]) Comments: The patient is asked to make an attempt to improve diet and exercise patterns to aid in medical management of this problem.   Dysthymia Comments: We reviewed the importance of taking Zoloft regularly. Orders: -     sertraline (ZOLOFT) 100 MG tablet; Take 1 tablet (100 mg total) by mouth every morning.  Anxiety Comments: We discussed options for treatment of anxiety including therapy and/or medication. Discussed potential risks, expected benefits, possible side effects of the medicine. We also discussed how to take it correctly and dosing instructions.  Orders: -     ALPRAZolam (XANAX) 0.5 MG tablet; Take 1 tablet (0.5 mg total) by mouth 2 (two) times daily.  Diabetes mellitus without complication ([place]) Comments: Glucometer Rx. Encouraged patient to follow up with Endocrinology.  Acute on chronic diastolic CHF (congestive heart failure) ([place]) Comments: Still on O2 but feeling much improving. Weight improving per patient. Taking daily weights. She will follow up with Cardiology this month. Orders: -     rosuvastatin (CRESTOR) 10 MG tablet; Take 1 tablet (10 mg total) by mouth daily.   . Reviewed expectations re: course of current medical issues. . Discussed self-management of symptoms. . Outlined signs and symptoms indicating need for more acute intervention. . Patient verbalized understanding and all questions were answered. Marland Kitchen Health Maintenance issues including appropriate healthy diet, exercise, and smoking avoidance were discussed with patient. . See orders for this visit as documented in the electronic medical record. . Patient received an After Visit Summary.  CMA served as Education administrator during this visit. History, Physical, and Plan performed by medical provider. The  above documentation has been reviewed and is accurate and complete. Briscoe Deutscher, D.O.  Briscoe Deutscher, DO [place] [place] 10/03/2016  Future Appointments Date Time Provider Marks  10/11/2016 10:15 AM Renato Shin, MD [facility] None  11/22/2016 9:45 AM Briscoe Deutscher, DO [facility] None

## 2016-09-23 DIAGNOSIS — G40909 Epilepsy, unspecified, not intractable, without status epilepticus: Secondary | ICD-10-CM | POA: Diagnosis not present

## 2016-09-23 DIAGNOSIS — I2510 Atherosclerotic heart disease of native coronary artery without angina pectoris: Secondary | ICD-10-CM | POA: Diagnosis not present

## 2016-09-23 DIAGNOSIS — K219 Gastro-esophageal reflux disease without esophagitis: Secondary | ICD-10-CM | POA: Diagnosis not present

## 2016-09-23 DIAGNOSIS — I5033 Acute on chronic diastolic (congestive) heart failure: Secondary | ICD-10-CM | POA: Diagnosis not present

## 2016-09-23 DIAGNOSIS — E119 Type 2 diabetes mellitus without complications: Secondary | ICD-10-CM | POA: Diagnosis not present

## 2016-09-23 DIAGNOSIS — I110 Hypertensive heart disease with heart failure: Secondary | ICD-10-CM | POA: Diagnosis not present

## 2016-09-24 DIAGNOSIS — E119 Type 2 diabetes mellitus without complications: Secondary | ICD-10-CM | POA: Diagnosis not present

## 2016-09-24 DIAGNOSIS — G40909 Epilepsy, unspecified, not intractable, without status epilepticus: Secondary | ICD-10-CM | POA: Diagnosis not present

## 2016-09-24 DIAGNOSIS — I110 Hypertensive heart disease with heart failure: Secondary | ICD-10-CM | POA: Diagnosis not present

## 2016-09-24 DIAGNOSIS — I2510 Atherosclerotic heart disease of native coronary artery without angina pectoris: Secondary | ICD-10-CM | POA: Diagnosis not present

## 2016-09-24 DIAGNOSIS — I5033 Acute on chronic diastolic (congestive) heart failure: Secondary | ICD-10-CM | POA: Diagnosis not present

## 2016-09-24 DIAGNOSIS — K219 Gastro-esophageal reflux disease without esophagitis: Secondary | ICD-10-CM | POA: Diagnosis not present

## 2016-09-28 ENCOUNTER — Telehealth: Payer: Self-pay

## 2016-09-28 DIAGNOSIS — K219 Gastro-esophageal reflux disease without esophagitis: Secondary | ICD-10-CM | POA: Diagnosis not present

## 2016-09-28 DIAGNOSIS — E119 Type 2 diabetes mellitus without complications: Secondary | ICD-10-CM | POA: Diagnosis not present

## 2016-09-28 DIAGNOSIS — G40909 Epilepsy, unspecified, not intractable, without status epilepticus: Secondary | ICD-10-CM | POA: Diagnosis not present

## 2016-09-28 DIAGNOSIS — I2510 Atherosclerotic heart disease of native coronary artery without angina pectoris: Secondary | ICD-10-CM | POA: Diagnosis not present

## 2016-09-28 DIAGNOSIS — I110 Hypertensive heart disease with heart failure: Secondary | ICD-10-CM | POA: Diagnosis not present

## 2016-09-28 DIAGNOSIS — I5033 Acute on chronic diastolic (congestive) heart failure: Secondary | ICD-10-CM | POA: Diagnosis not present

## 2016-09-28 NOTE — Telephone Encounter (Signed)
PA approved.  Approval faxed to patient's pharmacy.

## 2016-09-28 NOTE — Telephone Encounter (Signed)
Hydroxyzine PA initiated through Cover My Meds.  Waiting for decision.

## 2016-09-30 DIAGNOSIS — I110 Hypertensive heart disease with heart failure: Secondary | ICD-10-CM | POA: Diagnosis not present

## 2016-09-30 DIAGNOSIS — I2510 Atherosclerotic heart disease of native coronary artery without angina pectoris: Secondary | ICD-10-CM | POA: Diagnosis not present

## 2016-09-30 DIAGNOSIS — I5033 Acute on chronic diastolic (congestive) heart failure: Secondary | ICD-10-CM | POA: Diagnosis not present

## 2016-09-30 DIAGNOSIS — G40909 Epilepsy, unspecified, not intractable, without status epilepticus: Secondary | ICD-10-CM | POA: Diagnosis not present

## 2016-09-30 DIAGNOSIS — E119 Type 2 diabetes mellitus without complications: Secondary | ICD-10-CM | POA: Diagnosis not present

## 2016-09-30 DIAGNOSIS — K219 Gastro-esophageal reflux disease without esophagitis: Secondary | ICD-10-CM | POA: Diagnosis not present

## 2016-10-01 DIAGNOSIS — I110 Hypertensive heart disease with heart failure: Secondary | ICD-10-CM | POA: Diagnosis not present

## 2016-10-01 DIAGNOSIS — I5033 Acute on chronic diastolic (congestive) heart failure: Secondary | ICD-10-CM | POA: Diagnosis not present

## 2016-10-01 DIAGNOSIS — I2510 Atherosclerotic heart disease of native coronary artery without angina pectoris: Secondary | ICD-10-CM | POA: Diagnosis not present

## 2016-10-01 DIAGNOSIS — K219 Gastro-esophageal reflux disease without esophagitis: Secondary | ICD-10-CM | POA: Diagnosis not present

## 2016-10-01 DIAGNOSIS — E119 Type 2 diabetes mellitus without complications: Secondary | ICD-10-CM | POA: Diagnosis not present

## 2016-10-01 DIAGNOSIS — G40909 Epilepsy, unspecified, not intractable, without status epilepticus: Secondary | ICD-10-CM | POA: Diagnosis not present

## 2016-10-04 ENCOUNTER — Telehealth: Payer: Self-pay | Admitting: Family Medicine

## 2016-10-04 NOTE — Telephone Encounter (Signed)
Patient's home health nurse Tharon Aquas called to get verbal orders for the patient to continue to have home health for 2x a week for 2 more weeks. Call the number provided to advise.

## 2016-10-04 NOTE — Telephone Encounter (Signed)
Verbal orders left on voicemail. 

## 2016-10-06 DIAGNOSIS — I25119 Atherosclerotic heart disease of native coronary artery with unspecified angina pectoris: Secondary | ICD-10-CM | POA: Diagnosis not present

## 2016-10-06 DIAGNOSIS — I252 Old myocardial infarction: Secondary | ICD-10-CM

## 2016-10-06 DIAGNOSIS — I519 Heart disease, unspecified: Secondary | ICD-10-CM | POA: Insufficient documentation

## 2016-10-06 DIAGNOSIS — I10 Essential (primary) hypertension: Secondary | ICD-10-CM | POA: Diagnosis not present

## 2016-10-06 DIAGNOSIS — I2510 Atherosclerotic heart disease of native coronary artery without angina pectoris: Secondary | ICD-10-CM | POA: Insufficient documentation

## 2016-10-06 DIAGNOSIS — E119 Type 2 diabetes mellitus without complications: Secondary | ICD-10-CM | POA: Diagnosis not present

## 2016-10-06 DIAGNOSIS — I350 Nonrheumatic aortic (valve) stenosis: Secondary | ICD-10-CM | POA: Diagnosis not present

## 2016-10-08 ENCOUNTER — Other Ambulatory Visit: Payer: Self-pay

## 2016-10-08 ENCOUNTER — Telehealth: Payer: Self-pay | Admitting: Endocrinology

## 2016-10-08 MED ORDER — INSULIN DETEMIR 100 UNIT/ML FLEXPEN: PEN_INJECTOR | SUBCUTANEOUS | 11 refills | Status: DC

## 2016-10-08 NOTE — Telephone Encounter (Signed)
Called and left patient VM that I sent in Levemir pens.

## 2016-10-08 NOTE — Telephone Encounter (Signed)
Pt is in need of a refill on the levemir pen not a vial please

## 2016-10-11 ENCOUNTER — Ambulatory Visit: Payer: Medicare Other | Admitting: Endocrinology

## 2016-10-12 DIAGNOSIS — G40909 Epilepsy, unspecified, not intractable, without status epilepticus: Secondary | ICD-10-CM | POA: Diagnosis not present

## 2016-10-12 DIAGNOSIS — K219 Gastro-esophageal reflux disease without esophagitis: Secondary | ICD-10-CM | POA: Diagnosis not present

## 2016-10-12 DIAGNOSIS — I5033 Acute on chronic diastolic (congestive) heart failure: Secondary | ICD-10-CM | POA: Diagnosis not present

## 2016-10-12 DIAGNOSIS — I2510 Atherosclerotic heart disease of native coronary artery without angina pectoris: Secondary | ICD-10-CM | POA: Diagnosis not present

## 2016-10-12 DIAGNOSIS — I110 Hypertensive heart disease with heart failure: Secondary | ICD-10-CM | POA: Diagnosis not present

## 2016-10-12 DIAGNOSIS — E119 Type 2 diabetes mellitus without complications: Secondary | ICD-10-CM | POA: Diagnosis not present

## 2016-10-27 ENCOUNTER — Ambulatory Visit: Payer: Medicare Other | Admitting: Endocrinology

## 2016-10-27 DIAGNOSIS — Z0289 Encounter for other administrative examinations: Secondary | ICD-10-CM

## 2016-10-28 ENCOUNTER — Telehealth: Payer: Self-pay | Admitting: Family Medicine

## 2016-10-28 NOTE — Telephone Encounter (Signed)
Nurse wanted to let Dr. Juleen China know that patient cancelled home care this week twice and has not rescheduled. [phone]

## 2016-10-29 NOTE — Telephone Encounter (Signed)
FYI

## 2016-11-11 DIAGNOSIS — K219 Gastro-esophageal reflux disease without esophagitis: Secondary | ICD-10-CM | POA: Diagnosis not present

## 2016-11-11 DIAGNOSIS — E119 Type 2 diabetes mellitus without complications: Secondary | ICD-10-CM | POA: Diagnosis not present

## 2016-11-11 DIAGNOSIS — I5033 Acute on chronic diastolic (congestive) heart failure: Secondary | ICD-10-CM | POA: Diagnosis not present

## 2016-11-11 DIAGNOSIS — G40909 Epilepsy, unspecified, not intractable, without status epilepticus: Secondary | ICD-10-CM | POA: Diagnosis not present

## 2016-11-11 DIAGNOSIS — I110 Hypertensive heart disease with heart failure: Secondary | ICD-10-CM | POA: Diagnosis not present

## 2016-11-11 DIAGNOSIS — I2510 Atherosclerotic heart disease of native coronary artery without angina pectoris: Secondary | ICD-10-CM | POA: Diagnosis not present

## 2016-11-22 ENCOUNTER — Ambulatory Visit (INDEPENDENT_AMBULATORY_CARE_PROVIDER_SITE_OTHER): Payer: Medicare Other | Admitting: Family Medicine

## 2016-11-22 ENCOUNTER — Encounter: Payer: Self-pay | Admitting: Family Medicine

## 2016-11-22 VITALS — BP 138/78 | HR 85 | Temp 97.7°F | Ht 60.0 in | Wt 241.4 lb

## 2016-11-22 DIAGNOSIS — I10 Essential (primary) hypertension: Secondary | ICD-10-CM | POA: Diagnosis not present

## 2016-11-22 DIAGNOSIS — E119 Type 2 diabetes mellitus without complications: Secondary | ICD-10-CM

## 2016-11-22 DIAGNOSIS — I209 Angina pectoris, unspecified: Secondary | ICD-10-CM | POA: Diagnosis not present

## 2016-11-22 DIAGNOSIS — R569 Unspecified convulsions: Secondary | ICD-10-CM

## 2016-11-22 DIAGNOSIS — I25118 Atherosclerotic heart disease of native coronary artery with other forms of angina pectoris: Secondary | ICD-10-CM

## 2016-11-22 DIAGNOSIS — D649 Anemia, unspecified: Secondary | ICD-10-CM

## 2016-11-22 DIAGNOSIS — R944 Abnormal results of kidney function studies: Secondary | ICD-10-CM

## 2016-11-22 DIAGNOSIS — M810 Age-related osteoporosis without current pathological fracture: Secondary | ICD-10-CM | POA: Diagnosis not present

## 2016-11-22 DIAGNOSIS — K760 Fatty (change of) liver, not elsewhere classified: Secondary | ICD-10-CM

## 2016-11-22 DIAGNOSIS — I5033 Acute on chronic diastolic (congestive) heart failure: Secondary | ICD-10-CM

## 2016-11-22 DIAGNOSIS — F419 Anxiety disorder, unspecified: Secondary | ICD-10-CM | POA: Diagnosis not present

## 2016-11-22 LAB — COMPREHENSIVE METABOLIC PANEL
ALT: 10 U/L (ref 0–35)
AST: 19 U/L (ref 0–37)
Albumin: 3.6 g/dL (ref 3.5–5.2)
Alkaline Phosphatase: 79 U/L (ref 39–117)
BUN: 18 mg/dL (ref 6–23)
CO2: 27 mEq/L (ref 19–32)
Calcium: 9 mg/dL (ref 8.4–10.5)
Chloride: 104 mEq/L (ref 96–112)
Creatinine, Ser: 1.07 mg/dL (ref 0.40–1.20)
GFR: 53.64 mL/min — ABNORMAL LOW (ref >60.00)
Glucose, Bld: 123 mg/dL — ABNORMAL HIGH (ref 70–99)
Potassium: 3.7 mEq/L (ref 3.5–5.1)
Sodium: 143 mEq/L (ref 135–145)
Total Bilirubin: 0.4 mg/dL (ref 0.2–1.2)
Total Protein: 6.7 g/dL (ref 6.0–8.3)

## 2016-11-22 LAB — CBC WITH DIFFERENTIAL/PLATELET
Basophils Absolute: 0 10*3/uL (ref 0.0–0.1)
Basophils Relative: 0.4 % (ref 0.0–3.0)
Eosinophils Absolute: 0.1 10*3/uL (ref 0.0–0.7)
Eosinophils Relative: 1.1 % (ref 0.0–5.0)
HCT: 35.1 % — ABNORMAL LOW (ref 36.0–46.0)
Hemoglobin: 11.3 g/dL — ABNORMAL LOW (ref 12.0–15.0)
Lymphocytes Relative: 15.2 % (ref 12.0–46.0)
Lymphs Abs: 1.8 10*3/uL (ref 0.7–4.0)
MCHC: 32.2 g/dL (ref 30.0–36.0)
MCV: 97.7 fl (ref 78.0–100.0)
Monocytes Absolute: 0.6 10*3/uL (ref 0.1–1.0)
Monocytes Relative: 5.4 % (ref 3.0–12.0)
Neutro Abs: 9.1 10*3/uL — ABNORMAL HIGH (ref 1.4–7.7)
Neutrophils Relative %: 77.9 % — ABNORMAL HIGH (ref 43.0–77.0)
Platelets: 325 10*3/uL (ref 150.0–400.0)
RBC: 3.6 Mil/uL — ABNORMAL LOW (ref 3.87–5.11)
RDW: 15.9 % — ABNORMAL HIGH (ref 11.5–15.5)
WBC: 11.7 10*3/uL — ABNORMAL HIGH (ref 4.0–10.5)

## 2016-11-22 MED ORDER — ALPRAZOLAM 0.5 MG PO TABS: 0.5000 mg | ORAL_TABLET | Freq: Two times a day (BID) | ORAL | 2 refills | Status: DC

## 2016-11-22 NOTE — Progress Notes (Addendum)
[name] is a 71 y.o. female is here for follow up.  History of Present Illness:   HPI: See Assessment and Plan section for Problem Based Charting of issues discussed today.  Health Maintenance Due  Topic Date Due  . OPHTHALMOLOGY EXAM  04/20/1955  . URINE MICROALBUMIN  04/20/1955  . TETANUS/TDAP  04/19/1964  . MAMMOGRAM  04/20/1995  . DEXA SCAN  04/20/2010  . PNA vac Low Risk Adult (1 of 2 - PCV13) 04/20/2010  . COLONOSCOPY  04/26/2016   PMHx, SurgHx, SocialHx, FamHx, Medications, and Allergies were reviewed in the Visit Navigator and updated as appropriate.   Patient Active Problem List   Diagnosis Date Noted  . Acute on chronic diastolic CHF (congestive heart failure) ([place]) 09/09/2016  . Elevated troponin 09/09/2016  . Migraine 08/20/2016  . Morbid obesity ([place]) 08/20/2016  . Anxiety 10/02/2015  . DOE (dyspnea on exertion) 10/02/2015  . Dyslipidemia 10/02/2015  . Nonrheumatic aortic valve stenosis 10/02/2015  . Old MI (myocardial infarction) 10/02/2015  . Seizure ([place]) 10/02/2015  . Insomnia 11/03/2014  . Weakness generalized 08/28/2014  . Seizure disorder ([place]) 08/28/2014  . Diabetes mellitus without complication ([place]) 60/04/5995  . Coronary artery disease of native artery of native heart with stable angina pectoris ([place]) 06/02/2010  . Depression 05/20/2007  . Essential hypertension 05/20/2007  . Diaphragmatic hernia 05/20/2007  . Diverticulosis of colon 05/20/2007  . IBS 05/20/2007  . Fatty liver 05/20/2007  . Hypothyroidism 05/20/2007  . Hx of transient ischemic attack (TIA) 05/20/2007   Social History   Tobacco Use  . Smoking status: Former Smoker    Last attempt to quit: 06/02/1970    Years since quitting: 46.5  . Smokeless tobacco: Never Used  Substance Use Topics  . Alcohol use: No  . Drug use: No   Current Medications and Allergies:   .  acetaminophen (TYLENOL) 325 MG tablet, Take 2 tablets (650 mg total) by mouth every 4 (four) hours as  needed for headache or mild pain., Disp: 30 tablet, Rfl: 0 .  albuterol (PROVENTIL HFA;VENTOLIN HFA) 108 (90 Base) MCG/ACT inhaler, Inhale 2 puffs into the lungs every 6 (six) hours as needed for wheezing or shortness of breath. , Disp: , Rfl:  .  ALPRAZolam (XANAX) 0.5 MG tablet, Take 1 tablet (0.5 mg total) by mouth 2 (two) times daily., Disp: 60 tablet, Rfl: 2 .  aspirin EC 325 MG EC tablet, Take 1 tablet (325 mg total) by mouth daily., Disp: 30 tablet, Rfl: 0 .  furosemide (LASIX) 40 MG tablet, Take 20 mg by mouth daily. , Disp: , Rfl:  .  hydrOXYzine (ATARAX/VISTARIL) 10 MG tablet, Take 1 tablet (10 mg total) by mouth 2 (two) times daily as needed for itching., Disp: 30 tablet, Rfl: 5 .  insulin aspart (NOVOLOG) 100 UNIT/ML injection, Inject 0-9 Units into the skin 3 (three) times daily with meals. Sliding scale CBG 70 - 120: 0 units CBG 121 - 150: 1 unit,  CBG 151 - 200: 2 units,  CBG 201 - 250: 3 units,  CBG 251 - 300: 5 units,  CBG 301 - 350: 7 units,  CBG 351 - 400: 9 units   CBG > 400: 9 units and notify your MD, Disp: 10 mL, Rfl: 11 .  Insulin Detemir (LEVEMIR FLEXPEN) 100 UNIT/ML Pen, Used to inject 10 units of insulin subcutaneously daly at bedtime, Disp: 5 pen, Rfl: 11 .  insulin detemir (LEVEMIR) 100 UNIT/ML injection, Inject 0.1 mLs (10 Units total)  into the skin at bedtime., Disp: 10 mL, Rfl: 11 .  isosorbide mononitrate (IMDUR) 60 MG 24 hr tablet, Take 60 mg by mouth daily. , Disp: , Rfl:  .  levETIRAcetam (KEPPRA) 1000 MG tablet, Take 1 tablet (1,000 mg total) by mouth 2 (two) times daily., Disp: 60 tablet, Rfl: 0 .  metoprolol (LOPRESSOR) 50 MG tablet, Take 50 mg by mouth 2 (two) times daily. , Disp: , Rfl: 0 .  nitroGLYCERIN (NITROSTAT) 0.4 MG SL tablet, Place 0.4 mg under the tongue every 5 (five) minutes as needed for chest pain., Disp: , Rfl:  .  potassium chloride SA (K-DUR,KLOR-CON) 20 MEQ tablet, Take 20 mEq by mouth daily., Disp: , Rfl:  .  prasugrel (EFFIENT) 10 MG TABS  tablet, Take 10 mg by mouth daily., Disp: , Rfl:  .  rizatriptan (MAXALT) 10 MG tablet, Take 1 tablet (10 mg total) by mouth as needed for migraine. May repeat in 2 hours if needed, Disp: 10 tablet, Rfl: 5 .  rosuvastatin (CRESTOR) 10 MG tablet, Take 1 tablet (10 mg total) by mouth daily., Disp: 90 tablet, Rfl: 3 .  sertraline (ZOLOFT) 100 MG tablet, Take 1 tablet (100 mg total) by mouth every morning., Disp: 90 tablet, Rfl: 3  Allergies  Allergen Reactions  . Codeine Itching   Review of Systems   Pertinent items are noted in the HPI. Otherwise, ROS is negative.  Vitals:   Vitals:   11/22/16 0950  BP: 138/78  Pulse: 85  Temp: 97.7 F (36.5 C)  SpO2: 96%  Weight: 241 lb 6.4 oz (109.5 kg)  Height: 5' (1.524 m)     Body mass index is 47.15 kg/m.   Physical Exam:   Physical Exam  Constitutional: She is oriented to person, place, and time. She appears well-developed and well-nourished.  Wearing nasal canula.   HENT:  Head: Normocephalic and atraumatic.  Right Ear: External ear normal.  Left Ear: External ear normal.  Nose: Nose normal.  Mouth/Throat: Oropharynx is clear and moist.  Eyes: Conjunctivae and EOM are normal. Pupils are equal, round, and reactive to light.  Neck: Normal range of motion. Neck supple.  Cardiovascular: Normal rate, regular rhythm and intact distal pulses.  Pulmonary/Chest: Effort normal. She has decreased breath sounds.  Abdominal: Soft. Bowel sounds are normal.  Neurological: She is alert and oriented to person, place, and time.  Skin: Skin is warm. Capillary refill takes less than 2 seconds.  Psychiatric: She has a normal mood and affect. Her behavior is normal.  Nursing note and vitals reviewed.   Results for orders placed or performed in visit on 11/22/16  CBC with Differential/Platelet  Result Value Ref Range   WBC 11.7 (H) 4.0 - 10.5 K/uL   RBC 3.60 (L) 3.87 - 5.11 Mil/uL   Hemoglobin 11.3 (L) 12.0 - 15.0 g/dL   HCT 35.1 (L) 36.0 - 46.0  %   MCV 97.7 78.0 - 100.0 fl   MCHC 32.2 30.0 - 36.0 g/dL   RDW 15.9 (H) 11.5 - 15.5 %   Platelets 325.0 150.0 - 400.0 K/uL   Neutrophils Relative % 77.9 (H) 43.0 - 77.0 %   Lymphocytes Relative 15.2 12.0 - 46.0 %   Monocytes Relative 5.4 3.0 - 12.0 %   Eosinophils Relative 1.1 0.0 - 5.0 %   Basophils Relative 0.4 0.0 - 3.0 %   Neutro Abs 9.1 (H) 1.4 - 7.7 K/uL   Lymphs Abs 1.8 0.7 - 4.0 K/uL   Monocytes Absolute  0.6 0.1 - 1.0 K/uL   Eosinophils Absolute 0.1 0.0 - 0.7 K/uL   Basophils Absolute 0.0 0.0 - 0.1 K/uL  Comprehensive metabolic panel  Result Value Ref Range   Sodium 143 135 - 145 mEq/L   Potassium 3.7 3.5 - 5.1 mEq/L   Chloride 104 96 - 112 mEq/L   CO2 27 19 - 32 mEq/L   Glucose, Bld 123 (H) 70 - 99 mg/dL   BUN 18 6 - 23 mg/dL   Creatinine, Ser 1.07 0.40 - 1.20 mg/dL   Total Bilirubin 0.4 0.2 - 1.2 mg/dL   Alkaline Phosphatase 79 39 - 117 U/L   AST 19 0 - 37 U/L   ALT 10 0 - 35 U/L   Total Protein 6.7 6.0 - 8.3 g/dL   Albumin 3.6 3.5 - 5.2 g/dL   Calcium 9.0 8.4 - 10.5 mg/dL   GFR 53.64 (L) >60.00 mL/min  Hepatitis C Antibody  Result Value Ref Range   Hepatitis C Ab NON-REACTIVE NON-REACTI   SIGNAL TO CUT-OFF 0.02 <1.00   Assessment and Plan:   [name] was seen today for follow-up.  Diagnoses and all orders for this visit:  Diabetes mellitus without complication ([place]) Comments: Current symptoms: no polyuria or polydipsia, no numbness, tingling or pain in extremities.  Taking medication compliantly without noted sided effects [x]   YES  []   NO  Episodes of hypoglycemia? []   YES  [x]   NO Maintaining a diabetic diet? []   YES  [x]   NO Trying to exercise on a regular basis? []   YES  [x]   NO  On ACE inhibitor or angiotensin II receptor blocker? []   YES  [x]   NO On Aspirin? [x]   YES  []   NO  Lab Results  Component Value Date   HGBA1C 8.0 (H) 09/10/2016    No results found for: Derl Barrow   Lab Results  Component Value Date   CHOL 172 09/10/2016     HDL 41 09/10/2016   LDLCALC 113 (H) 09/10/2016   TRIG 91 09/10/2016   CHOLHDL 4.2 09/10/2016     Wt Readings from Last 3 Encounters:  11/22/16 241 lb 6.4 oz (109.5 kg)  09/22/16 239 lb (108.4 kg)  09/12/16 247 lb 3.2 oz (112.1 kg)   BP Readings from Last 3 Encounters:  11/22/16 138/78  09/22/16 116/74  09/12/16 (!) 147/67   Lab Results  Component Value Date   CREATININE 1.07 11/22/2016   Orders: -     CBC with Differential/Platelet -     Comprehensive metabolic panel -     Cancel: Urine Microalbumin w/creat. ratio -     Urine Microalbumin w/creat. ratio; Future  Fatty liver Comments: Will monitor.  Orders: -     Comprehensive metabolic panel -     Cancel: Urine Microalbumin w/creat. ratio -     Hepatitis C Antibody  Acute on chronic diastolic CHF (congestive heart failure) ([place]) Comments: Patient continues to use oxygen 24/7.  Orders: -     Comprehensive metabolic panel -     Cancel: Urine Microalbumin w/creat. ratio  Essential hypertension Comments: Avoiding excessive salt intake? [x]   YES  []   NO Trying to exercise on a regular basis? []   YES  [x]   NO Review: taking medications as instructed, no medication side effects noted, no TIAs.   Wt Readings from Last 3 Encounters:  11/22/16 241 lb 6.4 oz (109.5 kg)  09/22/16 239 lb (108.4 kg)  09/12/16 247 lb 3.2 oz (112.1 kg)  Reports that she quit smoking about 46 years ago. she has never used smokeless tobacco.  BP Readings from Last 3 Encounters:  11/22/16 138/78  09/22/16 116/74  09/12/16 (!) 147/67   Lab Results  Component Value Date   CREATININE 1.07 11/22/2016   Orders: -     Comprehensive metabolic panel -     Cancel: Urine Microalbumin w/creat. ratio  High risk for fracture due to osteoporosis by DEXA scan -     DG Bone Density; Future  Anxiety Comments: Database reviewed. Risk versus benefit discussed with patient. Okay refill.  Orders: -     ALPRAZolam (XANAX) 0.5 MG tablet; Take 1  tablet (0.5 mg total) 2 (two) times daily by mouth.  Seizures ([place]) Comments: Stable on Keppra.   Morbid obesity ([place]) Comments: The patient is asked to make an attempt to improve diet and exercise patterns to aid in medical management of this problem.   Coronary artery disease of native artery of native heart with stable angina pectoris ([place]) Comments: I spent a significant amount of time talking to the patient today about stenting versus medication management.   Anemia, unspecified type Comments:  CBC Latest Ref Rng & Units 11/22/2016 09/12/2016 09/09/2016  WBC 4.0 - 10.5 K/uL 11.7(H) 7.7 7.3  Hemoglobin 12.0 - 15.0 g/dL 11.3(L) 10.0(L) 10.7(L)  Hematocrit 36.0 - 46.0 % 35.1(L) 32.0(L) 33.7(L)  Platelets 150.0 - 400.0 K/uL 325.0 243 230   Decreased glomerular filtration rate (GFR) Comments:  Lab Results  Component Value Date   CREATININE 1.07 11/22/2016   CREATININE 1.16 (H) 09/12/2016   CREATININE 1.12 (H) 09/11/2016     . Reviewed expectations re: course of current medical issues. . Discussed self-management of symptoms. . Outlined signs and symptoms indicating need for more acute intervention. . Patient verbalized understanding and all questions were answered. Marland Kitchen Health Maintenance issues including appropriate healthy diet, exercise, and smoking avoidance were discussed with patient. . See orders for this visit as documented in the electronic medical record. . Patient received an After Visit Summary.  Briscoe Deutscher, DO [place] 11/24/2016  Future Appointments  Date Time Provider [place]  01/07/2017  9:30 AM [facility] DEXA 1 [facility]  01/21/2017  9:20 AM Briscoe Deutscher, DO [facility] None   Records requested if needed. Time spent with the patient: 60 minutes, of which >50% was spent in obtaining information about her symptoms, reviewing her previous labs, evaluations, and treatments, counseling her about her condition (please see the discussed  topics above), and developing a plan to further investigate it; she had a number of questions which I addressed.

## 2016-11-23 LAB — HEPATITIS C ANTIBODY
Hepatitis C Ab: NONREACTIVE
SIGNAL TO CUT-OFF: 0.02 (ref <1.00)

## 2016-11-24 ENCOUNTER — Encounter: Payer: Self-pay | Admitting: Family Medicine

## 2016-12-17 ENCOUNTER — Ambulatory Visit (INDEPENDENT_AMBULATORY_CARE_PROVIDER_SITE_OTHER): Payer: Medicare Other | Admitting: Family Medicine

## 2016-12-17 ENCOUNTER — Encounter: Payer: Self-pay | Admitting: Family Medicine

## 2016-12-17 VITALS — BP 118/80 | HR 52 | Temp 98.5°F | Ht 60.0 in

## 2016-12-17 DIAGNOSIS — K047 Periapical abscess without sinus: Secondary | ICD-10-CM

## 2016-12-17 MED ORDER — PENICILLIN V POTASSIUM 500 MG PO TABS: 500.0000 mg | ORAL_TABLET | Freq: Four times a day (QID) | ORAL | 0 refills | Status: DC

## 2016-12-17 MED ORDER — HYDROCODONE-ACETAMINOPHEN 5-325 MG PO TABS: 1.0000 | ORAL_TABLET | Freq: Four times a day (QID) | ORAL | 0 refills | Status: DC | PRN

## 2016-12-17 NOTE — Patient Instructions (Signed)
Start the penicillin.  Let us know if not improving.  Take care,  Dr Jerline Pain   Periodontal Disease Periodontal disease, also called gum disease or gingivitis, is inflammation, infection, or both that affects the tissue that surrounds and supports the teeth (periodontal tissue). Periodontal tissue includes the gums, the tissues (ligaments) that hold the teeth in place, and the tooth sockets (alveolar bones). Periodontal disease can affect tissue around one tooth or many teeth. If this condition is not treated, it can cause you to lose a tooth. What are the causes? This condition is usually caused by plaque. Plaque contains harmful bacteria that can cause gums to become swollen and infected. If periodontal disease gets worse (progresses), it can also damage other supporting tissues. Plaque can develop due to poor oral care, such as not brushing teeth enough, not visiting the dentist regularly, or eating and drinking too many sugary foods and beverages. What increases the risk? This condition is more likely to develop in people who:  Smoke.  Use tobacco.  Clench or grind their teeth.  Abuse substances.  Are going through the hormonal changes of puberty, menopause, or pregnancy.  Are under stress.  Are taking certain medicines, such as steroids, antiseizure medicines, or medicines to treat cancer.  Have diabetes.  Have poor nutrition.  Have a disease that interferes with the body's disease-fighting system (immune system).  Have a family history of periodontal disease.  What are the signs or symptoms? Symptoms of this condition include:  Red or swollen gums.  Bad breath that does not go away.  Gums that have pulled away from the teeth.  Gums that bleed easily.  Teeth that are loose or separating.  Pain when chewing.  Changes in the way your teeth fit together.  Sensitive teeth.  How is this diagnosed? This condition is diagnosed with an exam of the tissue around  your teeth. Your health care provider may also take an X-ray of your teeth and ask about your medical history. How is this treated? Treatment for this condition depends on the extent of the disease, which is determined by a dental exam. Treatment may include:  Brushing and flossing regularly.  A deep dental cleaning that involves scraping the buildup of plaque and tartar from below the gum line (scaling and root planing). This may be needed if the disease progresses.  Antibiotic medicines. These may be taken by mouth (orally) or as a rinse.  Surgery, in some severe cases. This may involve a surgery to lift up the gums and remove tartar deposits or to reduce a pocket of periodontal disease (flap surgery). Surgery might also involve procedures that replace damaged areas and help healthy bone and tissue to grow (bone and tissue grafting).  Follow these instructions at home:  Practice good oral hygiene: ? Brush your teeth two times a day with a soft toothbrush. ? Floss between your teeth every day. ? Get regular dental exams.  If you were prescribed antibiotic medicine or a rinse, take or use it as told by your health care provider. Do not stop taking or using the antibiotic even if your condition improves.  Take over-the-counter and prescription medicines only as told by your health care provider.  Do not use any products that contain nicotine or tobacco, such as cigarettes and e-cigarettes. If you need help quitting, ask your health care provider.  Eat a well-balanced diet that includes plenty of vegetables, fruits, whole grains, low-fat dairy products, and lean protein. ? Do not eat  a lot of foods that are high in solid fats, added sugars, or salt. ? Avoid beverages that contain a lot of sugar. Contact a health care provider if:  Your symptoms do not improve. Get help right away if:  You have swelling in your face, neck, or jaw.  You have severe pain that does not get better with  medicine.  You have a fever. Summary  Periodontal disease, also called gum disease or gingivitis, is inflammation, infection, or both that affects the tissue that surrounds and supports the teeth (periodontal tissue).  Practice good oral hygiene. Brush your teeth two times a day with a soft toothbrush. Floss between your teeth every day.  Do not use any products that contain nicotine or tobacco, such as cigarettes and e-cigarettes. If you need help quitting, ask your health care provider.  If you were prescribed antibiotic medicine or a rinse, take or use it as told by your health care provider. Do not stop taking or using the antibiotic even if your condition improves. This information is not intended to replace advice given to you by your health care provider. Make sure you discuss any questions you have with your health care provider. Document Released: 12/24/2002 Document Revised: 10/03/2015 Document Reviewed: 10/03/2015 Elsevier Interactive Patient Education  [employer].

## 2016-12-17 NOTE — Progress Notes (Signed)
    Subjective:  [name] is a 71 y.o. female who presents today with a chief complaint of dental pain.   HPI:  Dental pain, acute issue Started 5 days ago.  Pain is located to her left lower jaw.  Has worsened over the last couple of days.  Associated symptoms include foul smell and taste in her mouth.  She is also had fevers to 101 F.  No treatments tried.  No difficulty swallowing.  She has had some difficulty speaking secondary to pain.  ROS: Per HPI  PMH: Smoking history reviewed.  Former smoker.  Objective:  Physical Exam: BP 118/80 (BP Location: Left Arm, Patient Position: Sitting, Cuff Size: Large)   Pulse (!) 52   Temp 98.5 F (36.9 C)   Ht 5' (1.524 m)   SpO2 97%   BMI 47.15 kg/m   Gen: NAD, resting comfortably Mouth: Very poor dentition noted.  Left lower incisor with surrounding erythema and edema.  Purulent drainage noted.  No signs of Ludwig's angina.   Assessment/Plan:  Dental abscess Afebrile here today.  Start penicillin VK for 10-day course.  Also gave small supply of Norco for pain.  Advised her that she would need dental follow-up to have the teeth pulled.  Discussed return precautions and reasons to seek care emergently including fevers, pain not responding to medication, and difficulty speaking/swallowing.  Follow-up as needed.  Algis Greenhouse. Jerline Pain, MD 12/17/2016 3:37 PM

## 2017-01-07 ENCOUNTER — Other Ambulatory Visit: Payer: Medicare Other

## 2017-01-10 ENCOUNTER — Telehealth: Payer: Self-pay | Admitting: Family Medicine

## 2017-01-10 NOTE — Telephone Encounter (Signed)
Called patient states she has not had any fever in over two days. She states she is only having no productive cough and sore throat now. All symptoms started about two weeks ago but the only thing not getting better is cough and sore throat. She denies any shortness of breath (on 2l oxygen) and any swelling. She has been taking fluid pill and has not seen any change win weight. She declined appointment.

## 2017-01-10 NOTE — Telephone Encounter (Signed)
This will be addressed with an appointment only. Otherwise, go to Urgent Care.

## 2017-01-10 NOTE — Telephone Encounter (Signed)
CRM for notification. See Telephone encounter for:   01/10/17.  Caller name: Relation to pt: self  Call back number: [phone] Pharmacy: [place], [place] - [address] [phone] (Phone) [phone] (Fax)   Reason for call:  Patient states she's too weak to schedule appointment, patient experiencing cough,  hoarse, fever broke over the weekend, patient currently on oxygen, requesting acute Rx, please advise

## 2017-01-10 NOTE — Telephone Encounter (Signed)
Please advise as soon as possible on the note below.

## 2017-01-10 NOTE — Telephone Encounter (Signed)
Called patient reviewed all information. She will go to urgent care if she can get ride. If not she will call our office in am to get appointment.

## 2017-01-20 ENCOUNTER — Telehealth: Payer: Self-pay | Admitting: Family Medicine

## 2017-01-20 NOTE — Telephone Encounter (Signed)
Spoke with patient and let her know that we would have to see her to prescribe anything for her symptoms. I offered her an appointment with a provider today, but she said that she would just wait until tomorrow to be seen.

## 2017-01-20 NOTE — Telephone Encounter (Signed)
Copied from [place] [id number]. Topic: General - Other >> Jan 20, 2017  8:09 AM Lolita Rieger, RMA wrote: Reason for CRM: Pt has appointment tomorrow with Dr. Juleen China pt stated that she has a kidney infection and would like for something to be called in for pain so that she is able to make it to her appointment tomorrow   Please contact pt @[phone]   if you are able to send something for her

## 2017-01-21 ENCOUNTER — Ambulatory Visit (INDEPENDENT_AMBULATORY_CARE_PROVIDER_SITE_OTHER): Payer: Medicare Other | Admitting: Family Medicine

## 2017-01-21 ENCOUNTER — Encounter: Payer: Self-pay | Admitting: Family Medicine

## 2017-01-21 VITALS — BP 156/92 | HR 107 | Temp 97.8°F | Ht 60.0 in | Wt 233.0 lb

## 2017-01-21 DIAGNOSIS — F419 Anxiety disorder, unspecified: Secondary | ICD-10-CM | POA: Diagnosis not present

## 2017-01-21 DIAGNOSIS — E785 Hyperlipidemia, unspecified: Secondary | ICD-10-CM | POA: Diagnosis not present

## 2017-01-21 DIAGNOSIS — E039 Hypothyroidism, unspecified: Secondary | ICD-10-CM

## 2017-01-21 DIAGNOSIS — G40909 Epilepsy, unspecified, not intractable, without status epilepticus: Secondary | ICD-10-CM | POA: Diagnosis not present

## 2017-01-21 DIAGNOSIS — I5033 Acute on chronic diastolic (congestive) heart failure: Secondary | ICD-10-CM | POA: Diagnosis not present

## 2017-01-21 DIAGNOSIS — N182 Chronic kidney disease, stage 2 (mild): Secondary | ICD-10-CM | POA: Diagnosis not present

## 2017-01-21 DIAGNOSIS — E876 Hypokalemia: Secondary | ICD-10-CM | POA: Diagnosis not present

## 2017-01-21 DIAGNOSIS — E1122 Type 2 diabetes mellitus with diabetic chronic kidney disease: Secondary | ICD-10-CM | POA: Diagnosis not present

## 2017-01-21 DIAGNOSIS — R300 Dysuria: Secondary | ICD-10-CM

## 2017-01-21 DIAGNOSIS — F341 Dysthymic disorder: Secondary | ICD-10-CM

## 2017-01-21 LAB — POC URINALSYSI DIPSTICK (AUTOMATED)
Bilirubin, UA: NEGATIVE
Ketones, UA: NEGATIVE
Leukocytes, UA: NEGATIVE
Nitrite, UA: NEGATIVE
Spec Grav, UA: >=1.03 — AB (ref 1.010–1.025)
Urobilinogen, UA: 0.2 E.U./dL
pH, UA: 6 (ref 5.0–8.0)

## 2017-01-21 LAB — CBC WITH DIFFERENTIAL/PLATELET
Basophils Absolute: 0.1 10*3/uL (ref 0.0–0.1)
Basophils Relative: 0.5 % (ref 0.0–3.0)
Eosinophils Absolute: 0.1 10*3/uL (ref 0.0–0.7)
Eosinophils Relative: 1.4 % (ref 0.0–5.0)
HCT: 36.5 % (ref 36.0–46.0)
Hemoglobin: 12.1 g/dL (ref 12.0–15.0)
Lymphocytes Relative: 13.9 % (ref 12.0–46.0)
Lymphs Abs: 1.3 10*3/uL (ref 0.7–4.0)
MCHC: 33.1 g/dL (ref 30.0–36.0)
MCV: 95.9 fl (ref 78.0–100.0)
Monocytes Absolute: 0.5 10*3/uL (ref 0.1–1.0)
Monocytes Relative: 5.8 % (ref 3.0–12.0)
Neutro Abs: 7.3 10*3/uL (ref 1.4–7.7)
Neutrophils Relative %: 78.4 % — ABNORMAL HIGH (ref 43.0–77.0)
Platelets: 407 10*3/uL — ABNORMAL HIGH (ref 150.0–400.0)
RBC: 3.81 Mil/uL — ABNORMAL LOW (ref 3.87–5.11)
RDW: 14.5 % (ref 11.5–15.5)
WBC: 9.3 10*3/uL (ref 4.0–10.5)

## 2017-01-21 LAB — TSH: TSH: 2.29 u[IU]/mL (ref 0.35–4.50)

## 2017-01-21 LAB — COMPREHENSIVE METABOLIC PANEL
ALT: 11 U/L (ref 0–35)
AST: 20 U/L (ref 0–37)
Albumin: 3.5 g/dL (ref 3.5–5.2)
Alkaline Phosphatase: 83 U/L (ref 39–117)
BUN: 25 mg/dL — ABNORMAL HIGH (ref 6–23)
CO2: 30 mEq/L (ref 19–32)
Calcium: 8.7 mg/dL (ref 8.4–10.5)
Chloride: 97 mEq/L (ref 96–112)
Creatinine, Ser: 0.94 mg/dL (ref 0.40–1.20)
GFR: 62.26 mL/min (ref >60.00)
Glucose, Bld: 338 mg/dL — ABNORMAL HIGH (ref 70–99)
Potassium: 3.2 mEq/L — ABNORMAL LOW (ref 3.5–5.1)
Sodium: 136 mEq/L (ref 135–145)
Total Bilirubin: 0.4 mg/dL (ref 0.2–1.2)
Total Protein: 7.1 g/dL (ref 6.0–8.3)

## 2017-01-21 LAB — POCT GLYCOSYLATED HEMOGLOBIN (HGB A1C): Hemoglobin A1C: 8.5

## 2017-01-21 MED ORDER — PHENAZOPYRIDINE HCL 100 MG PO TABS: 100.0000 mg | ORAL_TABLET | Freq: Three times a day (TID) | ORAL | 0 refills | Status: DC | PRN

## 2017-01-21 MED ORDER — ROSUVASTATIN CALCIUM 10 MG PO TABS: 10.0000 mg | ORAL_TABLET | Freq: Every day | ORAL | 3 refills | Status: DC

## 2017-01-21 MED ORDER — ALPRAZOLAM 0.5 MG PO TABS: 0.5000 mg | ORAL_TABLET | Freq: Two times a day (BID) | ORAL | 2 refills | Status: DC

## 2017-01-21 MED ORDER — CEFTRIAXONE SODIUM 1 G IJ SOLR
1.0000 g | Freq: Once | INTRAMUSCULAR | Status: AC
  Administered 2017-01-21: 1 g via INTRAMUSCULAR

## 2017-01-21 MED ORDER — SERTRALINE HCL 100 MG PO TABS: 100.0000 mg | ORAL_TABLET | Freq: Every morning | ORAL | 3 refills | Status: DC

## 2017-01-21 NOTE — Telephone Encounter (Signed)
Spoke with patient and let her know that the injection in the office was an antibiotic. Dr. Juleen China sent in [place] to the pharmacy for the spasm. She has not picked that up yet. She is going now to get.

## 2017-01-21 NOTE — Progress Notes (Signed)
Patient comes in today to follow up. She has several concerns today.   Pain with urination: patient stated that she has been having back pain for about three weeks. She has been drinking cranberry juice to see if this will help. She noticed that she has not been urinating as much. She has been using the urinal beside her bed due to the pain. Dr. Juleen China did tell her that she needs to be seen within a day or two from when the pain starts to test.   Medications: Patient stated that she has not gotten her medications from [facility] due to not calling in. I explained that they was sent in September with a year refill.   Hypertension: Patients blood pressure is stable today.   Diabetes: Patient sees Dr. Loanne Drilling for her diabetes. Patient takes insulin for her diabetes. She takes 3 units three times a day of the novolog. She takes the flex pen 10 units at night. Her A1c today was an 8.5.   Oxygen: Oxygen is stable today with being off of her oxygen. Patient stated that she uses her oxygen at home when walking around.   Wounds: She was seen for sores at last visit. Today they are healed up and look good.

## 2017-01-21 NOTE — Patient Instructions (Signed)
If you need to be seen on a weekend please contact the [place] office at [phone]. They are across from [facility].

## 2017-01-21 NOTE — Telephone Encounter (Signed)
Please see note below and advise regarding patient's pain  Copied from [place] [phone]. Topic: General - Other >> Jan 21, 2017  1:05 PM [name] wrote: Reason for CRM: patient calling stating that she had just the office today and got a shot and she said that her back is still hurting and that she can't deal with the pain she states that it had helped a little bit earlier

## 2017-01-21 NOTE — Progress Notes (Signed)
[name] is a 72 y.o. female is here for follow up.  History of Present Illness:   [name], CMA, scribe for Dr. Juleen China.  HPI:   Patient comes in today to follow up. She has several concerns today.   Pain with urination: patient stated that she has been having back pain for about three weeks. She has been drinking cranberry juice to see if this will help. She noticed that she has not been urinating as much. She has been using the urinal beside her bed due to the pain. Dr. Juleen China did tell her that she needs to be seen within a day or two from when the pain starts to test.   Medications: Patient stated that she has not gotten her medications from [facility] due to not calling in. I explained that they was sent in September with a year refill.   Hypertension: Patients blood pressure is stable today.   Diabetes: Patient sees Dr. Loanne Drilling for her diabetes. Patient takes insulin for her diabetes. She takes 3 units three times a day of the novolog. She takes the flex pen 10 units at night. Her A1c today was an 8.5.   Oxygen: Oxygen is stable today with being off of her oxygen. Patient stated that she uses her oxygen at home when walking around.   Wounds: She was seen for sores at last visit. Today they are healed up and look good.   Health Maintenance Due  Topic Date Due  . OPHTHALMOLOGY EXAM  04/20/1955  . TETANUS/TDAP  04/19/1964  . DEXA SCAN  04/20/2010  . PNA vac Low Risk Adult (1 of 2 - PCV13) 04/20/2010   No flowsheet data found.   PMHx, SurgHx, SocialHx, FamHx, Medications, and Allergies were reviewed in the Visit Navigator and updated as appropriate.   Patient Active Problem List   Diagnosis Date Noted  . LV dysfunction 10/06/2016  . 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 Acute on chronic diastolic CHF (congestive heart failure) ([place]) 09/09/2016  . Elevated troponin 09/09/2016  . Migraine 08/20/2016  .  Morbid obesity ([place]) 08/20/2016  . Anxiety 10/02/2015  . DOE (dyspnea on exertion) 10/02/2015  . Dyslipidemia 10/02/2015  . Nonrheumatic aortic valve stenosis 10/02/2015  . Old MI (myocardial infarction) 10/02/2015  . Insomnia 11/03/2014  . Weakness generalized 08/28/2014  . Seizure disorder ([place]) 08/28/2014  . Diabetes mellitus without complication ([place]) 75/64/3329  . Coronary artery disease of native artery of native heart with stable angina pectoris ([place]) 06/02/2010  . Depression 05/20/2007  . Essential hypertension 05/20/2007  . Diaphragmatic hernia 05/20/2007  . Diverticulosis of colon 05/20/2007  . IBS 05/20/2007  . Fatty liver 05/20/2007  . Hypothyroidism 05/20/2007  . Hx of transient ischemic attack (TIA) 05/20/2007   Social History   Tobacco Use  . Smoking status: Former Smoker    Last attempt to quit: 06/02/1970    Years since quitting: 46.6  . Smokeless tobacco: Never Used  Substance Use Topics  . Alcohol use: No  . Drug use: No   Current Medications and Allergies:   .  albuterol (PROVENTIL HFA;VENTOLIN HFA) 108 (90 Base) MCG/ACT inhaler, Inhale 2 puffs into the lungs every 6 (six) hours as needed for wheezing or shortness of breath. , Disp: , Rfl:  .  ALPRAZolam (XANAX) 0.5 MG tablet, Take 1 tablet (0.5 mg total) 2 (two) times daily by mouth., Disp: 60 tablet, Rfl: 2 .  aspirin EC 325  MG EC tablet, Take 1 tablet (325 mg total) by mouth daily., Disp: 30 tablet, Rfl: 0 .  furosemide (LASIX) 40 MG tablet, Take 20 mg by mouth daily. , Disp: , Rfl:  .  HYDROcodone-acetaminophen (NORCO/VICODIN) 5-325 MG tablet, Take 1 tablet by mouth every 6 (six) hours as needed for moderate pain. .  hydrOXYzine (ATARAX/VISTARIL) 10 MG tablet, Take 1 tablet (10 mg total) by mouth 2 (two) times daily as needed for itching., Disp: 30 tablet, Rfl: 5 .  insulin aspart (NOVOLOG) 100 UNIT/ML injection, Inject 0-9 Units into the skin 3 (three) times daily with meals. Sliding scale CBG 70 - 120:  0 units CBG 121 - 150: 1 unit,  CBG 151 - 200: 2 units,  CBG 201 - 250: 3 units,  CBG 251 - 300: 5 units,  CBG 301 - 350: 7 units,  CBG 351 - 400: 9 units   CBG > 400: 9 units and notify your MD, Disp: 10 mL, Rfl: 11 .  Insulin Detemir (LEVEMIR FLEXPEN) 100 UNIT/ML Pen, Used to inject 10 units of insulin subcutaneously daly at bedtime, Disp: 5 pen, Rfl: 11 .  insulin detemir (LEVEMIR) 100 UNIT/ML injection, Inject 0.1 mLs (10 Units total) into the skin at bedtime., Disp: 10 mL, Rfl: 11 .  isosorbide mononitrate (IMDUR) 60 MG 24 hr tablet, Take 60 mg by mouth daily. , Disp: , Rfl:  .  levETIRAcetam (KEPPRA) 1000 MG tablet, Take 1 tablet (1,000 mg total) by mouth 2 (two) times daily., Disp: 60 tablet, Rfl: 0 .  metoprolol (LOPRESSOR) 50 MG tablet, Take 50 mg by mouth 2 (two) times daily. , Disp: , Rfl: 0 .  nitroGLYCERIN (NITROSTAT) 0.4 MG SL tablet, Place 0.4 mg under the tongue every 5 (five) minutes as needed for chest pain., Disp: , Rfl:  .  penicillin v potassium (VEETID) 500 MG tablet, Take 1 tablet (500 mg total) by mouth 4 (four) times daily., Disp: 40 tablet, Rfl: 0 .  potassium chloride SA (K-DUR,KLOR-CON) 20 MEQ tablet, Take 20 mEq by mouth daily., Disp: , Rfl:  .  prasugrel (EFFIENT) 10 MG TABS tablet, Take 10 mg by mouth daily., Disp: , Rfl:  .  rizatriptan (MAXALT) 10 MG tablet, Take 1 tablet (10 mg total) by mouth as needed for migraine. May repeat in 2 hours if needed, Disp: 10 tablet .  rosuvastatin (CRESTOR) 10 MG tablet, Take 1 tablet (10 mg total) by mouth daily., Disp: 90 tablet, Rfl: 3 .  sertraline (ZOLOFT) 100 MG tablet, Take 1 tablet (100 mg total) by mouth every morning. - HAS NOT BEEN TAKING, OUT   Allergies  Allergen Reactions  . Codeine Itching   Review of Systems   Pertinent items are noted in the HPI. Otherwise, ROS is negative.  Vitals:   Vitals:   01/21/17 0928  BP: (!) 156/92  Pulse: (!) 107  Temp: 97.8 F (36.6 C)  TempSrc: Oral  SpO2: 94%  Weight:  233 lb (105.7 kg)  Height: 5' (1.524 m)     Body mass index is 45.5 kg/m.   Physical Exam:   Physical Exam  Constitutional: She is oriented to person, place, and time. She appears well-developed and well-nourished.  Wearing nasal canula.   HENT:  Head: Normocephalic and atraumatic.  Right Ear: External ear normal.  Left Ear: External ear normal.  Nose: Nose normal.  Mouth/Throat: Oropharynx is clear and moist.  Eyes: Conjunctivae and EOM are normal. Pupils are equal, round, and reactive to light.  Neck: Normal range of motion. Neck supple.  Cardiovascular: Normal rate, regular rhythm and intact distal pulses.  Pulmonary/Chest: Effort normal. She has decreased breath sounds.  Abdominal: Soft. Bowel sounds are normal.  Neurological: She is alert and oriented to person, place, and time.  Skin: Skin is warm. Capillary refill takes less than 2 seconds.  Psychiatric: She has a normal mood and affect. Her behavior is normal.  Nursing note and vitals reviewed.  Results for orders placed or performed in visit on 01/21/17  CBC with Differential/Platelet  Result Value Ref Range   WBC 9.3 4.0 - 10.5 K/uL   RBC 3.81 (L) 3.87 - 5.11 Mil/uL   Hemoglobin 12.1 12.0 - 15.0 g/dL   HCT 36.5 36.0 - 46.0 %   MCV 95.9 78.0 - 100.0 fl   MCHC 33.1 30.0 - 36.0 g/dL   RDW 14.5 11.5 - 15.5 %   Platelets 407.0 (H) 150.0 - 400.0 K/uL   Neutrophils Relative % 78.4 (H) 43.0 - 77.0 %   Lymphocytes Relative 13.9 12.0 - 46.0 %   Monocytes Relative 5.8 3.0 - 12.0 %   Eosinophils Relative 1.4 0.0 - 5.0 %   Basophils Relative 0.5 0.0 - 3.0 %   Neutro Abs 7.3 1.4 - 7.7 K/uL   Lymphs Abs 1.3 0.7 - 4.0 K/uL   Monocytes Absolute 0.5 0.1 - 1.0 K/uL   Eosinophils Absolute 0.1 0.0 - 0.7 K/uL   Basophils Absolute 0.1 0.0 - 0.1 K/uL  Comprehensive metabolic panel  Result Value Ref Range   Sodium 136 135 - 145 mEq/L   Potassium 3.2 (L) 3.5 - 5.1 mEq/L   Chloride 97 96 - 112 mEq/L   CO2 30 19 - 32 mEq/L    Glucose, Bld 338 (H) 70 - 99 mg/dL   BUN 25 (H) 6 - 23 mg/dL   Creatinine, Ser 0.94 0.40 - 1.20 mg/dL   Total Bilirubin 0.4 0.2 - 1.2 mg/dL   Alkaline Phosphatase 83 39 - 117 U/L   AST 20 0 - 37 U/L   ALT 11 0 - 35 U/L   Total Protein 7.1 6.0 - 8.3 g/dL   Albumin 3.5 3.5 - 5.2 g/dL   Calcium 8.7 8.4 - 10.5 mg/dL   GFR 62.26 >60.00 mL/min  TSH  Result Value Ref Range   TSH 2.29 0.35 - 4.50 uIU/mL  POCT Urinalysis Dipstick (Automated)  Result Value Ref Range   Color, UA Yellow    Clarity, UA Cloudy    Glucose, UA 2+    Bilirubin, UA Negative    Ketones, UA Negative    Spec Grav, UA >=1.030 (A) 1.010 - 1.025   Blood, UA 1+    pH, UA 6.0 5.0 - 8.0   Protein, UA 3+    Urobilinogen, UA 0.2 0.2 or 1.0 E.U./dL   Nitrite, UA Negative    Leukocytes, UA Negative Negative  POCT HgB A1C  Result Value Ref Range   Hemoglobin A1C 8.5    Assessment and Plan:   Diagnoses and all orders for this visit:  Dysuria Comments: New.  Patient complains of one week's worth of dysuria, urgency, and suprapubic pain.  She did not want to come in early to be evaluated.  In the office today, she has a difficult time giving a urine sample.  We were able to obtain a urine dip.  It did show market glucose and blood..  Treatment for UTI was given today.  This will need to be followed up  more closely.  I did warn the patient that it is very important to be evaluated for UTI, especially in a diabetic, quickly.  She will drink more fluid and if she is not having improvement in her symptoms with below treatment, will call us or go to urgent care. Orders: -     POCT Urinalysis Dipstick (Automated) -     Cancel: Urine Culture due to not enough urine sample obtained -     phenazopyridine (PYRIDIUM) 100 MG tablet; Take 1 tablet (100 mg total) by mouth 3 (three) times daily as needed for pain. -     cefTRIAXone (ROCEPHIN) injection 1 g -     cephALEXin (KEFLEX) 500 MG capsule; Take 1 capsule (500 mg total) by mouth 3  (three) times daily for 5 days.  Type 2 diabetes mellitus with stage 2 chronic kidney disease, without long-term current use of insulin (HCC) Comments: Worsening.  The patient is followed by endocrinology. Current symptoms: no chest pain, dyspnea or TIA's.  Taking medication compliantly without noted sided effects [x]   YES  []   NO  Episodes of hypoglycemia? []   YES  [x]   NO Maintaining a diabetic diet? []   YES  [x]   NO Trying to exercise on a regular basis? []   YES  [x]   NO  On ACE inhibitor or angiotensin II receptor blocker? []   YES  [x]   NO On Aspirin? [x]   YES  []   NO  Lab Results  Component Value Date   HGBA1C 8.5 01/21/2017    No results found for: Derl Barrow  Lab Results  Component Value Date   CHOL 172 09/10/2016   HDL 41 09/10/2016   LDLCALC 113 (H) 09/10/2016   TRIG 91 09/10/2016   CHOLHDL 4.2 09/10/2016     Wt Readings from Last 3 Encounters:  01/21/17 233 lb (105.7 kg)  11/22/16 241 lb 6.4 oz (109.5 kg)  09/22/16 239 lb (108.4 kg)   BP Readings from Last 3 Encounters:  01/21/17 (!) 156/92  12/17/16 118/80  11/22/16 138/78   Lab Results  Component Value Date   CREATININE 0.94 01/21/2017   Orders: -     POCT HgB A1C -     CBC with Differential/Platelet -     Comprehensive metabolic panel  Morbid obesity (HCC) Comments: We reviewed the importance of healthy food choices and more movement. Orders: -     TSH  Seizure disorder ([place]) Comments: Patient is on Keppra and has a diagnosis of seizure disorder.  She has not seen her neurologist.  Refill today with labs.  We will make sure that the patient has a plan in place to follow-up with neurology. Orders: -     Levetiracetam level  Dysthymia Comments: Patient has not been taking Zoloft due to running out.  Refills given today.  Patient states that she stopped taking her Zoloft due to running out of medication.  She states that her pharmacy called for refill but no answer was ever given.  We  have received no phone calls.  I did encourage the patient to let us know quickly if something like this happens in the future because it is likely due to poor communication.  Refill given today. Orders: -     sertraline (ZOLOFT) 100 MG tablet; Take 1 tablet (100 mg total) by mouth every morning.  Dyslipidemia Comments: Patient has been taking her husband's Crestor due to running out of her own medication.  Patient states that she has been taking her  husband's medication due to running out of medication.  She states that her pharmacy called for refill but no answer was ever given.  We have received no phone calls.  I did encourage the patient to let us know quickly if something like this happens in the future because it is likely due to poor communication.  Refill given today. Orders: -     rosuvastatin (CRESTOR) 10 MG tablet; Take 1 tablet (10 mg total) by mouth daily.  Acute on chronic diastolic CHF (congestive heart failure) ([place]) Comments: Still on O2 but feeling much improving. Weight continued to improve.  Taking daily weights.  Also followed by cardiology. Orders: -     rosuvastatin (CRESTOR) 10 MG tablet; Take 1 tablet (10 mg total) by mouth daily.  Anxiety Comments: Database reviewed.  Refill provided today. Orders: -     ALPRAZolam (XANAX) 0.5 MG tablet; Take 1 tablet (0.5 mg total) by mouth 2 (two) times daily.  Acquired hypothyroidism Comments: Stable.  Continue current dose for now. Orders: -     TSH  Hypokalemia Comments: We will call and make sure that the patient is taking her potassium supplements.  This may be due to elevated blood sugars.  . Reviewed expectations re: course of current medical issues. . Discussed self-management of symptoms. . Outlined signs and symptoms indicating need for more acute intervention. . Patient verbalized understanding and all questions were answered. Marland Kitchen Health Maintenance issues including appropriate healthy diet, exercise, and  smoking avoidance were discussed with patient. . See orders for this visit as documented in the electronic medical record. . Patient received an After Visit Summary.  Briscoe Deutscher, DO [place], [place] 01/22/2017  Future Appointments  Date Time Provider [place]  04/25/2017  9:20 AM Briscoe Deutscher, DO [facility]   Records requested if needed. Time spent with the patient: 45 minutes, of which >50% was spent in obtaining information about her symptoms, reviewing her previous labs, evaluations, and treatments, counseling her about her condition (please see the discussed topics above), and developing a plan to further investigate it; she had a number of questions which I addressed.

## 2017-01-22 MED ORDER — CEPHALEXIN 500 MG PO CAPS: 500.0000 mg | ORAL_CAPSULE | Freq: Three times a day (TID) | ORAL | 0 refills | Status: AC

## 2017-01-23 ENCOUNTER — Inpatient Hospital Stay (HOSPITAL_COMMUNITY)
Admission: EM | Admit: 2017-01-23 | Discharge: 2017-01-27 | DRG: 690 | Disposition: A | Discharge status: Home Health | Payer: Medicare Other | Attending: Internal Medicine | Admitting: Internal Medicine

## 2017-01-23 ENCOUNTER — Encounter (HOSPITAL_COMMUNITY): Payer: Self-pay | Admitting: Emergency Medicine

## 2017-01-23 ENCOUNTER — Emergency Department (HOSPITAL_COMMUNITY): Payer: Medicare Other

## 2017-01-23 DIAGNOSIS — N12 Tubulo-interstitial nephritis, not specified as acute or chronic: Secondary | ICD-10-CM | POA: Diagnosis not present

## 2017-01-23 DIAGNOSIS — I5032 Chronic diastolic (congestive) heart failure: Secondary | ICD-10-CM | POA: Diagnosis not present

## 2017-01-23 DIAGNOSIS — Z833 Family history of diabetes mellitus: Secondary | ICD-10-CM

## 2017-01-23 DIAGNOSIS — K760 Fatty (change of) liver, not elsewhere classified: Secondary | ICD-10-CM | POA: Diagnosis present

## 2017-01-23 DIAGNOSIS — Z6841 Body Mass Index (BMI) 40.0 and over, adult: Secondary | ICD-10-CM | POA: Diagnosis not present

## 2017-01-23 DIAGNOSIS — M545 Low back pain, unspecified: Secondary | ICD-10-CM

## 2017-01-23 DIAGNOSIS — I252 Old myocardial infarction: Secondary | ICD-10-CM

## 2017-01-23 DIAGNOSIS — Z794 Long term (current) use of insulin: Secondary | ICD-10-CM

## 2017-01-23 DIAGNOSIS — F329 Major depressive disorder, single episode, unspecified: Secondary | ICD-10-CM | POA: Diagnosis present

## 2017-01-23 DIAGNOSIS — R739 Hyperglycemia, unspecified: Secondary | ICD-10-CM

## 2017-01-23 DIAGNOSIS — I110 Hypertensive heart disease with heart failure: Secondary | ICD-10-CM | POA: Diagnosis not present

## 2017-01-23 DIAGNOSIS — Z90710 Acquired absence of both cervix and uterus: Secondary | ICD-10-CM

## 2017-01-23 DIAGNOSIS — E1165 Type 2 diabetes mellitus with hyperglycemia: Secondary | ICD-10-CM | POA: Diagnosis not present

## 2017-01-23 DIAGNOSIS — Z825 Family history of asthma and other chronic lower respiratory diseases: Secondary | ICD-10-CM

## 2017-01-23 DIAGNOSIS — S32039A Unspecified fracture of third lumbar vertebra, initial encounter for closed fracture: Secondary | ICD-10-CM | POA: Diagnosis not present

## 2017-01-23 DIAGNOSIS — Z8249 Family history of ischemic heart disease and other diseases of the circulatory system: Secondary | ICD-10-CM

## 2017-01-23 DIAGNOSIS — R52 Pain, unspecified: Secondary | ICD-10-CM | POA: Diagnosis not present

## 2017-01-23 DIAGNOSIS — Z8673 Personal history of transient ischemic attack (TIA), and cerebral infarction without residual deficits: Secondary | ICD-10-CM

## 2017-01-23 DIAGNOSIS — Z9114 Patient's other noncompliance with medication regimen: Secondary | ICD-10-CM

## 2017-01-23 DIAGNOSIS — Z87891 Personal history of nicotine dependence: Secondary | ICD-10-CM

## 2017-01-23 DIAGNOSIS — K589 Irritable bowel syndrome without diarrhea: Secondary | ICD-10-CM | POA: Diagnosis present

## 2017-01-23 DIAGNOSIS — E785 Hyperlipidemia, unspecified: Secondary | ICD-10-CM | POA: Diagnosis present

## 2017-01-23 DIAGNOSIS — Z885 Allergy status to narcotic agent status: Secondary | ICD-10-CM

## 2017-01-23 DIAGNOSIS — I2510 Atherosclerotic heart disease of native coronary artery without angina pectoris: Secondary | ICD-10-CM | POA: Diagnosis present

## 2017-01-23 DIAGNOSIS — K222 Esophageal obstruction: Secondary | ICD-10-CM | POA: Diagnosis present

## 2017-01-23 DIAGNOSIS — Z9981 Dependence on supplemental oxygen: Secondary | ICD-10-CM

## 2017-01-23 DIAGNOSIS — R011 Cardiac murmur, unspecified: Secondary | ICD-10-CM | POA: Diagnosis present

## 2017-01-23 DIAGNOSIS — J9611 Chronic respiratory failure with hypoxia: Secondary | ICD-10-CM | POA: Diagnosis not present

## 2017-01-23 DIAGNOSIS — Z7982 Long term (current) use of aspirin: Secondary | ICD-10-CM

## 2017-01-23 DIAGNOSIS — K219 Gastro-esophageal reflux disease without esophagitis: Secondary | ICD-10-CM | POA: Diagnosis present

## 2017-01-23 DIAGNOSIS — Z79899 Other long term (current) drug therapy: Secondary | ICD-10-CM

## 2017-01-23 DIAGNOSIS — M4856XA Collapsed vertebra, not elsewhere classified, lumbar region, initial encounter for fracture: Secondary | ICD-10-CM | POA: Diagnosis present

## 2017-01-23 DIAGNOSIS — S3992XA Unspecified injury of lower back, initial encounter: Secondary | ICD-10-CM | POA: Diagnosis not present

## 2017-01-23 DIAGNOSIS — S32030A Wedge compression fracture of third lumbar vertebra, initial encounter for closed fracture: Secondary | ICD-10-CM

## 2017-01-23 DIAGNOSIS — I350 Nonrheumatic aortic (valve) stenosis: Secondary | ICD-10-CM | POA: Diagnosis present

## 2017-01-23 DIAGNOSIS — K449 Diaphragmatic hernia without obstruction or gangrene: Secondary | ICD-10-CM | POA: Diagnosis present

## 2017-01-23 DIAGNOSIS — E119 Type 2 diabetes mellitus without complications: Secondary | ICD-10-CM

## 2017-01-23 DIAGNOSIS — E039 Hypothyroidism, unspecified: Secondary | ICD-10-CM | POA: Diagnosis present

## 2017-01-23 DIAGNOSIS — E876 Hypokalemia: Secondary | ICD-10-CM | POA: Diagnosis present

## 2017-01-23 DIAGNOSIS — F419 Anxiety disorder, unspecified: Secondary | ICD-10-CM | POA: Diagnosis present

## 2017-01-23 DIAGNOSIS — G40909 Epilepsy, unspecified, not intractable, without status epilepticus: Secondary | ICD-10-CM | POA: Diagnosis present

## 2017-01-23 DIAGNOSIS — M546 Pain in thoracic spine: Secondary | ICD-10-CM | POA: Diagnosis not present

## 2017-01-23 LAB — URINALYSIS, ROUTINE W REFLEX MICROSCOPIC
BILIRUBIN URINE: NEGATIVE
HGB URINE DIPSTICK: NEGATIVE
Ketones, ur: NEGATIVE mg/dL
LEUKOCYTES UA: NEGATIVE
NITRITE: POSITIVE — AB
Specific Gravity, Urine: 1.025 (ref 1.005–1.030)
pH: 5 (ref 5.0–8.0)

## 2017-01-23 LAB — BASIC METABOLIC PANEL
ANION GAP: 12 (ref 5–15)
BUN: 27 mg/dL — ABNORMAL HIGH (ref 6–20)
CO2: 25 mmol/L (ref 22–32)
Calcium: 9.3 mg/dL (ref 8.9–10.3)
Chloride: 99 mmol/L — ABNORMAL LOW (ref 101–111)
Creatinine, Ser: 1.01 mg/dL — ABNORMAL HIGH (ref 0.44–1.00)
GFR calc Af Amer: >60 mL/min (ref >60)
GFR, EST NON AFRICAN AMERICAN: 55 mL/min — AB (ref >60)
Glucose, Bld: 387 mg/dL — ABNORMAL HIGH (ref 65–99)
POTASSIUM: 3.3 mmol/L — AB (ref 3.5–5.1)
SODIUM: 136 mmol/L (ref 135–145)

## 2017-01-23 LAB — CBC
HEMATOCRIT: 36.8 % (ref 36.0–46.0)
HEMOGLOBIN: 12.2 g/dL (ref 12.0–15.0)
MCH: 31.5 pg (ref 26.0–34.0)
MCHC: 33.2 g/dL (ref 30.0–36.0)
MCV: 95.1 fL (ref 78.0–100.0)
Platelets: 372 10*3/uL (ref 150–400)
RBC: 3.87 MIL/uL (ref 3.87–5.11)
RDW: 13.5 % (ref 11.5–15.5)
WBC: 10.3 10*3/uL (ref 4.0–10.5)

## 2017-01-23 LAB — CBG MONITORING, ED
Glucose-Capillary: 381 mg/dL — ABNORMAL HIGH (ref 65–99)
Glucose-Capillary: 379 mg/dL — ABNORMAL HIGH (ref 65–99)

## 2017-01-23 MED ORDER — DEXTROSE 5 % IV SOLN
1.0000 g | Freq: Once | INTRAVENOUS | Status: AC
  Administered 2017-01-23: 1 g via INTRAVENOUS
  Filled 2017-01-23: qty 10

## 2017-01-23 MED ORDER — SODIUM CHLORIDE 0.9 % IV BOLUS (SEPSIS)
1000.0000 mL | Freq: Once | INTRAVENOUS | Status: AC
  Administered 2017-01-23: 1000 mL via INTRAVENOUS

## 2017-01-23 NOTE — ED Triage Notes (Signed)
Per EMS, pt c/o lower back pain with movement. Denies trauma. Hx kidney infections, reports pain feels similar, pt noticed decreased urine output today. Denies pain with urination. Pt diabetic, EMS CBG 488. Pt reports taking insulin as prescribed. A&O x 4. Ambulatory with assistance from EMS stretcher to wheelchair.

## 2017-01-23 NOTE — ED Provider Notes (Signed)
[facility] EMERGENCY DEPARTMENT Provider Note   CSN: [id number] Arrival date & time: 01/23/17  1955     History   Chief Complaint Chief Complaint  Patient presents with  . Back Pain  . Hyperglycemia    HPI [name] is a 72 y.o. female.  HPI  Patient is a 72 year old female, she has a known history of insulin-dependent diabetes, she is morbidly obese, she also has a history of shortness of breath and is on chronic oxygen therapy.  She presents to the hospital today with a complaint of right lower back pain.  According to the medical record review the patient was seen at the family doctor's office 2 days ago during which time she had similar symptoms, she states that she has had some pain with urination and having low back pain which have been going on for what she said was 3 weeks in which she describes to me as 8 days.  She had been taking cranberry juice which had not helped with the discomfort but notes she had had decreased urinary frequency.  The patient also reports that she was given a shot prior to discharge which she thinks was an antibiotic.  She in fact was given a shot of Rocephin and prescribed cephalexin, prescribed Pyridium, the patient does not have a bottle of cephalexin with her though she states she has been taking the Pyridium.  The patient denies any numbness or weakness of the legs, she has had pain with walking and ambulating because of the right lower back pain.  She does not have any fevers nausea vomiting or diarrhea.  She has had decreased appetite, taking decreased amounts of fluids and was aware that her blood sugar was going up today.  Past Medical History:  Diagnosis Date  . Anxiety   . Coronary artery disease   . Depression   . Diabetes mellitus    insulin dependent  . Edema of lower extremity   . Esophageal stricture 2008   EGD  . Fatigue   . GERD (gastroesophageal reflux disease)   . Heart murmur   . Hiatal hernia 2008   EGD  . Hyperlipidemia   . Hypertension   . Knee pain   . Morbid obesity ([place])   . Nonproductive cough    chronic  . Orthopnea   . Seizure disorder ([place])   . SOB (shortness of breath)   . Syncope and collapse   . Tremor     Patient Active Problem List   Diagnosis Date Noted  . LV dysfunction 10/06/2016  . 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 Acute on chronic diastolic CHF (congestive heart failure) ([place]) 09/09/2016  . Elevated troponin 09/09/2016  . Migraine 08/20/2016  . Morbid obesity ([place]) 08/20/2016  . Anxiety 10/02/2015  . DOE (dyspnea on exertion) 10/02/2015  . Dyslipidemia 10/02/2015  . Nonrheumatic aortic valve stenosis 10/02/2015  . Old MI (myocardial infarction) 10/02/2015  . Insomnia 11/03/2014  . Weakness generalized 08/28/2014  . Seizure disorder ([place]) 08/28/2014  . Diabetes mellitus without complication ([place]) 29/92/4268  . Coronary artery disease of native artery of native heart with stable angina pectoris ([place]) 06/02/2010  . Depression 05/20/2007  . Essential hypertension 05/20/2007  . Diaphragmatic hernia 05/20/2007  . Diverticulosis of colon 05/20/2007  . IBS 05/20/2007  . Fatty liver 05/20/2007  . Hypothyroidism 05/20/2007  . Hx of transient ischemic attack (TIA) 05/20/2007    Past Surgical History:  Procedure Laterality Date  . ABDOMINAL HYSTERECTOMY    . CARDIAC CATHETERIZATION  03/28/2009  . KNEE ARTHROSCOPY    . TRANSTHORACIC ECHOCARDIOGRAM     showed ef of 65% with no regional wall motion abnormalities    OB History    No data available       Home Medications    Prior to Admission medications   Medication Sig Start Date End Date Taking? Authorizing Provider  acetaminophen (TYLENOL) 325 MG tablet Take 2 tablets (650 mg total) by mouth every 4 (four) hours as needed for headache or mild pain. 09/12/16   Robbie Lis, MD  albuterol (PROVENTIL HFA;VENTOLIN HFA) 108 (90 Base)  MCG/ACT inhaler Inhale 2 puffs into the lungs every 6 (six) hours as needed for wheezing or shortness of breath.     [provider]  ALPRAZolam Duanne Moron) 0.5 MG tablet Take 1 tablet (0.5 mg total) by mouth 2 (two) times daily. 01/21/17   Briscoe Deutscher, DO  aspirin EC 325 MG EC tablet Take 1 tablet (325 mg total) by mouth daily. 09/02/14   Rai, Vernelle Emerald, MD  cephALEXin (KEFLEX) 500 MG capsule Take 1 capsule (500 mg total) by mouth 3 (three) times daily for 5 days. 01/22/17 01/27/17  Briscoe Deutscher, DO  furosemide (LASIX) 40 MG tablet Take 20 mg by mouth daily.     [provider]  HYDROcodone-acetaminophen (NORCO/VICODIN) 5-325 MG tablet Take 1 tablet by mouth every 6 (six) hours as needed for moderate pain. 12/17/16   Vivi Barrack, MD  hydrOXYzine (ATARAX/VISTARIL) 10 MG tablet Take 1 tablet (10 mg total) by mouth 2 (two) times daily as needed for itching. 09/22/16   Briscoe Deutscher, DO  insulin aspart (NOVOLOG) 100 UNIT/ML injection Inject 0-9 Units into the skin 3 (three) times daily with meals. Sliding scale CBG 70 - 120: 0 units CBG 121 - 150: 1 unit,  CBG 151 - 200: 2 units,  CBG 201 - 250: 3 units,  CBG 251 - 300: 5 units,  CBG 301 - 350: 7 units,  CBG 351 - 400: 9 units   CBG > 400: 9 units and notify your MD 09/14/16   Renato Shin, MD  Insulin Detemir (LEVEMIR FLEXPEN) 100 UNIT/ML Pen Used to inject 10 units of insulin subcutaneously daly at bedtime 10/08/16   Renato Shin, MD  insulin detemir (LEVEMIR) 100 UNIT/ML injection Inject 0.1 mLs (10 Units total) into the skin at bedtime. 09/14/16   Renato Shin, MD  isosorbide mononitrate (IMDUR) 60 MG 24 hr tablet Take 60 mg by mouth daily.     [provider]  levETIRAcetam (KEPPRA) 1000 MG tablet Take 1 tablet (1,000 mg total) by mouth 2 (two) times daily. 08/30/16   Briscoe Deutscher, DO  metoprolol (LOPRESSOR) 50 MG tablet Take 50 mg by mouth 2 (two) times daily.  07/02/14   [provider]  nitroGLYCERIN  (NITROSTAT) 0.4 MG SL tablet Place 0.4 mg under the tongue every 5 (five) minutes as needed for chest pain.    [provider]  penicillin v potassium (VEETID) 500 MG tablet Take 1 tablet (500 mg total) by mouth 4 (four) times daily. 12/17/16   Vivi Barrack, MD  phenazopyridine (PYRIDIUM) 100 MG tablet Take 1 tablet (100 mg total) by mouth 3 (three) times daily as needed for pain. 01/21/17   Briscoe Deutscher, DO  potassium chloride SA (K-DUR,KLOR-CON) 20 MEQ tablet Take 20 mEq by mouth daily.    [provider]  prasugrel (  EFFIENT) 10 MG TABS tablet Take 10 mg by mouth daily.    [provider]  rizatriptan (MAXALT) 10 MG tablet Take 1 tablet (10 mg total) by mouth as needed for migraine. May repeat in 2 hours if needed 09/22/16   Briscoe Deutscher, DO  rosuvastatin (CRESTOR) 10 MG tablet Take 1 tablet (10 mg total) by mouth daily. 01/21/17   Briscoe Deutscher, DO  sertraline (ZOLOFT) 100 MG tablet Take 1 tablet (100 mg total) by mouth every morning. 01/21/17   Briscoe Deutscher, DO    Family History Family History  Problem Relation Age of Onset  . Heart attack Mother   . Hypertension Mother   . COPD Father   . Heart disease Sister   . Diabetes Brother   . Diabetes Brother   . Prostate cancer Brother   . Kidney disease Brother     Social History Social History   Tobacco Use  . Smoking status: Former Smoker    Last attempt to quit: 06/02/1970    Years since quitting: 46.6  . Smokeless tobacco: Never Used  Substance Use Topics  . Alcohol use: No  . Drug use: No     Allergies   Codeine   Review of Systems Review of Systems  All other systems reviewed and are negative.    Physical Exam Updated Vital Signs BP (!) 149/100   Pulse 100   Temp 98.7 F (37.1 C) (Oral)   Resp 18   SpO2 97%   Physical Exam  Constitutional: She appears well-developed and well-nourished. No distress.  HENT:  Head: Normocephalic and atraumatic.  Mouth/Throat: No  oropharyngeal exudate.  Mucous membranes are dry  Eyes: Conjunctivae and EOM are normal. Pupils are equal, round, and reactive to light. Right eye exhibits no discharge. Left eye exhibits no discharge. No scleral icterus.  Neck: Normal range of motion. Neck supple. No JVD present. No thyromegaly present.  Cardiovascular: Normal rate, regular rhythm and intact distal pulses. Exam reveals no gallop and no friction rub.  Murmur heard. Pulmonary/Chest: Effort normal and breath sounds normal. No respiratory distress. She has no wheezes. She has no rales.  Abdominal: Soft. Bowel sounds are normal. She exhibits no distension and no mass. There is no tenderness.  Morbidly obese abdomen, nontender  Musculoskeletal: Normal range of motion. She exhibits tenderness. She exhibits no edema.  The patient is able to move her bilateral lower extremities without any difficulty.  She does have pain which is reproducible to palpation in the right lower back down onto the right sacrum as well as the right buttock.  There is no pain with compression laterally over the hips or the pelvis, she is able to flex her hips and knees without any difficulty.  Lymphadenopathy:    She has no cervical adenopathy.  Neurological: She is alert. Coordination normal.  Length and sensation of the bilateral lower extremity seems to be appropriate.  She is able to lay on her side and move both legs without any difficulty.  Skin: Skin is warm and dry. No rash noted. No erythema.  Psychiatric: She has a normal mood and affect. Her behavior is normal.  Nursing note and vitals reviewed.    ED Treatments / Results  Labs (all labs ordered are listed, but only abnormal results are displayed) Labs Reviewed  BASIC METABOLIC PANEL - Abnormal; Notable for the following components:      Result Value   Potassium 3.3 (*)    Chloride 99 (*)  Glucose, Bld 387 (*)    BUN 27 (*)    Creatinine, Ser 1.01 (*)    GFR calc non Af Amer 55 (*)     All other components within normal limits  CBG MONITORING, ED - Abnormal; Notable for the following components:   Glucose-Capillary 381 (*)    All other components within normal limits  CBC  URINALYSIS, ROUTINE W REFLEX MICROSCOPIC     Radiology No results found.  Procedures Procedures (including critical care time)  Medications Ordered in ED Medications  sodium chloride 0.9 % bolus 1,000 mL (not administered)     Initial Impression / Assessment and Plan / ED Course  I have reviewed the triage vital signs and the nursing notes.  Pertinent labs & imaging results that were available during my care of the patient were reviewed by me and considered in my medical decision making (see chart for details).  Clinical Course as of Jan 23 2318  Sun Jan 23, 2017  2319 Urinalysis still reveals signs of urinary tract infection with positive nitrites, 6-30 white blood cells and bacteria are present.  Thankfully there is no leukocytosis and no significant renal dysfunction though her BUN is elevated at 27.  [BM]  2319 BUN: (!) 27 [BM]  2319 Glucose-Capillary: (!) 379 [BM]  2319 Nitrite: (!) POSITIVE [BM]  2319 Protein: (!) >=300 [BM]  2320 Additionally the patient has ongoing hyperglycemia despite IV fluids.  Discussed the case with the hospitalist who was kind enough to admit Glucose: (!) >=500 [BM]    Clinical Course User Index [BM] Noemi Chapel, MD    She is hyperglycemic, she does not have a significant change in her renal function, urinalysis will be obtained by in and out catheterization and she will need IV fluids.  I will also obtain imaging of her lower back and her pelvis to rule out any signs of bony abnormalities though there is not appear to feel any deep tissue infections redness tenderness induration or otherwise.  There is no subcutaneous emphysema, no signs of necrotizing fasciitis, this does not appear to be sciatica with a normal neurologic exam and no difficulty with  moving the legs.  The patient is in agreement with the plan  Final Clinical Impressions(s) / ED Diagnoses   Final diagnoses:  Pyelonephritis  Acute right-sided low back pain without sciatica  Closed compression fracture of third lumbar vertebra, initial encounter ([place])  Hyperglycemia      Noemi Chapel, MD 01/23/17 2320

## 2017-01-24 DIAGNOSIS — R011 Cardiac murmur, unspecified: Secondary | ICD-10-CM | POA: Diagnosis present

## 2017-01-24 DIAGNOSIS — F419 Anxiety disorder, unspecified: Secondary | ICD-10-CM | POA: Diagnosis present

## 2017-01-24 DIAGNOSIS — I5032 Chronic diastolic (congestive) heart failure: Secondary | ICD-10-CM | POA: Diagnosis present

## 2017-01-24 DIAGNOSIS — K449 Diaphragmatic hernia without obstruction or gangrene: Secondary | ICD-10-CM | POA: Diagnosis present

## 2017-01-24 DIAGNOSIS — K760 Fatty (change of) liver, not elsewhere classified: Secondary | ICD-10-CM | POA: Diagnosis present

## 2017-01-24 DIAGNOSIS — K222 Esophageal obstruction: Secondary | ICD-10-CM | POA: Diagnosis present

## 2017-01-24 DIAGNOSIS — E1165 Type 2 diabetes mellitus with hyperglycemia: Secondary | ICD-10-CM | POA: Diagnosis present

## 2017-01-24 DIAGNOSIS — E785 Hyperlipidemia, unspecified: Secondary | ICD-10-CM | POA: Diagnosis present

## 2017-01-24 DIAGNOSIS — I252 Old myocardial infarction: Secondary | ICD-10-CM | POA: Diagnosis not present

## 2017-01-24 DIAGNOSIS — K589 Irritable bowel syndrome without diarrhea: Secondary | ICD-10-CM | POA: Diagnosis present

## 2017-01-24 DIAGNOSIS — N12 Tubulo-interstitial nephritis, not specified as acute or chronic: Principal | ICD-10-CM

## 2017-01-24 DIAGNOSIS — J9611 Chronic respiratory failure with hypoxia: Secondary | ICD-10-CM | POA: Diagnosis not present

## 2017-01-24 DIAGNOSIS — K219 Gastro-esophageal reflux disease without esophagitis: Secondary | ICD-10-CM | POA: Diagnosis present

## 2017-01-24 DIAGNOSIS — E039 Hypothyroidism, unspecified: Secondary | ICD-10-CM | POA: Diagnosis present

## 2017-01-24 DIAGNOSIS — F329 Major depressive disorder, single episode, unspecified: Secondary | ICD-10-CM | POA: Diagnosis present

## 2017-01-24 DIAGNOSIS — Z833 Family history of diabetes mellitus: Secondary | ICD-10-CM | POA: Diagnosis not present

## 2017-01-24 DIAGNOSIS — M545 Low back pain: Secondary | ICD-10-CM | POA: Diagnosis not present

## 2017-01-24 DIAGNOSIS — I350 Nonrheumatic aortic (valve) stenosis: Secondary | ICD-10-CM | POA: Diagnosis present

## 2017-01-24 DIAGNOSIS — G40909 Epilepsy, unspecified, not intractable, without status epilepticus: Secondary | ICD-10-CM | POA: Diagnosis present

## 2017-01-24 DIAGNOSIS — E876 Hypokalemia: Secondary | ICD-10-CM | POA: Diagnosis present

## 2017-01-24 DIAGNOSIS — M4856XA Collapsed vertebra, not elsewhere classified, lumbar region, initial encounter for fracture: Secondary | ICD-10-CM | POA: Diagnosis present

## 2017-01-24 DIAGNOSIS — I2510 Atherosclerotic heart disease of native coronary artery without angina pectoris: Secondary | ICD-10-CM | POA: Diagnosis present

## 2017-01-24 DIAGNOSIS — R739 Hyperglycemia, unspecified: Secondary | ICD-10-CM | POA: Diagnosis not present

## 2017-01-24 DIAGNOSIS — I110 Hypertensive heart disease with heart failure: Secondary | ICD-10-CM | POA: Diagnosis present

## 2017-01-24 DIAGNOSIS — S32030A Wedge compression fracture of third lumbar vertebra, initial encounter for closed fracture: Secondary | ICD-10-CM | POA: Diagnosis not present

## 2017-01-24 DIAGNOSIS — E119 Type 2 diabetes mellitus without complications: Secondary | ICD-10-CM | POA: Diagnosis not present

## 2017-01-24 DIAGNOSIS — Z6841 Body Mass Index (BMI) 40.0 and over, adult: Secondary | ICD-10-CM | POA: Diagnosis not present

## 2017-01-24 LAB — CBC
HCT: 33 % — ABNORMAL LOW (ref 36.0–46.0)
HEMOGLOBIN: 10.9 g/dL — AB (ref 12.0–15.0)
MCH: 31.6 pg (ref 26.0–34.0)
MCHC: 33 g/dL (ref 30.0–36.0)
MCV: 95.7 fL (ref 78.0–100.0)
Platelets: 316 10*3/uL (ref 150–400)
RBC: 3.45 MIL/uL — ABNORMAL LOW (ref 3.87–5.11)
RDW: 13.5 % (ref 11.5–15.5)
WBC: 8.9 10*3/uL (ref 4.0–10.5)

## 2017-01-24 LAB — GLUCOSE, CAPILLARY
GLUCOSE-CAPILLARY: 296 mg/dL — AB (ref 65–99)
Glucose-Capillary: 305 mg/dL — ABNORMAL HIGH (ref 65–99)
Glucose-Capillary: 102 mg/dL — ABNORMAL HIGH (ref 65–99)
Glucose-Capillary: 193 mg/dL — ABNORMAL HIGH (ref 65–99)

## 2017-01-24 LAB — BASIC METABOLIC PANEL
ANION GAP: 12 (ref 5–15)
BUN: 29 mg/dL — ABNORMAL HIGH (ref 6–20)
CALCIUM: 8.7 mg/dL — AB (ref 8.9–10.3)
CO2: 23 mmol/L (ref 22–32)
Chloride: 102 mmol/L (ref 101–111)
Creatinine, Ser: 0.97 mg/dL (ref 0.44–1.00)
GFR calc Af Amer: >60 mL/min (ref >60)
GFR calc non Af Amer: 57 mL/min — ABNORMAL LOW (ref >60)
GLUCOSE: 345 mg/dL — AB (ref 65–99)
Potassium: 3.3 mmol/L — ABNORMAL LOW (ref 3.5–5.1)
Sodium: 137 mmol/L (ref 135–145)

## 2017-01-24 MED ORDER — INSULIN DETEMIR 100 UNIT/ML ~~LOC~~ SOLN
10.0000 [IU] | Freq: Every day | SUBCUTANEOUS | Status: DC
  Administered 2017-01-24: 10 [IU] via SUBCUTANEOUS
  Filled 2017-01-24: qty 0.1

## 2017-01-24 MED ORDER — METOPROLOL TARTRATE 50 MG PO TABS
50.0000 mg | ORAL_TABLET | Freq: Two times a day (BID) | ORAL | Status: DC
  Administered 2017-01-24 – 2017-01-27 (×8): 50 mg via ORAL
  Filled 2017-01-24 (×6): qty 1
  Filled 2017-01-24: qty 2
  Filled 2017-01-24: qty 1

## 2017-01-24 MED ORDER — LEVETIRACETAM 500 MG PO TABS
1000.0000 mg | ORAL_TABLET | Freq: Two times a day (BID) | ORAL | Status: DC
  Administered 2017-01-24 – 2017-01-27 (×8): 1000 mg via ORAL
  Filled 2017-01-24 (×8): qty 2

## 2017-01-24 MED ORDER — ISOSORBIDE MONONITRATE ER 60 MG PO TB24
60.0000 mg | ORAL_TABLET | Freq: Every day | ORAL | Status: DC
  Administered 2017-01-24 – 2017-01-27 (×4): 60 mg via ORAL
  Filled 2017-01-24 (×4): qty 1

## 2017-01-24 MED ORDER — DEXTROSE 5 % IV SOLN
1.0000 g | INTRAVENOUS | Status: DC
  Administered 2017-01-24 – 2017-01-26 (×3): 1 g via INTRAVENOUS
  Filled 2017-01-24 (×4): qty 10

## 2017-01-24 MED ORDER — ALPRAZOLAM 0.5 MG PO TABS
0.5000 mg | ORAL_TABLET | Freq: Two times a day (BID) | ORAL | Status: DC
  Administered 2017-01-24 (×3): 0.5 mg via ORAL
  Filled 2017-01-24: qty 1
  Filled 2017-01-24: qty 2
  Filled 2017-01-24: qty 1

## 2017-01-24 MED ORDER — SENNOSIDES-DOCUSATE SODIUM 8.6-50 MG PO TABS
1.0000 | ORAL_TABLET | Freq: Once | ORAL | Status: AC
  Administered 2017-01-24: 1 via ORAL
  Filled 2017-01-24: qty 1

## 2017-01-24 MED ORDER — ROSUVASTATIN CALCIUM 10 MG PO TABS
10.0000 mg | ORAL_TABLET | Freq: Every day | ORAL | Status: DC
  Administered 2017-01-24 – 2017-01-27 (×4): 10 mg via ORAL
  Filled 2017-01-24 (×4): qty 1

## 2017-01-24 MED ORDER — SERTRALINE HCL 100 MG PO TABS
100.0000 mg | ORAL_TABLET | Freq: Every morning | ORAL | Status: DC
  Administered 2017-01-24 – 2017-01-27 (×4): 100 mg via ORAL
  Filled 2017-01-24 (×4): qty 1

## 2017-01-24 MED ORDER — LACTATED RINGERS IV SOLN
INTRAVENOUS | Status: AC
  Administered 2017-01-24: 03:00:00 via INTRAVENOUS

## 2017-01-24 MED ORDER — POLYETHYLENE GLYCOL 3350 17 G PO PACK
17.0000 g | PACK | Freq: Every day | ORAL | Status: DC | PRN
  Filled 2017-01-24: qty 1

## 2017-01-24 MED ORDER — HEPARIN SODIUM (PORCINE) 5000 UNIT/ML IJ SOLN
5000.0000 [IU] | Freq: Three times a day (TID) | INTRAMUSCULAR | Status: DC
  Administered 2017-01-24 – 2017-01-27 (×11): 5000 [IU] via SUBCUTANEOUS
  Filled 2017-01-24 (×11): qty 1

## 2017-01-24 MED ORDER — ACETAMINOPHEN 325 MG PO TABS
650.0000 mg | ORAL_TABLET | ORAL | Status: DC | PRN
  Administered 2017-01-24 – 2017-01-25 (×3): 650 mg via ORAL
  Filled 2017-01-24 (×2): qty 2

## 2017-01-24 MED ORDER — INSULIN DETEMIR 100 UNIT/ML ~~LOC~~ SOLN
15.0000 [IU] | Freq: Every day | SUBCUTANEOUS | Status: DC
  Administered 2017-01-24: 15 [IU] via SUBCUTANEOUS
  Filled 2017-01-24: qty 0.15

## 2017-01-24 MED ORDER — POTASSIUM CHLORIDE CRYS ER 20 MEQ PO TBCR
20.0000 meq | EXTENDED_RELEASE_TABLET | Freq: Every day | ORAL | Status: DC
Start: 2017-01-24 — End: 2017-01-27
  Administered 2017-01-24 – 2017-01-27 (×4): 20 meq via ORAL
  Filled 2017-01-24 (×4): qty 1

## 2017-01-24 MED ORDER — INSULIN ASPART 100 UNIT/ML ~~LOC~~ SOLN: 0.0000 [IU] | Freq: Every day | SUBCUTANEOUS | Status: DC

## 2017-01-24 MED ORDER — ASPIRIN EC 325 MG PO TBEC
325.0000 mg | DELAYED_RELEASE_TABLET | Freq: Every day | ORAL | Status: DC
  Administered 2017-01-24 – 2017-01-27 (×4): 325 mg via ORAL
  Filled 2017-01-24 (×4): qty 1

## 2017-01-24 MED ORDER — PRASUGREL HCL 10 MG PO TABS
10.0000 mg | ORAL_TABLET | Freq: Every day | ORAL | Status: DC
  Administered 2017-01-27: 10 mg via ORAL
  Filled 2017-01-24 (×5): qty 1

## 2017-01-24 MED ORDER — ALBUTEROL SULFATE (2.5 MG/3ML) 0.083% IN NEBU: 3.0000 mL | INHALATION_SOLUTION | Freq: Four times a day (QID) | RESPIRATORY_TRACT | Status: DC | PRN

## 2017-01-24 MED ORDER — INSULIN ASPART 100 UNIT/ML ~~LOC~~ SOLN
0.0000 [IU] | Freq: Three times a day (TID) | SUBCUTANEOUS | Status: DC
  Administered 2017-01-24: 3 [IU] via SUBCUTANEOUS
  Administered 2017-01-24: 11 [IU] via SUBCUTANEOUS
  Administered 2017-01-24: 8 [IU] via SUBCUTANEOUS
  Administered 2017-01-25: 3 [IU] via SUBCUTANEOUS
  Administered 2017-01-25: 5 [IU] via SUBCUTANEOUS
  Administered 2017-01-25: 3 [IU] via SUBCUTANEOUS
  Administered 2017-01-25: 8 [IU] via SUBCUTANEOUS
  Administered 2017-01-26: 2 [IU] via SUBCUTANEOUS
  Administered 2017-01-26: 5 [IU] via SUBCUTANEOUS
  Administered 2017-01-26 – 2017-01-27 (×3): 3 [IU] via SUBCUTANEOUS

## 2017-01-24 NOTE — Progress Notes (Signed)
Patient admitted after midnight, please see H&P.  No culture done before abx given but patient is clinically improving.  Eulogio Bear DO

## 2017-01-24 NOTE — H&P (Addendum)
History and Physical   [name] HQI:[id number] DOB: 1945-07-25 DOA: 01/23/2017  PCP: Briscoe Deutscher, DO  Chief Complaint: back pain  HPI:  This is a 72 year old obese woman with self-reported coronary artery disease status post MI in 2011 underwent ablative therapy, diastolic heart failure, chronic hypoxic respiratory failure, insulin-dependent diabetes who is presenting with back pain.  She was evaluated in clinic to 3 days prior to admission where she will status with UTI, given 1 dose of ceftriaxone and prescribed by mouth antimicrobial therapy, however patient did not pick up the by mouth antimicrobial for unclear reasons. She's had persistent bilateral lower back pain associated with some dysuria that has improved, due to persistent pain and she sought medical attention. She reports this is not felt during prior kidney infections. She reports some subjective fever, no frank hematuria.    She lives at home with her husband, his sleep primary please refer to grandchildren with whom 34 whose ages are 7 and 72 years of age, she does not smoke. She does not use any assist devices to ambulate.  ED Course: The emergency department systolic blood pressure 132G, heart rate within normal limits, white count 10.3, potassium 3.3, hyperglycemic with a blood sugar of 387 on the BMP. Urinalysis significant for positive nitrites.  She is given ceftriaxone for treatment of urinary tract infection possible medicine consult for further management.  Review of Systems: A complete ROS was obtained; pertinent positives negatives are denoted in the HPI. Otherwise, all systems are negative.   Past Medical History:  Diagnosis Date  . Anxiety   . Coronary artery disease   . Depression   . Diabetes mellitus    insulin dependent  . Edema of lower extremity   . Esophageal stricture 2008   EGD  . Fatigue   . GERD (gastroesophageal reflux disease)   . Heart murmur   . Hiatal hernia 2008   EGD  .  Hyperlipidemia   . Hypertension   . Knee pain   . Morbid obesity ([place])   . Nonproductive cough    chronic  . Orthopnea   . Seizure disorder ([place])   . SOB (shortness of breath)   . Syncope and collapse   . Tremor    Social History   Socioeconomic History  . Marital status: Married    Spouse name: Not on file  . Number of children: 2  . Years of education: 56  . Highest education level: Not on file  Social Needs  . Financial resource strain: Not on file  . Food insecurity - worry: Not on file  . Food insecurity - inability: Not on file  . Transportation needs - medical: Not on file  . Transportation needs - non-medical: Not on file  Occupational History  . Occupation: Retired  Tobacco Use  . Smoking status: Former Smoker    Last attempt to quit: 06/02/1970    Years since quitting: 46.6  . Smokeless tobacco: Never Used  Substance and Sexual Activity  . Alcohol use: No  . Drug use: No  . Sexual activity: Not on file  Other Topics Concern  . Not on file  Social History Narrative   Lives at home with her husband.   Left-handed.   No caffeine use.   Family History  Problem Relation Age of Onset  . Heart attack Mother   . Hypertension Mother   . COPD Father   . Heart disease Sister   . Diabetes Brother   . Diabetes Brother   .  Prostate cancer Brother   . Kidney disease Brother     Physical Exam: Vitals:   01/23/17 2230 01/23/17 2300 01/23/17 2315 01/24/17 0154  BP: 125/71 140/72 (!) 144/63 112/69  Pulse: 91 88 89 89  Resp:    18  Temp:      TempSrc:      SpO2: 98% 97% 100% 99%   General: Appears calm and comfortable, obese white woman ENT: Grossly normal hearing, dry mucus membranes Cardiovascular: RRR. 3/6 systolic murmur. No LE edema.  Respiratory: CTA bilaterally. No wheezes or crackles. Normal respiratory effort. 2 L [place] O2. Abdomen: Soft, non-tender.  Skin: No rash or induration seen on limited exam. Musculoskeletal: Grossly normal tone BUE/BLE.  Unable to sit up in bed without significant hand-held assistance.   Psychiatric: Grossly normal mood and affect. Neurologic: Moves all extremities in coordinated fashion. Alert and answering questions appropriately.  I have personally reviewed the following labs, culture data, and imaging studies.  Assessment/Plan:  Pyelonephritis, suspected Clinically with back pain, associated with dysuria and UA with positive nitrites.   PLAN: -will continue empiric ceftriaxone and follow up micro data -provide IVF support with LR at 100 cc q hr x 10 hours -if clinically not improved, consider CT scan of abdomen to further evaluate  Other problems -Insulin dependent DM with hyperglycemia: provide basal insulin now (10 units), also give corrective factor with rapid acting insulin ACHS, first dose now -CAD and hx of chronic diastolic HF: continue chronic dual antiplatelet therapy, statin, BB; hold diuretic therapy for now -Chronic hypoxic respiratory failure: continue chronic home O2 requirement, 2 L -Anxiety: stable, continue SSRI -Hx of seizure disorder: continue home AED -Hypokalemia: mild at K of 3.3 on admission, continue PO K supplement  DVT prophylaxis: [place] hep Code Status: full code  Disposition Plan: Anticipate D/C home in 2-5 days Consults called: none Admission status: admit to hospital medicine team   Cheri Rous, MD [facility] [phone]  If 7PM-7AM, please contact night-coverage [email or web address] Password [email or web address]

## 2017-01-25 DIAGNOSIS — J9611 Chronic respiratory failure with hypoxia: Secondary | ICD-10-CM

## 2017-01-25 DIAGNOSIS — R739 Hyperglycemia, unspecified: Secondary | ICD-10-CM

## 2017-01-25 LAB — CBC
HEMATOCRIT: 32.6 % — AB (ref 36.0–46.0)
HEMOGLOBIN: 10.4 g/dL — AB (ref 12.0–15.0)
MCH: 30.9 pg (ref 26.0–34.0)
MCHC: 31.9 g/dL (ref 30.0–36.0)
MCV: 96.7 fL (ref 78.0–100.0)
Platelets: 326 10*3/uL (ref 150–400)
RBC: 3.37 MIL/uL — ABNORMAL LOW (ref 3.87–5.11)
RDW: 13.8 % (ref 11.5–15.5)
WBC: 7.6 10*3/uL (ref 4.0–10.5)

## 2017-01-25 LAB — BASIC METABOLIC PANEL
Anion gap: 12 (ref 5–15)
BUN: 30 mg/dL — ABNORMAL HIGH (ref 6–20)
CHLORIDE: 103 mmol/L (ref 101–111)
CO2: 24 mmol/L (ref 22–32)
CREATININE: 1.06 mg/dL — AB (ref 0.44–1.00)
Calcium: 8.9 mg/dL (ref 8.9–10.3)
GFR calc non Af Amer: 52 mL/min — ABNORMAL LOW (ref >60)
GFR, EST AFRICAN AMERICAN: 60 mL/min — AB (ref >60)
Glucose, Bld: 229 mg/dL — ABNORMAL HIGH (ref 65–99)
POTASSIUM: 3.4 mmol/L — AB (ref 3.5–5.1)
Sodium: 139 mmol/L (ref 135–145)

## 2017-01-25 LAB — GLUCOSE, CAPILLARY
GLUCOSE-CAPILLARY: 188 mg/dL — AB (ref 65–99)
GLUCOSE-CAPILLARY: 252 mg/dL — AB (ref 65–99)
GLUCOSE-CAPILLARY: 191 mg/dL — AB (ref 65–99)
Glucose-Capillary: 224 mg/dL — ABNORMAL HIGH (ref 65–99)

## 2017-01-25 LAB — URINE CULTURE: Culture: NO GROWTH

## 2017-01-25 MED ORDER — TRAZODONE HCL 50 MG PO TABS
25.0000 mg | ORAL_TABLET | Freq: Every evening | ORAL | Status: DC | PRN
  Administered 2017-01-25: 25 mg via ORAL
  Filled 2017-01-25: qty 1

## 2017-01-25 MED ORDER — ALPRAZOLAM 0.5 MG PO TABS
0.5000 mg | ORAL_TABLET | Freq: Two times a day (BID) | ORAL | Status: DC | PRN
  Administered 2017-01-25 – 2017-01-27 (×4): 0.5 mg via ORAL
  Filled 2017-01-25 (×4): qty 1

## 2017-01-25 MED ORDER — POTASSIUM CHLORIDE CRYS ER 20 MEQ PO TBCR
40.0000 meq | EXTENDED_RELEASE_TABLET | Freq: Once | ORAL | Status: AC
  Administered 2017-01-25: 40 meq via ORAL
  Filled 2017-01-25: qty 2

## 2017-01-25 MED ORDER — INSULIN ASPART 100 UNIT/ML ~~LOC~~ SOLN
4.0000 [IU] | Freq: Three times a day (TID) | SUBCUTANEOUS | Status: DC
  Administered 2017-01-25 – 2017-01-27 (×5): 4 [IU] via SUBCUTANEOUS

## 2017-01-25 MED ORDER — INSULIN DETEMIR 100 UNIT/ML ~~LOC~~ SOLN
18.0000 [IU] | Freq: Every day | SUBCUTANEOUS | Status: DC
  Administered 2017-01-25: 18 [IU] via SUBCUTANEOUS
  Filled 2017-01-25: qty 0.18

## 2017-01-25 NOTE — Evaluation (Signed)
Physical Therapy Evaluation Patient Details Name: [name] MRN: [id number] DOB: 12-Nov-1945 Today's Date: 01/25/2017   History of Present Illness  This is a 72 year old obese woman with self-reported coronary artery disease status post MI in 2011 underwent ablative therapy, diastolic heart failure, chronic hypoxic respiratory failure, insulin-dependent diabetes who is presenting with back pain.  Clinical Impression  Pt admitted with above diagnosis. Pt currently with functional limitations due to the deficits listed below (see PT Problem List). Pt needed encouragement to get out of bed. Has been having back pain with motion as well as some abdominal pain. Once up, pt needed RW for safety (had not been using one at home) and was very fatigued after 15' with min-guard A.   Pt will benefit from skilled PT to increase their independence and safety with mobility to allow discharge to the venue listed below.       Follow Up Recommendations No PT follow up    Equipment Recommendations  None recommended by PT    Recommendations for Other Services       Precautions / Restrictions Precautions Precautions: Fall Precaution Comments: has fallen at home Restrictions Weight Bearing Restrictions: No      Mobility  Bed Mobility Overal bed mobility: Needs Assistance Bed Mobility: Supine to Sit     Supine to sit: Supervision     General bed mobility comments: pt comes to EOB with increased time and effort but no need of physical assist  Transfers Overall transfer level: Needs assistance Equipment used: Rolling walker (2 wheeled) Transfers: Sit to/from Stand Sit to Stand: Min guard         General transfer comment: pt did not use RW recently at home but did not feel steady without AD this time up so RW was used for support. [place] on RW  Ambulation/Gait Ambulation/Gait assistance: Min guard Ambulation Distance (Feet): 15 Feet Assistive device: Rolling walker (2 wheeled) Gait  Pattern/deviations: Step-through pattern;Decreased stride length;Trunk flexed;Antalgic Gait velocity: decreased Gait velocity interpretation: <1.8 ft/sec, indicative of risk for recurrent falls General Gait Details: back pain evident with ambulation, antalgic gait. Min guard A as pt was slightly unsteady even with RW. Pt fatigued very quickly. Maintained O2 sats in 90's on 2L O2  Stairs            Wheelchair Mobility    Modified Rankin (Stroke Patients Only)       Balance Overall balance assessment: Needs assistance;History of Falls Sitting-balance support: No upper extremity supported Sitting balance-Leahy Scale: Good     Standing balance support: Single extremity supported Standing balance-Leahy Scale: Poor Standing balance comment: reliant on UE support                             Pertinent Vitals/Pain Pain Assessment: No/denies pain         Faces 4/10 back pain during ambulation, returned to 0/10 with seated rest.   Home Living Family/patient expects to be discharged to:: Private residence Living Arrangements: Spouse/significant other Available Help at Discharge: Family;Available 24 hours/day Type of Home: House Home Access: Level entry     Home Layout: One level Home Equipment: Cane - single point;Walker - 2 wheels;Wheelchair - manual;Shower seat;Bedside commode Additional Comments: has 3 stairs to back patio    Prior Function Level of Independence: Independent               Hand Dominance   Dominant Hand: Right    Extremity/Trunk Assessment  Upper Extremity Assessment Upper Extremity Assessment: Overall WFL for tasks assessed    Lower Extremity Assessment Lower Extremity Assessment: Generalized weakness    Cervical / Trunk Assessment Cervical / Trunk Assessment: Kyphotic;Other exceptions Cervical / Trunk Exceptions: obesity  Communication   Communication: No difficulties  Cognition Arousal/Alertness: Awake/alert Behavior  During Therapy: WFL for tasks assessed/performed Overall Cognitive Status: Within Functional Limits for tasks assessed                                        General Comments General comments (skin integrity, edema, etc.): discussed mobility for management of back pain    Exercises General Exercises - Lower Extremity Ankle Circles/Pumps: AROM;Both;10 reps;Seated   Assessment/Plan    PT Assessment Patient needs continued PT services  PT Problem List Decreased strength;Decreased activity tolerance;Decreased balance;Decreased mobility;Decreased knowledge of use of DME;Decreased knowledge of precautions;Obesity;Pain       PT Treatment Interventions DME instruction;Gait training;Stair training;Functional mobility training;Therapeutic activities;Therapeutic exercise;Balance training;Patient/family education    PT Goals (Current goals can be found in the Care Plan section)  Acute Rehab PT Goals Patient Stated Goal: return home without pain PT Goal Formulation: With patient Time For Goal Achievement: 02/08/17 Potential to Achieve Goals: Good    Frequency Min 3X/week   Barriers to discharge        Co-evaluation               AM-PAC PT "6 Clicks" Daily Activity  Outcome Measure Difficulty turning over in bed (including adjusting bedclothes, sheets and blankets)?: A Little Difficulty moving from lying on back to sitting on the side of the bed? : A Little Difficulty sitting down on and standing up from a chair with arms (e.g., wheelchair, bedside commode, etc,.)?: A Little Help needed moving to and from a bed to chair (including a wheelchair)?: A Little Help needed walking in hospital room?: A Lot Help needed climbing 3-5 steps with a railing? : A Lot 6 Click Score: 16    End of Session Equipment Utilized During Treatment: Gait belt Activity Tolerance: Patient tolerated treatment well Patient left: in chair;with call bell/phone within reach Nurse  Communication: Mobility status;Other (comment)(chair alarm present but not turned on, bed alarm was not on) PT Visit Diagnosis: Other abnormalities of gait and mobility (R26.89);Unsteadiness on feet (R26.81);Pain Pain - part of body: (back)    Time: 1212-1237 PT Time Calculation (min) (ACUTE ONLY): 25 min   Charges:   PT Evaluation $PT Eval Moderate Complexity: 1 Mod PT Treatments $Gait Training: 8-22 mins   PT G Codes:        Leighton Roach, PT  Acute Rehab Services  [place] 01/25/2017, 3:11 PM

## 2017-01-25 NOTE — Progress Notes (Addendum)
Inpatient Diabetes Program Recommendations  AACE/ADA: New Consensus Statement on Inpatient Glycemic Control (2015)  Target Ranges:  Prepandial:   less than 140 mg/dL      Peak postprandial:   less than 180 mg/dL (1-2 hours)      Critically ill patients:  140 - 180 mg/dL  Results for [name], WUEBKER (MRN [id number]) as of 01/25/2017 12:28  Ref. Range 01/24/2017 08:13 01/24/2017 12:01 01/24/2017 16:28 01/24/2017 21:08 01/25/2017 06:34  Glucose-Capillary Latest Ref Range: 65 - 99 mg/dL 296 (H) 305 (H) 102 (H) 193 (H) 224 (H)    Review of Glycemic Control  Diabetes history: DM2 Outpatient Diabetes medications: Levemir 10 units daily, Novolog 0-9 units TID Current orders for Inpatient glycemic control: Levemir 15 units QHS, Novolog 0-15 units ACHS  Inpatient Diabetes Program Recommendations: Insulin - Basal: Please consider increasing Levemir 18 units QHS. Insulin - Meal Coverage: Please consider ordering Novolog 4 units TID with meals if patient eats at least 50% of meals.  Thanks, Barnie Alderman, RN, MSN, CDE Diabetes Coordinator Inpatient Diabetes Program [phone] (Team Pager from 8am to 5pm)

## 2017-01-25 NOTE — Progress Notes (Signed)
PROGRESS NOTE    [name]  ION:[id number] DOB: Jan 02, 1946 DOA: 01/23/2017 PCP: Briscoe Deutscher, DO   Outpatient Specialists:     Brief Narrative:  This is a 72 year old obese woman with self-reported coronary artery disease status post MI in 2011 underwent ablative therapy, diastolic heart failure, chronic hypoxic respiratory failure, insulin-dependent diabetes who is presenting with back pain.  She was evaluated in clinic to 3 days prior to admission where she will status with UTI, given 1 dose of ceftriaxone and prescribed by mouth antimicrobial therapy, however patient did not pick up the by mouth antimicrobial for unclear reasons. She's had persistent bilateral lower back pain associated with some dysuria that has improved, due to persistent pain and she sought medical attention. She reports this is not felt during prior kidney infections. She reports some subjective fever, no frank hematuria.        Assessment & Plan:   Active Problems:   Pyelonephritis   Pyelonephritis, suspected -culture not done before abx -IV rocephin and then pateint has 5 days of keflex at home per husband (did not pick up on Saturday as [facility] did not call and tell him there was a prescription)  Insulin dependent DM with hyperglycemia:  -SSI -increase long-acting  CAD and hx of chronic diastolic HF: continue chronic dual antiplatelet therapy, statin, BB; hold diuretic therapy for now  Chronic hypoxic respiratory failure: continue chronic home O2 requirement, 2 L  Anxiety: stable, continue SSRI  Hx of seizure disorder: continue home AED  Hypokalemia:  -replete      DVT prophylaxis:  SQ Heparin  Code Status: Full Code   Family Communication: husband  Disposition Plan:  Home in AM after PT Eval   Consultants:      Subjective: Did not sleep well last PM so sleepy today  Objective: Vitals:   01/24/17 1300 01/24/17 1959 01/25/17 0430 01/25/17 1300  BP: (!) 177/84  127/77 124/60 133/76  Pulse: 74 77 65 67  Resp: 19 17 16 16   Temp: 97.8 F (36.6 C) 97.8 F (36.6 C) 97.6 F (36.4 C) (!) 97 F (36.1 C)  TempSrc: Oral Oral Oral Oral  SpO2: 100% 97% 100% 100%    Intake/Output Summary (Last 24 hours) at 01/25/2017 1421 Last data filed at 01/25/2017 1300 Gross per 24 hour  Intake 440 ml  Output -  Net 440 ml   There were no vitals filed for this visit.  Examination:  General exam: chronically ill appearing Respiratory system: clear Cardiovascular system: rrr Gastrointestinal system: +BS, soft Central nervous system: Alert and pleasant Extremities: moves all 4 ext     Data Reviewed: I have personally reviewed following labs and imaging studies  CBC: Recent Labs  Lab 01/21/17 1027 01/23/17 2012 01/24/17 0303 01/25/17 0649  WBC 9.3 10.3 8.9 7.6  NEUTROABS 7.3  --   --   --   HGB 12.1 12.2 10.9* 10.4*  HCT 36.5 36.8 33.0* 32.6*  MCV 95.9 95.1 95.7 96.7  PLT 407.0* 372 316 244   Basic Metabolic Panel: Recent Labs  Lab 01/21/17 1027 01/23/17 2012 01/24/17 0303 01/25/17 0649  NA 136 136 137 139  K 3.2* 3.3* 3.3* 3.4*  CL 97 99* 102 103  CO2 30 25 23 24   GLUCOSE 338* 387* 345* 229*  BUN 25* 27* 29* 30*  CREATININE 0.94 1.01* 0.97 1.06*  CALCIUM 8.7 9.3 8.7* 8.9   GFR: Estimated Creatinine Clearance: 53.5 mL/min (A) (by C-G formula based on SCr of 1.06 mg/dL (H)).  Liver Function Tests: Recent Labs  Lab 01/21/17 1027  AST 20  ALT 11  ALKPHOS 83  BILITOT 0.4  PROT 7.1  ALBUMIN 3.5   No results for input(s): LIPASE, AMYLASE in the last 168 hours. No results for input(s): AMMONIA in the last 168 hours. Coagulation Profile: No results for input(s): INR, PROTIME in the last 168 hours. Cardiac Enzymes: No results for input(s): CKTOTAL, CKMB, CKMBINDEX, TROPONINI in the last 168 hours. BNP (last 3 results) No results for input(s): PROBNP in the last 8760 hours. HbA1C: No results for input(s): HGBA1C in the last 72  hours. CBG: Recent Labs  Lab 01/24/17 1201 01/24/17 1628 01/24/17 2108 01/25/17 0634 01/25/17 1142  GLUCAP 305* 102* 193* 224* 188*   Lipid Profile: No results for input(s): CHOL, HDL, LDLCALC, TRIG, CHOLHDL, LDLDIRECT in the last 72 hours. Thyroid Function Tests: No results for input(s): TSH, T4TOTAL, FREET4, T3FREE, THYROIDAB in the last 72 hours. Anemia Panel: No results for input(s): VITAMINB12, FOLATE, FERRITIN, TIBC, IRON, RETICCTPCT in the last 72 hours. Urine analysis:    Component Value Date/Time   COLORURINE ORANGE (A) 01/23/2017 2240   APPEARANCEUR CLEAR 01/23/2017 2240   LABSPEC 1.025 01/23/2017 2240   PHURINE 5.0 01/23/2017 2240   GLUCOSEU >=500 (A) 01/23/2017 2240   HGBUR NEGATIVE 01/23/2017 2240   BILIRUBINUR NEGATIVE 01/23/2017 2240   BILIRUBINUR Negative 01/21/2017 [place] 01/23/2017 2240   PROTEINUR >=300 (A) 01/23/2017 2240   UROBILINOGEN 0.2 01/21/2017 1044   UROBILINOGEN 0.2 08/28/2014 1555   NITRITE POSITIVE (A) 01/23/2017 2240   LEUKOCYTESUR NEGATIVE 01/23/2017 2240     Recent Results (from the past 240 hour(s))  Culture, Urine     Status: None   Collection Time: 01/24/17  5:47 PM  Result Value Ref Range Status   Specimen Description URINE, CLEAN CATCH  Final   Special Requests NONE  Final   Culture NO GROWTH  Final   Report Status 01/25/2017 FINAL  Final      Anti-infectives (From admission, onward)   Start     Dose/Rate Route Frequency Ordered Stop   01/24/17 2200  cefTRIAXone (ROCEPHIN) 1 g in dextrose 5 % 50 mL IVPB     1 g 100 mL/hr over 30 Minutes Intravenous Every 24 hours 01/24/17 0209     01/23/17 2315  cefTRIAXone (ROCEPHIN) 1 g in dextrose 5 % 50 mL IVPB     1 g 100 mL/hr over 30 Minutes Intravenous  Once 01/23/17 2309 01/24/17 0157       Radiology Studies: Dg Lumbar Spine Complete  Result Date: 01/23/2017 CLINICAL DATA:  Pain without trauma EXAM: LUMBAR SPINE - COMPLETE 4+ VIEW COMPARISON:  None.  FINDINGS: There is anterior wedging of L3 which is new compared to March 27, 2009. No other fractures. No malalignment. Mild multilevel degenerative changes. IMPRESSION: Anterior wedging of L3 with approximately 10-15% loss of anterior height is new since 2011 and age indeterminate. An acute compression fracture cannot be excluded on this study. Electronically Signed   By: Dorise Bullion III M.D   On: 01/23/2017 22:06   Dg Pelvis 1-2 Views  Result Date: 01/23/2017 CLINICAL DATA:  Low back pain.  No trauma. EXAM: PELVIS - 1-2 VIEW COMPARISON:  None. FINDINGS: Limited views of the sacrum are unremarkable. The iliac bones are intact. The left hip is not well assessed due to positioning. No obvious hip fracture is noted. IMPRESSION: Left hip is not well evaluated due to positioning. No fractures identified  within the pelvis. Electronically Signed   By: Dorise Bullion III M.D   On: 01/23/2017 22:04        Scheduled Meds: . aspirin  325 mg Oral Daily  . heparin  5,000 Units Subcutaneous Q8H  . insulin aspart  0-15 Units Subcutaneous TID AC & HS  . insulin aspart  4 Units Subcutaneous TID WC  . insulin detemir  18 Units Subcutaneous QHS  . isosorbide mononitrate  60 mg Oral Daily  . levETIRAcetam  1,000 mg Oral BID  . metoprolol tartrate  50 mg Oral BID  . potassium chloride SA  20 mEq Oral Daily  . prasugrel  10 mg Oral Daily  . rosuvastatin  10 mg Oral Daily  . sertraline  100 mg Oral q morning - 10a   Continuous Infusions: . cefTRIAXone (ROCEPHIN)  IV Stopped (01/24/17 2136)     LOS: 1 day    Time spent: 25 min    Geradine Girt, DO [facility] Pager [phone]  If 7PM-7AM, please contact night-coverage [email or web address] Password [facility] 01/25/2017, 2:21 PM

## 2017-01-26 ENCOUNTER — Other Ambulatory Visit: Payer: Self-pay

## 2017-01-26 DIAGNOSIS — S32030A Wedge compression fracture of third lumbar vertebra, initial encounter for closed fracture: Secondary | ICD-10-CM

## 2017-01-26 LAB — GLUCOSE, CAPILLARY
GLUCOSE-CAPILLARY: 93 mg/dL (ref 65–99)
GLUCOSE-CAPILLARY: 203 mg/dL — AB (ref 65–99)
Glucose-Capillary: 175 mg/dL — ABNORMAL HIGH (ref 65–99)
Glucose-Capillary: 136 mg/dL — ABNORMAL HIGH (ref 65–99)
Glucose-Capillary: 117 mg/dL — ABNORMAL HIGH (ref 65–99)

## 2017-01-26 MED ORDER — INSULIN DETEMIR 100 UNIT/ML ~~LOC~~ SOLN
20.0000 [IU] | Freq: Every day | SUBCUTANEOUS | Status: DC
  Administered 2017-01-26: 20 [IU] via SUBCUTANEOUS
  Filled 2017-01-26 (×2): qty 0.2

## 2017-01-26 MED ORDER — HYDROCODONE-ACETAMINOPHEN 5-325 MG PO TABS
1.0000 | ORAL_TABLET | Freq: Four times a day (QID) | ORAL | Status: DC | PRN
  Administered 2017-01-26 – 2017-01-27 (×4): 1 via ORAL
  Filled 2017-01-26 (×4): qty 1

## 2017-01-26 MED ORDER — DICLOFENAC SODIUM 1 % TD GEL
2.0000 g | Freq: Four times a day (QID) | TRANSDERMAL | Status: DC
  Administered 2017-01-26 (×2): 2 g via TOPICAL
  Filled 2017-01-26: qty 100

## 2017-01-26 MED ORDER — METHOCARBAMOL 500 MG PO TABS
500.0000 mg | ORAL_TABLET | Freq: Three times a day (TID) | ORAL | Status: DC
  Administered 2017-01-26 – 2017-01-27 (×4): 500 mg via ORAL
  Filled 2017-01-26 (×4): qty 1

## 2017-01-26 NOTE — Progress Notes (Signed)
PROGRESS NOTE    [name]  LEX:[id number] DOB: 02-12-1945 DOA: 01/23/2017 PCP: Briscoe Deutscher, DO   Outpatient Specialists:     Brief Narrative:  This is a 72 year old obese woman with self-reported coronary artery disease status post MI in 2011 underwent ablative therapy, diastolic heart failure, chronic hypoxic respiratory failure, insulin-dependent diabetes who is presenting with back pain.  She was evaluated in clinic to 3 days prior to admission where she will status with UTI, given 1 dose of ceftriaxone and prescribed by mouth antimicrobial therapy, however patient did not pick up the by mouth antimicrobial for unclear reasons. She's had persistent bilateral lower back pain associated with some dysuria that has improved, due to persistent pain and she sought medical attention. She reports this is not felt during prior kidney infections. She reports some subjective fever, no frank hematuria.        Assessment & Plan:   Active Problems:   Pyelonephritis   Pyelonephritis, suspected -culture not done before abx -IV rocephin and then pateint has 5 days of keflex at home per husband (did not pick up on Saturday as [facility] did not call and tell him there was a prescription)  L3 mild compression fx -anterior wedge -ambulate -pain control and robaxin for muscle relaxation  Insulin dependent DM with hyperglycemia:  -SSI -increase long-acting -poorly controlled  CAD and hx of chronic diastolic HF: continue chronic dual antiplatelet therapy, statin, BB; hold diuretic therapy for now  Chronic hypoxic respiratory failure: continue chronic home O2 requirement, 2 L  Anxiety: stable, continue SSRI  Hx of seizure disorder: continue home AED  Hypokalemia:  -replete   Husband and patient appear to have some cognitive issues.  Husband asked the same question 3 times in a row.  Wife continually yelled at him through out our conversations.  Family reports when patient is in  the hospital, she does well but when she goes home she is not mobile, mainly laying in bed.  Husband reports she vomits after every meal but that does not happen in the hospital.      DVT prophylaxis:  SQ Heparin  Code Status: Full Code   Family Communication: Husband/daughter  Disposition Plan:  Home in AM after PT Eval   Consultants:      Subjective: Did not sleep well last PM so sleepy today  Objective: Vitals:   01/25/17 2101 01/26/17 0527 01/26/17 1440 01/26/17 1448  BP: 140/70 (!) 160/75 137/77 (!) 153/77  Pulse: 75 73 74 75  Resp: 16 16    Temp: 98.2 F (36.8 C) 97.8 F (36.6 C)    TempSrc: Oral Oral    SpO2: 97% 100%      Intake/Output Summary (Last 24 hours) at 01/26/2017 1631 Last data filed at 01/26/2017 1600 Gross per 24 hour  Intake 480 ml  Output -  Net 480 ml   There were no vitals filed for this visit.  Examination:  General exam: chronically ill appearing Respiratory system: clear Cardiovascular system: rrr Gastrointestinal system: +BS, soft Central nervous system: Alert and pleasant Extremities: moves all 4 ext     Data Reviewed: I have personally reviewed following labs and imaging studies  CBC: Recent Labs  Lab 01/21/17 1027 01/23/17 2012 01/24/17 0303 01/25/17 0649  WBC 9.3 10.3 8.9 7.6  NEUTROABS 7.3  --   --   --   HGB 12.1 12.2 10.9* 10.4*  HCT 36.5 36.8 33.0* 32.6*  MCV 95.9 95.1 95.7 96.7  PLT 407.0* 372 316 326  Basic Metabolic Panel: Recent Labs  Lab 01/21/17 1027 01/23/17 2012 01/24/17 0303 01/25/17 0649  NA 136 136 137 139  K 3.2* 3.3* 3.3* 3.4*  CL 97 99* 102 103  CO2 30 25 23 24   GLUCOSE 338* 387* 345* 229*  BUN 25* 27* 29* 30*  CREATININE 0.94 1.01* 0.97 1.06*  CALCIUM 8.7 9.3 8.7* 8.9   GFR: Estimated Creatinine Clearance: 53.5 mL/min (A) (by C-G formula based on SCr of 1.06 mg/dL (H)). Liver Function Tests: Recent Labs  Lab 01/21/17 1027  AST 20  ALT 11  ALKPHOS 83  BILITOT 0.4    PROT 7.1  ALBUMIN 3.5   No results for input(s): LIPASE, AMYLASE in the last 168 hours. No results for input(s): AMMONIA in the last 168 hours. Coagulation Profile: No results for input(s): INR, PROTIME in the last 168 hours. Cardiac Enzymes: No results for input(s): CKTOTAL, CKMB, CKMBINDEX, TROPONINI in the last 168 hours. BNP (last 3 results) No results for input(s): PROBNP in the last 8760 hours. HbA1C: No results for input(s): HGBA1C in the last 72 hours. CBG: Recent Labs  Lab 01/25/17 1632 01/25/17 2108 01/26/17 0626 01/26/17 1151 01/26/17 1429  GLUCAP 252* 191* 175* 136* 117*   Lipid Profile: No results for input(s): CHOL, HDL, LDLCALC, TRIG, CHOLHDL, LDLDIRECT in the last 72 hours. Thyroid Function Tests: No results for input(s): TSH, T4TOTAL, FREET4, T3FREE, THYROIDAB in the last 72 hours. Anemia Panel: No results for input(s): VITAMINB12, FOLATE, FERRITIN, TIBC, IRON, RETICCTPCT in the last 72 hours. Urine analysis:    Component Value Date/Time   COLORURINE ORANGE (A) 01/23/2017 2240   APPEARANCEUR CLEAR 01/23/2017 2240   LABSPEC 1.025 01/23/2017 2240   PHURINE 5.0 01/23/2017 2240   GLUCOSEU >=500 (A) 01/23/2017 2240   HGBUR NEGATIVE 01/23/2017 2240   BILIRUBINUR NEGATIVE 01/23/2017 2240   BILIRUBINUR Negative 01/21/2017 [place] 01/23/2017 2240   PROTEINUR >=300 (A) 01/23/2017 2240   UROBILINOGEN 0.2 01/21/2017 1044   UROBILINOGEN 0.2 08/28/2014 1555   NITRITE POSITIVE (A) 01/23/2017 2240   LEUKOCYTESUR NEGATIVE 01/23/2017 2240     Recent Results (from the past 240 hour(s))  Culture, Urine     Status: None   Collection Time: 01/24/17  5:47 PM  Result Value Ref Range Status   Specimen Description URINE, CLEAN CATCH  Final   Special Requests NONE  Final   Culture NO GROWTH  Final   Report Status 01/25/2017 FINAL  Final      Anti-infectives (From admission, onward)   Start     Dose/Rate Route Frequency Ordered Stop   01/24/17  2200  cefTRIAXone (ROCEPHIN) 1 g in dextrose 5 % 50 mL IVPB     1 g 100 mL/hr over 30 Minutes Intravenous Every 24 hours 01/24/17 0209     01/23/17 2315  cefTRIAXone (ROCEPHIN) 1 g in dextrose 5 % 50 mL IVPB     1 g 100 mL/hr over 30 Minutes Intravenous  Once 01/23/17 2309 01/24/17 0157       Radiology Studies: No results found.      Scheduled Meds: . aspirin  325 mg Oral Daily  . heparin  5,000 Units Subcutaneous Q8H  . insulin aspart  0-15 Units Subcutaneous TID AC & HS  . insulin aspart  4 Units Subcutaneous TID WC  . insulin detemir  20 Units Subcutaneous QHS  . isosorbide mononitrate  60 mg Oral Daily  . levETIRAcetam  1,000 mg Oral BID  . methocarbamol  500  mg Oral TID  . metoprolol tartrate  50 mg Oral BID  . potassium chloride SA  20 mEq Oral Daily  . prasugrel  10 mg Oral Daily  . rosuvastatin  10 mg Oral Daily  . sertraline  100 mg Oral q morning - 10a   Continuous Infusions: . cefTRIAXone (ROCEPHIN)  IV Stopped (01/25/17 2227)     LOS: 2 days    Time spent: 25 min    Geradine Girt, DO [facility] Pager [phone]  If 7PM-7AM, please contact night-coverage [email or web address] Password [facility] 01/26/2017, 4:31 PM

## 2017-01-27 DIAGNOSIS — M545 Low back pain: Secondary | ICD-10-CM

## 2017-01-27 DIAGNOSIS — E119 Type 2 diabetes mellitus without complications: Secondary | ICD-10-CM

## 2017-01-27 LAB — LEVETIRACETAM LEVEL: Keppra (Levetiracetam): 113.8 ug/mL

## 2017-01-27 LAB — GLUCOSE, CAPILLARY
Glucose-Capillary: 181 mg/dL — ABNORMAL HIGH (ref 65–99)
Glucose-Capillary: 179 mg/dL — ABNORMAL HIGH (ref 65–99)

## 2017-01-27 MED ORDER — METHOCARBAMOL 500 MG PO TABS: 500.0000 mg | ORAL_TABLET | Freq: Three times a day (TID) | ORAL | 0 refills | Status: DC

## 2017-01-27 MED ORDER — HYDROCODONE-ACETAMINOPHEN 5-325 MG PO TABS: 1.0000 | ORAL_TABLET | Freq: Four times a day (QID) | ORAL | 0 refills | Status: DC | PRN

## 2017-01-27 MED ORDER — POLYETHYLENE GLYCOL 3350 17 G PO PACK: 17.0000 g | PACK | Freq: Every day | ORAL | 0 refills | Status: DC | PRN

## 2017-01-27 MED ORDER — INSULIN ASPART 100 UNIT/ML ~~LOC~~ SOLN: 4.0000 [IU] | Freq: Three times a day (TID) | SUBCUTANEOUS | 11 refills | Status: DC

## 2017-01-27 MED ORDER — INSULIN DETEMIR 100 UNIT/ML ~~LOC~~ SOLN: 20.0000 [IU] | Freq: Every day | SUBCUTANEOUS | 11 refills | Status: DC

## 2017-01-27 NOTE — Progress Notes (Signed)
Physical Therapy Treatment Patient Details Name: [name] MRN: [id number] DOB: 07-29-1945 Today's Date: 01/27/2017    History of Present Illness This is a 72 year old obese woman with self-reported coronary artery disease status post MI in 2011 underwent ablative therapy, diastolic heart failure, chronic hypoxic respiratory failure, insulin-dependent diabetes who is presenting with back pain.    PT Comments    Pt progressing towards physical therapy goals. Was adamant that she was not ambulating outside of the room today, and reported almost immediate pain in low back once ambulating. Treatment was limited by pain overall. Pt was educated on safe activity progression and encouraged walking program at home. Pt anticipates d/c home this afternoon. Will continue to follow.    Follow Up Recommendations  Home health PT     Equipment Recommendations  None recommended by PT    Recommendations for Other Services       Precautions / Restrictions Precautions Precautions: Fall Restrictions Weight Bearing Restrictions: No    Mobility  Bed Mobility Overal bed mobility: Needs Assistance Bed Mobility: Supine to Sit;Sit to Supine     Supine to sit: Supervision Sit to supine: Min assist   General bed mobility comments: Patient required minimal assistance with getting legs onto bed.   Transfers Overall transfer level: Needs assistance Equipment used: Rolling walker (2 wheeled) Transfers: Sit to/from Stand Sit to Stand: Supervision         General transfer comment: Patient demonstrated proper use of RW with hand placement on bed and was able to power up to full stand without assistance. Once standing no signs of pain were noted.   Ambulation/Gait Ambulation/Gait assistance: Min guard Ambulation Distance (Feet): 35 Feet Assistive device: Rolling walker (2 wheeled) Gait Pattern/deviations: Step-through pattern;Trendelenburg;Wide base of support Gait velocity: decreased Gait  velocity interpretation: Below normal speed for age/gender General Gait Details: Patient was reluctant to leave room to work on ambulation. While in the room, patient started to complain of back pain and returned to the bed.    Stairs            Wheelchair Mobility    Modified Rankin (Stroke Patients Only)       Balance Overall balance assessment: History of Falls;Needs assistance Sitting-balance support: No upper extremity supported Sitting balance-Leahy Scale: Good     Standing balance support: Bilateral upper extremity supported Standing balance-Leahy Scale: Poor Standing balance comment: reliant on UE support                            Cognition Arousal/Alertness: Awake/alert Behavior During Therapy: Anxious Overall Cognitive Status: Within Functional Limits for tasks assessed                                        Exercises      General Comments General comments (skin integrity, edema, etc.): Patient educated on stair navigation at home and starting to progress mobility at home inlcuding a generalized walking program.      Pertinent Vitals/Pain Pain Assessment: Faces Faces Pain Scale: Hurts even more Pain Location: Low back  Pain Descriptors / Indicators: Grimacing;Guarding Pain Intervention(s): Limited activity within patient's tolerance    Home Living                      Prior Function  PT Goals (current goals can now be found in the care plan section) Acute Rehab PT Goals Patient Stated Goal: Go home today PT Goal Formulation: With patient Time For Goal Achievement: 02/08/17 Potential to Achieve Goals: Good Progress towards PT goals: Progressing toward goals    Frequency    Min 3X/week      PT Plan Current plan remains appropriate    Co-evaluation              AM-PAC PT "6 Clicks" Daily Activity  Outcome Measure  Difficulty turning over in bed (including adjusting bedclothes,  sheets and blankets)?: A Little Difficulty moving from lying on back to sitting on the side of the bed? : Unable Difficulty sitting down on and standing up from a chair with arms (e.g., wheelchair, bedside commode, etc,.)?: A Little Help needed moving to and from a bed to chair (including a wheelchair)?: A Little Help needed walking in hospital room?: A Little Help needed climbing 3-5 steps with a railing? : A Lot 6 Click Score: 15    End of Session Equipment Utilized During Treatment: Gait belt;Oxygen(2 L) Activity Tolerance: Patient limited by pain Patient left: in bed;with call bell/phone within reach Nurse Communication: Mobility status PT Visit Diagnosis: History of falling (Z91.81);Pain;Other abnormalities of gait and mobility (R26.89) Pain - part of body: (back)     Time: 6659-9357 PT Time Calculation (min) (ACUTE ONLY): 15 min  Charges:  $Gait Training: 8-22 mins                    G Codes:       Rolinda Roan, PT, DPT Acute Rehabilitation Services Pager: [place] 01/27/2017, 2:46 PM

## 2017-01-27 NOTE — Discharge Summary (Signed)
Physician Discharge Summary  [name] OPF:[id number] DOB: 1945/01/14 DOA: 01/23/2017  PCP: Briscoe Deutscher, DO  Admit date: 01/23/2017 Discharge date: 01/27/2017   Recommendations for Outpatient Follow-Up:   1. Continue blood sugar monitoring-- titrate medications for better coverage 2. Home health PT/RN   Discharge Diagnosis:   Active Problems:   Pyelonephritis   Discharge disposition:  Home  Discharge Condition: Improved.  Diet recommendation: Low sodium, heart healthy.  Carbohydrate-modified  Wound care: None.   History of Present Illness:   This is a 72 year old obese woman with self-reported coronary artery disease status post MI in 2011 underwent ablative therapy, diastolic heart failure, chronic hypoxic respiratory failure, insulin-dependent diabetes who is presenting with back pain.  She was evaluated in clinic to 3 days prior to admission where she will status with UTI, given 1 dose of ceftriaxone and prescribed by mouth antimicrobial therapy, however patient did not pick up the by mouth antimicrobial for unclear reasons. She's had persistent bilateral lower back pain associated with some dysuria that has improved, due to persistent pain and she sought medical attention. She reports this is not felt during prior kidney infections. She reports some subjective fever, no frank hematuria.    She lives at home with her husband, his sleep primary please refer to grandchildren with whom 56 whose ages are 42 and 72 years of age, she does not smoke. She does not use any assist devices to ambulate.     Hospital Course by Problem:   Pyelonephritis, suspected -culture not done before abx -IV rocephin and then pateint has 5 days of keflex at home per husband (did not pick up on Saturday as [facility] did not call and tell him there was a prescription)-- will complete this course  L3 mild compression fx -anterior wedge -ambulate -pain control and robaxin for muscle  relaxation  Insulin dependent DM with hyperglycemia:  -SSI-- add meal time coverage if eating >50% -increase long-acting  CAD and hx of chronic diastolic HF: continue chronic dual antiplatelet therapy, statin, BB  Chronic hypoxic respiratory failure: continue chronic home O2 requirement, 2 L  Anxiety: stable, continue SSRI  Hx of seizure disorder: continue home AED  Hypokalemia:  -repleted      Medical Consultants:    None.   Discharge Exam:   Vitals:   01/27/17 0401 01/27/17 0749  BP: (!) 144/67   Pulse: 67   Resp: 18   Temp: 97.6 F (36.4 C)   SpO2: 99% 99%   Vitals:   01/26/17 1448 01/26/17 2132 01/27/17 0401 01/27/17 0749  BP: (!) 153/77 111/70 (!) 144/67   Pulse: 75 78 67   Resp:  18 18   Temp:  98 F (36.7 C) 97.6 F (36.4 C)   TempSrc:  Oral Oral   SpO2:  92% 99% 99%    Gen:  NAD- pain better- walked to the bathroom x 2 this AM without issues    The results of significant diagnostics from this hospitalization (including imaging, microbiology, ancillary and laboratory) are listed below for reference.     Procedures and Diagnostic Studies:   Dg Lumbar Spine Complete  Result Date: 01/23/2017 CLINICAL DATA:  Pain without trauma EXAM: LUMBAR SPINE - COMPLETE 4+ VIEW COMPARISON:  None. FINDINGS: There is anterior wedging of L3 which is new compared to March 27, 2009. No other fractures. No malalignment. Mild multilevel degenerative changes. IMPRESSION: Anterior wedging of L3 with approximately 10-15% loss of anterior height is new since 2011 and age indeterminate. An  acute compression fracture cannot be excluded on this study. Electronically Signed   By: Dorise Bullion III M.D   On: 01/23/2017 22:06   Dg Pelvis 1-2 Views  Result Date: 01/23/2017 CLINICAL DATA:  Low back pain.  No trauma. EXAM: PELVIS - 1-2 VIEW COMPARISON:  None. FINDINGS: Limited views of the sacrum are unremarkable. The iliac bones are intact. The left hip is not well assessed  due to positioning. No obvious hip fracture is noted. IMPRESSION: Left hip is not well evaluated due to positioning. No fractures identified within the pelvis. Electronically Signed   By: Dorise Bullion III M.D   On: 01/23/2017 22:04     Labs:   Basic Metabolic Panel: Recent Labs  Lab 01/21/17 1027 01/23/17 2012 01/24/17 0303 01/25/17 0649  NA 136 136 137 139  K 3.2* 3.3* 3.3* 3.4*  CL 97 99* 102 103  CO2 30 25 23 24   GLUCOSE 338* 387* 345* 229*  BUN 25* 27* 29* 30*  CREATININE 0.94 1.01* 0.97 1.06*  CALCIUM 8.7 9.3 8.7* 8.9   GFR Estimated Creatinine Clearance: 53.5 mL/min (A) (by C-G formula based on SCr of 1.06 mg/dL (H)). Liver Function Tests: Recent Labs  Lab 01/21/17 1027  AST 20  ALT 11  ALKPHOS 83  BILITOT 0.4  PROT 7.1  ALBUMIN 3.5   No results for input(s): LIPASE, AMYLASE in the last 168 hours. No results for input(s): AMMONIA in the last 168 hours. Coagulation profile No results for input(s): INR, PROTIME in the last 168 hours.  CBC: Recent Labs  Lab 01/21/17 1027 01/23/17 2012 01/24/17 0303 01/25/17 0649  WBC 9.3 10.3 8.9 7.6  NEUTROABS 7.3  --   --   --   HGB 12.1 12.2 10.9* 10.4*  HCT 36.5 36.8 33.0* 32.6*  MCV 95.9 95.1 95.7 96.7  PLT 407.0* 372 316 326   Cardiac Enzymes: No results for input(s): CKTOTAL, CKMB, CKMBINDEX, TROPONINI in the last 168 hours. BNP: Invalid input(s): POCBNP CBG: Recent Labs  Lab 01/26/17 1151 01/26/17 1429 01/26/17 1638 01/26/17 2137 01/27/17 0625  GLUCAP 136* 117* 93 203* 181*   D-Dimer No results for input(s): DDIMER in the last 72 hours. Hgb A1c No results for input(s): HGBA1C in the last 72 hours. Lipid Profile No results for input(s): CHOL, HDL, LDLCALC, TRIG, CHOLHDL, LDLDIRECT in the last 72 hours. Thyroid function studies No results for input(s): TSH, T4TOTAL, T3FREE, THYROIDAB in the last 72 hours.  Invalid input(s): FREET3 Anemia work up No results for input(s): VITAMINB12, FOLATE,  FERRITIN, TIBC, IRON, RETICCTPCT in the last 72 hours. Microbiology Recent Results (from the past 240 hour(s))  Culture, Urine     Status: None   Collection Time: 01/24/17  5:47 PM  Result Value Ref Range Status   Specimen Description URINE, CLEAN CATCH  Final   Special Requests NONE  Final   Culture NO GROWTH  Final   Report Status 01/25/2017 FINAL  Final     Discharge Instructions:   Discharge Instructions    Diet - low sodium heart healthy   Complete by:  As directed    Diet Carb Modified   Complete by:  As directed    Discharge instructions   Complete by:  As directed    Resume home O2 Home health RN-- diabetes   Increase activity slowly   Complete by:  As directed      Allergies as of 01/27/2017      Reactions   Codeine Itching  Medication List    STOP taking these medications   phenazopyridine 100 MG tablet Commonly known as:  PYRIDIUM     TAKE these medications   acetaminophen 325 MG tablet Commonly known as:  TYLENOL Take 2 tablets (650 mg total) by mouth every 4 (four) hours as needed for headache or mild pain.   albuterol 108 (90 Base) MCG/ACT inhaler Commonly known as:  PROVENTIL HFA;VENTOLIN HFA Inhale 2 puffs into the lungs every 6 (six) hours as needed for wheezing or shortness of breath.   ALPRAZolam 0.5 MG tablet Commonly known as:  XANAX Take 1 tablet (0.5 mg total) by mouth 2 (two) times daily.   aspirin 325 MG EC tablet Take 1 tablet (325 mg total) by mouth daily.   cephALEXin 500 MG capsule Commonly known as:  KEFLEX Take 1 capsule (500 mg total) by mouth 3 (three) times daily for 5 days.   furosemide 40 MG tablet Commonly known as:  LASIX Take 20 mg by mouth daily.   HYDROcodone-acetaminophen 5-325 MG tablet Commonly known as:  NORCO/VICODIN Take 1 tablet by mouth every 6 (six) hours as needed for moderate pain.   hydrOXYzine 10 MG tablet Commonly known as:  ATARAX/VISTARIL Take 1 tablet (10 mg total) by mouth 2 (two) times  daily as needed for itching.   insulin aspart 100 UNIT/ML injection Commonly known as:  novoLOG Inject 0-9 Units into the skin 3 (three) times daily with meals. Sliding scale CBG 70 - 120: 0 units CBG 121 - 150: 1 unit,  CBG 151 - 200: 2 units,  CBG 201 - 250: 3 units,  CBG 251 - 300: 5 units,  CBG 301 - 350: 7 units,  CBG 351 - 400: 9 units   CBG > 400: 9 units and notify your MD What changed:  Another medication with the same name was added. Make sure you understand how and when to take each.   insulin aspart 100 UNIT/ML injection Commonly known as:  novoLOG Inject 4 Units into the skin 3 (three) times daily with meals. What changed:  You were already taking a medication with the same name, and this prescription was added. Make sure you understand how and when to take each.   insulin detemir 100 UNIT/ML injection Commonly known as:  LEVEMIR Inject 0.2 mLs (20 Units total) into the skin at bedtime. What changed:  how much to take   isosorbide mononitrate 60 MG 24 hr tablet Commonly known as:  IMDUR Take 60 mg by mouth daily.   levETIRAcetam 1000 MG tablet Commonly known as:  KEPPRA Take 1 tablet (1,000 mg total) by mouth 2 (two) times daily.   methocarbamol 500 MG tablet Commonly known as:  ROBAXIN Take 1 tablet (500 mg total) by mouth 3 (three) times daily.   metoprolol tartrate 50 MG tablet Commonly known as:  LOPRESSOR Take 50 mg by mouth 2 (two) times daily.   nitroGLYCERIN 0.4 MG SL tablet Commonly known as:  NITROSTAT Place 0.4 mg under the tongue every 5 (five) minutes as needed for chest pain.   polyethylene glycol packet Commonly known as:  MIRALAX / GLYCOLAX Take 17 g by mouth daily as needed for mild constipation.   potassium chloride SA 20 MEQ tablet Commonly known as:  K-DUR,KLOR-CON Take 20 mEq by mouth daily.   prasugrel 10 MG Tabs tablet Commonly known as:  EFFIENT Take 10 mg by mouth daily.   rizatriptan 10 MG tablet Commonly known as:  MAXALT Take  1  tablet (10 mg total) by mouth as needed for migraine. May repeat in 2 hours if needed   rosuvastatin 10 MG tablet Commonly known as:  CRESTOR Take 1 tablet (10 mg total) by mouth daily.   sertraline 100 MG tablet Commonly known as:  ZOLOFT Take 1 tablet (100 mg total) by mouth every morning.      Follow-up Information    Briscoe Deutscher, DO Follow up in 1 week(s).   Specialty:  Family Medicine Contact information: [place] [address] [phone]            Time coordinating discharge: 35 min  Signed:  Geradine Girt   [facility] 01/27/2017, 11:32 AM

## 2017-01-27 NOTE — Discharge Instructions (Signed)
Add 4 units of novolog to your sliding scale if you eat > 50% of your meal

## 2017-01-28 ENCOUNTER — Telehealth: Payer: Self-pay | Admitting: *Deleted

## 2017-01-28 ENCOUNTER — Encounter: Payer: Self-pay | Admitting: *Deleted

## 2017-01-28 NOTE — Care Management Note (Signed)
Case Management Note  Patient Details  Name: [name] MRN: [id number] Date of Birth: [birth year]  Subjective/Objective:                    Action/Plan: Patient said she has used [facility] in the past and is requesting to do do now. Case Manager called referral to Neoma Laming, [place] Liaison.    Expected Discharge Date:  01/27/17               Expected Discharge Plan:  [place]  In-House Referral:  NA  Discharge planning Services  CM Consult  Post Acute Care Choice:  Home Health Choice offered to:  Patient  DME Arranged:  N/A DME Agency:  NA  HH Arranged:  RN [place] Agency:  [facility]  Status of Service:  Completed, signed off  If discussed at [place] of Stay Meetings, dates discussed:    Additional Comments:  Ninfa Meeker, RN 01/28/2017, 9:44 AM

## 2017-01-28 NOTE — Telephone Encounter (Signed)
Per chart review: Admit date: 01/23/2017 Discharge date: 01/27/2017   Recommendations for Outpatient Follow-Up:   1. Continue blood sugar monitoring-- titrate medications for better coverage 2. Home health PT/RN   Discharge Diagnosis:   Active Problems:   Pyelonephritis   Discharge disposition:  Home  Discharge Condition: Improved.  Diet recommendation: Low sodium, heart healthy.  Carbohydrate-modified  Wound care: None. __________________________________________________________________ Per telephone call: Transition Care Management Follow-up Telephone Call   Date discharged? 01/27/17   How have you been since you were released from the hospital? "better, but still hurts to walk"   Do you understand why you were in the hospital? yes   Do you understand the discharge instructions? yes   Where were you discharged to? Home   Items Reviewed:  Medications reviewed: yes  Allergies reviewed: yes  Dietary changes reviewed: yes  Referrals reviewed: yes   Functional Questionnaire:   Activities of Daily Living (ADLs):   She states they are independent in the following: ambulation, bathing and hygiene, feeding, continence, grooming, toileting and dressing States they require assistance with the following: none.   Any transportation issues/concerns?: no   Any patient concerns? no   Confirmed importance and date/time of follow-up visits scheduled yes  Provider Appointment booked with Dr Juleen China 02/03/17 9:20  Confirmed with patient if condition begins to worsen call PCP or go to the ER.  Patient was given the office number and encouraged to call back with question or concerns.  : yes

## 2017-01-31 ENCOUNTER — Other Ambulatory Visit: Payer: Self-pay | Admitting: Family Medicine

## 2017-01-31 ENCOUNTER — Telehealth: Payer: Self-pay | Admitting: Family Medicine

## 2017-01-31 DIAGNOSIS — R300 Dysuria: Secondary | ICD-10-CM

## 2017-01-31 MED ORDER — HYDROCODONE-ACETAMINOPHEN 5-325 MG PO TABS: 1.0000 | ORAL_TABLET | Freq: Three times a day (TID) | ORAL | 0 refills | Status: DC | PRN

## 2017-01-31 NOTE — Telephone Encounter (Signed)
I have tried to call husbands number with no answer. Called patients phone number and left message on her phone that prescription is at the pharmacy. She would need to take sparingly.

## 2017-01-31 NOTE — Telephone Encounter (Signed)
Spouse checking status, also requesting robaxin 500mg . Please advise

## 2017-01-31 NOTE — Telephone Encounter (Signed)
See request °

## 2017-01-31 NOTE — Telephone Encounter (Signed)
Spoke with patients husband and gave message. He would like something for pain just to last until her appointment on Thursday. He does not want to give her something over the counter.

## 2017-01-31 NOTE — Telephone Encounter (Signed)
Copied from [place] [id number]. Topic: Inquiry >> Jan 31, 2017  3:36 PM Malena Catholic I, [place] wrote: Reason for CRM: Pt Husband call and state his need some pain med for his Wife in till 02/03/17 she have a app With Doctor Juleen China for [facility],she is and a lot of pain.Thanks

## 2017-01-31 NOTE — Telephone Encounter (Signed)
Copied from [place] [id number]. Topic: Quick Communication - See Telephone Encounter >> Jan 31, 2017 12:46 PM Vernona Rieger wrote: CRM for notification. See Telephone encounter for:   01/31/17.  Patient's husband called and wants to know if Dr Juleen China would refill her HYDROcodone-acetaminophen (NORCO/VICODIN) 5-325 MG tablet until she can be seen for her hospital f/u on Thursday. Please call patients husband @ [phone] He just wants her to have some until Thursday, she only has one left   [facility] [address] [place]

## 2017-01-31 NOTE — Telephone Encounter (Signed)
Please be advised. It appears that patient;s husband called and stated that the patient needs pain medication to last her until 1-31 for her appointment.

## 2017-01-31 NOTE — Telephone Encounter (Signed)
Please advise 

## 2017-01-31 NOTE — Telephone Encounter (Signed)
I have not been prescribing Norco or Robaxin. I discussed this with the patient at her first visit - I don't feel comfortable prescribing this to her. Okay STAT referral to Orthopedics if wanted.

## 2017-01-31 NOTE — Telephone Encounter (Signed)
Norco sent in. Use more sparingly. Plan is to get into Pain management if needed. No robaxin.

## 2017-02-01 ENCOUNTER — Telehealth: Payer: Self-pay | Admitting: Family Medicine

## 2017-02-01 NOTE — Telephone Encounter (Signed)
Notified patients husband that Dr. Juleen China would not be able to prescribe the Robaxin. Patients husband verbalized understanding.

## 2017-02-01 NOTE — Telephone Encounter (Signed)
See other open message regarding this. Meds have been called in and l/m with instructions for patient.

## 2017-02-01 NOTE — Telephone Encounter (Signed)
Copied from [place]. Topic: Quick Communication - See Telephone Encounter >> Feb 01, 2017  1:49 PM Conception Chancy, NT wrote: CRM for notification. See Telephone encounter for:  02/01/17.  Pt husband is calling and states when pt was in the hospital she was put on methocarbamol 500mg  and she runs out tonight. Pt husband said if she needs a refill of this to get her until her appt 02/03/17 he would like it called into [facility].

## 2017-02-01 NOTE — Telephone Encounter (Signed)
See note

## 2017-02-03 ENCOUNTER — Inpatient Hospital Stay: Payer: Medicare Other | Admitting: Family Medicine

## 2017-02-11 ENCOUNTER — Ambulatory Visit: Payer: Medicare Other | Admitting: Family Medicine

## 2017-02-14 ENCOUNTER — Encounter: Payer: Self-pay | Admitting: Family Medicine

## 2017-02-22 ENCOUNTER — Ambulatory Visit: Payer: Medicare Other | Admitting: Family Medicine

## 2017-03-04 ENCOUNTER — Telehealth: Payer: Self-pay | Admitting: Endocrinology

## 2017-03-04 NOTE — Telephone Encounter (Signed)
:    insulin detemir (LEVEMIR) flex pen only    Pharmacy:  [place], [place] - [address] DEA #:  --    Pharmacy Comments:  --

## 2017-03-07 ENCOUNTER — Telehealth: Payer: Self-pay | Admitting: Endocrinology

## 2017-03-07 MED ORDER — INSULIN DETEMIR 100 UNIT/ML ~~LOC~~ SOLN: 20.0000 [IU] | Freq: Every day | SUBCUTANEOUS | 11 refills | Status: DC

## 2017-03-07 NOTE — Telephone Encounter (Signed)
Pt's daughter [name] called in and asked that a RX for [place] Flex Pen be called to [facility] on 3/1//19. Pt is completely out of insulin. Please sent RX for the above asap.

## 2017-03-07 NOTE — Telephone Encounter (Signed)
Sent!

## 2017-03-08 ENCOUNTER — Other Ambulatory Visit: Payer: Self-pay

## 2017-03-08 MED ORDER — INSULIN DETEMIR 100 UNIT/ML ~~LOC~~ SOLN: 20.0000 [IU] | Freq: Every day | SUBCUTANEOUS | 11 refills | Status: DC

## 2017-03-08 NOTE — Telephone Encounter (Signed)
I have sent.

## 2017-03-22 ENCOUNTER — Ambulatory Visit (INDEPENDENT_AMBULATORY_CARE_PROVIDER_SITE_OTHER): Payer: Medicare Other | Admitting: Endocrinology

## 2017-03-22 ENCOUNTER — Encounter: Payer: Self-pay | Admitting: Endocrinology

## 2017-03-22 VITALS — BP 108/71 | HR 90 | Wt 229.6 lb

## 2017-03-22 DIAGNOSIS — E119 Type 2 diabetes mellitus without complications: Secondary | ICD-10-CM

## 2017-03-22 LAB — HEMOGLOBIN A1C: Hgb A1c MFr Bld: 10.4 % — ABNORMAL HIGH (ref 4.6–6.5)

## 2017-03-22 MED ORDER — INSULIN DETEMIR 100 UNIT/ML ~~LOC~~ SOLN: 20.0000 [IU] | SUBCUTANEOUS | 11 refills | Status: DC

## 2017-03-22 MED ORDER — GLUCOSE BLOOD VI STRP: 1.0000 | ORAL_STRIP | Freq: Every day | 3 refills | Status: DC

## 2017-03-22 NOTE — Progress Notes (Signed)
Subjective:    Patient ID: [name], female    DOB: 09/13/1945, 72 y.o.   MRN: [id number]  HPI Pt returns for f/u of diabetes mellitus: DM type: Insulin-requiring type 2.   Dx'ed: 2542 Complications: polyneuropathy, autonomic neuropathy, renal insuff,  CAD, PAD, and TIA.   Therapy: insulin since 2010 GDM: never DKA: never Severe hypoglycemia: never Pancreatitis: never Pancreatic imaging: normal on 2011 CT Other: she takes multiple daily injections.   Interval history: She still has frequent nausea.  She takes levemir, 10 units qhs, and no novolog.  no cbg record, but states cbg's vary from 47-200's.  It is lowest in the middle of the night.   Past Medical History:  Diagnosis Date  . Anxiety   . Coronary artery disease   . Depression   . Diabetes mellitus    insulin dependent  . Edema of lower extremity   . Esophageal stricture 2008   EGD  . Fatigue   . GERD (gastroesophageal reflux disease)   . Heart murmur   . Hiatal hernia 2008   EGD  . Hyperlipidemia   . Hypertension   . Knee pain   . Morbid obesity ([place])   . Nonproductive cough    chronic  . Orthopnea   . Seizure disorder ([place])   . SOB (shortness of breath)   . Syncope and collapse   . Tremor     Past Surgical History:  Procedure Laterality Date  . ABDOMINAL HYSTERECTOMY    . CARDIAC CATHETERIZATION  03/28/2009  . KNEE ARTHROSCOPY    . TRANSTHORACIC ECHOCARDIOGRAM     showed ef of 65% with no regional wall motion abnormalities    Social History   Socioeconomic History  . Marital status: Married    Spouse name: Not on file  . Number of children: 2  . Years of education: 22  . Highest education level: Not on file  Social Needs  . Financial resource strain: Not on file  . Food insecurity - worry: Not on file  . Food insecurity - inability: Not on file  . Transportation needs - medical: Not on file  . Transportation needs - non-medical: Not on file  Occupational History  . Occupation: Retired    Tobacco Use  . Smoking status: Former Smoker    Last attempt to quit: 06/02/1970    Years since quitting: 46.8  . Smokeless tobacco: Never Used  Substance and Sexual Activity  . Alcohol use: No  . Drug use: No  . Sexual activity: Not on file  Other Topics Concern  . Not on file  Social History Narrative   Lives at home with her husband.   Left-handed.   No caffeine use.    Current Outpatient Medications on File Prior to Visit  Medication Sig Dispense Refill  . acetaminophen (TYLENOL) 325 MG tablet Take 2 tablets (650 mg total) by mouth every 4 (four) hours as needed for headache or mild pain. 30 tablet 0  . albuterol (PROVENTIL HFA;VENTOLIN HFA) 108 (90 Base) MCG/ACT inhaler Inhale 2 puffs into the lungs every 6 (six) hours as needed for wheezing or shortness of breath.     . ALPRAZolam (XANAX) 0.5 MG tablet Take 1 tablet (0.5 mg total) by mouth 2 (two) times daily. 60 tablet 2  . aspirin EC 325 MG EC tablet Take 1 tablet (325 mg total) by mouth daily. 30 tablet 0  . furosemide (LASIX) 40 MG tablet Take 20 mg by mouth daily.     Marland Kitchen  HYDROcodone-acetaminophen (NORCO/VICODIN) 5-325 MG tablet Take 1 tablet by mouth 3 (three) times daily as needed for moderate pain. 20 tablet 0  . hydrOXYzine (ATARAX/VISTARIL) 10 MG tablet Take 1 tablet (10 mg total) by mouth 2 (two) times daily as needed for itching. 30 tablet 5  . isosorbide mononitrate (IMDUR) 60 MG 24 hr tablet Take 60 mg by mouth daily.     Marland Kitchen levETIRAcetam (KEPPRA) 1000 MG tablet Take 1 tablet (1,000 mg total) by mouth 2 (two) times daily. 60 tablet 0  . methocarbamol (ROBAXIN) 500 MG tablet Take 1 tablet (500 mg total) by mouth 3 (three) times daily. 15 tablet 0  . metoprolol (LOPRESSOR) 50 MG tablet Take 50 mg by mouth 2 (two) times daily.   0  . nitroGLYCERIN (NITROSTAT) 0.4 MG SL tablet Place 0.4 mg under the tongue every 5 (five) minutes as needed for chest pain.    . polyethylene glycol (MIRALAX / GLYCOLAX) packet Take 17 g by  mouth daily as needed for mild constipation. 14 each 0  . potassium chloride SA (K-DUR,KLOR-CON) 20 MEQ tablet Take 20 mEq by mouth daily.    . prasugrel (EFFIENT) 10 MG TABS tablet Take 10 mg by mouth daily.    . rizatriptan (MAXALT) 10 MG tablet Take 1 tablet (10 mg total) by mouth as needed for migraine. May repeat in 2 hours if needed 10 tablet 5  . rosuvastatin (CRESTOR) 10 MG tablet Take 1 tablet (10 mg total) by mouth daily. 90 tablet 3  . sertraline (ZOLOFT) 100 MG tablet Take 1 tablet (100 mg total) by mouth every morning. 90 tablet 3   No current facility-administered medications on file prior to visit.     Allergies  Allergen Reactions  . Codeine Itching    Family History  Problem Relation Age of Onset  . Heart attack Mother   . Hypertension Mother   . COPD Father   . Heart disease Sister   . Diabetes Brother   . Diabetes Brother   . Prostate cancer Brother   . Kidney disease Brother     BP 108/71 (BP Location: Left Arm, Patient Position: Sitting, Cuff Size: Normal)   Pulse 90   Wt 229 lb 9.6 oz (104.1 kg)   SpO2 97%   BMI 44.84 kg/m    Review of Systems Denies LOC    Objective:   Physical Exam VITAL SIGNS:  See vs page GENERAL: no distress.  In wheelchair.  Has 02 on.  Pulses: dorsalis pedis intact bilat.   MSK: no deformity of the feet CV: trace bilat leg edema Skin:  no ulcer on the feet.  normal color and temp on the feet. Neuro: sensation is intact to touch on the feet, but decreased from normal.   Lab Results  Component Value Date   CREATININE 1.06 (H) 01/25/2017   BUN 30 (H) 01/25/2017   NA 139 01/25/2017   K 3.4 (L) 01/25/2017   CL 103 01/25/2017   CO2 24 01/25/2017    Lab Results  Component Value Date   HGBA1C 10.4 (H) 03/22/2017      Assessment & Plan:  Type 2 DM, with PAD: worse Renal insuff: in this setting, he needs a fast-acting qd insulin.  Hypoglycemia: she needs to change levemir to the morning.   Increase levemir to 20  units qam.

## 2017-03-22 NOTE — Patient Instructions (Addendum)
check your blood sugar once a day.  vary the time of day when you check, between before the 3 meals, and at bedtime.  also check if you have symptoms of your blood sugar being too high or too low.  please keep a record of the readings and bring it to your next appointment here (or you can bring the meter itself).  You can write it on any piece of paper.  please call us sooner if your blood sugar goes below 70, or if you have a lot of readings over 200. blood tests are requested for you today.  We'll let you know about the results. Based on the results, I hope we can change the insulin back to pills.    Our goal will be to keep the blood sugar in a good range, without insulin, but first, we need to get your nausea under control.  Here is a new meter.  I have sent a prescription to your pharmacy, for strips Please come back for a follow-up appointment in 2 months.

## 2017-03-24 ENCOUNTER — Other Ambulatory Visit: Payer: Self-pay

## 2017-03-24 MED ORDER — GLUCOSE BLOOD VI STRP: 1.0000 | ORAL_STRIP | Freq: Every day | 3 refills | Status: DC

## 2017-03-25 ENCOUNTER — Telehealth: Payer: Self-pay | Admitting: Endocrinology

## 2017-03-25 ENCOUNTER — Other Ambulatory Visit: Payer: Self-pay

## 2017-03-25 MED ORDER — INSULIN DETEMIR 100 UNIT/ML FLEXPEN: PEN_INJECTOR | SUBCUTANEOUS | 11 refills | Status: DC

## 2017-03-25 NOTE — Telephone Encounter (Signed)
I called and notified patient I sent to pharmacy flextouch pens.

## 2017-03-25 NOTE — Telephone Encounter (Signed)
insulin detemir (LEVEMIR) 100 UNIT/ML injection      Patient states this should be a flex pen.  She does not [place] the vial.    Please advise    [place], [place] - [address]

## 2017-04-25 ENCOUNTER — Ambulatory Visit: Payer: Medicare Other | Admitting: Family Medicine

## 2017-04-25 DIAGNOSIS — Z029 Encounter for administrative examinations, unspecified: Secondary | ICD-10-CM

## 2017-04-25 NOTE — Progress Notes (Deleted)
[name] is a 72 y.o. female is here for follow up.  History of Present Illness:   HPI: Health Maintenance Due  Topic Date Due  . OPHTHALMOLOGY EXAM  04/20/1955  . URINE MICROALBUMIN  04/20/1955  . TETANUS/TDAP  04/19/1964  . DEXA SCAN  04/20/2010  . PNA vac Low Risk Adult (1 of 2 - PCV13) 04/20/2010   No flowsheet data found. PMHx, SurgHx, SocialHx, FamHx, Medications, and Allergies were reviewed in the Visit Navigator and updated as appropriate.   Patient Active Problem List   Diagnosis Date Noted  . Pyelonephritis 01/24/2017  . LV dysfunction 10/06/2016  . 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 Acute on chronic diastolic CHF (congestive heart failure) ([place]) 09/09/2016  . Elevated troponin 09/09/2016  . Migraine 08/20/2016  . Morbid obesity ([place]) 08/20/2016  . Anxiety 10/02/2015  . DOE (dyspnea on exertion) 10/02/2015  . Dyslipidemia 10/02/2015  . Nonrheumatic aortic valve stenosis 10/02/2015  . Old MI (myocardial infarction) 10/02/2015  . Insomnia 11/03/2014  . Weakness generalized 08/28/2014  . Seizure disorder ([place]) 08/28/2014  . Diabetes mellitus without complication ([place]) 76/16/0737  . Coronary artery disease of native artery of native heart with stable angina pectoris ([place]) 06/02/2010  . Depression 05/20/2007  . Essential hypertension 05/20/2007  . Diaphragmatic hernia 05/20/2007  . Diverticulosis of colon 05/20/2007  . IBS 05/20/2007  . Fatty liver 05/20/2007  . Hypothyroidism 05/20/2007  . Hx of transient ischemic attack (TIA) 05/20/2007   Social History   Tobacco Use  . Smoking status: Former Smoker    Last attempt to quit: 06/02/1970    Years since quitting: 46.9  . Smokeless tobacco: Never Used  Substance Use Topics  . Alcohol use: No  . Drug use: No   Current Medications and Allergies:   Current Outpatient Medications:  .  acetaminophen (TYLENOL) 325 MG tablet, Take 2 tablets  (650 mg total) by mouth every 4 (four) hours as needed for headache or mild pain., Disp: 30 tablet, Rfl: 0 .  albuterol (PROVENTIL HFA;VENTOLIN HFA) 108 (90 Base) MCG/ACT inhaler, Inhale 2 puffs into the lungs every 6 (six) hours as needed for wheezing or shortness of breath. , Disp: , Rfl:  .  ALPRAZolam (XANAX) 0.5 MG tablet, Take 1 tablet (0.5 mg total) by mouth 2 (two) times daily., Disp: 60 tablet, Rfl: 2 .  aspirin EC 325 MG EC tablet, Take 1 tablet (325 mg total) by mouth daily., Disp: 30 tablet, Rfl: 0 .  furosemide (LASIX) 40 MG tablet, Take 20 mg by mouth daily. , Disp: , Rfl:  .  glucose blood (ONETOUCH VERIO) test strip, 1 each by Other route daily. And lancets 1/day. Dx code E11.9., Disp: 100 each, Rfl: 3 .  HYDROcodone-acetaminophen (NORCO/VICODIN) 5-325 MG tablet, Take 1 tablet by mouth 3 (three) times daily as needed for moderate pain., Disp: 20 tablet, Rfl: 0 .  hydrOXYzine (ATARAX/VISTARIL) 10 MG tablet, Take 1 tablet (10 mg total) by mouth 2 (two) times daily as needed for itching., Disp: 30 tablet, Rfl: 5 .  Insulin Detemir (LEVEMIR FLEXTOUCH) 100 UNIT/ML Pen, Inject 20 units into the skin every morning., Disp: 15 mL, Rfl: 11 .  isosorbide mononitrate (IMDUR) 60 MG 24 hr tablet, Take 60 mg by mouth daily. , Disp: , Rfl:  .  levETIRAcetam (KEPPRA) 1000 MG tablet, Take 1 tablet (1,000 mg total) by mouth 2 (two) times daily., Disp: 60 tablet, Rfl: 0 .  methocarbamol (  ROBAXIN) 500 MG tablet, Take 1 tablet (500 mg total) by mouth 3 (three) times daily., Disp: 15 tablet, Rfl: 0 .  metoprolol (LOPRESSOR) 50 MG tablet, Take 50 mg by mouth 2 (two) times daily. , Disp: , Rfl: 0 .  nitroGLYCERIN (NITROSTAT) 0.4 MG SL tablet, Place 0.4 mg under the tongue every 5 (five) minutes as needed for chest pain., Disp: , Rfl:  .  polyethylene glycol (MIRALAX / GLYCOLAX) packet, Take 17 g by mouth daily as needed for mild constipation., Disp: 14 each, Rfl: 0 .  potassium chloride SA (K-DUR,KLOR-CON) 20  MEQ tablet, Take 20 mEq by mouth daily., Disp: , Rfl:  .  prasugrel (EFFIENT) 10 MG TABS tablet, Take 10 mg by mouth daily., Disp: , Rfl:  .  rizatriptan (MAXALT) 10 MG tablet, Take 1 tablet (10 mg total) by mouth as needed for migraine. May repeat in 2 hours if needed, Disp: 10 tablet, Rfl: 5 .  rosuvastatin (CRESTOR) 10 MG tablet, Take 1 tablet (10 mg total) by mouth daily., Disp: 90 tablet, Rfl: 3 .  sertraline (ZOLOFT) 100 MG tablet, Take 1 tablet (100 mg total) by mouth every morning., Disp: 90 tablet, Rfl: 3  Allergies  Allergen Reactions  . Codeine Itching   Review of Systems   Pertinent items are noted in the HPI. Otherwise, ROS is negative.  Vitals:  There were no vitals filed for this visit.   There is no height or weight on file to calculate BMI. Physical Exam:   Physical Exam Results for orders placed or performed in visit on 03/22/17  Hemoglobin A1c  Result Value Ref Range   Hgb A1c MFr Bld 10.4 (H) 4.6 - 6.5 %    Assessment and Plan:   There are no diagnoses linked to this encounter.  . Reviewed expectations re: course of current medical issues. . Discussed self-management of symptoms. . Outlined signs and symptoms indicating need for more acute intervention. . Patient verbalized understanding and all questions were answered. Marland Kitchen Health Maintenance issues including appropriate healthy diet, exercise, and smoking avoidance were discussed with patient. . See orders for this visit as documented in the electronic medical record. . Patient received an After Visit Summary.  Briscoe Deutscher, DO [place] 04/25/2017  Future Appointments  Date Time Provider [place]  04/25/2017  9:20 AM Briscoe Deutscher, DO [facility]  05/23/2017  9:45 AM Renato Shin, MD [facility] None

## 2017-04-27 ENCOUNTER — Encounter: Payer: Self-pay | Admitting: Family Medicine

## 2017-05-16 ENCOUNTER — Telehealth: Payer: Self-pay

## 2017-05-16 NOTE — Telephone Encounter (Signed)
Go ahead and call to see if she would like to make an appointment. She may be going elsewhere or wish to change physicians.

## 2017-05-16 NOTE — Telephone Encounter (Signed)
Fax received from [facility] that patient has not filled Crestor. Looks like last office visit was 01/21/17 and a no show letter was mailed on 04/27/17

## 2017-05-17 NOTE — Telephone Encounter (Signed)
Patient states she is getting meds at another pharmacy. Offered to make app for follow up but she did not want to make at this time.

## 2017-05-23 ENCOUNTER — Ambulatory Visit: Payer: Medicare Other | Admitting: Endocrinology

## 2017-06-20 DIAGNOSIS — I130 Hypertensive heart and chronic kidney disease with heart failure and stage 1 through stage 4 chronic kidney disease, or unspecified chronic kidney disease: Secondary | ICD-10-CM | POA: Diagnosis present

## 2017-06-20 DIAGNOSIS — I5023 Acute on chronic systolic (congestive) heart failure: Secondary | ICD-10-CM | POA: Diagnosis present

## 2017-06-20 DIAGNOSIS — R3912 Poor urinary stream: Secondary | ICD-10-CM | POA: Diagnosis not present

## 2017-06-20 DIAGNOSIS — I5020 Unspecified systolic (congestive) heart failure: Secondary | ICD-10-CM | POA: Diagnosis not present

## 2017-06-20 DIAGNOSIS — I959 Hypotension, unspecified: Secondary | ICD-10-CM | POA: Diagnosis not present

## 2017-06-20 DIAGNOSIS — R4182 Altered mental status, unspecified: Secondary | ICD-10-CM | POA: Diagnosis not present

## 2017-06-20 DIAGNOSIS — I214 Non-ST elevation (NSTEMI) myocardial infarction: Secondary | ICD-10-CM | POA: Diagnosis present

## 2017-06-20 DIAGNOSIS — I350 Nonrheumatic aortic (valve) stenosis: Secondary | ICD-10-CM | POA: Diagnosis not present

## 2017-06-20 DIAGNOSIS — R278 Other lack of coordination: Secondary | ICD-10-CM | POA: Diagnosis not present

## 2017-06-20 DIAGNOSIS — I129 Hypertensive chronic kidney disease with stage 1 through stage 4 chronic kidney disease, or unspecified chronic kidney disease: Secondary | ICD-10-CM | POA: Diagnosis not present

## 2017-06-20 DIAGNOSIS — I25118 Atherosclerotic heart disease of native coronary artery with other forms of angina pectoris: Secondary | ICD-10-CM | POA: Diagnosis not present

## 2017-06-20 DIAGNOSIS — M2550 Pain in unspecified joint: Secondary | ICD-10-CM | POA: Diagnosis not present

## 2017-06-20 DIAGNOSIS — I272 Pulmonary hypertension, unspecified: Secondary | ICD-10-CM | POA: Diagnosis present

## 2017-06-20 DIAGNOSIS — F330 Major depressive disorder, recurrent, mild: Secondary | ICD-10-CM | POA: Diagnosis not present

## 2017-06-20 DIAGNOSIS — Z6841 Body Mass Index (BMI) 40.0 and over, adult: Secondary | ICD-10-CM | POA: Diagnosis not present

## 2017-06-20 DIAGNOSIS — E1129 Type 2 diabetes mellitus with other diabetic kidney complication: Secondary | ICD-10-CM | POA: Diagnosis not present

## 2017-06-20 DIAGNOSIS — Z95828 Presence of other vascular implants and grafts: Secondary | ICD-10-CM | POA: Diagnosis not present

## 2017-06-20 DIAGNOSIS — I700 Atherosclerosis of aorta: Secondary | ICD-10-CM | POA: Diagnosis present

## 2017-06-20 DIAGNOSIS — R9431 Abnormal electrocardiogram [ECG] [EKG]: Secondary | ICD-10-CM | POA: Diagnosis not present

## 2017-06-20 DIAGNOSIS — E119 Type 2 diabetes mellitus without complications: Secondary | ICD-10-CM | POA: Diagnosis not present

## 2017-06-20 DIAGNOSIS — E785 Hyperlipidemia, unspecified: Secondary | ICD-10-CM | POA: Diagnosis present

## 2017-06-20 DIAGNOSIS — N2889 Other specified disorders of kidney and ureter: Secondary | ICD-10-CM | POA: Diagnosis not present

## 2017-06-20 DIAGNOSIS — Z7982 Long term (current) use of aspirin: Secondary | ICD-10-CM | POA: Diagnosis not present

## 2017-06-20 DIAGNOSIS — N179 Acute kidney failure, unspecified: Secondary | ICD-10-CM | POA: Diagnosis not present

## 2017-06-20 DIAGNOSIS — E669 Obesity, unspecified: Secondary | ICD-10-CM | POA: Diagnosis not present

## 2017-06-20 DIAGNOSIS — J9611 Chronic respiratory failure with hypoxia: Secondary | ICD-10-CM | POA: Diagnosis present

## 2017-06-20 DIAGNOSIS — I509 Heart failure, unspecified: Secondary | ICD-10-CM | POA: Diagnosis not present

## 2017-06-20 DIAGNOSIS — I21A1 Myocardial infarction type 2: Secondary | ICD-10-CM | POA: Diagnosis present

## 2017-06-20 DIAGNOSIS — N182 Chronic kidney disease, stage 2 (mild): Secondary | ICD-10-CM | POA: Diagnosis not present

## 2017-06-20 DIAGNOSIS — R2689 Other abnormalities of gait and mobility: Secondary | ICD-10-CM | POA: Diagnosis not present

## 2017-06-20 DIAGNOSIS — M6281 Muscle weakness (generalized): Secondary | ICD-10-CM | POA: Diagnosis not present

## 2017-06-20 DIAGNOSIS — E1122 Type 2 diabetes mellitus with diabetic chronic kidney disease: Secondary | ICD-10-CM | POA: Diagnosis present

## 2017-06-20 DIAGNOSIS — I255 Ischemic cardiomyopathy: Secondary | ICD-10-CM | POA: Diagnosis not present

## 2017-06-20 DIAGNOSIS — Z955 Presence of coronary angioplasty implant and graft: Secondary | ICD-10-CM | POA: Diagnosis not present

## 2017-06-20 DIAGNOSIS — I639 Cerebral infarction, unspecified: Secondary | ICD-10-CM | POA: Diagnosis not present

## 2017-06-20 DIAGNOSIS — D649 Anemia, unspecified: Secondary | ICD-10-CM | POA: Diagnosis not present

## 2017-06-20 DIAGNOSIS — Z7401 Bed confinement status: Secondary | ICD-10-CM | POA: Diagnosis not present

## 2017-06-20 DIAGNOSIS — I2510 Atherosclerotic heart disease of native coronary artery without angina pectoris: Secondary | ICD-10-CM | POA: Diagnosis not present

## 2017-06-20 DIAGNOSIS — R0602 Shortness of breath: Secondary | ICD-10-CM | POA: Diagnosis not present

## 2017-06-20 DIAGNOSIS — I10 Essential (primary) hypertension: Secondary | ICD-10-CM | POA: Diagnosis not present

## 2017-06-20 DIAGNOSIS — I110 Hypertensive heart disease with heart failure: Secondary | ICD-10-CM | POA: Diagnosis not present

## 2017-06-20 DIAGNOSIS — F419 Anxiety disorder, unspecified: Secondary | ICD-10-CM | POA: Diagnosis not present

## 2017-06-20 DIAGNOSIS — R41841 Cognitive communication deficit: Secondary | ICD-10-CM | POA: Diagnosis not present

## 2017-06-20 DIAGNOSIS — K5900 Constipation, unspecified: Secondary | ICD-10-CM | POA: Diagnosis not present

## 2017-06-20 DIAGNOSIS — E1169 Type 2 diabetes mellitus with other specified complication: Secondary | ICD-10-CM | POA: Diagnosis not present

## 2017-06-20 DIAGNOSIS — Z7902 Long term (current) use of antithrombotics/antiplatelets: Secondary | ICD-10-CM | POA: Diagnosis not present

## 2017-06-20 DIAGNOSIS — Z4789 Encounter for other orthopedic aftercare: Secondary | ICD-10-CM | POA: Diagnosis not present

## 2017-06-20 DIAGNOSIS — R569 Unspecified convulsions: Secondary | ICD-10-CM | POA: Diagnosis present

## 2017-06-20 DIAGNOSIS — Z794 Long term (current) use of insulin: Secondary | ICD-10-CM | POA: Diagnosis not present

## 2017-06-20 DIAGNOSIS — R339 Retention of urine, unspecified: Secondary | ICD-10-CM | POA: Diagnosis present

## 2017-06-20 DIAGNOSIS — I252 Old myocardial infarction: Secondary | ICD-10-CM | POA: Diagnosis not present

## 2017-06-20 DIAGNOSIS — R079 Chest pain, unspecified: Secondary | ICD-10-CM | POA: Diagnosis not present

## 2017-06-20 DIAGNOSIS — N183 Chronic kidney disease, stage 3 (moderate): Secondary | ICD-10-CM | POA: Diagnosis present

## 2017-06-20 DIAGNOSIS — T82855A Stenosis of coronary artery stent, initial encounter: Secondary | ICD-10-CM | POA: Diagnosis present

## 2017-06-20 DIAGNOSIS — N189 Chronic kidney disease, unspecified: Secondary | ICD-10-CM | POA: Diagnosis not present

## 2017-06-20 DIAGNOSIS — R7989 Other specified abnormal findings of blood chemistry: Secondary | ICD-10-CM | POA: Diagnosis not present

## 2017-06-29 DIAGNOSIS — Z7982 Long term (current) use of aspirin: Secondary | ICD-10-CM | POA: Diagnosis not present

## 2017-06-29 DIAGNOSIS — M6281 Muscle weakness (generalized): Secondary | ICD-10-CM | POA: Diagnosis not present

## 2017-06-29 DIAGNOSIS — I130 Hypertensive heart and chronic kidney disease with heart failure and stage 1 through stage 4 chronic kidney disease, or unspecified chronic kidney disease: Secondary | ICD-10-CM | POA: Diagnosis not present

## 2017-06-29 DIAGNOSIS — M2550 Pain in unspecified joint: Secondary | ICD-10-CM | POA: Diagnosis not present

## 2017-06-29 DIAGNOSIS — Z90710 Acquired absence of both cervix and uterus: Secondary | ICD-10-CM | POA: Diagnosis not present

## 2017-06-29 DIAGNOSIS — I509 Heart failure, unspecified: Secondary | ICD-10-CM | POA: Diagnosis not present

## 2017-06-29 DIAGNOSIS — E669 Obesity, unspecified: Secondary | ICD-10-CM | POA: Diagnosis not present

## 2017-06-29 DIAGNOSIS — E1140 Type 2 diabetes mellitus with diabetic neuropathy, unspecified: Secondary | ICD-10-CM | POA: Diagnosis not present

## 2017-06-29 DIAGNOSIS — N170 Acute kidney failure with tubular necrosis: Secondary | ICD-10-CM | POA: Diagnosis present

## 2017-06-29 DIAGNOSIS — I639 Cerebral infarction, unspecified: Secondary | ICD-10-CM | POA: Diagnosis not present

## 2017-06-29 DIAGNOSIS — E875 Hyperkalemia: Secondary | ICD-10-CM | POA: Diagnosis present

## 2017-06-29 DIAGNOSIS — I2510 Atherosclerotic heart disease of native coronary artery without angina pectoris: Secondary | ICD-10-CM | POA: Diagnosis not present

## 2017-06-29 DIAGNOSIS — F330 Major depressive disorder, recurrent, mild: Secondary | ICD-10-CM | POA: Diagnosis not present

## 2017-06-29 DIAGNOSIS — R040 Epistaxis: Secondary | ICD-10-CM | POA: Diagnosis not present

## 2017-06-29 DIAGNOSIS — I959 Hypotension, unspecified: Secondary | ICD-10-CM | POA: Diagnosis not present

## 2017-06-29 DIAGNOSIS — R569 Unspecified convulsions: Secondary | ICD-10-CM | POA: Diagnosis not present

## 2017-06-29 DIAGNOSIS — J9611 Chronic respiratory failure with hypoxia: Secondary | ICD-10-CM | POA: Diagnosis present

## 2017-06-29 DIAGNOSIS — N178 Other acute kidney failure: Secondary | ICD-10-CM | POA: Diagnosis not present

## 2017-06-29 DIAGNOSIS — Z4789 Encounter for other orthopedic aftercare: Secondary | ICD-10-CM | POA: Diagnosis not present

## 2017-06-29 DIAGNOSIS — R0602 Shortness of breath: Secondary | ICD-10-CM | POA: Diagnosis not present

## 2017-06-29 DIAGNOSIS — E860 Dehydration: Secondary | ICD-10-CM | POA: Diagnosis present

## 2017-06-29 DIAGNOSIS — I5033 Acute on chronic diastolic (congestive) heart failure: Secondary | ICD-10-CM | POA: Diagnosis present

## 2017-06-29 DIAGNOSIS — Z794 Long term (current) use of insulin: Secondary | ICD-10-CM | POA: Diagnosis not present

## 2017-06-29 DIAGNOSIS — D649 Anemia, unspecified: Secondary | ICD-10-CM | POA: Diagnosis not present

## 2017-06-29 DIAGNOSIS — F419 Anxiety disorder, unspecified: Secondary | ICD-10-CM | POA: Diagnosis present

## 2017-06-29 DIAGNOSIS — I25118 Atherosclerotic heart disease of native coronary artery with other forms of angina pectoris: Secondary | ICD-10-CM | POA: Diagnosis not present

## 2017-06-29 DIAGNOSIS — E1122 Type 2 diabetes mellitus with diabetic chronic kidney disease: Secondary | ICD-10-CM | POA: Diagnosis not present

## 2017-06-29 DIAGNOSIS — I10 Essential (primary) hypertension: Secondary | ICD-10-CM | POA: Diagnosis not present

## 2017-06-29 DIAGNOSIS — Z9981 Dependence on supplemental oxygen: Secondary | ICD-10-CM | POA: Diagnosis not present

## 2017-06-29 DIAGNOSIS — E785 Hyperlipidemia, unspecified: Secondary | ICD-10-CM | POA: Diagnosis present

## 2017-06-29 DIAGNOSIS — Z833 Family history of diabetes mellitus: Secondary | ICD-10-CM | POA: Diagnosis not present

## 2017-06-29 DIAGNOSIS — T424X5A Adverse effect of benzodiazepines, initial encounter: Secondary | ICD-10-CM | POA: Diagnosis present

## 2017-06-29 DIAGNOSIS — I340 Nonrheumatic mitral (valve) insufficiency: Secondary | ICD-10-CM | POA: Diagnosis not present

## 2017-06-29 DIAGNOSIS — R2689 Other abnormalities of gait and mobility: Secondary | ICD-10-CM | POA: Diagnosis not present

## 2017-06-29 DIAGNOSIS — N179 Acute kidney failure, unspecified: Secondary | ICD-10-CM | POA: Diagnosis not present

## 2017-06-29 DIAGNOSIS — Z7401 Bed confinement status: Secondary | ICD-10-CM | POA: Diagnosis not present

## 2017-06-29 DIAGNOSIS — N3000 Acute cystitis without hematuria: Secondary | ICD-10-CM | POA: Diagnosis not present

## 2017-06-29 DIAGNOSIS — R531 Weakness: Secondary | ICD-10-CM | POA: Diagnosis not present

## 2017-06-29 DIAGNOSIS — K219 Gastro-esophageal reflux disease without esophagitis: Secondary | ICD-10-CM | POA: Diagnosis present

## 2017-06-29 DIAGNOSIS — R0600 Dyspnea, unspecified: Secondary | ICD-10-CM | POA: Diagnosis not present

## 2017-06-29 DIAGNOSIS — E162 Hypoglycemia, unspecified: Secondary | ICD-10-CM | POA: Diagnosis not present

## 2017-06-29 DIAGNOSIS — G40909 Epilepsy, unspecified, not intractable, without status epilepticus: Secondary | ICD-10-CM | POA: Diagnosis present

## 2017-06-29 DIAGNOSIS — E1121 Type 2 diabetes mellitus with diabetic nephropathy: Secondary | ICD-10-CM | POA: Diagnosis not present

## 2017-06-29 DIAGNOSIS — F322 Major depressive disorder, single episode, severe without psychotic features: Secondary | ICD-10-CM | POA: Diagnosis not present

## 2017-06-29 DIAGNOSIS — Z87891 Personal history of nicotine dependence: Secondary | ICD-10-CM | POA: Diagnosis not present

## 2017-06-29 DIAGNOSIS — I214 Non-ST elevation (NSTEMI) myocardial infarction: Secondary | ICD-10-CM | POA: Diagnosis not present

## 2017-06-29 DIAGNOSIS — N182 Chronic kidney disease, stage 2 (mild): Secondary | ICD-10-CM | POA: Diagnosis not present

## 2017-06-29 DIAGNOSIS — I350 Nonrheumatic aortic (valve) stenosis: Secondary | ICD-10-CM | POA: Diagnosis not present

## 2017-06-29 DIAGNOSIS — I5023 Acute on chronic systolic (congestive) heart failure: Secondary | ICD-10-CM | POA: Diagnosis not present

## 2017-06-29 DIAGNOSIS — N183 Chronic kidney disease, stage 3 (moderate): Secondary | ICD-10-CM | POA: Diagnosis not present

## 2017-06-29 DIAGNOSIS — E161 Other hypoglycemia: Secondary | ICD-10-CM | POA: Diagnosis not present

## 2017-06-29 DIAGNOSIS — G92 Toxic encephalopathy: Secondary | ICD-10-CM | POA: Diagnosis present

## 2017-06-29 DIAGNOSIS — E11649 Type 2 diabetes mellitus with hypoglycemia without coma: Secondary | ICD-10-CM | POA: Diagnosis present

## 2017-06-29 DIAGNOSIS — E119 Type 2 diabetes mellitus without complications: Secondary | ICD-10-CM | POA: Diagnosis not present

## 2017-06-29 DIAGNOSIS — R7989 Other specified abnormal findings of blood chemistry: Secondary | ICD-10-CM | POA: Diagnosis not present

## 2017-06-29 DIAGNOSIS — Z6841 Body Mass Index (BMI) 40.0 and over, adult: Secondary | ICD-10-CM | POA: Diagnosis not present

## 2017-06-29 DIAGNOSIS — R4182 Altered mental status, unspecified: Secondary | ICD-10-CM | POA: Diagnosis not present

## 2017-06-29 DIAGNOSIS — R269 Unspecified abnormalities of gait and mobility: Secondary | ICD-10-CM | POA: Diagnosis not present

## 2017-06-29 DIAGNOSIS — G9341 Metabolic encephalopathy: Secondary | ICD-10-CM | POA: Diagnosis not present

## 2017-06-29 DIAGNOSIS — R41841 Cognitive communication deficit: Secondary | ICD-10-CM | POA: Diagnosis not present

## 2017-06-29 DIAGNOSIS — R001 Bradycardia, unspecified: Secondary | ICD-10-CM | POA: Diagnosis not present

## 2017-06-29 DIAGNOSIS — I21A1 Myocardial infarction type 2: Secondary | ICD-10-CM | POA: Diagnosis not present

## 2017-06-29 DIAGNOSIS — M6282 Rhabdomyolysis: Secondary | ICD-10-CM | POA: Diagnosis present

## 2017-06-29 DIAGNOSIS — Z955 Presence of coronary angioplasty implant and graft: Secondary | ICD-10-CM | POA: Diagnosis not present

## 2017-06-29 DIAGNOSIS — Z8249 Family history of ischemic heart disease and other diseases of the circulatory system: Secondary | ICD-10-CM | POA: Diagnosis not present

## 2017-06-29 DIAGNOSIS — R278 Other lack of coordination: Secondary | ICD-10-CM | POA: Diagnosis not present

## 2017-06-30 DIAGNOSIS — I2510 Atherosclerotic heart disease of native coronary artery without angina pectoris: Secondary | ICD-10-CM | POA: Diagnosis not present

## 2017-06-30 DIAGNOSIS — I214 Non-ST elevation (NSTEMI) myocardial infarction: Secondary | ICD-10-CM | POA: Diagnosis not present

## 2017-06-30 DIAGNOSIS — I350 Nonrheumatic aortic (valve) stenosis: Secondary | ICD-10-CM | POA: Diagnosis not present

## 2017-06-30 DIAGNOSIS — I5023 Acute on chronic systolic (congestive) heart failure: Secondary | ICD-10-CM | POA: Diagnosis not present

## 2017-07-01 DIAGNOSIS — I214 Non-ST elevation (NSTEMI) myocardial infarction: Secondary | ICD-10-CM | POA: Diagnosis not present

## 2017-07-01 DIAGNOSIS — N183 Chronic kidney disease, stage 3 (moderate): Secondary | ICD-10-CM | POA: Diagnosis not present

## 2017-07-01 DIAGNOSIS — R269 Unspecified abnormalities of gait and mobility: Secondary | ICD-10-CM | POA: Diagnosis not present

## 2017-07-01 DIAGNOSIS — F419 Anxiety disorder, unspecified: Secondary | ICD-10-CM | POA: Diagnosis not present

## 2017-07-01 DIAGNOSIS — R531 Weakness: Secondary | ICD-10-CM | POA: Diagnosis not present

## 2017-07-01 DIAGNOSIS — F322 Major depressive disorder, single episode, severe without psychotic features: Secondary | ICD-10-CM | POA: Diagnosis not present

## 2017-07-01 DIAGNOSIS — R569 Unspecified convulsions: Secondary | ICD-10-CM | POA: Diagnosis not present

## 2017-07-01 DIAGNOSIS — I509 Heart failure, unspecified: Secondary | ICD-10-CM | POA: Diagnosis not present

## 2017-07-01 DIAGNOSIS — I10 Essential (primary) hypertension: Secondary | ICD-10-CM | POA: Diagnosis not present

## 2017-07-07 DIAGNOSIS — I350 Nonrheumatic aortic (valve) stenosis: Secondary | ICD-10-CM | POA: Diagnosis not present

## 2017-07-07 DIAGNOSIS — I5023 Acute on chronic systolic (congestive) heart failure: Secondary | ICD-10-CM | POA: Diagnosis not present

## 2017-07-07 DIAGNOSIS — E1121 Type 2 diabetes mellitus with diabetic nephropathy: Secondary | ICD-10-CM | POA: Diagnosis not present

## 2017-07-07 DIAGNOSIS — I2510 Atherosclerotic heart disease of native coronary artery without angina pectoris: Secondary | ICD-10-CM | POA: Diagnosis not present

## 2017-07-11 DIAGNOSIS — I10 Essential (primary) hypertension: Secondary | ICD-10-CM | POA: Diagnosis not present

## 2017-07-11 DIAGNOSIS — I5023 Acute on chronic systolic (congestive) heart failure: Secondary | ICD-10-CM | POA: Diagnosis not present

## 2017-07-11 DIAGNOSIS — I214 Non-ST elevation (NSTEMI) myocardial infarction: Secondary | ICD-10-CM | POA: Diagnosis not present

## 2017-07-11 DIAGNOSIS — E1121 Type 2 diabetes mellitus with diabetic nephropathy: Secondary | ICD-10-CM | POA: Diagnosis not present

## 2017-07-14 DIAGNOSIS — I5023 Acute on chronic systolic (congestive) heart failure: Secondary | ICD-10-CM | POA: Diagnosis not present

## 2017-07-14 DIAGNOSIS — E1140 Type 2 diabetes mellitus with diabetic neuropathy, unspecified: Secondary | ICD-10-CM | POA: Diagnosis not present

## 2017-07-14 DIAGNOSIS — I2510 Atherosclerotic heart disease of native coronary artery without angina pectoris: Secondary | ICD-10-CM | POA: Diagnosis not present

## 2017-07-14 DIAGNOSIS — N179 Acute kidney failure, unspecified: Secondary | ICD-10-CM | POA: Diagnosis not present

## 2017-07-15 DIAGNOSIS — I5023 Acute on chronic systolic (congestive) heart failure: Secondary | ICD-10-CM | POA: Diagnosis not present

## 2017-07-15 DIAGNOSIS — N179 Acute kidney failure, unspecified: Secondary | ICD-10-CM | POA: Diagnosis not present

## 2017-07-15 DIAGNOSIS — N3000 Acute cystitis without hematuria: Secondary | ICD-10-CM | POA: Diagnosis not present

## 2017-07-15 DIAGNOSIS — I2510 Atherosclerotic heart disease of native coronary artery without angina pectoris: Secondary | ICD-10-CM | POA: Diagnosis not present

## 2017-07-19 DIAGNOSIS — N3000 Acute cystitis without hematuria: Secondary | ICD-10-CM | POA: Diagnosis not present

## 2017-07-19 DIAGNOSIS — E1121 Type 2 diabetes mellitus with diabetic nephropathy: Secondary | ICD-10-CM | POA: Diagnosis not present

## 2017-07-19 DIAGNOSIS — I2510 Atherosclerotic heart disease of native coronary artery without angina pectoris: Secondary | ICD-10-CM | POA: Diagnosis not present

## 2017-07-19 DIAGNOSIS — R040 Epistaxis: Secondary | ICD-10-CM | POA: Diagnosis not present

## 2017-07-21 ENCOUNTER — Inpatient Hospital Stay (HOSPITAL_COMMUNITY)
Admission: EM | Admit: 2017-07-21 | Discharge: 2017-07-27 | DRG: 682 | Disposition: A | Discharge status: Skilled Nursing Facility | Payer: Medicare Other | Source: Skilled Nursing Facility | Attending: Internal Medicine | Admitting: Internal Medicine

## 2017-07-21 ENCOUNTER — Encounter (HOSPITAL_COMMUNITY): Payer: Self-pay | Admitting: *Deleted

## 2017-07-21 ENCOUNTER — Emergency Department (HOSPITAL_COMMUNITY): Payer: Medicare Other

## 2017-07-21 ENCOUNTER — Other Ambulatory Visit: Payer: Self-pay

## 2017-07-21 DIAGNOSIS — Z6841 Body Mass Index (BMI) 40.0 and over, adult: Secondary | ICD-10-CM

## 2017-07-21 DIAGNOSIS — T40605A Adverse effect of unspecified narcotics, initial encounter: Secondary | ICD-10-CM | POA: Diagnosis present

## 2017-07-21 DIAGNOSIS — Z8249 Family history of ischemic heart disease and other diseases of the circulatory system: Secondary | ICD-10-CM

## 2017-07-21 DIAGNOSIS — I340 Nonrheumatic mitral (valve) insufficiency: Secondary | ICD-10-CM | POA: Diagnosis not present

## 2017-07-21 DIAGNOSIS — R569 Unspecified convulsions: Secondary | ICD-10-CM

## 2017-07-21 DIAGNOSIS — Z90710 Acquired absence of both cervix and uterus: Secondary | ICD-10-CM

## 2017-07-21 DIAGNOSIS — R0600 Dyspnea, unspecified: Secondary | ICD-10-CM | POA: Diagnosis not present

## 2017-07-21 DIAGNOSIS — I10 Essential (primary) hypertension: Secondary | ICD-10-CM | POA: Diagnosis not present

## 2017-07-21 DIAGNOSIS — Z833 Family history of diabetes mellitus: Secondary | ICD-10-CM | POA: Diagnosis not present

## 2017-07-21 DIAGNOSIS — E161 Other hypoglycemia: Secondary | ICD-10-CM | POA: Diagnosis not present

## 2017-07-21 DIAGNOSIS — E039 Hypothyroidism, unspecified: Secondary | ICD-10-CM | POA: Diagnosis present

## 2017-07-21 DIAGNOSIS — E1159 Type 2 diabetes mellitus with other circulatory complications: Secondary | ICD-10-CM | POA: Diagnosis present

## 2017-07-21 DIAGNOSIS — Z87891 Personal history of nicotine dependence: Secondary | ICD-10-CM | POA: Diagnosis not present

## 2017-07-21 DIAGNOSIS — I25118 Atherosclerotic heart disease of native coronary artery with other forms of angina pectoris: Secondary | ICD-10-CM

## 2017-07-21 DIAGNOSIS — D649 Anemia, unspecified: Secondary | ICD-10-CM | POA: Diagnosis not present

## 2017-07-21 DIAGNOSIS — E1029 Type 1 diabetes mellitus with other diabetic kidney complication: Secondary | ICD-10-CM

## 2017-07-21 DIAGNOSIS — N178 Other acute kidney failure: Secondary | ICD-10-CM | POA: Diagnosis not present

## 2017-07-21 DIAGNOSIS — I639 Cerebral infarction, unspecified: Secondary | ICD-10-CM | POA: Diagnosis not present

## 2017-07-21 DIAGNOSIS — E875 Hyperkalemia: Secondary | ICD-10-CM

## 2017-07-21 DIAGNOSIS — E785 Hyperlipidemia, unspecified: Secondary | ICD-10-CM | POA: Diagnosis present

## 2017-07-21 DIAGNOSIS — J9611 Chronic respiratory failure with hypoxia: Secondary | ICD-10-CM | POA: Diagnosis present

## 2017-07-21 DIAGNOSIS — G40909 Epilepsy, unspecified, not intractable, without status epilepticus: Secondary | ICD-10-CM | POA: Diagnosis present

## 2017-07-21 DIAGNOSIS — N179 Acute kidney failure, unspecified: Secondary | ICD-10-CM | POA: Diagnosis not present

## 2017-07-21 DIAGNOSIS — G92 Toxic encephalopathy: Secondary | ICD-10-CM | POA: Diagnosis present

## 2017-07-21 DIAGNOSIS — Z8673 Personal history of transient ischemic attack (TIA), and cerebral infarction without residual deficits: Secondary | ICD-10-CM

## 2017-07-21 DIAGNOSIS — G9341 Metabolic encephalopathy: Secondary | ICD-10-CM | POA: Diagnosis present

## 2017-07-21 DIAGNOSIS — R69 Illness, unspecified: Secondary | ICD-10-CM

## 2017-07-21 DIAGNOSIS — M6281 Muscle weakness (generalized): Secondary | ICD-10-CM | POA: Diagnosis not present

## 2017-07-21 DIAGNOSIS — N170 Acute kidney failure with tubular necrosis: Secondary | ICD-10-CM | POA: Diagnosis not present

## 2017-07-21 DIAGNOSIS — R278 Other lack of coordination: Secondary | ICD-10-CM | POA: Diagnosis not present

## 2017-07-21 DIAGNOSIS — F330 Major depressive disorder, recurrent, mild: Secondary | ICD-10-CM | POA: Diagnosis not present

## 2017-07-21 DIAGNOSIS — T502X5A Adverse effect of carbonic-anhydrase inhibitors, benzothiadiazides and other diuretics, initial encounter: Secondary | ICD-10-CM | POA: Diagnosis present

## 2017-07-21 DIAGNOSIS — I2510 Atherosclerotic heart disease of native coronary artery without angina pectoris: Secondary | ICD-10-CM | POA: Diagnosis not present

## 2017-07-21 DIAGNOSIS — M6282 Rhabdomyolysis: Secondary | ICD-10-CM | POA: Diagnosis present

## 2017-07-21 DIAGNOSIS — E1169 Type 2 diabetes mellitus with other specified complication: Secondary | ICD-10-CM | POA: Diagnosis present

## 2017-07-21 DIAGNOSIS — F329 Major depressive disorder, single episode, unspecified: Secondary | ICD-10-CM | POA: Diagnosis present

## 2017-07-21 DIAGNOSIS — Z7982 Long term (current) use of aspirin: Secondary | ICD-10-CM

## 2017-07-21 DIAGNOSIS — I959 Hypotension, unspecified: Secondary | ICD-10-CM | POA: Diagnosis not present

## 2017-07-21 DIAGNOSIS — F419 Anxiety disorder, unspecified: Secondary | ICD-10-CM | POA: Diagnosis present

## 2017-07-21 DIAGNOSIS — R001 Bradycardia, unspecified: Secondary | ICD-10-CM | POA: Diagnosis not present

## 2017-07-21 DIAGNOSIS — I5033 Acute on chronic diastolic (congestive) heart failure: Secondary | ICD-10-CM | POA: Diagnosis present

## 2017-07-21 DIAGNOSIS — I509 Heart failure, unspecified: Secondary | ICD-10-CM | POA: Diagnosis not present

## 2017-07-21 DIAGNOSIS — T424X5A Adverse effect of benzodiazepines, initial encounter: Secondary | ICD-10-CM | POA: Diagnosis present

## 2017-07-21 DIAGNOSIS — K219 Gastro-esophageal reflux disease without esophagitis: Secondary | ICD-10-CM | POA: Diagnosis present

## 2017-07-21 DIAGNOSIS — Z794 Long term (current) use of insulin: Secondary | ICD-10-CM

## 2017-07-21 DIAGNOSIS — R41841 Cognitive communication deficit: Secondary | ICD-10-CM | POA: Diagnosis not present

## 2017-07-21 DIAGNOSIS — D638 Anemia in other chronic diseases classified elsewhere: Secondary | ICD-10-CM | POA: Diagnosis present

## 2017-07-21 DIAGNOSIS — Z9981 Dependence on supplemental oxygen: Secondary | ICD-10-CM

## 2017-07-21 DIAGNOSIS — R2689 Other abnormalities of gait and mobility: Secondary | ICD-10-CM | POA: Diagnosis not present

## 2017-07-21 DIAGNOSIS — Z955 Presence of coronary angioplasty implant and graft: Secondary | ICD-10-CM

## 2017-07-21 DIAGNOSIS — I110 Hypertensive heart disease with heart failure: Secondary | ICD-10-CM | POA: Diagnosis present

## 2017-07-21 DIAGNOSIS — D509 Iron deficiency anemia, unspecified: Secondary | ICD-10-CM | POA: Diagnosis present

## 2017-07-21 DIAGNOSIS — E162 Hypoglycemia, unspecified: Secondary | ICD-10-CM | POA: Diagnosis not present

## 2017-07-21 DIAGNOSIS — R4020 Unspecified coma: Secondary | ICD-10-CM | POA: Diagnosis not present

## 2017-07-21 DIAGNOSIS — R0602 Shortness of breath: Secondary | ICD-10-CM | POA: Diagnosis not present

## 2017-07-21 DIAGNOSIS — E860 Dehydration: Secondary | ICD-10-CM | POA: Diagnosis present

## 2017-07-21 DIAGNOSIS — R4182 Altered mental status, unspecified: Secondary | ICD-10-CM | POA: Diagnosis not present

## 2017-07-21 DIAGNOSIS — I350 Nonrheumatic aortic (valve) stenosis: Secondary | ICD-10-CM | POA: Diagnosis not present

## 2017-07-21 DIAGNOSIS — Z7401 Bed confinement status: Secondary | ICD-10-CM | POA: Diagnosis not present

## 2017-07-21 DIAGNOSIS — I6330 Cerebral infarction due to thrombosis of unspecified cerebral artery: Secondary | ICD-10-CM

## 2017-07-21 DIAGNOSIS — E11649 Type 2 diabetes mellitus with hypoglycemia without coma: Secondary | ICD-10-CM | POA: Diagnosis present

## 2017-07-21 DIAGNOSIS — I5023 Acute on chronic systolic (congestive) heart failure: Secondary | ICD-10-CM | POA: Diagnosis not present

## 2017-07-21 DIAGNOSIS — E119 Type 2 diabetes mellitus without complications: Secondary | ICD-10-CM | POA: Diagnosis not present

## 2017-07-21 DIAGNOSIS — I152 Hypertension secondary to endocrine disorders: Secondary | ICD-10-CM | POA: Diagnosis present

## 2017-07-21 LAB — CBC WITH DIFFERENTIAL/PLATELET
Abs Immature Granulocytes: 0 10*3/uL (ref 0.0–0.1)
Basophils Absolute: 0 10*3/uL (ref 0.0–0.1)
Basophils Relative: 1 %
EOS PCT: 2 %
Eosinophils Absolute: 0.1 10*3/uL (ref 0.0–0.7)
HCT: 26.9 % — ABNORMAL LOW (ref 36.0–46.0)
HEMOGLOBIN: 8 g/dL — AB (ref 12.0–15.0)
Immature Granulocytes: 0 %
LYMPHS ABS: 1.1 10*3/uL (ref 0.7–4.0)
LYMPHS PCT: 18 %
MCH: 29.1 pg (ref 26.0–34.0)
MCHC: 29.7 g/dL — ABNORMAL LOW (ref 30.0–36.0)
MCV: 97.8 fL (ref 78.0–100.0)
MONO ABS: 0.5 10*3/uL (ref 0.1–1.0)
Monocytes Relative: 8 %
Neutro Abs: 4.3 10*3/uL (ref 1.7–7.7)
Neutrophils Relative %: 71 %
Platelets: 179 10*3/uL (ref 150–400)
RBC: 2.75 MIL/uL — ABNORMAL LOW (ref 3.87–5.11)
RDW: 15.4 % (ref 11.5–15.5)
WBC: 6 10*3/uL (ref 4.0–10.5)

## 2017-07-21 LAB — I-STAT CHEM 8, ED
BUN: 56 mg/dL — ABNORMAL HIGH (ref 8–23)
Calcium, Ion: 1.09 mmol/L — ABNORMAL LOW (ref 1.15–1.40)
Chloride: 102 mmol/L (ref 98–111)
Creatinine, Ser: 4.7 mg/dL — ABNORMAL HIGH (ref 0.44–1.00)
GLUCOSE: 74 mg/dL (ref 70–99)
HCT: 26 % — ABNORMAL LOW (ref 36.0–46.0)
HEMOGLOBIN: 8.8 g/dL — AB (ref 12.0–15.0)
POTASSIUM: 6.7 mmol/L — AB (ref 3.5–5.1)
SODIUM: 134 mmol/L — AB (ref 135–145)
TCO2: 26 mmol/L (ref 22–32)

## 2017-07-21 LAB — BASIC METABOLIC PANEL
Anion gap: 10 (ref 5–15)
BUN: 56 mg/dL — ABNORMAL HIGH (ref 8–23)
CO2: 26 mmol/L (ref 22–32)
CREATININE: 4.67 mg/dL — AB (ref 0.44–1.00)
Calcium: 8.7 mg/dL — ABNORMAL LOW (ref 8.9–10.3)
Chloride: 102 mmol/L (ref 98–111)
GFR calc non Af Amer: 9 mL/min — ABNORMAL LOW (ref >60)
GFR, EST AFRICAN AMERICAN: 10 mL/min — AB (ref >60)
Glucose, Bld: 49 mg/dL — ABNORMAL LOW (ref 70–99)
Potassium: 5.5 mmol/L — ABNORMAL HIGH (ref 3.5–5.1)
Sodium: 138 mmol/L (ref 135–145)

## 2017-07-21 LAB — COMPREHENSIVE METABOLIC PANEL
ALBUMIN: 3.2 g/dL — AB (ref 3.5–5.0)
ALK PHOS: 80 U/L (ref 38–126)
ALT: 26 U/L (ref 0–44)
AST: 65 U/L — ABNORMAL HIGH (ref 15–41)
Anion gap: 9 (ref 5–15)
BUN: 57 mg/dL — ABNORMAL HIGH (ref 8–23)
CO2: 26 mmol/L (ref 22–32)
CREATININE: 4.7 mg/dL — AB (ref 0.44–1.00)
Calcium: 8.7 mg/dL — ABNORMAL LOW (ref 8.9–10.3)
Chloride: 101 mmol/L (ref 98–111)
GFR calc non Af Amer: 8 mL/min — ABNORMAL LOW (ref >60)
GFR, EST AFRICAN AMERICAN: 10 mL/min — AB (ref >60)
GLUCOSE: 78 mg/dL (ref 70–99)
Potassium: 6.8 mmol/L (ref 3.5–5.1)
SODIUM: 136 mmol/L (ref 135–145)
Total Bilirubin: 0.4 mg/dL (ref 0.3–1.2)
Total Protein: 6.5 g/dL (ref 6.5–8.1)

## 2017-07-21 LAB — I-STAT TROPONIN, ED: Troponin i, poc: 0.23 ng/mL (ref 0.00–0.08)

## 2017-07-21 LAB — PROTIME-INR
INR: 1.24
PROTHROMBIN TIME: 15.5 s — AB (ref 11.4–15.2)

## 2017-07-21 LAB — BRAIN NATRIURETIC PEPTIDE: B Natriuretic Peptide: 2532 pg/mL — ABNORMAL HIGH (ref 0.0–100.0)

## 2017-07-21 LAB — MAGNESIUM: Magnesium: 2.1 mg/dL (ref 1.7–2.4)

## 2017-07-21 LAB — LIPASE, BLOOD: Lipase: 25 U/L (ref 11–51)

## 2017-07-21 LAB — MRSA PCR SCREENING: MRSA BY PCR: POSITIVE — AB

## 2017-07-21 LAB — CBG MONITORING, ED
GLUCOSE-CAPILLARY: 100 mg/dL — AB (ref 70–99)
Glucose-Capillary: 39 mg/dL — CL (ref 70–99)

## 2017-07-21 LAB — PHOSPHORUS: Phosphorus: 6.8 mg/dL — ABNORMAL HIGH (ref 2.5–4.6)

## 2017-07-21 LAB — I-STAT CG4 LACTIC ACID, ED: LACTIC ACID, VENOUS: 0.66 mmol/L (ref 0.5–1.9)

## 2017-07-21 MED ORDER — ENOXAPARIN SODIUM 40 MG/0.4ML ~~LOC~~ SOLN
40.0000 mg | SUBCUTANEOUS | Status: DC
  Administered 2017-07-21 – 2017-07-26 (×5): 40 mg via SUBCUTANEOUS
  Filled 2017-07-21 (×6): qty 0.4

## 2017-07-21 MED ORDER — SODIUM CHLORIDE 0.9 % IV SOLN
INTRAVENOUS | Status: DC
  Administered 2017-07-21 – 2017-07-24 (×6): via INTRAVENOUS

## 2017-07-21 MED ORDER — CHLORHEXIDINE GLUCONATE CLOTH 2 % EX PADS
6.0000 | MEDICATED_PAD | Freq: Every day | CUTANEOUS | Status: AC
  Administered 2017-07-22 – 2017-07-26 (×4): 6 via TOPICAL

## 2017-07-21 MED ORDER — LEVETIRACETAM 500 MG PO TABS
1000.0000 mg | ORAL_TABLET | Freq: Two times a day (BID) | ORAL | Status: DC
  Administered 2017-07-21 – 2017-07-27 (×12): 1000 mg via ORAL
  Filled 2017-07-21 (×13): qty 2

## 2017-07-21 MED ORDER — METOPROLOL TARTRATE 25 MG PO TABS: 50.0000 mg | ORAL_TABLET | Freq: Every day | ORAL | Status: DC

## 2017-07-21 MED ORDER — CALCIUM GLUCONATE 10 % IV SOLN
1.0000 g | Freq: Once | INTRAVENOUS | Status: AC
  Administered 2017-07-21: 1 g via INTRAVENOUS
  Filled 2017-07-21: qty 10

## 2017-07-21 MED ORDER — SODIUM BICARBONATE 8.4 % IV SOLN
50.0000 meq | Freq: Once | INTRAVENOUS | Status: AC
  Administered 2017-07-21: 50 meq via INTRAVENOUS
  Filled 2017-07-21: qty 50

## 2017-07-21 MED ORDER — PRASUGREL HCL 10 MG PO TABS
10.0000 mg | ORAL_TABLET | Freq: Every day | ORAL | Status: DC
  Administered 2017-07-22 – 2017-07-27 (×6): 10 mg via ORAL
  Filled 2017-07-21 (×6): qty 1

## 2017-07-21 MED ORDER — METOPROLOL TARTRATE 25 MG PO TABS
25.0000 mg | ORAL_TABLET | Freq: Every day | ORAL | Status: DC
  Administered 2017-07-22 – 2017-07-27 (×6): 25 mg via ORAL
  Filled 2017-07-21 (×6): qty 1

## 2017-07-21 MED ORDER — POLYETHYLENE GLYCOL 3350 17 G PO PACK: 17.0000 g | PACK | Freq: Every day | ORAL | Status: DC | PRN

## 2017-07-21 MED ORDER — DEXTROSE 50 % IV SOLN
INTRAVENOUS | Status: AC
  Filled 2017-07-21: qty 50

## 2017-07-21 MED ORDER — ONDANSETRON HCL 4 MG PO TABS
4.0000 mg | ORAL_TABLET | Freq: Four times a day (QID) | ORAL | Status: DC | PRN
Start: 2017-07-21 — End: 2017-07-27

## 2017-07-21 MED ORDER — ROSUVASTATIN CALCIUM 10 MG PO TABS
10.0000 mg | ORAL_TABLET | Freq: Every day | ORAL | Status: DC
  Administered 2017-07-21: 10 mg via ORAL
  Filled 2017-07-21: qty 1

## 2017-07-21 MED ORDER — DEXTROSE 50 % IV SOLN
1.0000 | Freq: Once | INTRAVENOUS | Status: AC
  Administered 2017-07-21: 50 mL via INTRAVENOUS

## 2017-07-21 MED ORDER — MUPIROCIN 2 % EX OINT
1.0000 "application " | TOPICAL_OINTMENT | Freq: Two times a day (BID) | CUTANEOUS | Status: AC
  Administered 2017-07-22 – 2017-07-26 (×8): 1 via NASAL
  Filled 2017-07-21 (×4): qty 22

## 2017-07-21 MED ORDER — INSULIN ASPART 100 UNIT/ML ~~LOC~~ SOLN: 0.0000 [IU] | Freq: Three times a day (TID) | SUBCUTANEOUS | Status: DC

## 2017-07-21 MED ORDER — ORAL CARE MOUTH RINSE
15.0000 mL | Freq: Two times a day (BID) | OROMUCOSAL | Status: DC
  Administered 2017-07-23 – 2017-07-27 (×7): 15 mL via OROMUCOSAL

## 2017-07-21 MED ORDER — SODIUM POLYSTYRENE SULFONATE 15 GM/60ML PO SUSP
30.0000 g | ORAL | Status: AC
  Administered 2017-07-21 – 2017-07-22 (×3): 30 g via ORAL
  Filled 2017-07-21 (×3): qty 120

## 2017-07-21 MED ORDER — SODIUM CHLORIDE 0.9 % IV SOLN
Freq: Once | INTRAVENOUS | Status: AC
  Administered 2017-07-21: 17:00:00 via INTRAVENOUS

## 2017-07-21 MED ORDER — ASPIRIN EC 325 MG PO TBEC
325.0000 mg | DELAYED_RELEASE_TABLET | Freq: Every day | ORAL | Status: DC
  Administered 2017-07-22 – 2017-07-27 (×6): 325 mg via ORAL
  Filled 2017-07-21 (×6): qty 1

## 2017-07-21 MED ORDER — INSULIN ASPART 100 UNIT/ML IV SOLN
5.0000 [IU] | Freq: Once | INTRAVENOUS | Status: AC
  Administered 2017-07-21: 5 [IU] via INTRAVENOUS
  Filled 2017-07-21: qty 0.05

## 2017-07-21 MED ORDER — ISOSORBIDE MONONITRATE ER 30 MG PO TB24: 60.0000 mg | ORAL_TABLET | Freq: Every day | ORAL | Status: DC

## 2017-07-21 MED ORDER — INSULIN ASPART 100 UNIT/ML ~~LOC~~ SOLN
SUBCUTANEOUS | Status: AC
  Filled 2017-07-21: qty 1

## 2017-07-21 MED ORDER — OXYMETAZOLINE HCL 0.05 % NA SOLN
1.0000 | Freq: Two times a day (BID) | NASAL | Status: DC | PRN
  Filled 2017-07-21: qty 15

## 2017-07-21 MED ORDER — ACETAMINOPHEN 325 MG PO TABS: 650.0000 mg | ORAL_TABLET | ORAL | Status: DC | PRN

## 2017-07-21 MED ORDER — ISOSORBIDE MONONITRATE ER 30 MG PO TB24
30.0000 mg | ORAL_TABLET | Freq: Every day | ORAL | Status: DC
  Administered 2017-07-22 – 2017-07-27 (×6): 30 mg via ORAL
  Filled 2017-07-21 (×6): qty 1

## 2017-07-21 MED ORDER — ALBUTEROL SULFATE (2.5 MG/3ML) 0.083% IN NEBU: 10.0000 mg | INHALATION_SOLUTION | Freq: Once | RESPIRATORY_TRACT | Status: DC

## 2017-07-21 MED ORDER — SODIUM CHLORIDE 0.9 % IV BOLUS
250.0000 mL | Freq: Once | INTRAVENOUS | Status: AC
  Administered 2017-07-21: 250 mL via INTRAVENOUS

## 2017-07-21 MED ORDER — ALBUTEROL (5 MG/ML) CONTINUOUS INHALATION SOLN
10.0000 mg/h | INHALATION_SOLUTION | RESPIRATORY_TRACT | Status: DC
  Administered 2017-07-21: 10 mg/h via RESPIRATORY_TRACT
  Filled 2017-07-21: qty 20

## 2017-07-21 MED ORDER — ALBUTEROL SULFATE (2.5 MG/3ML) 0.083% IN NEBU: 2.5000 mg | INHALATION_SOLUTION | RESPIRATORY_TRACT | Status: DC | PRN

## 2017-07-21 MED ORDER — DEXTROSE 50 % IV SOLN
1.0000 | Freq: Once | INTRAVENOUS | Status: AC
  Administered 2017-07-21: 50 mL via INTRAVENOUS
  Filled 2017-07-21: qty 50

## 2017-07-21 MED ORDER — TRAMADOL HCL 50 MG PO TABS: 50.0000 mg | ORAL_TABLET | Freq: Four times a day (QID) | ORAL | Status: DC | PRN

## 2017-07-21 NOTE — ED Triage Notes (Signed)
Patient presents to ed via [facility] from [facility] states patient has labs drawn this am and was called back with Crea. 44, BUN 56.2, K 7 Patient was has recently completed antibiotic therapy for UTI. Patient is confused however per ems this is patients norm.

## 2017-07-21 NOTE — ED Provider Notes (Signed)
[facility] EMERGENCY DEPARTMENT Provider Note   CSN: [id number] Arrival date & time: 07/21/17  1426     History   Chief Complaint Chief Complaint  Patient presents with  . Abnormal Lab    HPI [name] is a 72 y.o. female.  HPI Patient's daughter reports that the patient has been at [facility] in rehabilitation.  Reports she is had a steady decline since her discharge from the hospital.  Gradual increasing general confusion.  She had labs drawn (her daughter is not sure if it was from today) and was told that she had a high potassium at 7 and renal failure.  Patient was referred to the emergency department.  Patient denies acute complaints at this time.  Denies chest pain or shortness of breath.  Denies abdominal pain.  No recent vomiting.  Patient's daughter reports at baseline she has very limited activity level.  She has morbid obesity. Past Medical History:  Diagnosis Date  . Anxiety   . Coronary artery disease   . Depression   . Diabetes mellitus    insulin dependent  . Edema of lower extremity   . Esophageal stricture 2008   EGD  . Fatigue   . GERD (gastroesophageal reflux disease)   . Heart murmur   . Hiatal hernia 2008   EGD  . Hyperlipidemia   . Hypertension   . Knee pain   . Morbid obesity ([place])   . Nonproductive cough    chronic  . Orthopnea   . Seizure disorder ([place])   . SOB (shortness of breath)   . Syncope and collapse   . Tremor     Patient Active Problem List   Diagnosis Date Noted  . Pyelonephritis 01/24/2017  . LV dysfunction 10/06/2016  . 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 Acute on chronic diastolic CHF (congestive heart failure) ([place]) 09/09/2016  . Elevated troponin 09/09/2016  . Migraine 08/20/2016  . Morbid obesity ([place]) 08/20/2016  . Anxiety 10/02/2015  . DOE (dyspnea on exertion) 10/02/2015  . Dyslipidemia 10/02/2015  . Nonrheumatic aortic valve  stenosis 10/02/2015  . Old MI (myocardial infarction) 10/02/2015  . Insomnia 11/03/2014  . Weakness generalized 08/28/2014  . Seizure disorder ([place]) 08/28/2014  . Diabetes mellitus without complication ([place]) 16/60/6301  . Coronary artery disease of native artery of native heart with stable angina pectoris ([place]) 06/02/2010  . Depression 05/20/2007  . Essential hypertension 05/20/2007  . Diaphragmatic hernia 05/20/2007  . Diverticulosis of colon 05/20/2007  . IBS 05/20/2007  . Fatty liver 05/20/2007  . Hypothyroidism 05/20/2007  . Hx of transient ischemic attack (TIA) 05/20/2007    Past Surgical History:  Procedure Laterality Date  . ABDOMINAL HYSTERECTOMY    . CARDIAC CATHETERIZATION  03/28/2009  . KNEE ARTHROSCOPY    . TRANSTHORACIC ECHOCARDIOGRAM     showed ef of 65% with no regional wall motion abnormalities     OB History   None      Home Medications    Prior to Admission medications   Medication Sig Start Date End Date Taking? Authorizing Provider  ALPRAZolam Duanne Moron) 0.5 MG tablet Take 1 tablet (0.5 mg total) by mouth 2 (two) times daily. 01/21/17  Yes Briscoe Deutscher, DO  aspirin EC 325 MG EC tablet Take 1 tablet (325 mg total) by mouth daily. 09/02/14  Yes Rai, Ripudeep K, MD  furosemide (LASIX) 20 MG tablet Take 20 mg by mouth daily.   Yes [provider]  gabapentin (NEURONTIN) 300 MG capsule Take 300 mg by mouth daily.   Yes [provider]  Insulin Detemir (LEVEMIR FLEXTOUCH) 100 UNIT/ML Pen Inject 20 units into the skin every morning. 03/25/17  Yes Renato Shin, MD  isosorbide mononitrate (IMDUR) 60 MG 24 hr tablet Take 60 mg by mouth daily.    Yes [provider]  metoprolol (LOPRESSOR) 50 MG tablet Take 50 mg by mouth daily.  07/02/14  Yes [provider]  ondansetron (ZOFRAN) 4 MG tablet Take 4 mg by mouth every 6 (six) hours as needed for nausea or vomiting.   Yes [provider]  oxymetazoline (AFRIN) 0.05 % nasal  spray Place 1 spray into both nostrils 2 (two) times daily as needed (nosebleeds).   Yes [provider]  potassium chloride SA (K-DUR,KLOR-CON) 20 MEQ tablet Take 20 mEq by mouth daily.   Yes [provider]  prasugrel (EFFIENT) 10 MG TABS tablet Take 10 mg by mouth daily.   Yes [provider]  rizatriptan (MAXALT) 10 MG tablet Take 1 tablet (10 mg total) by mouth as needed for migraine. May repeat in 2 hours if needed Patient taking differently: Take 10 mg by mouth daily as needed (for chronic pain).  09/22/16  Yes Briscoe Deutscher, DO  rosuvastatin (CRESTOR) 10 MG tablet Take 1 tablet (10 mg total) by mouth daily. Patient taking differently: Take 10 mg by mouth every evening.  01/21/17  Yes Briscoe Deutscher, DO  sertraline (ZOLOFT) 100 MG tablet Take 1 tablet (100 mg total) by mouth every morning. 01/21/17  Yes Briscoe Deutscher, DO  sodium chloride (OCEAN) 0.65 % SOLN nasal spray Place 2 sprays into both nostrils 3 (three) times daily.   Yes [provider]  sulfamethoxazole-trimethoprim (BACTRIM DS,SEPTRA DS) 800-160 MG tablet Take 1 tablet by mouth 2 (two) times daily. 07/15/17 07/22/17 Yes [provider]  traMADol (ULTRAM) 50 MG tablet Take 50 mg by mouth every 6 (six) hours as needed for moderate pain or severe pain.   Yes [provider]  valsartan (DIOVAN) 160 MG tablet Take 160 mg by mouth daily.   Yes [provider]  acetaminophen (TYLENOL) 325 MG tablet Take 2 tablets (650 mg total) by mouth every 4 (four) hours as needed for headache or mild pain. Patient not taking: Reported on 07/21/2017 09/12/16   Robbie Lis, MD  glucose blood [facility] VERIO) test strip 1 each by Other route daily. And lancets 1/day. Dx code E11.9. Patient not taking: Reported on 07/21/2017 03/24/17   Renato Shin, MD  HYDROcodone-acetaminophen (NORCO/VICODIN) 5-325 MG tablet Take 1 tablet by mouth 3 (three) times daily as needed for moderate pain. Patient not  taking: Reported on 07/21/2017 01/31/17   Briscoe Deutscher, DO  hydrOXYzine (ATARAX/VISTARIL) 10 MG tablet Take 1 tablet (10 mg total) by mouth 2 (two) times daily as needed for itching. Patient not taking: Reported on 07/21/2017 09/22/16   Briscoe Deutscher, DO  levETIRAcetam (KEPPRA) 1000 MG tablet Take 1 tablet (1,000 mg total) by mouth 2 (two) times daily. Patient not taking: Reported on 07/21/2017 08/30/16   Briscoe Deutscher, DO  methocarbamol (ROBAXIN) 500 MG tablet Take 1 tablet (500 mg total) by mouth 3 (three) times daily. Patient not taking: Reported on 07/21/2017 01/27/17   Geradine Girt, DO  polyethylene glycol (MIRALAX / GLYCOLAX) packet Take 17 g by mouth daily as needed for mild constipation. Patient not taking: Reported on 07/21/2017 01/27/17   Geradine Girt, DO  Family History Family History  Problem Relation Age of Onset  . Heart attack Mother   . Hypertension Mother   . COPD Father   . Heart disease Sister   . Diabetes Brother   . Diabetes Brother   . Prostate cancer Brother   . Kidney disease Brother     Social History Social History   Tobacco Use  . Smoking status: Former Smoker    Last attempt to quit: 06/02/1970    Years since quitting: 47.1  . Smokeless tobacco: Never Used  Substance Use Topics  . Alcohol use: No  . Drug use: No     Allergies   Codeine   Review of Systems Review of Systems 10 Systems reviewed and are negative for acute change except as noted in the HPI.   Physical Exam Updated Vital Signs BP 122/63   Pulse 96   Temp 98.8 F (37.1 C) (Oral)   Resp (!) 21   SpO2 100%   Physical Exam  Constitutional:  She is alert but generally pale appearance.  HENT:  Head: Normocephalic and atraumatic.  Mouth/Throat: Oropharynx is clear and moist.  Eyes: EOM are normal.  Cardiovascular:  bradycardia.  No gross rub murmur gallop.  Pulmonary/Chest:  No significant increase of work of breathing at rest.  Is grossly clear but poor inspiratory  effort.  Abdominal:  Is morbidly obese.  Soft and nontender.  No rashes or Candida in skin folds.  Musculoskeletal:  Patient has approximate 1+ edema bilateral lower extremities.  Calves are nontender.  Patient has multiple bruises but no wounds or cellulitic changes.  Neurological: She is alert.  Patient is mildly confused.  Daughter reports this has become a new baseline over several weeks to months.  Patient answers questions and interacts with sometimes her conversation is not situationally appropriate.  Has basic commands.  No focal motor deficit.  Skin: Skin is warm and dry. There is pallor.  Psychiatric: She has a normal mood and affect.  Patient is calm and interactive.     ED Treatments / Results  Labs (all labs ordered are listed, but only abnormal results are displayed) Labs Reviewed  COMPREHENSIVE METABOLIC PANEL - Abnormal; Notable for the following components:      Result Value   Potassium 6.8 (*)    BUN 57 (*)    Creatinine, Ser 4.70 (*)    Calcium 8.7 (*)    Albumin 3.2 (*)    AST 65 (*)    GFR calc non Af Amer 8 (*)    GFR calc Af Amer 10 (*)    All other components within normal limits  BRAIN NATRIURETIC PEPTIDE - Abnormal; Notable for the following components:   B Natriuretic Peptide 2,532.0 (*)    All other components within normal limits  CBC WITH DIFFERENTIAL/PLATELET - Abnormal; Notable for the following components:   RBC 2.75 (*)    Hemoglobin 8.0 (*)    HCT 26.9 (*)    MCHC 29.7 (*)    All other components within normal limits  PROTIME-INR - Abnormal; Notable for the following components:   Prothrombin Time 15.5 (*)    All other components within normal limits  PHOSPHORUS - Abnormal; Notable for the following components:   Phosphorus 6.8 (*)    All other components within normal limits  I-STAT CHEM 8, ED - Abnormal; Notable for the following components:   Sodium 134 (*)    Potassium 6.7 (*)    BUN 56 (*)  Creatinine, Ser 4.70 (*)    Calcium,  Ion 1.09 (*)    Hemoglobin 8.8 (*)    HCT 26.0 (*)    All other components within normal limits  I-STAT TROPONIN, ED - Abnormal; Notable for the following components:   Troponin i, poc 0.23 (*)    All other components within normal limits  URINE CULTURE  LIPASE, BLOOD  MAGNESIUM  URINALYSIS, ROUTINE W REFLEX MICROSCOPIC  I-STAT CG4 LACTIC ACID, ED    EKG EKG Interpretation  Date/Time:  Thursday July 21 2017 14:35:09 EDT Ventricular Rate:  48 PR Interval:    QRS Duration: 113 QT Interval:  482 QTC Calculation: 431 R Axis:   10 Text Interpretation:  Age not entered, assumed to be  72 years old for purpose of ECG interpretation Sinus bradycardia Inferior infarct, old Lateral leads are also involved similar to previous with increased bradycardia Confirmed by Charlesetta Shanks [phone]) on 07/21/2017 4:08:31 PM   Radiology Dg Chest Port 1 View  Result Date: 07/21/2017 CLINICAL DATA:  Shortness of breath EXAM: PORTABLE CHEST 1 VIEW COMPARISON:  06/20/2017 FINDINGS: Low lung volumes are present, causing crowding of the pulmonary vasculature. The patient is rotated to the right on today's radiograph, reducing diagnostic sensitivity and specificity. Suspected mild enlargement of the cardiopericardial silhouette vague prominence of the right hilum is likely attributable to the rightward rotation. IMPRESSION: 1. Low lung volumes are present, causing crowding of the pulmonary vasculature. 2. Mild enlargement of the cardiopericardial silhouette. 3. Reduced exam sensitivity due to rightward rotation. Electronically Signed   By: Van Clines M.D.   On: 07/21/2017 15:53    Procedures Procedures (including critical care time) CRITICAL CARE Performed by: Charlesetta Shanks   Total critical care time: 30 minutes  Critical care time was exclusive of separately billable procedures and treating other patients.  Critical care was necessary to treat or prevent imminent or life-threatening  deterioration.  Critical care was time spent personally by me on the following activities: development of treatment plan with patient and/or surrogate as well as nursing, discussions with consultants, evaluation of patient's response to treatment, examination of patient, obtaining history from patient or surrogate, ordering and performing treatments and interventions, ordering and review of laboratory studies, ordering and review of radiographic studies, pulse oximetry and re-evaluation of patient's condition. Medications Ordered in ED Medications  albuterol (PROVENTIL,VENTOLIN) solution continuous neb (10 mg/hr Nebulization New Bag/Given 07/21/17 1655)  sodium bicarbonate injection 50 mEq (50 mEq Intravenous Given 07/21/17 1638)  calcium gluconate 1 g in sodium chloride 0.9 % 100 mL IVPB (0 g Intravenous Stopped 07/21/17 1705)  dextrose 50 % solution 50 mL (50 mLs Intravenous Given 07/21/17 1640)  insulin aspart (novoLOG) injection 5 Units (5 Units Intravenous Given 07/21/17 1639)  0.9 %  sodium chloride infusion ( Intravenous New Bag/Given 07/21/17 1646)  sodium chloride 0.9 % bolus 250 mL (0 mLs Intravenous Stopped 07/21/17 1726)     Initial Impression / Assessment and Plan / ED Course  I have reviewed the triage vital signs and the nursing notes.  Pertinent labs & imaging results that were available during my care of the patient were reviewed by me and considered in my medical decision making (see chart for details).    Bladder scan does not show significant urinary retention.  (18: 00) urinalysis is still pending have requested a cath specimen.  Final Clinical Impressions(s) / ED Diagnoses   Final diagnoses:  Hyperkalemia  Acute renal failure, unspecified acute renal failure type ([place])  Severe comorbid illness   Patient is alert and interactive but mild confusion.  This appears to have been an insidious process.  She has been in [facility] for rehabilitation since hospitalization for  UTI.  Patient labs obtained and patient found to be severely hyperkalemic new renal failure.  This is confirmed and treatment for hyperkalemia initiated in the emergency department.  His mental status remains alert and interactive.  She is chronically ill with severe deconditioning.  Plan will be for admission. ED Discharge Orders    None       Charlesetta Shanks, MD 07/21/17 [phone]

## 2017-07-21 NOTE — Progress Notes (Signed)
Received report.  Room ready.   

## 2017-07-21 NOTE — H&P (Addendum)
History and Physical    DOA: 07/21/2017  PCP: Briscoe Deutscher, DO  Patient coming from: Rehab center  Chief Complaint: AMS noted by family  HPI: [name] is a 72 y.o. female with past medical history h/o insulin-dependent diabetes mellitus, CAD, depression, anxiety, esophageal stricture, hiatal hernia/GERD, hypertension hyperlipidemia, diastolic CHF, chronic hypoxic respiratory failure who is on home O2 2 L at baseline and seizure disorder who was last admitted here in January 1610 for complicated UTI, now presents with altered mental status from the rehab center at [facility]. Patient awake and communicating but extremely disoriented.  Most of the history obtained from daughter at bedside.  Per daughter patient was hospitalized at [facility] from June 10 to June 26 for acute MI and underwent cardiac cath.  Patient apparently had creatinine of 2 prior to cath and risks of contrast nephropathy were discussed with daughter.  Daughter not aware of discharge creatinine but states her Lasix was changed to as needed at the time of discharge.  Patient on multiple sedatives at baseline.  Per daughter patient has had Xanax dependence for many years.  She apparently at baseline was mostly bedbound at home and needed assistance with most of her ADLs.  According to daughter, patient had home physical therapy many times through her PCP but she would decline services as she preferred to be in bed most of the time.  Daughter denies any history of back problems or arthritis limiting her mobility and feels its mostly lack of initiation.  Per daughter patient was tired but mostly back to her baseline prior to discharge from [facility].  Since she has been at rehab her mental status has progressively declined.  She does describe an episode of visual hallucinations when patient complained of seeing a turtle outside her room.  Rehab stay was also complicated by an episode of severe epistaxis last  Tuesday and daughter believes some labs were obtained on that day.  According to [facility] from the rehab center, patient was resumed on Lasix since July 12 at 20 mg daily and also received an additional dose of 40 mg on July 8 and July 10 for swelling in her extremities.  Patient was also receiving potassium supplements at rehab.  Lab work in the ED revealed acute renal failure (creatinine bumped from 1-4) and hyperkalemia.    Review of Systems: As per HPI otherwise 10 point review of systems negative.    Past Medical History:  Diagnosis Date  . Anxiety   . Coronary artery disease   . Depression   . Diabetes mellitus    insulin dependent  . Edema of lower extremity   . Esophageal stricture 2008   EGD  . Fatigue   . GERD (gastroesophageal reflux disease)   . Heart murmur   . Hiatal hernia 2008   EGD  . Hyperlipidemia   . Hypertension   . Knee pain   . Morbid obesity ([place])   . Nonproductive cough    chronic  . Orthopnea   . Seizure disorder ([place])   . SOB (shortness of breath)   . Syncope and collapse   . Tremor     Past Surgical History:  Procedure Laterality Date  . ABDOMINAL HYSTERECTOMY    . CARDIAC CATHETERIZATION  03/28/2009  . KNEE ARTHROSCOPY    . TRANSTHORACIC ECHOCARDIOGRAM     showed ef of 65% with no regional wall motion abnormalities   Social history  reports that she quit smoking about 47  years ago. She has never used smokeless tobacco. She reports that she does not drink alcohol or use drugs.  Bedbound at baseline as described above and currently at rehab center.    Allergies  Allergen Reactions  . Codeine Itching    Family History  Problem Relation Age of Onset  . Heart attack Mother   . Hypertension Mother   . COPD Father   . Heart disease Sister   . Diabetes Brother   . Diabetes Brother   . Prostate cancer Brother   . Kidney disease Brother       Prior to Admission medications   Medication Sig Start Date End Date Taking? Authorizing  Provider  ALPRAZolam Duanne Moron) 0.5 MG tablet Take 1 tablet (0.5 mg total) by mouth 2 (two) times daily. 01/21/17  Yes Briscoe Deutscher, DO  aspirin EC 325 MG EC tablet Take 1 tablet (325 mg total) by mouth daily. 09/02/14  Yes Rai, Ripudeep K, MD  furosemide (LASIX) 20 MG tablet Take 20 mg by mouth daily.   Yes [provider]  gabapentin (NEURONTIN) 300 MG capsule Take 300 mg by mouth daily.   Yes [provider]  Insulin Detemir (LEVEMIR FLEXTOUCH) 100 UNIT/ML Pen Inject 20 units into the skin every morning. 03/25/17  Yes Renato Shin, MD  isosorbide mononitrate (IMDUR) 60 MG 24 hr tablet Take 60 mg by mouth daily.    Yes [provider]  metoprolol (LOPRESSOR) 50 MG tablet Take 50 mg by mouth daily.  07/02/14  Yes [provider]  ondansetron (ZOFRAN) 4 MG tablet Take 4 mg by mouth every 6 (six) hours as needed for nausea or vomiting.   Yes [provider]  oxymetazoline (AFRIN) 0.05 % nasal spray Place 1 spray into both nostrils 2 (two) times daily as needed (nosebleeds).   Yes [provider]  potassium chloride SA (K-DUR,KLOR-CON) 20 MEQ tablet Take 20 mEq by mouth daily.   Yes [provider]  prasugrel (EFFIENT) 10 MG TABS tablet Take 10 mg by mouth daily.   Yes [provider]  rizatriptan (MAXALT) 10 MG tablet Take 1 tablet (10 mg total) by mouth as needed for migraine. May repeat in 2 hours if needed Patient taking differently: Take 10 mg by mouth daily as needed (for chronic pain).  09/22/16  Yes Briscoe Deutscher, DO  rosuvastatin (CRESTOR) 10 MG tablet Take 1 tablet (10 mg total) by mouth daily. Patient taking differently: Take 10 mg by mouth every evening.  01/21/17  Yes Briscoe Deutscher, DO  sertraline (ZOLOFT) 100 MG tablet Take 1 tablet (100 mg total) by mouth every morning. 01/21/17  Yes Briscoe Deutscher, DO  sodium chloride (OCEAN) 0.65 % SOLN nasal spray Place 2 sprays into both nostrils 3 (three) times daily.   Yes  [provider]  sulfamethoxazole-trimethoprim (BACTRIM DS,SEPTRA DS) 800-160 MG tablet Take 1 tablet by mouth 2 (two) times daily. 07/15/17 07/22/17 Yes [provider]  traMADol (ULTRAM) 50 MG tablet Take 50 mg by mouth every 6 (six) hours as needed for moderate pain or severe pain.   Yes [provider]  valsartan (DIOVAN) 160 MG tablet Take 160 mg by mouth daily.   Yes [provider]  acetaminophen (TYLENOL) 325 MG tablet Take 2 tablets (650 mg total) by mouth every 4 (four) hours as needed for headache or mild pain. Patient not taking: Reported on 07/21/2017 09/12/16   Robbie Lis, MD  glucose blood [facility] VERIO) test strip 1 each by  Other route daily. And lancets 1/day. Dx code E11.9. Patient not taking: Reported on 07/21/2017 03/24/17   Renato Shin, MD  HYDROcodone-acetaminophen (NORCO/VICODIN) 5-325 MG tablet Take 1 tablet by mouth 3 (three) times daily as needed for moderate pain. Patient not taking: Reported on 07/21/2017 01/31/17   Briscoe Deutscher, DO  hydrOXYzine (ATARAX/VISTARIL) 10 MG tablet Take 1 tablet (10 mg total) by mouth 2 (two) times daily as needed for itching. Patient not taking: Reported on 07/21/2017 09/22/16   Briscoe Deutscher, DO  levETIRAcetam (KEPPRA) 1000 MG tablet Take 1 tablet (1,000 mg total) by mouth 2 (two) times daily. Patient not taking: Reported on 07/21/2017 08/30/16   Briscoe Deutscher, DO  methocarbamol (ROBAXIN) 500 MG tablet Take 1 tablet (500 mg total) by mouth 3 (three) times daily. Patient not taking: Reported on 07/21/2017 01/27/17   Geradine Girt, DO  polyethylene glycol (MIRALAX / GLYCOLAX) packet Take 17 g by mouth daily as needed for mild constipation. Patient not taking: Reported on 07/21/2017 01/27/17   Geradine Girt, DO    Physical Exam: Vitals:   07/21/17 1545 07/21/17 1600 07/21/17 1645 07/21/17 1655  BP: (!) 124/91 122/63    Pulse: (!) 47  96   Resp:   (!) 21   Temp:      TempSrc:      SpO2: 100%  100%  100%    Constitutional: NAD, calm, comfortable Vitals:   07/21/17 1545 07/21/17 1600 07/21/17 1645 07/21/17 1655  BP: (!) 124/91 122/63    Pulse: (!) 47  96   Resp:   (!) 21   Temp:      TempSrc:      SpO2: 100%  100% 100%   Eyes: PERRL, lids and conjunctivae normal ENMT: Mucous membranes are dry with crusting. Posterior pharynx clear of any exudate or lesions.Normal dentition.  Neck: normal, supple, no masses, no thyromegaly Respiratory: clear to auscultation bilaterally, no wheezing, no crackles. Normal respiratory effort. No accessory muscle use.  Cardiovascular: Regular rate and rhythm, grade 2 systolic aortic murmur  No extremity edema. 2+ pedal pulses. No carotid bruits.  Abdomen: no tenderness, no masses palpated. No hepatosplenomegaly. Bowel sounds positive.  Musculoskeletal: no clubbing / cyanosis. No joint deformity upper and lower extremities. Good ROM, no contractures. Normal muscle tone.  Neurologic: Patient is awake and alert, communicating but disoriented to place and time. She is noted to be moving all extremities.  Her strength appears to be normal.  Could not assess further due to mental status. Psychiatric: Normal judgment and insight. Alert and oriented x 3. Normal mood.  SKIN/catheters: no rashes, lesions, ulcers. No induration  Labs on Admission: I have personally reviewed following labs and imaging studies  CBC: Recent Labs  Lab 07/21/17 1524 07/21/17 1604  WBC 6.0  --   NEUTROABS 4.3  --   HGB 8.0* 8.8*  HCT 26.9* 26.0*  MCV 97.8  --   PLT 179  --    Basic Metabolic Panel: Recent Labs  Lab 07/21/17 1524 07/21/17 1604  NA 136 134*  K 6.8* 6.7*  CL 101 102  CO2 26  --   GLUCOSE 78 74  BUN 57* 56*  CREATININE 4.70* 4.70*  CALCIUM 8.7*  --   MG 2.1  --   PHOS 6.8*  --    GFR: CrCl cannot be calculated (Unknown ideal weight.). Liver Function Tests: Recent Labs  Lab 07/21/17 1524  AST 65*  ALT 26  ALKPHOS 80  BILITOT 0.4  PROT  6.5    ALBUMIN 3.2*   Recent Labs  Lab 07/21/17 1524  LIPASE 25   No results for input(s): AMMONIA in the last 168 hours. Coagulation Profile: Recent Labs  Lab 07/21/17 1524  INR 1.24   Cardiac Enzymes: No results for input(s): CKTOTAL, CKMB, CKMBINDEX, TROPONINI in the last 168 hours. BNP (last 3 results) No results for input(s): PROBNP in the last 8760 hours. HbA1C: No results for input(s): HGBA1C in the last 72 hours. CBG: No results for input(s): GLUCAP in the last 168 hours. Lipid Profile: No results for input(s): CHOL, HDL, LDLCALC, TRIG, CHOLHDL, LDLDIRECT in the last 72 hours. Thyroid Function Tests: No results for input(s): TSH, T4TOTAL, FREET4, T3FREE, THYROIDAB in the last 72 hours. Anemia Panel: No results for input(s): VITAMINB12, FOLATE, FERRITIN, TIBC, IRON, RETICCTPCT in the last 72 hours. Urine analysis:    Component Value Date/Time   COLORURINE ORANGE (A) 01/23/2017 2240   APPEARANCEUR CLEAR 01/23/2017 2240   LABSPEC 1.025 01/23/2017 2240   PHURINE 5.0 01/23/2017 2240   GLUCOSEU >=500 (A) 01/23/2017 2240   HGBUR NEGATIVE 01/23/2017 2240   BILIRUBINUR NEGATIVE 01/23/2017 2240   BILIRUBINUR Negative 01/21/2017 [place] 01/23/2017 2240   PROTEINUR >=300 (A) 01/23/2017 2240   UROBILINOGEN 0.2 01/21/2017 1044   UROBILINOGEN 0.2 08/28/2014 1555   NITRITE POSITIVE (A) 01/23/2017 2240   LEUKOCYTESUR NEGATIVE 01/23/2017 2240    Radiological Exams on Admission: Dg Chest Port 1 View  Result Date: 07/21/2017 CLINICAL DATA:  Shortness of breath EXAM: PORTABLE CHEST 1 VIEW COMPARISON:  06/20/2017 FINDINGS: Low lung volumes are present, causing crowding of the pulmonary vasculature. The patient is rotated to the right on today's radiograph, reducing diagnostic sensitivity and specificity. Suspected mild enlargement of the cardiopericardial silhouette vague prominence of the right hilum is likely attributable to the rightward rotation. IMPRESSION: 1.  Low lung volumes are present, causing crowding of the pulmonary vasculature. 2. Mild enlargement of the cardiopericardial silhouette. 3. Reduced exam sensitivity due to rightward rotation. Electronically Signed   By: Van Clines M.D.   On: 07/21/2017 15:53    EKG: Independently reviewed.  Sinus bradycardia, no evidence of peaked T waves or other ST-T changes  Assessment Active Problems:   * No active hospital problems. *     Plan:   1.  Metabolic encephalopathy: POA. Secondary to acute renal failure/dehydration as well as polypharmacy.  Will hold benzodiazepines/opiates and other sedatives for now.  Can use Seroquel or Zyprexa as needed for hallucinations/delirium.  Will allow pured diet if passes bedside swallow evaluation.  Check UA given recent admission for pyelonephritis.  2.  AKI/hyperkalemia: Secondary to contrast nephropathy from recent cardiac cath versus overdiuresis.  Will hold ACE inhibitor/diuretics and other nephrotoxic agents.  Patient received 2 L of IV fluids in the ER.  Will place on maintenance fluids at 75/h.  She received D50 and insulin 5 units in the ER for hyperkalemia.  Her blood glucose check during my interview was 57.  Another amp of D50 given.  Will give back-to-back nebs and Kayexalate.  Repeat BMP tonight and in a.m.  She did receive calcium gluconate in the ED  3. CAD and hx of chronic diastolic HF: continue chronic dual antiplatelet therapy (asa and Effient), statin, BB.  Status post recent cardiac cath with patent stents per daughter's report.  Watch for fluid overload while on IV fluids.  Last echo here in September 2018 showed normal EF.  Consider obtaining records from [facility]  4. Insulin dependent type II DM: She follows endocrinology as outpatient and last seen in March when her insulin regimen was changed to Levemir 20 units every morning. Given hypoglycemia in the ED, limited oral intake and AKI, will hold long-acting insulin.  Place on sliding  scale insulin for now  5. Chronic hypoxic respiratory failure: continue chronic home O2 requirement, 2 L  6.Anxiety: On Xanax at baseline.  Hold for now.  May resume lower dose once mental status back to baseline.  Watch for withdrawals.  7. Hx of seizure disorder: Resume Keppra thousand milligrams twice daily.  For some reason patient was not receiving Keppra at rehab center according to the [facility].  8.  History of esophageal stricture: According to daughter this was longtime back patient has not exhibited any signs of dysphagia as noted by family members.  9. HTN: BP noted to be on low side, resume meds at lower dose with holding parameters.   DVT prophylaxis: Lovenox  Code Status: According to daughter patient has always refused intubation but okay for CPR.  Daughter is the healthcare proxy/next of kin as apparently has been unable to make decisions.  Patient was partial code previously according to our records  Family Communication: Discussed with patient and   daughter [name], can be reached at [phone]            Consults called: None Admission status: Patient admitted as inpatient as anticipated LOS greater than 2 midnights    Guilford Shi MD [facility] Pager [phone]  If 7PM-7AM, please contact night-coverage [email or web address] Password [facility]  07/21/2017, 6:23 PM

## 2017-07-22 ENCOUNTER — Inpatient Hospital Stay (HOSPITAL_COMMUNITY): Payer: Medicare Other

## 2017-07-22 DIAGNOSIS — G9341 Metabolic encephalopathy: Secondary | ICD-10-CM | POA: Diagnosis present

## 2017-07-22 DIAGNOSIS — D638 Anemia in other chronic diseases classified elsewhere: Secondary | ICD-10-CM | POA: Diagnosis present

## 2017-07-22 DIAGNOSIS — N179 Acute kidney failure, unspecified: Secondary | ICD-10-CM

## 2017-07-22 LAB — BASIC METABOLIC PANEL
ANION GAP: 11 (ref 5–15)
BUN: 58 mg/dL — ABNORMAL HIGH (ref 8–23)
CO2: 26 mmol/L (ref 22–32)
Calcium: 8.8 mg/dL — ABNORMAL LOW (ref 8.9–10.3)
Chloride: 102 mmol/L (ref 98–111)
Creatinine, Ser: 4.67 mg/dL — ABNORMAL HIGH (ref 0.44–1.00)
GFR calc Af Amer: 10 mL/min — ABNORMAL LOW (ref >60)
GFR, EST NON AFRICAN AMERICAN: 9 mL/min — AB (ref >60)
GLUCOSE: 124 mg/dL — AB (ref 70–99)
POTASSIUM: 6.1 mmol/L — AB (ref 3.5–5.1)
Sodium: 139 mmol/L (ref 135–145)

## 2017-07-22 LAB — VITAMIN B12: Vitamin B-12: 912 pg/mL (ref 180–914)

## 2017-07-22 LAB — FERRITIN: Ferritin: 43 ng/mL (ref 11–307)

## 2017-07-22 LAB — CBC
HCT: 25.9 % — ABNORMAL LOW (ref 36.0–46.0)
HEMOGLOBIN: 7.9 g/dL — AB (ref 12.0–15.0)
MCH: 29.4 pg (ref 26.0–34.0)
MCHC: 30.5 g/dL (ref 30.0–36.0)
MCV: 96.3 fL (ref 78.0–100.0)
Platelets: 177 10*3/uL (ref 150–400)
RBC: 2.69 MIL/uL — AB (ref 3.87–5.11)
RDW: 15.5 % (ref 11.5–15.5)
WBC: 6.2 10*3/uL (ref 4.0–10.5)

## 2017-07-22 LAB — GLUCOSE, CAPILLARY
GLUCOSE-CAPILLARY: 90 mg/dL (ref 70–99)
Glucose-Capillary: 82 mg/dL (ref 70–99)
Glucose-Capillary: 149 mg/dL — ABNORMAL HIGH (ref 70–99)
Glucose-Capillary: 159 mg/dL — ABNORMAL HIGH (ref 70–99)
Glucose-Capillary: 121 mg/dL — ABNORMAL HIGH (ref 70–99)

## 2017-07-22 LAB — PREPARE RBC (CROSSMATCH)

## 2017-07-22 LAB — RETICULOCYTES
RBC.: 2.49 MIL/uL — ABNORMAL LOW (ref 3.87–5.11)
Retic Count, Absolute: 39.8 10*3/uL (ref 19.0–186.0)
Retic Ct Pct: 1.6 % (ref 0.4–3.1)

## 2017-07-22 LAB — AMMONIA: Ammonia: 22 umol/L (ref 9–35)

## 2017-07-22 LAB — CK: Total CK: 1270 U/L — ABNORMAL HIGH (ref 38–234)

## 2017-07-22 LAB — HEMOGLOBIN A1C
Hgb A1c MFr Bld: 7.9 % — ABNORMAL HIGH (ref 4.8–5.6)
Mean Plasma Glucose: 180.03 mg/dL

## 2017-07-22 LAB — IRON AND TIBC
Iron: 47 ug/dL (ref 28–170)
SATURATION RATIOS: 19 % (ref 10.4–31.8)
TIBC: 253 ug/dL (ref 250–450)
UIBC: 206 ug/dL

## 2017-07-22 LAB — TSH: TSH: 1.279 u[IU]/mL (ref 0.350–4.500)

## 2017-07-22 LAB — FOLATE: Folate: 8.9 ng/mL (ref >5.9)

## 2017-07-22 MED ORDER — WHITE PETROLATUM EX OINT
TOPICAL_OINTMENT | CUTANEOUS | Status: AC
  Administered 2017-07-22: 0.2
  Filled 2017-07-22: qty 28.35

## 2017-07-22 MED ORDER — SODIUM CHLORIDE 0.9% IV SOLUTION
Freq: Once | INTRAVENOUS | Status: AC
  Administered 2017-07-22: 17:00:00 via INTRAVENOUS

## 2017-07-22 MED ORDER — SODIUM CHLORIDE 0.9 % IV SOLN
1.0000 g | Freq: Once | INTRAVENOUS | Status: AC
  Administered 2017-07-22: 1 g via INTRAVENOUS
  Filled 2017-07-22: qty 10

## 2017-07-22 MED ORDER — ALPRAZOLAM 0.5 MG PO TABS
0.5000 mg | ORAL_TABLET | Freq: Two times a day (BID) | ORAL | Status: DC
  Administered 2017-07-22 – 2017-07-27 (×8): 0.5 mg via ORAL
  Filled 2017-07-22 (×7): qty 1
  Filled 2017-07-22: qty 2
  Filled 2017-07-22 (×2): qty 1

## 2017-07-22 MED ORDER — INSULIN ASPART 100 UNIT/ML ~~LOC~~ SOLN
0.0000 [IU] | Freq: Three times a day (TID) | SUBCUTANEOUS | Status: DC
  Administered 2017-07-22: 2 [IU] via SUBCUTANEOUS
  Administered 2017-07-23 – 2017-07-25 (×3): 1 [IU] via SUBCUTANEOUS
  Administered 2017-07-26: 2 [IU] via SUBCUTANEOUS
  Administered 2017-07-26 – 2017-07-27 (×2): 1 [IU] via SUBCUTANEOUS

## 2017-07-22 MED ORDER — INSULIN ASPART 100 UNIT/ML ~~LOC~~ SOLN: 0.0000 [IU] | Freq: Three times a day (TID) | SUBCUTANEOUS | Status: DC

## 2017-07-22 MED ORDER — INSULIN ASPART 100 UNIT/ML ~~LOC~~ SOLN: 0.0000 [IU] | Freq: Every day | SUBCUTANEOUS | Status: DC

## 2017-07-22 MED ORDER — SODIUM POLYSTYRENE SULFONATE 15 GM/60ML PO SUSP
30.0000 g | ORAL | Status: AC
  Administered 2017-07-22 (×2): 30 g via ORAL
  Filled 2017-07-22 (×3): qty 120

## 2017-07-22 NOTE — Progress Notes (Signed)
Patient continue to be confused. She able to take mittens off and pulled her IV out. Patient continue to fidget in her room. Has not sleep at this moment.

## 2017-07-22 NOTE — Consult Note (Signed)
Renal Service Consult Note [facility]  [name] 07/22/2017 Kathi Ludwig Requesting Physician:  [facility], Dr. Earnest Conroy  Reason for Consult:  Acute renal injury HPI: The patient is a 72 y.o. year-old female w/ a PMHx notable for insulin dependent DMII, CAD, MDD, MAD, esophageal stricture, hiatal hernia/GERD, HTN, HLD, diastolic CHF, chronic hypoxic respiratory failure on home O2 @ 2L via [place] and a seizure disorder last admitted in Janurary of 3428 for a complicated UTI, and presented for this hospitalization from [facility] for progressively worsening encephalopathy and increased creatinine. Upon admission she was noted to have a SCr of 4.7, potassium 6.8, Hgb 7.9 (down from 10.4 5 months prior) and total CK of 1270.  ROS-Unable to assess due to the patients mental status  Past Medical History  Past Medical History:  Diagnosis Date  . Anxiety   . Coronary artery disease   . Depression   . Diabetes mellitus    insulin dependent  . Edema of lower extremity   . Esophageal stricture 2008   EGD  . Fatigue   . GERD (gastroesophageal reflux disease)   . Heart murmur   . Hiatal hernia 2008   EGD  . Hyperlipidemia   . Hypertension   . Knee pain   . Morbid obesity ([place])   . Nonproductive cough    chronic  . Orthopnea   . Seizure disorder ([place])   . SOB (shortness of breath)   . Syncope and collapse   . Tremor    Past Surgical History  Past Surgical History:  Procedure Laterality Date  . ABDOMINAL HYSTERECTOMY    . CARDIAC CATHETERIZATION  03/28/2009  . KNEE ARTHROSCOPY    . TRANSTHORACIC ECHOCARDIOGRAM     showed ef of 65% with no regional wall motion abnormalities   Family History  Family History  Problem Relation Age of Onset  . Heart attack Mother   . Hypertension Mother   . COPD Father   . Heart disease Sister   . Diabetes Brother   . Diabetes Brother   . Prostate cancer Brother   . Kidney disease Brother    Social  History  reports that she quit smoking about 47 years ago. She has never used smokeless tobacco. She reports that she does not drink alcohol or use drugs. Allergies  Allergies  Allergen Reactions  . Codeine Itching   Home medications Prior to Admission medications   Medication Sig Start Date End Date Taking? Authorizing Provider  ALPRAZolam Duanne Moron) 0.5 MG tablet Take 1 tablet (0.5 mg total) by mouth 2 (two) times daily. 01/21/17  Yes Briscoe Deutscher, DO  aspirin EC 325 MG EC tablet Take 1 tablet (325 mg total) by mouth daily. 09/02/14  Yes Rai, Ripudeep K, MD  furosemide (LASIX) 20 MG tablet Take 20 mg by mouth daily.   Yes [provider]  gabapentin (NEURONTIN) 300 MG capsule Take 300 mg by mouth daily.   Yes [provider]  Insulin Detemir (LEVEMIR FLEXTOUCH) 100 UNIT/ML Pen Inject 20 units into the skin every morning. 03/25/17  Yes Renato Shin, MD  isosorbide mononitrate (IMDUR) 60 MG 24 hr tablet Take 60 mg by mouth daily.    Yes [provider]  metoprolol (LOPRESSOR) 50 MG tablet Take 50 mg by mouth daily.  07/02/14  Yes [provider]  ondansetron (ZOFRAN) 4 MG tablet Take 4 mg by mouth every 6 (six) hours as needed for nausea or vomiting.   Yes [provider]  oxymetazoline (AFRIN) 0.05 % nasal spray Place 1 spray into both nostrils 2 (two) times daily as needed (nosebleeds).   Yes [provider]  potassium chloride SA (K-DUR,KLOR-CON) 20 MEQ tablet Take 20 mEq by mouth daily.   Yes [provider]  prasugrel (EFFIENT) 10 MG TABS tablet Take 10 mg by mouth daily.   Yes [provider]  rizatriptan (MAXALT) 10 MG tablet Take 1 tablet (10 mg total) by mouth as needed for migraine. May repeat in 2 hours if needed Patient taking differently: Take 10 mg by mouth daily as needed (for chronic pain).  09/22/16  Yes Briscoe Deutscher, DO  rosuvastatin (CRESTOR) 10 MG tablet Take 1 tablet (10 mg total) by mouth daily. Patient  taking differently: Take 10 mg by mouth every evening.  01/21/17  Yes Briscoe Deutscher, DO  sertraline (ZOLOFT) 100 MG tablet Take 1 tablet (100 mg total) by mouth every morning. 01/21/17  Yes Briscoe Deutscher, DO  sodium chloride (OCEAN) 0.65 % SOLN nasal spray Place 2 sprays into both nostrils 3 (three) times daily.   Yes [provider]  sulfamethoxazole-trimethoprim (BACTRIM DS,SEPTRA DS) 800-160 MG tablet Take 1 tablet by mouth 2 (two) times daily. 07/15/17 07/22/17 Yes [provider]  traMADol (ULTRAM) 50 MG tablet Take 50 mg by mouth every 6 (six) hours as needed for moderate pain or severe pain.   Yes [provider]  valsartan (DIOVAN) 160 MG tablet Take 160 mg by mouth daily.   Yes [provider]  acetaminophen (TYLENOL) 325 MG tablet Take 2 tablets (650 mg total) by mouth every 4 (four) hours as needed for headache or mild pain. Patient not taking: Reported on 07/21/2017 09/12/16   Robbie Lis, MD  glucose blood [facility] VERIO) test strip 1 each by Other route daily. And lancets 1/day. Dx code E11.9. Patient not taking: Reported on 07/21/2017 03/24/17   Renato Shin, MD  HYDROcodone-acetaminophen (NORCO/VICODIN) 5-325 MG tablet Take 1 tablet by mouth 3 (three) times daily as needed for moderate pain. Patient not taking: Reported on 07/21/2017 01/31/17   Briscoe Deutscher, DO  hydrOXYzine (ATARAX/VISTARIL) 10 MG tablet Take 1 tablet (10 mg total) by mouth 2 (two) times daily as needed for itching. Patient not taking: Reported on 07/21/2017 09/22/16   Briscoe Deutscher, DO  levETIRAcetam (KEPPRA) 1000 MG tablet Take 1 tablet (1,000 mg total) by mouth 2 (two) times daily. Patient not taking: Reported on 07/21/2017 08/30/16   Briscoe Deutscher, DO  methocarbamol (ROBAXIN) 500 MG tablet Take 1 tablet (500 mg total) by mouth 3 (three) times daily. Patient not taking: Reported on 07/21/2017 01/27/17   Geradine Girt, DO  polyethylene glycol (MIRALAX / GLYCOLAX) packet Take 17 g by  mouth daily as needed for mild constipation. Patient not taking: Reported on 07/21/2017 01/27/17   Geradine Girt, DO   Liver Function Tests Recent Labs  Lab 07/21/17 1524  AST 65*  ALT 26  ALKPHOS 80  BILITOT 0.4  PROT 6.5  ALBUMIN 3.2*   Recent Labs  Lab 07/21/17 1524  LIPASE 25   CBC Recent Labs  Lab 07/21/17 1524 07/21/17 1604 07/22/17 0119  WBC 6.0  --  6.2  NEUTROABS 4.3  --   --   HGB 8.0* 8.8* 7.9*  HCT 26.9* 26.0* 25.9*  MCV 97.8  --  96.3  PLT 179  --  580   Basic Metabolic Panel Recent Labs  Lab 07/21/17 1524 07/21/17 1604 07/21/17 1847 07/22/17 0119  NA  136 134* 138 139  K 6.8* 6.7* 5.5* 6.1*  CL 101 102 102 102  CO2 26  --  26 26  GLUCOSE 78 74 49* 124*  BUN 57* 56* 56* 58*  CREATININE 4.70* 4.70* 4.67* 4.67*  CALCIUM 8.7*  --  8.7* 8.8*  PHOS 6.8*  --   --   --    Iron/TIBC/Ferritin/ %Sat    Component Value Date/Time   IRON 60 04/19/2006 1037   FERRITIN 22.3 04/19/2006 1037    Vitals:   07/21/17 1800 07/21/17 2015 07/22/17 0504 07/22/17 0508  BP:  (!) 95/42 97/72   Pulse: 60 (!) 59 77   Resp: 18 18 18    Temp:  98.3 F (36.8 C) 97.6 F (36.4 C)   TempSrc:  Oral    SpO2: 100% 100% (!) 86% 93%   Exam Gen: Patient is not oriented to person, location, status, reason for visit or date, she is listless, does not respond appropriately to commands making random statements which are rarely germane.   No rash, cyanosis or gangrene No jvd  Chest clear bilat RRR no MRG  Abd soft ntnd no mass or ascites +bs  Ext bilateral distal lower nonedematous, no wounds or ulcers    Home meds: No current facility-administered medications on file prior to encounter.    Current Outpatient Medications on File Prior to Encounter  Medication Sig Dispense Refill  . ALPRAZolam (XANAX) 0.5 MG tablet Take 1 tablet (0.5 mg total) by mouth 2 (two) times daily. 60 tablet 2  . aspirin EC 325 MG EC tablet Take 1 tablet (325 mg total) by mouth daily. 30 tablet 0   . furosemide (LASIX) 20 MG tablet Take 20 mg by mouth daily.    Marland Kitchen gabapentin (NEURONTIN) 300 MG capsule Take 300 mg by mouth daily.    . Insulin Detemir (LEVEMIR FLEXTOUCH) 100 UNIT/ML Pen Inject 20 units into the skin every morning. 15 mL 11  . isosorbide mononitrate (IMDUR) 60 MG 24 hr tablet Take 60 mg by mouth daily.     . metoprolol (LOPRESSOR) 50 MG tablet Take 50 mg by mouth daily.   0  . ondansetron (ZOFRAN) 4 MG tablet Take 4 mg by mouth every 6 (six) hours as needed for nausea or vomiting.    Marland Kitchen oxymetazoline (AFRIN) 0.05 % nasal spray Place 1 spray into both nostrils 2 (two) times daily as needed (nosebleeds).    . potassium chloride SA (K-DUR,KLOR-CON) 20 MEQ tablet Take 20 mEq by mouth daily.    . prasugrel (EFFIENT) 10 MG TABS tablet Take 10 mg by mouth daily.    . rizatriptan (MAXALT) 10 MG tablet Take 1 tablet (10 mg total) by mouth as needed for migraine. May repeat in 2 hours if needed (Patient taking differently: Take 10 mg by mouth daily as needed (for chronic pain). ) 10 tablet 5  . rosuvastatin (CRESTOR) 10 MG tablet Take 1 tablet (10 mg total) by mouth daily. (Patient taking differently: Take 10 mg by mouth every evening. ) 90 tablet 3  . sertraline (ZOLOFT) 100 MG tablet Take 1 tablet (100 mg total) by mouth every morning. 90 tablet 3  . sodium chloride (OCEAN) 0.65 % SOLN nasal spray Place 2 sprays into both nostrils 3 (three) times daily.    Marland Kitchen sulfamethoxazole-trimethoprim (BACTRIM DS,SEPTRA DS) 800-160 MG tablet Take 1 tablet by mouth 2 (two) times daily.    . traMADol (ULTRAM) 50 MG tablet Take 50 mg by mouth every 6 (six)  hours as needed for moderate pain or severe pain.    . valsartan (DIOVAN) 160 MG tablet Take 160 mg by mouth daily.    Marland Kitchen acetaminophen (TYLENOL) 325 MG tablet Take 2 tablets (650 mg total) by mouth every 4 (four) hours as needed for headache or mild pain. (Patient not taking: Reported on 07/21/2017) 30 tablet 0  . glucose blood (ONETOUCH VERIO) test  strip 1 each by Other route daily. And lancets 1/day. Dx code E11.9. (Patient not taking: Reported on 07/21/2017) 100 each 3  . HYDROcodone-acetaminophen (NORCO/VICODIN) 5-325 MG tablet Take 1 tablet by mouth 3 (three) times daily as needed for moderate pain. (Patient not taking: Reported on 07/21/2017) 20 tablet 0  . hydrOXYzine (ATARAX/VISTARIL) 10 MG tablet Take 1 tablet (10 mg total) by mouth 2 (two) times daily as needed for itching. (Patient not taking: Reported on 07/21/2017) 30 tablet 5  . levETIRAcetam (KEPPRA) 1000 MG tablet Take 1 tablet (1,000 mg total) by mouth 2 (two) times daily. (Patient not taking: Reported on 07/21/2017) 60 tablet 0  . methocarbamol (ROBAXIN) 500 MG tablet Take 1 tablet (500 mg total) by mouth 3 (three) times daily. (Patient not taking: Reported on 07/21/2017) 15 tablet 0  . polyethylene glycol (MIRALAX / GLYCOLAX) packet Take 17 g by mouth daily as needed for mild constipation. (Patient not taking: Reported on 07/21/2017) 14 each 0   Assessment/Plan: 1.Renal-Likely Acute renal injury 2/2 to contrast toxicity and hypotension. Most likely secondary to the contrast dye load with her prior cardiac cath. There is concern but no clearly identifiable indication that this is associated with micro atherosclerotic emboli from the cath. Serum creatinine remains elevated to near 4.7 despite rehydration. There is no acute indication for dialysis at this stage but we will monitor her renal function and electrolytes closely for changes and hope that her mental status improves as we may likely need to discuss potential dialysis.   -Continue strict I/O's  -Continue Daily weights  -Daily BMP's to monitor renal function 2. Hypertension/volume  -  BP low initially. Improved to 131/58 but remaining lower than normal as per family. Will monitor. No documented urinary output. UA ordered.  3. Hyperkalemia  - Most likely secondary to oral potassium supplementation, ARB and renal excretion  impairment.   -Recommend repeat Kayexalate treatment and monitor closely  4. Anemia  - Microcytic anemia of 7.9. No signs of acute blood loss.  5. Acute encephalopathy  - Unclear etiology. Differential includes micro atherosclerotic embolic CVA from the recent cardiac cath, dementia, metabolic disorder but no clear indication that her renal dysfunction is directly related.   Kathi Ludwig, MD PGY-2, Internal Medicine Resident  07/22/2017, 3:50 PM

## 2017-07-22 NOTE — Progress Notes (Signed)
Rai, MD ordered one unit of PRBC's to be given to patient. Patient only has one IV that currently has NS @ 100 ml/hr. Patient continues to be verbally and physically abusive to this RN and staff and is refusing to have another IV placed to accommodate blood and continuous fluids needs. At this time, NS paused to transfuse one unit of blood. Will resume NS when unit of blood is completed. Also, patient continues to refuse in and out cath for urine culture. Purewick placed again, but patient is pulling it off. Education provided to patient, but she remains confused. Will continue to educate and encourage patient. Night shift RN to be notified of this information.

## 2017-07-22 NOTE — Progress Notes (Signed)
Told patient that she is getting catheterized for urine sample.  Patient saying  profanity to Probation officer. Refuses to get an In and out cath. Will try with am staff.

## 2017-07-22 NOTE — Progress Notes (Signed)
Inpatient Diabetes Program Recommendations  AACE/ADA: New Consensus Statement on Inpatient Glycemic Control (2015)  Target Ranges:  Prepandial:   less than 140 mg/dL      Peak postprandial:   less than 180 mg/dL (1-2 hours)      Critically ill patients:  140 - 180 mg/dL   Lab Results  Component Value Date   GLUCAP 82 07/21/2017   HGBA1C 10.4 (H) 03/22/2017    Review of Glycemic Control Results for [name], [name] (MRN [id number]) as of 07/22/2017 09:27  Ref. Range 07/21/2017 18:42 07/21/2017 19:34 07/21/2017 20:16  Glucose-Capillary Latest Ref Range: 70 - 99 mg/dL 39 (LL) 100 (H) 82   Diabetes history: Type 2 DM Outpatient Diabetes medications: Levemir 20 units QAM Current orders for Inpatient glycemic control: Novolog 0-15 units TID   Inpatient Diabetes Program Recommendations:    This is a patient of Dr Loanne Drilling, endocrinologist and was last seen in March.   Noted patient had hypoglycemic episode following treatment for hyperkalemia.   Would consider reducing correction to Novolog 0-9 units TID.  Thanks, Bronson Curb, MSN, RNC-OB Diabetes Coordinator [phone] (8a-5p)

## 2017-07-22 NOTE — Progress Notes (Signed)
New Admission Note:  Arrival Method: Stretcher  Mental Orientation: Alert x 2 Telemetry: Box 16 Assessment: Completed Skin: Warm and dry. Bruising all over  IV: NSL Pain:  Denies  Tubes: N/A Safety Measures: Safety Fall Prevention Plan initiated.  Admission: Completed 5 M  Orientation: Patient has been orientated to the room, unit and the staff. Family: Dtr  Orders have been reviewed and implemented. Will continue to monitor the patient. Call light has been placed within reach and bed alarm has been activated.   Sima Matas BSN, RN  Phone Number: [phone]

## 2017-07-22 NOTE — Progress Notes (Signed)
Patient continues to refuse in and out cath requested by MD and states, "Get your damn hands off me!" Education given to patient and husband at bedside on importance of obtaining a urine culture. Husband tried to help patient understand. Patient continues to refuse despite education. Purewick applied with hopes to obtain a proper culture. Will continue to monitor.

## 2017-07-22 NOTE — Evaluation (Signed)
Clinical/Bedside Swallow Evaluation Patient Details  Name: [name] MRN: [id number] Date of Birth: [birth year]  Today's Date: 07/22/2017 Time: SLP Start Time (ACUTE ONLY): 1205 SLP Stop Time (ACUTE ONLY): 1229 SLP Time Calculation (min) (ACUTE ONLY): 24 min  Past Medical History:  Past Medical History:  Diagnosis Date  . Anxiety   . Coronary artery disease   . Depression   . Diabetes mellitus    insulin dependent  . Edema of lower extremity   . Esophageal stricture 2008   EGD  . Fatigue   . GERD (gastroesophageal reflux disease)   . Heart murmur   . Hiatal hernia 2008   EGD  . Hyperlipidemia   . Hypertension   . Knee pain   . Morbid obesity ([place])   . Nonproductive cough    chronic  . Orthopnea   . Seizure disorder ([place])   . SOB (shortness of breath)   . Syncope and collapse   . Tremor    Past Surgical History:  Past Surgical History:  Procedure Laterality Date  . ABDOMINAL HYSTERECTOMY    . CARDIAC CATHETERIZATION  03/28/2009  . KNEE ARTHROSCOPY    . TRANSTHORACIC ECHOCARDIOGRAM     showed ef of 65% with no regional wall motion abnormalities   HPI:  72 yo female adm to [facility] with renal failure.  PMH + for AKI, polypharmacy, GERD/HH, CHF, hallucinating during hospital coarse.  Pt with low lung volumes on cxr, MRI brain negative.  Swallow eval ordered.    Assessment / Plan / Recommendation Clinical Impression  Pt only marginally cooperative, perseverating on wanting to go home and needing her pants and shoes to leave.  She was able to self feed solids, pudding and water.  Pt takes excessively large boluses and masticates while she is talking constantly stuffing her mouth with food.  Despite this, she tolerated po well without indication of aspiration and adequate oral clearance. Her impulsivity and agitation is largest risk factor for aspiration at this time but recommend advance diet to dys3 *dentures and single tooth only* with strict precautions to help maximize  intake with safety.  Advised RN and provided written signs.  SLP to sign off at this time.  Thanks for this consult.  SLP Visit Diagnosis: Dysphagia, unspecified (R13.10)    Aspiration Risk  Mild aspiration risk    Diet Recommendation Dysphagia 3 (Mech soft);Thin liquid   Liquid Administration via: Cup;Straw Medication Administration: Whole meds with liquid Supervision: Patient able to self feed;Full supervision/cueing for compensatory strategies Compensations: Slow rate;Small sips/bites Postural Changes: Seated upright at 90 degrees;Remain upright for at least 30 minutes after po intake    Other  Recommendations Oral Care Recommendations: Oral care BID   Follow up Recommendations None      Frequency and Duration   n/a         Prognosis    n/a    Swallow Study   General Date of Onset: 07/22/17 HPI: 72 yo female adm to [facility] with renal failure.  PMH + for AKI, polypharmacy, GERD/HH, CHF, hallucinating during hospital coarse.  Pt with low lung volumes on cxr, MRI brain negative.  Swallow eval ordered.  Type of Study: Bedside Swallow Evaluation Diet Prior to this Study: Dysphagia 1 (puree);Thin liquids Temperature Spikes Noted: No Respiratory Status: Room air History of Recent Intubation: No Behavior/Cognition: Alert;Cooperative;Pleasant mood Oral Cavity Assessment: Within Functional Limits Oral Care Completed by SLP: No Oral Cavity - Dentition: (some missing dentition, denture) Vision: Functional for self-feeding Self-Feeding Abilities:  Able to feed self Patient Positioning: Upright in bed Baseline Vocal Quality: Normal Volitional Cough: Cognitively unable to elicit Volitional Swallow: Unable to elicit    Oral/Motor/Sensory Function Overall Oral Motor/Sensory Function: Within functional limits   Ice Chips Ice chips: Not tested   Thin Liquid Thin Liquid: Within functional limits Presentation: Self Fed;Straw    Nectar Thick Nectar Thick Liquid: Not tested   Honey Thick  Honey Thick Liquid: Not tested   Puree Puree: Within functional limits Presentation: Self Fed;Spoon   Solid   GO   Solid: Within functional limits Presentation: Self Fed Other Comments: pt takes excessively large boluses and masticates while she is talking, her impulsivity and agitation is largest risk factor for aspiration at this time        Macario Golds 07/22/2017,3:15 PM Luanna Salk, [place] [facility] SLP [phone]

## 2017-07-22 NOTE — Progress Notes (Signed)
[facility]                                                                              Patient Demographics  [name], is a 72 y.o. female, DOB - 20-Jun-1945, ZDG:[id number]  Admit date - 07/21/2017   Admitting Physician Guilford Shi, MD  Outpatient Primary MD for the patient is Briscoe Deutscher, DO  Outpatient specialists:   LOS - 1  days   Medical records reviewed and are as summarized below:    Chief Complaint  Patient presents with  . Abnormal Lab       Brief summary   Patient is a 72 year old female with IDDM, CAD, depression, anxiety, hiatal hernia/GERD, hypertension, hyperlipidemia, diastolic CHF, chronic hypoxic respiratory failure on home O2 2 L, seizure disorder who was admitted in January 5956 for complicated UTI, presented with acute mental status changes from [place] skilled nursing facility.  Patient was admitted in 06/2017 at [facility] for ACS and underwent cardiac cath.  Patient also noted to be on multiple sedatives at baseline and Xanax.  Patient was receiving Lasix and potassium supplementation at skilled nursing facility.  She was started on Lasix 20 mg daily on 7/12 however received additional dose of 40 mg on 7/8 and 7/10.  At the time of discharge from [facility], she was on Lasix as needed only.  Creatinine at the time of discharge from [facility] hospital was 1.1.   Assessment & Plan    Principal Problem:   Acute metabolic encephalopathy -Multifactorial likely due to acute kidney injury, dehydration, polypharmacy. - Rule out UTI, check urine analysis and culture. -Hold narcotics, currently agitated, will restart Xanax to avoid benzodiazepine withdrawals and seizures. -Obtain TSH, B12, folate, ammonia level, CT head  Active Problems: Acute kidney injury with hyperkalemia, rhabdomyolysis Likely due to diuretics, dehydration, rule out UTI -Renal ultrasound showed no acute findings or  hydronephrosis -Creatinine 4.7, baseline 1.1 in 06/2017 -Nephrology consulted, follow UA -Patient has received D50, insulin, calcium gluconate, Kayexalate -Continue to hold Lasix, Diovan, Bactrim, Neurontin  Acute rhabdomyolysis -CK 1270, no report of any falls -Continue IV fluid hydration, hold statins    Essential hypertension -BP currently stable  -Continue imdur, beta-blocker    Hypothyroidism -Obtain TSH, continue Synthroid    Coronary artery disease of native artery of native heart with stable angina pectoris (HCC) -Continue aspirin, Effient, beta-blocker -Hold statin due to rhabdomyolysis    Diabetes mellitus without complication (HCC) -Continue sliding scale insulin.  Overnight hypoglycemia with glucose of 49 -Check hemoglobin A1c, placed on sliding scale insulin, change to sensitive   dyslipidemia -Hold statin due to rhabdo    Morbid obesity (HCC) -BMI 46.3, will need diet and weight control  Chronic anemia  -Hemoglobin 7.9 today, likely due to hemodilution, obtain anemia panel for further work-up -Transfuse 1 unit due to history of CAD and stent  Code Status: Partial DVT Prophylaxis:  Lovenox Family Communication: No family member in the room   Disposition Plan:   Time Spent in minutes   35 minutes  Procedures:  Renal ultrasound  Consultants:   Nephrology  Antimicrobials:  Medications  Scheduled Meds: . aspirin  325 mg Oral Daily  . Chlorhexidine Gluconate Cloth  6 each Topical Q0600  . enoxaparin (LOVENOX) injection  40 mg Subcutaneous Q24H  . insulin aspart  0-15 Units Subcutaneous TID WC  . isosorbide mononitrate  30 mg Oral Daily  . levETIRAcetam  1,000 mg Oral BID  . mouth rinse  15 mL Mouth Rinse BID  . metoprolol tartrate  25 mg Oral Daily  . mupirocin ointment  1 application Nasal BID  . prasugrel  10 mg Oral Daily   Continuous Infusions: . sodium chloride 100 mL/hr at 07/22/17 1108  . albuterol 10 mg/hr (07/21/17 1655)  .  calcium gluconate 1 g (07/22/17 1109)   PRN Meds:.acetaminophen, albuterol, ondansetron, oxymetazoline, polyethylene glycol   Antibiotics   Anti-infectives (From admission, onward)   None        Subjective:   [name] was seen and examined today.  Confused, talking tangentially, unable to obtain any review of system from the patient.  No fevers or chills,  Objective:   Vitals:   07/22/17 0504 07/22/17 0508 07/22/17 1112 07/22/17 1126  BP: 97/72  (!) 131/58 (!) 131/58  Pulse: 77  71 71  Resp: 18   18  Temp: 97.6 F (36.4 C)   97.6 F (36.4 C)  TempSrc:    Oral  SpO2: (!) 86% 93%  97%  Weight:    114.9 kg (253 lb 4.9 oz)  Height:    5\' 2"  (1.575 m)    Intake/Output Summary (Last 24 hours) at 07/22/2017 1153 Last data filed at 07/22/2017 1140 Gross per 24 hour  Intake 1827.87 ml  Output 0 ml  Net 1827.87 ml     Wt Readings from Last 3 Encounters:  07/22/17 114.9 kg (253 lb 4.9 oz)  03/22/17 104.1 kg (229 lb 9.6 oz)  01/21/17 105.7 kg (233 lb)     Exam  General: Confused  Eyes:   HEENT:  Atraumatic, normocephalic, normal oropharynx  Cardiovascular: S1 S2 auscultated,  Regular rate and rhythm.  Respiratory: Clear to auscultation bilaterally, no wheezing, rales or rhonchi  Gastrointestinal: Soft, nontender, nondistended, + bowel sounds  Ext: no pedal edema bilaterally  Neuro: moving all 4 extremities spontaneously but not cooperative with the neuro exam  Musculoskeletal: No digital cyanosis, clubbing  Skin: No rashes  Psych: confused   Data Reviewed:  I have personally reviewed following labs and imaging studies  Micro Results Recent Results (from the past 240 hour(s))  MRSA PCR Screening     Status: Abnormal   Collection Time: 07/21/17  8:12 PM  Result Value Ref Range Status   MRSA by PCR POSITIVE (A) NEGATIVE Final    Comment:        The GeneXpert MRSA Assay (FDA approved for NASAL specimens only), is one component of  a comprehensive MRSA colonization surveillance program. It is not intended to diagnose MRSA infection nor to guide or monitor treatment for MRSA infections. RESULT CALLED TO, READ BACK BY AND VERIFIED WITH: E CASTRO RN 07/21/17 2316 JDW Performed at [facility], [place] [address] [place], [place] [address]     Radiology Reports US Renal  Result Date: 07/22/2017 CLINICAL DATA:  Acute kidney injury EXAM: RENAL / URINARY TRACT ULTRASOUND COMPLETE COMPARISON:  06/22/2017 FINDINGS: Right Kidney: Length: 10.1 cm. Difficult to visualize. No visible hydronephrosis. Normal echotexture. Left Kidney: Length: 9.8 cm. Echogenicity within normal limits. No mass or hydronephrosis visualized. Bladder: Appears normal for degree of  bladder distention. IMPRESSION: No acute findings.  No hydronephrosis. Electronically Signed   By: Rolm Baptise M.D.   On: 07/22/2017 11:06   Dg Chest Port 1 View  Result Date: 07/21/2017 CLINICAL DATA:  Shortness of breath EXAM: PORTABLE CHEST 1 VIEW COMPARISON:  06/20/2017 FINDINGS: Low lung volumes are present, causing crowding of the pulmonary vasculature. The patient is rotated to the right on today's radiograph, reducing diagnostic sensitivity and specificity. Suspected mild enlargement of the cardiopericardial silhouette vague prominence of the right hilum is likely attributable to the rightward rotation. IMPRESSION: 1. Low lung volumes are present, causing crowding of the pulmonary vasculature. 2. Mild enlargement of the cardiopericardial silhouette. 3. Reduced exam sensitivity due to rightward rotation. Electronically Signed   By: Van Clines M.D.   On: 07/21/2017 15:53    Lab Data:  CBC: Recent Labs  Lab 07/21/17 1524 07/21/17 1604 07/22/17 0119  WBC 6.0  --  6.2  NEUTROABS 4.3  --   --   HGB 8.0* 8.8* 7.9*  HCT 26.9* 26.0* 25.9*  MCV 97.8  --  96.3  PLT 179  --  924   Basic Metabolic Panel: Recent Labs  Lab 07/21/17 1524 07/21/17 1604  07/21/17 1847 07/22/17 0119  NA 136 134* 138 139  K 6.8* 6.7* 5.5* 6.1*  CL 101 102 102 102  CO2 26  --  26 26  GLUCOSE 78 74 49* 124*  BUN 57* 56* 56* 58*  CREATININE 4.70* 4.70* 4.67* 4.67*  CALCIUM 8.7*  --  8.7* 8.8*  MG 2.1  --   --   --   PHOS 6.8*  --   --   --    GFR: Estimated Creatinine Clearance: 13.1 mL/min (A) (by C-G formula based on SCr of 4.67 mg/dL (H)). Liver Function Tests: Recent Labs  Lab 07/21/17 1524  AST 65*  ALT 26  ALKPHOS 80  BILITOT 0.4  PROT 6.5  ALBUMIN 3.2*   Recent Labs  Lab 07/21/17 1524  LIPASE 25   No results for input(s): AMMONIA in the last 168 hours. Coagulation Profile: Recent Labs  Lab 07/21/17 1524  INR 1.24   Cardiac Enzymes: Recent Labs  Lab 07/22/17 0655  CKTOTAL 1,270*   BNP (last 3 results) No results for input(s): PROBNP in the last 8760 hours. HbA1C: No results for input(s): HGBA1C in the last 72 hours. CBG: Recent Labs  Lab 07/21/17 1842 07/21/17 1934 07/21/17 2016 07/22/17 1017  GLUCAP 39* 100* 82 149*   Lipid Profile: No results for input(s): CHOL, HDL, LDLCALC, TRIG, CHOLHDL, LDLDIRECT in the last 72 hours. Thyroid Function Tests: No results for input(s): TSH, T4TOTAL, FREET4, T3FREE, THYROIDAB in the last 72 hours. Anemia Panel: No results for input(s): VITAMINB12, FOLATE, FERRITIN, TIBC, IRON, RETICCTPCT in the last 72 hours. Urine analysis:    Component Value Date/Time   COLORURINE ORANGE (A) 01/23/2017 2240   APPEARANCEUR CLEAR 01/23/2017 2240   LABSPEC 1.025 01/23/2017 2240   PHURINE 5.0 01/23/2017 2240   GLUCOSEU >=500 (A) 01/23/2017 2240   HGBUR NEGATIVE 01/23/2017 2240   BILIRUBINUR NEGATIVE 01/23/2017 2240   BILIRUBINUR Negative 01/21/2017 [place] 01/23/2017 2240   PROTEINUR >=300 (A) 01/23/2017 2240   UROBILINOGEN 0.2 01/21/2017 1044   UROBILINOGEN 0.2 08/28/2014 1555   NITRITE POSITIVE (A) 01/23/2017 2240   LEUKOCYTESUR NEGATIVE 01/23/2017 2240      Nonnie Pickney M.D. [facility] 07/22/2017, 11:53 AM  Pager: [phone] Between 7am to 7pm - call Pager -  [phone]  After 7pm go to [email or web address] - password [email or web address]  Call night coverage person covering after 7pm

## 2017-07-23 DIAGNOSIS — D649 Anemia, unspecified: Secondary | ICD-10-CM

## 2017-07-23 LAB — ABO/RH: ABO/RH(D): O POS

## 2017-07-23 LAB — URINALYSIS, ROUTINE W REFLEX MICROSCOPIC
BILIRUBIN URINE: NEGATIVE
GLUCOSE, UA: NEGATIVE mg/dL
KETONES UR: NEGATIVE mg/dL
LEUKOCYTES UA: NEGATIVE
Nitrite: NEGATIVE
PH: 5 (ref 5.0–8.0)
Protein, ur: 100 mg/dL — AB
Specific Gravity, Urine: 1.016 (ref 1.005–1.030)

## 2017-07-23 LAB — TYPE AND SCREEN
ABO/RH(D): O POS
ANTIBODY SCREEN: NEGATIVE
Unit division: 0

## 2017-07-23 LAB — CBC
HCT: 32.3 % — ABNORMAL LOW (ref 36.0–46.0)
Hemoglobin: 9.9 g/dL — ABNORMAL LOW (ref 12.0–15.0)
MCH: 29.3 pg (ref 26.0–34.0)
MCHC: 30.7 g/dL (ref 30.0–36.0)
MCV: 95.6 fL (ref 78.0–100.0)
PLATELETS: 184 10*3/uL (ref 150–400)
RBC: 3.38 MIL/uL — ABNORMAL LOW (ref 3.87–5.11)
RDW: 17.2 % — AB (ref 11.5–15.5)
WBC: 6.4 10*3/uL (ref 4.0–10.5)

## 2017-07-23 LAB — BASIC METABOLIC PANEL
Anion gap: 13 (ref 5–15)
BUN: 54 mg/dL — AB (ref 8–23)
CO2: 21 mmol/L — ABNORMAL LOW (ref 22–32)
CREATININE: 3.79 mg/dL — AB (ref 0.44–1.00)
Calcium: 8.5 mg/dL — ABNORMAL LOW (ref 8.9–10.3)
Chloride: 109 mmol/L (ref 98–111)
GFR calc Af Amer: 13 mL/min — ABNORMAL LOW (ref >60)
GFR, EST NON AFRICAN AMERICAN: 11 mL/min — AB (ref >60)
GLUCOSE: 115 mg/dL — AB (ref 70–99)
Potassium: 5.8 mmol/L — ABNORMAL HIGH (ref 3.5–5.1)
SODIUM: 143 mmol/L (ref 135–145)

## 2017-07-23 LAB — BPAM RBC
Blood Product Expiration Date: 201908122359
ISSUE DATE / TIME: 201907191640
Unit Type and Rh: 5100

## 2017-07-23 LAB — GLUCOSE, CAPILLARY
GLUCOSE-CAPILLARY: 103 mg/dL — AB (ref 70–99)
Glucose-Capillary: 106 mg/dL — ABNORMAL HIGH (ref 70–99)
Glucose-Capillary: 130 mg/dL — ABNORMAL HIGH (ref 70–99)
Glucose-Capillary: 103 mg/dL — ABNORMAL HIGH (ref 70–99)
Glucose-Capillary: 103 mg/dL — ABNORMAL HIGH (ref 70–99)

## 2017-07-23 LAB — CK: Total CK: 814 U/L — ABNORMAL HIGH (ref 38–234)

## 2017-07-23 MED ORDER — TRAZODONE HCL 50 MG PO TABS
50.0000 mg | ORAL_TABLET | Freq: Every day | ORAL | Status: DC
  Administered 2017-07-23: 50 mg via ORAL
  Filled 2017-07-23: qty 1

## 2017-07-23 NOTE — Progress Notes (Signed)
Unable to get urine specimen. Patient confused and verbally abusive to staff. Will continue ask.

## 2017-07-23 NOTE — Progress Notes (Signed)
[facility]                                                                              Patient Demographics  [name], is a 72 y.o. female, DOB - 1945-09-30, JFH:[id number]  Admit date - 07/21/2017   Admitting Physician Guilford Shi, MD  Outpatient Primary MD for the patient is Briscoe Deutscher, DO  Outpatient specialists:   LOS - 2  days   Medical records reviewed and are as summarized below:    Chief Complaint  Patient presents with  . Abnormal Lab       Brief summary   Patient is a 72 year old female with IDDM, CAD, depression, anxiety, hiatal hernia/GERD, hypertension, hyperlipidemia, diastolic CHF, chronic hypoxic respiratory failure on home O2 2 L, seizure disorder who was admitted in January 9373 for complicated UTI, presented with acute mental status changes from [place] skilled nursing facility.  Patient was admitted in 06/2017 at [facility] for ACS and underwent cardiac cath.  Patient also noted to be on multiple sedatives at baseline and Xanax.  Patient was receiving Lasix and potassium supplementation at skilled nursing facility.  She was started on Lasix 20 mg daily on 7/12 however received additional dose of 40 mg on 7/8 and 7/10.  At the time of discharge from [facility], she was on Lasix as needed only.  Creatinine at the time of discharge from [facility] hospital was 1.1.   Assessment & Plan    Principal Problem:   Acute metabolic encephalopathy -Multifactorial likely due to acute kidney injury, dehydration, polypharmacy. -Continue to hold narcotics, restarted Xanax to avoid benzodiazepine withdrawals -UA negative for UTI.  CT head negative for acute CVA. -Creatinine improving, 3.79 today  -Ammonia level normal, folate normal, B12 normal - will place on trazodone at bedtime  Active Problems: Acute kidney injury with hyperkalemia, rhabdomyolysis Likely due to diuretics, dehydration, rule  out UTI -Renal ultrasound showed no acute findings or hydronephrosis -Creatinine 4.7, baseline 1.1 in 06/2017   Acute rhabdomyolysis -CK 1270, no report of any falls, CK improving 814 today -Continue to hold statins, continue IV fluid hydration    Essential hypertension -BP currently stable, continue Imdur, beta-blocker    Hypothyroidism -TSH 1.2, continue Synthroid    Coronary artery disease of native artery of native heart with stable angina pectoris (HCC) -Continue aspirin, Effient, beta-blocker -Hold statin due to rhabdomyolysis    Diabetes mellitus type II, uncontrolled with hypoglycemia -Hemoglobin A1c 7.9, CBGs stable today, continue sliding scale insulin   dyslipidemia -Hold statin due to rhabdo    Morbid obesity ([place]) -BMI 46.3, will need diet and weight control  Chronic anemia  -Anemia panel consistent with anemia of chronic disease, hemoglobin improved 9.9 after 1 unit packed RBCs on 7/19   Code Status: Partial DVT Prophylaxis:  Lovenox Family Communication: Discussed  imaging, lab results, management with daughter at the bedside   Disposition Plan:   Time Spent in minutes 25 minutes  Procedures:  Renal ultrasound  Consultants:   Nephrology  Antimicrobials:      Medications  Scheduled Meds: . ALPRAZolam  0.5 mg Oral  BID  . aspirin  325 mg Oral Daily  . Chlorhexidine Gluconate Cloth  6 each Topical Q0600  . enoxaparin (LOVENOX) injection  40 mg Subcutaneous Q24H  . insulin aspart  0-5 Units Subcutaneous QHS  . insulin aspart  0-9 Units Subcutaneous TID WC  . isosorbide mononitrate  30 mg Oral Daily  . levETIRAcetam  1,000 mg Oral BID  . mouth rinse  15 mL Mouth Rinse BID  . metoprolol tartrate  25 mg Oral Daily  . mupirocin ointment  1 application Nasal BID  . prasugrel  10 mg Oral Daily  . traZODone  50 mg Oral QHS   Continuous Infusions: . sodium chloride 100 mL/hr at 07/23/17 0728  . albuterol 10 mg/hr (07/21/17 1655)   PRN  Meds:.acetaminophen, albuterol, ondansetron, oxymetazoline, polyethylene glycol   Antibiotics   Anti-infectives (From admission, onward)   None        Subjective:   [name] was seen and examined today.  Still somewhat confused, did not sleep well last night, somewhat agitated.  No fevers or chills.  Unable to obtain review of system from the patient.  Daughter at bedside  Objective:   Vitals:   07/22/17 1708 07/22/17 2034 07/23/17 0500 07/23/17 1015  BP: 104/77 126/66 105/86 (!) 156/68  Pulse: (!) 56 77 62 63  Resp: (!) 22 20 19  (!) 22  Temp: 98.6 F (37 C) 98.4 F (36.9 C) 97.9 F (36.6 C) 98.2 F (36.8 C)  TempSrc: Oral Oral Oral Oral  SpO2: 100% 100% 99% 96%  Weight:      Height:        Intake/Output Summary (Last 24 hours) at 07/23/2017 1319 Last data filed at 07/23/2017 0600 Gross per 24 hour  Intake 1518.49 ml  Output 0 ml  Net 1518.49 ml     Wt Readings from Last 3 Encounters:  07/22/17 114.9 kg (253 lb 4.9 oz)  03/22/17 104.1 kg (229 lb 9.6 oz)  01/21/17 105.7 kg (233 lb)     Exam    General: Alert and oriented to self, somewhat agitated  Eyes:  HEENT:    Cardiovascular: S1 S2 auscultated, RRR, no pedal edema b/l  Respiratory: Clear to auscultation bilaterally, no wheezing, rales or rhonchi  Gastrointestinal: Soft, nontender, nondistended, + bowel sounds  Ext: no pedal edema bilaterally  Neuro: moving all 4 extremities  Musculoskeletal: No digital cyanosis, clubbing  Skin: No rashes  Psych: confused   Data Reviewed:  I have personally reviewed following labs and imaging studies  Micro Results Recent Results (from the past 240 hour(s))  MRSA PCR Screening     Status: Abnormal   Collection Time: 07/21/17  8:12 PM  Result Value Ref Range Status   MRSA by PCR POSITIVE (A) NEGATIVE Final    Comment:        The GeneXpert MRSA Assay (FDA approved for NASAL specimens only), is one component of a comprehensive MRSA  colonization surveillance program. It is not intended to diagnose MRSA infection nor to guide or monitor treatment for MRSA infections. RESULT CALLED TO, READ BACK BY AND VERIFIED WITH: E CASTRO RN 07/21/17 2316 JDW Performed at [facility], [place] [address] [place], [place] [address]     Radiology Reports Ct Head Wo Contrast  Result Date: 07/22/2017 CLINICAL DATA:  Altered level of consciousness, combative. History of hypertension, hyperlipidemia, seizure. EXAM: CT HEAD WITHOUT CONTRAST TECHNIQUE: Contiguous axial images were obtained from the base of the skull through the vertex without  intravenous contrast. COMPARISON:  CT HEAD August 29, 2014 FINDINGS: Large body habitus results in overall noisy image quality. Moderately motion degraded examination. BRAIN: No intraparenchymal hemorrhage, mass effect nor midline shift. The ventricles and sulci are normal for age. Patchy to confluent supratentorial white matter hypodensities. No acute large vascular territory infarcts. No abnormal extra-axial fluid collections. Basal cisterns are patent. VASCULAR: Moderate calcific atherosclerosis of the carotid siphons. SKULL: No skull fracture. No significant scalp soft tissue swelling. SINUSES/ORBITS: Confluent LEFT mastoid air cells with minimal soft tissue density. Mild paranasal sinus mucosal thickening. Hypoplastic frontal sinuses.The included ocular globes and orbital contents are non-suspicious. OTHER: Patient is edentulous. IMPRESSION: 1. Habitus and motion degraded examination. No acute intracranial process. 2. Moderate chronic small vessel ischemic changes. 3. Chronic coalescent LEFT mastoiditis. Electronically Signed   By: Elon Alas M.D.   On: 07/22/2017 14:00   US Renal  Result Date: 07/22/2017 CLINICAL DATA:  Acute kidney injury EXAM: RENAL / URINARY TRACT ULTRASOUND COMPLETE COMPARISON:  06/22/2017 FINDINGS: Right Kidney: Length: 10.1 cm. Difficult to visualize. No visible  hydronephrosis. Normal echotexture. Left Kidney: Length: 9.8 cm. Echogenicity within normal limits. No mass or hydronephrosis visualized. Bladder: Appears normal for degree of bladder distention. IMPRESSION: No acute findings.  No hydronephrosis. Electronically Signed   By: Rolm Baptise M.D.   On: 07/22/2017 11:06   Dg Chest Port 1 View  Result Date: 07/21/2017 CLINICAL DATA:  Shortness of breath EXAM: PORTABLE CHEST 1 VIEW COMPARISON:  06/20/2017 FINDINGS: Low lung volumes are present, causing crowding of the pulmonary vasculature. The patient is rotated to the right on today's radiograph, reducing diagnostic sensitivity and specificity. Suspected mild enlargement of the cardiopericardial silhouette vague prominence of the right hilum is likely attributable to the rightward rotation. IMPRESSION: 1. Low lung volumes are present, causing crowding of the pulmonary vasculature. 2. Mild enlargement of the cardiopericardial silhouette. 3. Reduced exam sensitivity due to rightward rotation. Electronically Signed   By: Van Clines M.D.   On: 07/21/2017 15:53    Lab Data:  CBC: Recent Labs  Lab 07/21/17 1524 07/21/17 1604 07/22/17 0119 07/23/17 0746  WBC 6.0  --  6.2 6.4  NEUTROABS 4.3  --   --   --   HGB 8.0* 8.8* 7.9* 9.9*  HCT 26.9* 26.0* 25.9* 32.3*  MCV 97.8  --  96.3 95.6  PLT 179  --  177 443   Basic Metabolic Panel: Recent Labs  Lab 07/21/17 1524 07/21/17 1604 07/21/17 1847 07/22/17 0119 07/23/17 0746  NA 136 134* 138 139 143  K 6.8* 6.7* 5.5* 6.1* 5.8*  CL 101 102 102 102 109  CO2 26  --  26 26 21*  GLUCOSE 78 74 49* 124* 115*  BUN 57* 56* 56* 58* 54*  CREATININE 4.70* 4.70* 4.67* 4.67* 3.79*  CALCIUM 8.7*  --  8.7* 8.8* 8.5*  MG 2.1  --   --   --   --   PHOS 6.8*  --   --   --   --    GFR: Estimated Creatinine Clearance: 16.1 mL/min (A) (by C-G formula based on SCr of 3.79 mg/dL (H)). Liver Function Tests: Recent Labs  Lab 07/21/17 1524  AST 65*  ALT 26    ALKPHOS 80  BILITOT 0.4  PROT 6.5  ALBUMIN 3.2*   Recent Labs  Lab 07/21/17 1524  LIPASE 25   Recent Labs  Lab 07/22/17 1406  AMMONIA 22   Coagulation Profile: Recent Labs  Lab 07/21/17 1524  INR 1.24   Cardiac Enzymes: Recent Labs  Lab 07/22/17 0655 07/23/17 0746  CKTOTAL 1,270* 814*   BNP (last 3 results) No results for input(s): PROBNP in the last 8760 hours. HbA1C: Recent Labs    07/22/17 1406  HGBA1C 7.9*   CBG: Recent Labs  Lab 07/22/17 1633 07/22/17 2040 07/23/17 0738 07/23/17 0748 07/23/17 1123  GLUCAP 90 121* 106* 103* 130*   Lipid Profile: No results for input(s): CHOL, HDL, LDLCALC, TRIG, CHOLHDL, LDLDIRECT in the last 72 hours. Thyroid Function Tests: Recent Labs    07/22/17 1423  TSH 1.279   Anemia Panel: Recent Labs    07/22/17 1406 07/22/17 1423  VITAMINB12  --  912  FOLATE 8.9  --   FERRITIN  --  43  TIBC  --  253  IRON  --  47  RETICCTPCT  --  1.6   Urine analysis:    Component Value Date/Time   COLORURINE YELLOW 07/23/2017 1142   APPEARANCEUR CLOUDY (A) 07/23/2017 1142   LABSPEC 1.016 07/23/2017 1142   PHURINE 5.0 07/23/2017 1142   GLUCOSEU NEGATIVE 07/23/2017 1142   HGBUR SMALL (A) 07/23/2017 1142   BILIRUBINUR NEGATIVE 07/23/2017 1142   BILIRUBINUR Negative 01/21/2017 1044   KETONESUR NEGATIVE 07/23/2017 1142   PROTEINUR 100 (A) 07/23/2017 1142   UROBILINOGEN 0.2 01/21/2017 1044   UROBILINOGEN 0.2 08/28/2014 1555   NITRITE NEGATIVE 07/23/2017 1142   LEUKOCYTESUR NEGATIVE 07/23/2017 1142     Tiyon Sanor M.D. [facility] 07/23/2017, 1:19 PM  Pager: [phone] Between 7am to 7pm - call Pager - [phone]  After 7pm go to [email or web address] - password [email or web address]  Call night coverage person covering after 7pm

## 2017-07-23 NOTE — Progress Notes (Signed)
[facility] ROUNDING NOTE   Subjective:   Interval History: Patient stated that she is experiencing notable bilateral abdominal pain directly below the diaphragm but is unable to further delineate the timing of symptoms onset, character or associated symptoms.  Objective:  Vital signs in last 24 hours:  Temp:  [97.6 F (36.4 C)-98.6 F (37 C)] 97.9 F (36.6 C) (07/20 0500) Pulse Rate:  [56-77] 62 (07/20 0500) Resp:  [18-22] 19 (07/20 0500) BP: (96-131)/(54-86) 105/86 (07/20 0500) SpO2:  [97 %-100 %] 99 % (07/20 0500) FiO2 (%):  [3 %] 3 % (07/19 1708) Weight:  [253 lb 4.9 oz (114.9 kg)] 253 lb 4.9 oz (114.9 kg) (07/19 1126)  Weight change:  Filed Weights   07/22/17 1126  Weight: 253 lb 4.9 oz (114.9 kg)    Intake/Output: I/O last 3 completed shifts: In: 3292.6 [P.O.:1020; I.V.:2272.6] Out: 0    Intake/Output this shift:  No intake/output data recorded.  Physical Exam: General: more alert today, oriented to self but stated that she is in her mobile home. CVS- RRR RS- CTA bilaterally but in the prone position as the patient would not rise ABD- BS present soft non-distended, nontender EXT- no edema bilaterally  Basic Metabolic Panel: Recent Labs  Lab 07/21/17 1524 07/21/17 1604 07/21/17 1847 07/22/17 0119 07/23/17 0746  NA 136 134* 138 139 143  K 6.8* 6.7* 5.5* 6.1* 5.8*  CL 101 102 102 102 109  CO2 26  --  26 26 21*  GLUCOSE 78 74 49* 124* 115*  BUN 57* 56* 56* 58* 54*  CREATININE 4.70* 4.70* 4.67* 4.67* 3.79*  CALCIUM 8.7*  --  8.7* 8.8* 8.5*  MG 2.1  --   --   --   --   PHOS 6.8*  --   --   --   --     Liver Function Tests: Recent Labs  Lab 07/21/17 1524  AST 65*  ALT 26  ALKPHOS 80  BILITOT 0.4  PROT 6.5  ALBUMIN 3.2*   Recent Labs  Lab 07/21/17 1524  LIPASE 25   Recent Labs  Lab 07/22/17 1406  AMMONIA 22    CBC: Recent Labs  Lab 07/21/17 1524 07/21/17 1604 07/22/17 0119 07/23/17 0746  WBC 6.0  --  6.2 6.4   NEUTROABS 4.3  --   --   --   HGB 8.0* 8.8* 7.9* 9.9*  HCT 26.9* 26.0* 25.9* 32.3*  MCV 97.8  --  96.3 95.6  PLT 179  --  177 184    Cardiac Enzymes: Recent Labs  Lab 07/22/17 0655 07/23/17 0746  CKTOTAL 1,270* 814*    BNP: Invalid input(s): POCBNP  CBG: Recent Labs  Lab 07/21/17 2016 07/22/17 1017 07/22/17 1201 07/22/17 1633 07/22/17 2040  GLUCAP 82 149* 159* 22 121*    Microbiology: Results for orders placed or performed during the hospital encounter of 07/21/17  MRSA PCR Screening     Status: Abnormal   Collection Time: 07/21/17  8:12 PM  Result Value Ref Range Status   MRSA by PCR POSITIVE (A) NEGATIVE Final    Comment:        The GeneXpert MRSA Assay (FDA approved for NASAL specimens only), is one component of a comprehensive MRSA colonization surveillance program. It is not intended to diagnose MRSA infection nor to guide or monitor treatment for MRSA infections. RESULT CALLED TO, READ BACK BY AND VERIFIED WITH: E CASTRO RN 07/21/17 2316 JDW Performed at [facility], [place]  [address] [place], [place] [address]     Coagulation Studies: Recent Labs    07/21/17 1524  LABPROT 15.5*  INR 1.24    Urinalysis: No results for input(s): COLORURINE, LABSPEC, PHURINE, GLUCOSEU, HGBUR, BILIRUBINUR, KETONESUR, PROTEINUR, UROBILINOGEN, NITRITE, LEUKOCYTESUR in the last 72 hours.  Invalid input(s): APPERANCEUR    Imaging: Ct Head Wo Contrast  Result Date: 07/22/2017 CLINICAL DATA:  Altered level of consciousness, combative. History of hypertension, hyperlipidemia, seizure. EXAM: CT HEAD WITHOUT CONTRAST TECHNIQUE: Contiguous axial images were obtained from the base of the skull through the vertex without intravenous contrast. COMPARISON:  CT HEAD August 29, 2014 FINDINGS: Large body habitus results in overall noisy image quality. Moderately motion degraded examination. BRAIN: No intraparenchymal hemorrhage, mass effect nor midline shift. The ventricles  and sulci are normal for age. Patchy to confluent supratentorial white matter hypodensities. No acute large vascular territory infarcts. No abnormal extra-axial fluid collections. Basal cisterns are patent. VASCULAR: Moderate calcific atherosclerosis of the carotid siphons. SKULL: No skull fracture. No significant scalp soft tissue swelling. SINUSES/ORBITS: Confluent LEFT mastoid air cells with minimal soft tissue density. Mild paranasal sinus mucosal thickening. Hypoplastic frontal sinuses.The included ocular globes and orbital contents are non-suspicious. OTHER: Patient is edentulous. IMPRESSION: 1. Habitus and motion degraded examination. No acute intracranial process. 2. Moderate chronic small vessel ischemic changes. 3. Chronic coalescent LEFT mastoiditis. Electronically Signed   By: Elon Alas M.D.   On: 07/22/2017 14:00   US Renal  Result Date: 07/22/2017 CLINICAL DATA:  Acute kidney injury EXAM: RENAL / URINARY TRACT ULTRASOUND COMPLETE COMPARISON:  06/22/2017 FINDINGS: Right Kidney: Length: 10.1 cm. Difficult to visualize. No visible hydronephrosis. Normal echotexture. Left Kidney: Length: 9.8 cm. Echogenicity within normal limits. No mass or hydronephrosis visualized. Bladder: Appears normal for degree of bladder distention. IMPRESSION: No acute findings.  No hydronephrosis. Electronically Signed   By: Rolm Baptise M.D.   On: 07/22/2017 11:06   Dg Chest Port 1 View  Result Date: 07/21/2017 CLINICAL DATA:  Shortness of breath EXAM: PORTABLE CHEST 1 VIEW COMPARISON:  06/20/2017 FINDINGS: Low lung volumes are present, causing crowding of the pulmonary vasculature. The patient is rotated to the right on today's radiograph, reducing diagnostic sensitivity and specificity. Suspected mild enlargement of the cardiopericardial silhouette vague prominence of the right hilum is likely attributable to the rightward rotation. IMPRESSION: 1. Low lung volumes are present, causing crowding of the pulmonary  vasculature. 2. Mild enlargement of the cardiopericardial silhouette. 3. Reduced exam sensitivity due to rightward rotation. Electronically Signed   By: Van Clines M.D.   On: 07/21/2017 15:53     Medications:   . sodium chloride 100 mL/hr at 07/23/17 0728  . albuterol 10 mg/hr (07/21/17 1655)   . ALPRAZolam  0.5 mg Oral BID  . aspirin  325 mg Oral Daily  . Chlorhexidine Gluconate Cloth  6 each Topical Q0600  . enoxaparin (LOVENOX) injection  40 mg Subcutaneous Q24H  . insulin aspart  0-5 Units Subcutaneous QHS  . insulin aspart  0-9 Units Subcutaneous TID WC  . isosorbide mononitrate  30 mg Oral Daily  . levETIRAcetam  1,000 mg Oral BID  . mouth rinse  15 mL Mouth Rinse BID  . metoprolol tartrate  25 mg Oral Daily  . mupirocin ointment  1 application Nasal BID  . prasugrel  10 mg Oral Daily  . traZODone  50 mg Oral QHS   acetaminophen, albuterol, ondansetron, oxymetazoline, polyethylene glycol  Assessment/ Plan:  The patient is a 72 y.o. year-old  female w/ a PMHx notable for insulin dependent DMII, CAD, MDD, MAD, esophageal stricture, hiatal hernia/GERD, HTN, HLD, diastolic CHF, chronic hypoxic respiratory failure on home O2 @ 2L via [place] and a seizure disorder last admitted in Janurary of 7579 for a complicated UTI, and presented for this hospitalization from [facility] facility for progressively worsening encephalopathy and increased creatinine. Upon admission she was noted to have a SCr of 4.7, potassium 6.8, Hgb 7.9 (down from 10.4 5 months prior). Scr improved since admission, Hgb stable.   1.Renal-Likely Acute renal injury 2/2 to contrast toxicity and hypotension. Most likely secondary to the contrast dye load with her prior cardiac cath but unable to definitively determine at this stage. There is no acute indication for dialysis at this stage but we will monitor her renal function and electrolytes closely for changes and hope that her mental status improves as we may  likely need to discuss potential dialysis.              -Continue strict I/O's             -Continue Daily weights             -Daily BMP's to monitor renal function  -Hold antihypertensives and nephrotoxic agents 2. Hypertension/volume  -  BP is soft. Will monitor. No documented urinary output still. UA ordered. Daily weights ordered, not recorded.  3. Hyperkalemia  - Most likely secondary to oral potassium supplementation, ARB and renal excretion impairment. Improved today to 5.8.              -May need to repeat Kayexalate until her renal function improves 4. Anemia  - Microcytic anemia of 9.9. No signs of acute blood loss.  5. Acute encephalopathy  - Unclear etiology. Differential includes micro atherosclerotic embolic CVA from the recent cardiac cath, dementia, metabolic disorder but no clear indication that her renal dysfunction is directly related. CT brain without clear evidence of CVA.    LOS: 2 Kathi Ludwig, MD PGY-2, Internal Medicine Resident

## 2017-07-24 ENCOUNTER — Inpatient Hospital Stay (HOSPITAL_COMMUNITY): Payer: Medicare Other

## 2017-07-24 DIAGNOSIS — I10 Essential (primary) hypertension: Secondary | ICD-10-CM

## 2017-07-24 DIAGNOSIS — R0600 Dyspnea, unspecified: Secondary | ICD-10-CM

## 2017-07-24 DIAGNOSIS — G9341 Metabolic encephalopathy: Secondary | ICD-10-CM

## 2017-07-24 LAB — CBC
HEMATOCRIT: 30.4 % — AB (ref 36.0–46.0)
Hemoglobin: 9.1 g/dL — ABNORMAL LOW (ref 12.0–15.0)
MCH: 29 pg (ref 26.0–34.0)
MCHC: 29.9 g/dL — ABNORMAL LOW (ref 30.0–36.0)
MCV: 96.8 fL (ref 78.0–100.0)
PLATELETS: 180 10*3/uL (ref 150–400)
RBC: 3.14 MIL/uL — AB (ref 3.87–5.11)
RDW: 17 % — AB (ref 11.5–15.5)
WBC: 6.4 10*3/uL (ref 4.0–10.5)

## 2017-07-24 LAB — BASIC METABOLIC PANEL
Anion gap: 9 (ref 5–15)
BUN: 43 mg/dL — ABNORMAL HIGH (ref 8–23)
CALCIUM: 8.4 mg/dL — AB (ref 8.9–10.3)
CO2: 24 mmol/L (ref 22–32)
CREATININE: 2.66 mg/dL — AB (ref 0.44–1.00)
Chloride: 112 mmol/L — ABNORMAL HIGH (ref 98–111)
GFR, EST AFRICAN AMERICAN: 20 mL/min — AB (ref >60)
GFR, EST NON AFRICAN AMERICAN: 17 mL/min — AB (ref >60)
Glucose, Bld: 99 mg/dL (ref 70–99)
Potassium: 4.6 mmol/L (ref 3.5–5.1)
SODIUM: 145 mmol/L (ref 135–145)

## 2017-07-24 LAB — GLUCOSE, CAPILLARY
Glucose-Capillary: 98 mg/dL (ref 70–99)
Glucose-Capillary: 85 mg/dL (ref 70–99)
Glucose-Capillary: 92 mg/dL (ref 70–99)
Glucose-Capillary: 105 mg/dL — ABNORMAL HIGH (ref 70–99)

## 2017-07-24 LAB — CK: Total CK: 465 U/L — ABNORMAL HIGH (ref 38–234)

## 2017-07-24 LAB — BRAIN NATRIURETIC PEPTIDE: B Natriuretic Peptide: 2128.9 pg/mL — ABNORMAL HIGH (ref 0.0–100.0)

## 2017-07-24 MED ORDER — FUROSEMIDE 10 MG/ML IJ SOLN
80.0000 mg | Freq: Once | INTRAMUSCULAR | Status: AC
  Administered 2017-07-24: 80 mg via INTRAVENOUS
  Filled 2017-07-24: qty 8

## 2017-07-24 MED ORDER — FUROSEMIDE 10 MG/ML IJ SOLN
20.0000 mg | Freq: Once | INTRAMUSCULAR | Status: AC
  Administered 2017-07-24: 20 mg via INTRAVENOUS
  Filled 2017-07-24: qty 2

## 2017-07-24 NOTE — Progress Notes (Signed)
[facility] ROUNDING NOTE   Subjective:   Interval History: Patient sleeping when seen and refused to open eyes. Not feeling well this morning, short of breath.   Objective:  Vital signs in last 24 hours:  Temp:  [97.6 F (36.4 C)-98 F (36.7 C)] 98 F (36.7 C) (07/21 0928) Pulse Rate:  [58-138] 138 (07/21 0928) Resp:  [14-22] 15 (07/21 0928) BP: (134-160)/(64-116) 134/102 (07/21 0928) SpO2:  [98 %-100 %] 98 % (07/21 0928)  Weight change:  Filed Weights   07/22/17 1126  Weight: 253 lb 4.9 oz (114.9 kg)    Intake/Output: I/O last 3 completed shifts: In: 4486.2 [P.O.:780; I.V.:3706.2] Out: 1000 [Urine:1000]   Intake/Output this shift:  Total I/O In: 30 [P.O.:30] Out: 2250 [Urine:2250]  General: chronically-ill appearing elderly female, sleeping in bed in no acute distress  CVS- RRR, no mrg  RS- CTA anteriorly, does have bilateral crackles at the bases  ABD- BS present soft non-distended EXT- 1+ edema bilaterally    Basic Metabolic Panel: Recent Labs  Lab 07/21/17 1524 07/21/17 1604 07/21/17 1847 07/22/17 0119 07/23/17 0746 07/24/17 0639  NA 136 134* 138 139 143 145  K 6.8* 6.7* 5.5* 6.1* 5.8* 4.6  CL 101 102 102 102 109 112*  CO2 26  --  26 26 21* 24  GLUCOSE 78 74 49* 124* 115* 99  BUN 57* 56* 56* 58* 54* 43*  CREATININE 4.70* 4.70* 4.67* 4.67* 3.79* 2.66*  CALCIUM 8.7*  --  8.7* 8.8* 8.5* 8.4*  MG 2.1  --   --   --   --   --   PHOS 6.8*  --   --   --   --   --     Liver Function Tests: Recent Labs  Lab 07/21/17 1524  AST 65*  ALT 26  ALKPHOS 80  BILITOT 0.4  PROT 6.5  ALBUMIN 3.2*   Recent Labs  Lab 07/21/17 1524  LIPASE 25   Recent Labs  Lab 07/22/17 1406  AMMONIA 22    CBC: Recent Labs  Lab 07/21/17 1524 07/21/17 1604 07/22/17 0119 07/23/17 0746 07/24/17 0639  WBC 6.0  --  6.2 6.4 6.4  NEUTROABS 4.3  --   --   --   --   HGB 8.0* 8.8* 7.9* 9.9* 9.1*  HCT 26.9* 26.0* 25.9* 32.3* 30.4*  MCV 97.8  --  96.3  95.6 96.8  PLT 179  --  177 184 180    Cardiac Enzymes: Recent Labs  Lab 07/22/17 0655 07/23/17 0746 07/24/17 0639  CKTOTAL 1,270* 814* 465*    BNP: Invalid input(s): POCBNP  CBG: Recent Labs  Lab 07/23/17 1123 07/23/17 1716 07/23/17 2059 07/24/17 0730 07/24/17 1158  GLUCAP 130* 103* 103* 98 85    Microbiology: Results for orders placed or performed during the hospital encounter of 07/21/17  MRSA PCR Screening     Status: Abnormal   Collection Time: 07/21/17  8:12 PM  Result Value Ref Range Status   MRSA by PCR POSITIVE (A) NEGATIVE Final    Comment:        The GeneXpert MRSA Assay (FDA approved for NASAL specimens only), is one component of a comprehensive MRSA colonization surveillance program. It is not intended to diagnose MRSA infection nor to guide or monitor treatment for MRSA infections. RESULT CALLED TO, READ BACK BY AND VERIFIED WITH: E CASTRO RN 07/21/17 2316 JDW Performed at [facility], [place] [address], [place], [place] [address]   Urine  Culture     Status: Abnormal (Preliminary result)   Collection Time: 07/22/17  6:39 AM  Result Value Ref Range Status   Specimen Description URINE, RANDOM  Final   Special Requests   Final    NONE Performed at [facility], [place] [address], [place], [place] [address]    Culture >=100,000 COLONIES/mL KLEBSIELLA PNEUMONIAE (A)  Final   Report Status PENDING  Incomplete    Coagulation Studies: No results for input(s): LABPROT, INR in the last 72 hours.  Urinalysis: Recent Labs    07/23/17 1142  COLORURINE YELLOW  LABSPEC 1.016  PHURINE 5.0  GLUCOSEU NEGATIVE  HGBUR SMALL*  BILIRUBINUR NEGATIVE  KETONESUR NEGATIVE  PROTEINUR 100*  NITRITE NEGATIVE  LEUKOCYTESUR NEGATIVE      Imaging: Dg Chest Port 1 View  Result Date: 07/24/2017 CLINICAL DATA:  Dyspnea EXAM: PORTABLE CHEST 1 VIEW COMPARISON:  07/21/2017 FINDINGS: Moderate cardiomegaly with diffuse vascular congestion and interstitial  prominence compatible with interstitial edema. Mild CHF suspected. No large effusion or pneumothorax. No focal pneumonia, collapse or consolidation. Trachea is midline. IMPRESSION: Mild CHF pattern Electronically Signed   By: Jerilynn Mages.  Shick M.D.   On: 07/24/2017 08:22     Medications:   . albuterol 10 mg/hr (07/21/17 1655)   . ALPRAZolam  0.5 mg Oral BID  . aspirin  325 mg Oral Daily  . Chlorhexidine Gluconate Cloth  6 each Topical Q0600  . enoxaparin (LOVENOX) injection  40 mg Subcutaneous Q24H  . insulin aspart  0-5 Units Subcutaneous QHS  . insulin aspart  0-9 Units Subcutaneous TID WC  . isosorbide mononitrate  30 mg Oral Daily  . levETIRAcetam  1,000 mg Oral BID  . mouth rinse  15 mL Mouth Rinse BID  . metoprolol tartrate  25 mg Oral Daily  . mupirocin ointment  1 application Nasal BID  . prasugrel  10 mg Oral Daily   acetaminophen, albuterol, ondansetron, oxymetazoline, polyethylene glycol  Assessment/ Plan:  72 y.o.year-old female w/ a PMHx insulin dependent DMII, CAD, MDD, MAD, esophageal stricture, hiatal hernia/GERD, HTN, HLD, diastolic CHF, chronic hypoxic respiratory failure on home O2 @ 2L via [place] and a seizure disorder last admitted in Janurary of 0017 for a complicated UTI, and presented for this hospitalization from [facility] forprogressively worseningencephalopathy and increased creatinine. Upon admission she was noted to have a SCr of 4.7, potassium 6.8, Hgb 7.9 (down from 10.4 5 months prior). Scr improved since admission, Hgb stable.   1.Renal-Likely Acute renal injury 2/2 to contrast toxicity and hypotension. Renal function continues to improve, Cr 2.66 from 3.79 yesterday. UOP 1L yesterday. She does appear hypervolemic on exam and is reporting new shortness of breath. IV Lasix 20 mg x1 has been ordered. Ordered an additional dose of 80mg  x1 as she is net positive 5L.  -Continue strict I/O's -Continue Daily  weights -Daily BMP's to monitor renal function             -Hold antihypertensives and nephrotoxic agents 2. Hypertension/volume - normotensive . Hypervolemic. S/p I vLAsix 20 mg x1. Ordered an additional dose of 80mg .  3. Hyperkalemia - Most likely secondary to oral potassium supplementation, ARB and renal excretion impairment. continues to improve, 4.6.  4. Anemia -Microcytic anemia of 9.9. No signs of acute blood loss.  5. Acute encephalopathy - Unclear etiology. CT brain without clear evidence of CVA. neurology consulted who believe it is likely acute metabolic encephalopathy, MRI brain to r/o stroke and EEG.  LOS: 3 [name] 07/24/2017 3:44 PM

## 2017-07-24 NOTE — Progress Notes (Signed)
[facility]                                                                              Patient Demographics  [name], is a 72 y.o. female, DOB - 1945-02-11, EHU:[id number]  Admit date - 07/21/2017   Admitting Physician Guilford Shi, MD  Outpatient Primary MD for the patient is Briscoe Deutscher, DO  Outpatient specialists:   LOS - 3  days   Medical records reviewed and are as summarized below:    Chief Complaint  Patient presents with  . Abnormal Lab       Brief summary   Patient is a 72 year old female with IDDM, CAD, depression, anxiety, hiatal hernia/GERD, hypertension, hyperlipidemia, diastolic CHF, chronic hypoxic respiratory failure on home O2 2 L, seizure disorder who was admitted in January 7858 for complicated UTI, presented with acute mental status changes from [place] skilled nursing facility.  Patient was admitted in 06/2017 at [facility] for ACS and underwent cardiac cath.  Patient also noted to be on multiple sedatives at baseline and Xanax.  Patient was receiving Lasix and potassium supplementation at skilled nursing facility.  She was started on Lasix 20 mg daily on 7/12 however received additional dose of 40 mg on 7/8 and 7/10.  At the time of discharge from [facility], she was on Lasix as needed only.  Creatinine at the time of discharge from [facility] hospital was 1.1.   Assessment & Plan    Principal Problem:   Acute metabolic encephalopathy - Multifactorial likely due to acute kidney injury, dehydration, polypharmacy. - Continue to hold narcotics, restarted Xanax to avoid benzodiazepine withdrawals - UA negative for UTI.  CT head negative for acute CVA. - Ammonia level normal, folate normal, B12 normal - Still very confused, d/w Dr Rory Percy (neurology), will evaluate and recommended MRI of the brain with EEG.   Active Problems: Acute kidney injury with hyperkalemia, rhabdomyolysis -  Likely due to diuretics, dehydration.  UA negative for UTI -Renal ultrasound showed no acute findings or hydronephrosis -Creatinine 4.7, baseline 1.1 in 06/2017   acute on chronic diastolic CHF due to volume overload, with wheezing and shortness of breath -DC IV fluids, KVO, wheezing at the time of my examination -Chest x-ray showed diffuse vascular congestion, with BNP 2128.9, I's and O's with net positive of 5.6 L -Gave Lasix 20 mg IV x1, repeated again by nephrology  Acute rhabdomyolysis -CK 1270, no report of any falls -CK improving, continue to hold statin, DC IV fluids due to volume overload    Essential hypertension -BP currently stable, continue Imdur, beta-blocker    Hypothyroidism -TSH 1.2, continue Synthroid    Coronary artery disease of native artery of native heart with stable angina pectoris (HCC) -Continue aspirin, Effient, beta-blocker -Hold statin due to rhabdomyolysis    Diabetes mellitus type II, uncontrolled with hypoglycemia -CBG stable, continue sliding scale insulin.  Hemoglobin A1c 7.9    dyslipidemia -Hold statin due to rhabdo    Morbid obesity (HCC) -BMI 46.3, will need diet and weight control  Chronic anemia  -Anemia panel consistent with anemia of chronic disease,  hemoglobin improved 9.9 after 1 unit packed RBCs on 7/19   Code Status: Partial DVT Prophylaxis:  Lovenox Family Communication: Discussed management in detail with daughter on 7/20, no family member at the bedside today   Disposition Plan:   Time Spent in minutes 25 minutes  Procedures:  Renal ultrasound  Consultants:   Nephrology  Neurology  Antimicrobials:      Medications  Scheduled Meds: . ALPRAZolam  0.5 mg Oral BID  . aspirin  325 mg Oral Daily  . Chlorhexidine Gluconate Cloth  6 each Topical Q0600  . enoxaparin (LOVENOX) injection  40 mg Subcutaneous Q24H  . insulin aspart  0-5 Units Subcutaneous QHS  . insulin aspart  0-9 Units Subcutaneous TID WC  .  isosorbide mononitrate  30 mg Oral Daily  . levETIRAcetam  1,000 mg Oral BID  . mouth rinse  15 mL Mouth Rinse BID  . metoprolol tartrate  25 mg Oral Daily  . mupirocin ointment  1 application Nasal BID  . prasugrel  10 mg Oral Daily   Continuous Infusions: . albuterol 10 mg/hr (07/21/17 1655)   PRN Meds:.acetaminophen, albuterol, ondansetron, oxymetazoline, polyethylene glycol   Antibiotics   Anti-infectives (From admission, onward)   None        Subjective:   Raliyah Montella was seen and examined today.  Still confused, unable to obtain review of system from the patient however wheezing and short of breath.  Oriented to self.    Objective:   Vitals:   07/23/17 2059 07/24/17 0513 07/24/17 0927 07/24/17 0928  BP: (!) 160/86 (!) 159/89 (!) 144/116 (!) 134/102  Pulse: 62 73 80 (!) 138  Resp: (!) 22 20 14    Temp: 97.8 F (36.6 C) 97.6 F (36.4 C)    TempSrc: Oral Oral    SpO2: 100% 99% 99%   Weight:      Height:        Intake/Output Summary (Last 24 hours) at 07/24/2017 1016 Last data filed at 07/24/2017 0602 Gross per 24 hour  Intake 2907.7 ml  Output 1000 ml  Net 1907.7 ml     Wt Readings from Last 3 Encounters:  07/22/17 114.9 kg (253 lb 4.9 oz)  03/22/17 104.1 kg (229 lb 9.6 oz)  01/21/17 105.7 kg (233 lb)     Exam   General: Alert and oriented x self, short of breath and wheezing  Eyes:   HEENT:  Atraumatic, normocephalic, normal oropharynx  Cardiovascular: S1 S2 auscultated, Regular rate and rhythm.  1+ pitting edema  Respiratory: Diffuse wheezing bilaterally with bibasilar Rales  Gastrointestinal: Soft, nontender, nondistended, + bowel sounds  Ext: 1+ pedal edema bilaterally  Neuro: moving all 4 extremities spontaneously, does not follow directions  Musculoskeletal: No digital cyanosis, clubbing  Skin: No rashes  Psych: confused  Data Reviewed:  I have personally reviewed following labs and imaging studies  Micro Results Recent  Results (from the past 240 hour(s))  MRSA PCR Screening     Status: Abnormal   Collection Time: 07/21/17  8:12 PM  Result Value Ref Range Status   MRSA by PCR POSITIVE (A) NEGATIVE Final    Comment:        The GeneXpert MRSA Assay (FDA approved for NASAL specimens only), is one component of a comprehensive MRSA colonization surveillance program. It is not intended to diagnose MRSA infection nor to guide or monitor treatment for MRSA infections. RESULT CALLED TO, READ BACK BY AND VERIFIED WITH: E CASTRO RN 07/21/17 2316 JDW Performed at  [facility], [place] [address], [place], [place] [address]   Urine Culture     Status: Abnormal (Preliminary result)   Collection Time: 07/22/17  6:39 AM  Result Value Ref Range Status   Specimen Description URINE, RANDOM  Final   Special Requests   Final    NONE Performed at [facility], [place] [address], [place], [place] [address]    Culture >=100,000 COLONIES/mL GRAM NEGATIVE RODS (A)  Final   Report Status PENDING  Incomplete    Radiology Reports Ct Head Wo Contrast  Result Date: 07/22/2017 CLINICAL DATA:  Altered level of consciousness, combative. History of hypertension, hyperlipidemia, seizure. EXAM: CT HEAD WITHOUT CONTRAST TECHNIQUE: Contiguous axial images were obtained from the base of the skull through the vertex without intravenous contrast. COMPARISON:  CT HEAD August 29, 2014 FINDINGS: Large body habitus results in overall noisy image quality. Moderately motion degraded examination. BRAIN: No intraparenchymal hemorrhage, mass effect nor midline shift. The ventricles and sulci are normal for age. Patchy to confluent supratentorial white matter hypodensities. No acute large vascular territory infarcts. No abnormal extra-axial fluid collections. Basal cisterns are patent. VASCULAR: Moderate calcific atherosclerosis of the carotid siphons. SKULL: No skull fracture. No significant scalp soft tissue swelling. SINUSES/ORBITS: Confluent  LEFT mastoid air cells with minimal soft tissue density. Mild paranasal sinus mucosal thickening. Hypoplastic frontal sinuses.The included ocular globes and orbital contents are non-suspicious. OTHER: Patient is edentulous. IMPRESSION: 1. Habitus and motion degraded examination. No acute intracranial process. 2. Moderate chronic small vessel ischemic changes. 3. Chronic coalescent LEFT mastoiditis. Electronically Signed   By: Elon Alas M.D.   On: 07/22/2017 14:00   US Renal  Result Date: 07/22/2017 CLINICAL DATA:  Acute kidney injury EXAM: RENAL / URINARY TRACT ULTRASOUND COMPLETE COMPARISON:  06/22/2017 FINDINGS: Right Kidney: Length: 10.1 cm. Difficult to visualize. No visible hydronephrosis. Normal echotexture. Left Kidney: Length: 9.8 cm. Echogenicity within normal limits. No mass or hydronephrosis visualized. Bladder: Appears normal for degree of bladder distention. IMPRESSION: No acute findings.  No hydronephrosis. Electronically Signed   By: Rolm Baptise M.D.   On: 07/22/2017 11:06   Dg Chest Port 1 View  Result Date: 07/24/2017 CLINICAL DATA:  Dyspnea EXAM: PORTABLE CHEST 1 VIEW COMPARISON:  07/21/2017 FINDINGS: Moderate cardiomegaly with diffuse vascular congestion and interstitial prominence compatible with interstitial edema. Mild CHF suspected. No large effusion or pneumothorax. No focal pneumonia, collapse or consolidation. Trachea is midline. IMPRESSION: Mild CHF pattern Electronically Signed   By: Jerilynn Mages.  Shick M.D.   On: 07/24/2017 08:22   Dg Chest Port 1 View  Result Date: 07/21/2017 CLINICAL DATA:  Shortness of breath EXAM: PORTABLE CHEST 1 VIEW COMPARISON:  06/20/2017 FINDINGS: Low lung volumes are present, causing crowding of the pulmonary vasculature. The patient is rotated to the right on today's radiograph, reducing diagnostic sensitivity and specificity. Suspected mild enlargement of the cardiopericardial silhouette vague prominence of the right hilum is likely attributable to  the rightward rotation. IMPRESSION: 1. Low lung volumes are present, causing crowding of the pulmonary vasculature. 2. Mild enlargement of the cardiopericardial silhouette. 3. Reduced exam sensitivity due to rightward rotation. Electronically Signed   By: Van Clines M.D.   On: 07/21/2017 15:53    Lab Data:  CBC: Recent Labs  Lab 07/21/17 1524 07/21/17 1604 07/22/17 0119 07/23/17 0746 07/24/17 0639  WBC 6.0  --  6.2 6.4 6.4  NEUTROABS 4.3  --   --   --   --   HGB 8.0*  8.8* 7.9* 9.9* 9.1*  HCT 26.9* 26.0* 25.9* 32.3* 30.4*  MCV 97.8  --  96.3 95.6 96.8  PLT 179  --  177 184 716   Basic Metabolic Panel: Recent Labs  Lab 07/21/17 1524 07/21/17 1604 07/21/17 1847 07/22/17 0119 07/23/17 0746 07/24/17 0639  NA 136 134* 138 139 143 145  K 6.8* 6.7* 5.5* 6.1* 5.8* 4.6  CL 101 102 102 102 109 112*  CO2 26  --  26 26 21* 24  GLUCOSE 78 74 49* 124* 115* 99  BUN 57* 56* 56* 58* 54* 43*  CREATININE 4.70* 4.70* 4.67* 4.67* 3.79* 2.66*  CALCIUM 8.7*  --  8.7* 8.8* 8.5* 8.4*  MG 2.1  --   --   --   --   --   PHOS 6.8*  --   --   --   --   --    GFR: Estimated Creatinine Clearance: 22.9 mL/min (A) (by C-G formula based on SCr of 2.66 mg/dL (H)). Liver Function Tests: Recent Labs  Lab 07/21/17 1524  AST 65*  ALT 26  ALKPHOS 80  BILITOT 0.4  PROT 6.5  ALBUMIN 3.2*   Recent Labs  Lab 07/21/17 1524  LIPASE 25   Recent Labs  Lab 07/22/17 1406  AMMONIA 22   Coagulation Profile: Recent Labs  Lab 07/21/17 1524  INR 1.24   Cardiac Enzymes: Recent Labs  Lab 07/22/17 0655 07/23/17 0746 07/24/17 0639  CKTOTAL 1,270* 814* 465*   BNP (last 3 results) No results for input(s): PROBNP in the last 8760 hours. HbA1C: Recent Labs    07/22/17 1406  HGBA1C 7.9*   CBG: Recent Labs  Lab 07/23/17 0748 07/23/17 1123 07/23/17 1716 07/23/17 2059 07/24/17 0730  GLUCAP 103* 130* 103* 103* 98   Lipid Profile: No results for input(s): CHOL, HDL, LDLCALC, TRIG,  CHOLHDL, LDLDIRECT in the last 72 hours. Thyroid Function Tests: Recent Labs    07/22/17 1423  TSH 1.279   Anemia Panel: Recent Labs    07/22/17 1406 07/22/17 1423  VITAMINB12  --  912  FOLATE 8.9  --   FERRITIN  --  43  TIBC  --  253  IRON  --  47  RETICCTPCT  --  1.6   Urine analysis:    Component Value Date/Time   COLORURINE YELLOW 07/23/2017 1142   APPEARANCEUR CLOUDY (A) 07/23/2017 1142   LABSPEC 1.016 07/23/2017 1142   PHURINE 5.0 07/23/2017 1142   GLUCOSEU NEGATIVE 07/23/2017 1142   HGBUR SMALL (A) 07/23/2017 1142   BILIRUBINUR NEGATIVE 07/23/2017 1142   BILIRUBINUR Negative 01/21/2017 1044   KETONESUR NEGATIVE 07/23/2017 1142   PROTEINUR 100 (A) 07/23/2017 1142   UROBILINOGEN 0.2 01/21/2017 1044   UROBILINOGEN 0.2 08/28/2014 1555   NITRITE NEGATIVE 07/23/2017 1142   LEUKOCYTESUR NEGATIVE 07/23/2017 1142     Ripudeep Rai M.D. [facility] 07/24/2017, 10:16 AM  Pager: [phone] Between 7am to 7pm - call Pager - [phone]  After 7pm go to [email or web address] - password [email or web address]  Call night coverage person covering after 7pm

## 2017-07-24 NOTE — Consult Note (Signed)
Neurology Consultation  Reason for Consult: Altered mental status Referring Physician: Dr Tana Coast  CC: Altered mental status  History is obtained from: Chart, patient, patient's hospitalist  HPI: [name] is a 72 y.o. female with a past medical history of diabetes, coronary disease, anxiety, depression, congestive heart failure diastolic, chronic hypoxic respiratory failure on home oxygen, seizure disorder for which she has been on Keppra for many years, who was admitted for ACS and had cardiac catheterization done.  She was noted to be on multiple sedatives at baseline including Xanax.  Few days after the procedure, she still continues to be confused per primary team examination and thus far neurological consultation was placed given her history of seizures and multiple risk factors for strokes. Patient reports not knowing where she is.  She is able to tell me her name and age. She is able to hold full conversation with me but has poor attention concentration. She reports continued shortness of breath.  Denies any chest pain.  Denies any nausea and vomiting.  She denies any visual disturbances.  Denies headaches.  Denies any tingling or numbness. Reports some pain in her right upper extremity at the shoulder. Denies any easy bleeding or bruising tendencies.   LKW: Many days ago tpa given?: no, no clear last known normal Premorbid modified Rankin scale (mRS): 3-4  ROS: ROS was performed and is negative except as noted in the HPI.  Past Medical History:  Diagnosis Date  . Anxiety   . Coronary artery disease   . Depression   . Diabetes mellitus    insulin dependent  . Edema of lower extremity   . Esophageal stricture 2008   EGD  . Fatigue   . GERD (gastroesophageal reflux disease)   . Heart murmur   . Hiatal hernia 2008   EGD  . Hyperlipidemia   . Hypertension   . Knee pain   . Morbid obesity ([place])   . Nonproductive cough    chronic  . Orthopnea   . Seizure disorder ([place])    . SOB (shortness of breath)   . Syncope and collapse   . Tremor     Family History  Problem Relation Age of Onset  . Heart attack Mother   . Hypertension Mother   . COPD Father   . Heart disease Sister   . Diabetes Brother   . Diabetes Brother   . Prostate cancer Brother   . Kidney disease Brother     Social History:   reports that she quit smoking about 47 years ago. She has never used smokeless tobacco. She reports that she does not drink alcohol or use drugs.  Medications  Current Facility-Administered Medications:  .  acetaminophen (TYLENOL) tablet 650 mg, 650 mg, Oral, Q4H PRN, Kamineni, Neelima, MD .  albuterol (PROVENTIL) (2.5 MG/3ML) 0.083% nebulizer solution 2.5 mg, 2.5 mg, Nebulization, Q4H PRN, Kamineni, Neelima, MD .  albuterol (PROVENTIL,VENTOLIN) solution continuous neb, 10 mg/hr, Nebulization, Continuous, Pfeiffer, Marcy, MD, Last Rate: 2 mL/hr at 07/21/17 1655, 10 mg/hr at 07/21/17 1655 .  ALPRAZolam Duanne Moron) tablet 0.5 mg, 0.5 mg, Oral, BID, Rai, Ripudeep K, MD, 0.5 mg at 07/23/17 2122 .  aspirin EC tablet 325 mg, 325 mg, Oral, Daily, Kamineni, Neelima, MD, 325 mg at 07/24/17 0950 .  Chlorhexidine Gluconate Cloth 2 % PADS 6 each, 6 each, Topical, Q0600, Guilford Shi, MD, 6 each at 07/24/17 0601 .  enoxaparin (LOVENOX) injection 40 mg, 40 mg, Subcutaneous, Q24H, Kamineni, Neelima, MD, 40 mg at  07/23/17 2122 .  furosemide (LASIX) injection 80 mg, 80 mg, Intravenous, Once, Santos-Sanchez, Idalys, MD .  insulin aspart (novoLOG) injection 0-5 Units, 0-5 Units, Subcutaneous, QHS, Rai, Ripudeep K, MD .  insulin aspart (novoLOG) injection 0-9 Units, 0-9 Units, Subcutaneous, TID WC, Rai, Ripudeep K, MD, 1 Units at 07/23/17 1232 .  isosorbide mononitrate (IMDUR) 24 hr tablet 30 mg, 30 mg, Oral, Daily, Kamineni, Neelima, MD, 30 mg at 07/24/17 0950 .  levETIRAcetam (KEPPRA) tablet 1,000 mg, 1,000 mg, Oral, BID, Guilford Shi, MD, 1,000 mg at 07/23/17 2122 .  MEDLINE  mouth rinse, 15 mL, Mouth Rinse, BID, Kamineni, Neelima, MD, 15 mL at 07/24/17 0954 .  metoprolol tartrate (LOPRESSOR) tablet 25 mg, 25 mg, Oral, Daily, Kamineni, Neelima, MD, 25 mg at 07/24/17 0950 .  mupirocin ointment (BACTROBAN) 2 % 1 application, 1 application, Nasal, BID, Guilford Shi, MD, 1 application at 35/57/32 0948 .  ondansetron (ZOFRAN) tablet 4 mg, 4 mg, Oral, Q6H PRN, Earnest Conroy, Neelima, MD .  oxymetazoline (AFRIN) 0.05 % nasal spray 1 spray, 1 spray, Each Nare, BID PRN, Earnest Conroy, Neelima, MD .  polyethylene glycol (MIRALAX / GLYCOLAX) packet 17 g, 17 g, Oral, Daily PRN, Guilford Shi, MD .  prasugrel (EFFIENT) tablet 10 mg, 10 mg, Oral, Daily, Guilford Shi, MD, 10 mg at 07/24/17 0950 Exam: Current vital signs: BP (!) 134/102   Pulse (!) 138   Temp 97.6 F (36.4 C) (Oral)   Resp 14   Ht 5\' 2"  (1.575 m)   Wt 114.9 kg (253 lb 4.9 oz)   SpO2 99%   BMI 46.33 kg/m  Vital signs in last 24 hours: Temp:  [97.6 F (36.4 C)-98 F (36.7 C)] 97.6 F (36.4 C) (07/21 0513) Pulse Rate:  [58-138] 138 (07/21 0928) Resp:  [14-22] 14 (07/21 0927) BP: (134-160)/(64-116) 134/102 (07/21 0928) SpO2:  [98 %-100 %] 99 % (07/21 0927)  GENERAL: Awake, alert, in mild respiratory distress HEENT: - Normocephalic and atraumatic, dry mm, no LN++, no Thyromegally LUNGS - Clear to auscultation bilaterally with no wheezes CV - S1S2 RRR, no m/r/g, equal pulses bilaterally. ABDOMEN - Soft, nontender, nondistended with normoactive BS Ext: warm, well perfused, intact peripheral pulses, trace edema in all fours  NEURO:  Mental Status: AA&Ox2 Language: speech is clear.  Naming, repetition, fluency, and comprehension intact.  Reduced attention concentration Cranial Nerves: PERRL. EOMI, visual fields full, no facial asymmetry, facial sensation intact, hearing intact, tongue/uvula/soft palate midline, normal sternocleidomastoid and trapezius muscle strength. No evidence of tongue atrophy or  fibrillations Motor: Symmetric nearly 5/5 in all 4 extremities with very prominent asterixis in all 4 extremities. Tone: is normal and bulk is normal Sensation- Intact to light touch bilaterally Coordination: FTN intact bilaterally Gait- deferred  NIHSS-1 for LOC questions  Labs I have reviewed labs in epic and the results pertinent to this consultation are:  CBC    Component Value Date/Time   WBC 6.4 07/24/2017 0639   RBC 3.14 (L) 07/24/2017 0639   HGB 9.1 (L) 07/24/2017 0639   HCT 30.4 (L) 07/24/2017 0639   PLT 180 07/24/2017 0639   MCV 96.8 07/24/2017 0639   MCH 29.0 07/24/2017 0639   MCHC 29.9 (L) 07/24/2017 0639   RDW 17.0 (H) 07/24/2017 0639   LYMPHSABS 1.1 07/21/2017 1524   MONOABS 0.5 07/21/2017 1524   EOSABS 0.1 07/21/2017 1524   BASOSABS 0.0 07/21/2017 1524    CMP     Component Value Date/Time   NA 145 07/24/2017 2025  K 4.6 07/24/2017 0639   CL 112 (H) 07/24/2017 0639   CO2 24 07/24/2017 0639   GLUCOSE 99 07/24/2017 0639   BUN 43 (H) 07/24/2017 0639   CREATININE 2.66 (H) 07/24/2017 0639   CALCIUM 8.4 (L) 07/24/2017 0639   PROT 6.5 07/21/2017 1524   ALBUMIN 3.2 (L) 07/21/2017 1524   AST 65 (H) 07/21/2017 1524   ALT 26 07/21/2017 1524   ALKPHOS 80 07/21/2017 1524   BILITOT 0.4 07/21/2017 1524   GFRNONAA 17 (L) 07/24/2017 0639   GFRAA 20 (L) 07/24/2017 0639  Ammonia 22 Vitamin B 12= 912 TSH 1.2  Imaging I have reviewed the images obtained: CT-scan of the brain-no acute changes with the caveat that the exam is exceptionally degraded by motion.  Assessment:  72 year old woman past history of diabetes, coronary artery disease, anxiety, depression, congestive heart failure, chronic hypoxic respiratory failure home on oxygen, seizure disorder for which she has been on Keppra for many years and last seizure was many years ago, admitted for cardiac catheterization for ACS with ongoing confusion. Neurological consultation placed for the altered mental  status. Her neurological examination does not reveal any cranial nerve, motor, sensory or cerebellar abnormalities.  She has prominent asterixis and reduced attention concentration suggestive of encephalopathy It would be prudent to evaluate for causes of encephalopathy including toxic and metabolic causes but she has plenty off at this time as well as ruling out a stroke because of the multiple risk factors and recent catheterization. In the absence of MRI that reveals strokes, her symptomatology can be attributed to toxic metabolic encephalopathy due to deranged renal function, acute illness as well as  Impression: Toxic metabolic encephalopathy Evaluate for stroke  Recommendations: -Correction of toxic metabolic derangements per primary team as you are. -In cases appear toxic metabolic encephalopathies, clinical improvement lags laboratory improvement.  Her labs are somewhat slowly improving, and if it indeed is metabolic encephalopathy, I would expect improvement to happen over the next few days. -MRI of the brain without contrast to evaluate for evidence of stroke -Routine EEG on Monday to look for any evidence of ongoing seizure activity, although exam is reassuring. -Continue Keppra 1 g twice daily -Reduce Xanax even further-0.25 mg po 3 times daily from the current 0.5 mg twice daily dosing. -Minimize sedating medications -Infectious work-up has been pursued with UA unremarkable.  Will defer infectious etiology work-up to the primary team as you are. Neurology will continue to follow with you.  -- Amie Portland, MD [facility] Pager: [phone] If 7pm to 7am, please call on call as listed on AMION.

## 2017-07-24 NOTE — NC FL2 (Signed)
[place] LEVEL OF CARE SCREENING TOOL     IDENTIFICATION  Patient Name: [name] Birthdate: 1945/08/09 Sex: female Admission Date (Current Location): 07/21/2017  [facility] and [place] Number:  Herbalist and Address:  [facility] [place], [facility] [address] [place], [place] [address]      Provider Number: [id number]  Attending Physician Name and Address:  Mendel Corning, MD  Relative Name and Phone Number:       Current Level of Care: Hospital Recommended Level of Care: [place] Prior Approval Number:    Date Approved/Denied:   PASRR Number: [id number]   Discharge Plan: SNF    Current Diagnoses: Patient Active Problem List   Diagnosis Date Noted  . Acute metabolic encephalopathy 54/65/0354  . Anemia 07/22/2017  . Acute renal failure (ARF) ([place]) 07/21/2017  . Pyelonephritis 01/24/2017  . LV dysfunction 10/06/2016  . 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 Acute on chronic diastolic CHF (congestive heart failure) ([place]) 09/09/2016  . Elevated troponin 09/09/2016  . Migraine 08/20/2016  . Morbid obesity ([place]) 08/20/2016  . Anxiety 10/02/2015  . DOE (dyspnea on exertion) 10/02/2015  . Dyslipidemia 10/02/2015  . Nonrheumatic aortic valve stenosis 10/02/2015  . Old MI (myocardial infarction) 10/02/2015  . Insomnia 11/03/2014  . Weakness generalized 08/28/2014  . Seizure disorder ([place]) 08/28/2014  . Diabetes mellitus without complication ([place]) 65/68/1275  . Coronary artery disease of native artery of native heart with stable angina pectoris ([place]) 06/02/2010  . Depression 05/20/2007  . Essential hypertension 05/20/2007  . Diaphragmatic hernia 05/20/2007  . Diverticulosis of colon 05/20/2007  . IBS 05/20/2007  . Fatty liver 05/20/2007  . Hypothyroidism 05/20/2007  . Hx of transient ischemic attack (TIA) 05/20/2007    Orientation RESPIRATION BLADDER  Height & Weight     Self  O2(Nasal Cannula 2L) External catheter, Incontinent(Placed 7/18) Weight: 253 lb 4.9 oz (114.9 kg) Height:  5\' 2"  (157.5 cm)  BEHAVIORAL SYMPTOMS/MOOD NEUROLOGICAL BOWEL NUTRITION STATUS      Incontinent Diet(DYS 3, thin liquids)  AMBULATORY STATUS COMMUNICATION OF NEEDS Skin   Extensive Assist Verbally Skin abrasions, Other (Comment)(Open wound, lower abdomen, no dressing)                       Personal Care Assistance Level of Assistance  Bathing, Feeding, Dressing Bathing Assistance: Maximum assistance Feeding assistance: Independent Dressing Assistance: Maximum assistance     Functional Limitations Info  Sight, Hearing, Speech Sight Info: Adequate Hearing Info: Adequate Speech Info: Adequate    SPECIAL CARE FACTORS FREQUENCY  PT (By licensed PT), OT (By licensed OT)     PT Frequency: 3x OT Frequency: 3x            Contractures Contractures Info: Not present    Additional Factors Info  Code Status, Allergies Code Status Info: Partial Allergies Info: Codeine           Current Medications (07/24/2017):  This is the current hospital active medication list Current Facility-Administered Medications  Medication Dose Route Frequency Provider Last Rate Last Dose  . acetaminophen (TYLENOL) tablet 650 mg  650 mg Oral Q4H PRN Kamineni, Neelima, MD      . albuterol (PROVENTIL) (2.5 MG/3ML) 0.083% nebulizer solution 2.5 mg  2.5 mg Nebulization Q4H PRN Guilford Shi, MD      . albuterol (PROVENTIL,VENTOLIN) solution continuous neb  10 mg/hr Nebulization Continuous Charlesetta Shanks, MD 2  mL/hr at 07/21/17 1655 10 mg/hr at 07/21/17 1655  . ALPRAZolam Duanne Moron) tablet 0.5 mg  0.5 mg Oral BID Rai, Ripudeep K, MD   0.5 mg at 07/23/17 2122  . aspirin EC tablet 325 mg  325 mg Oral Daily Guilford Shi, MD   325 mg at 07/24/17 0950  . Chlorhexidine Gluconate Cloth 2 % PADS 6 each  6 each Topical Q0600 Guilford Shi, MD   6 each at 07/24/17 0601   . enoxaparin (LOVENOX) injection 40 mg  40 mg Subcutaneous Q24H Guilford Shi, MD   40 mg at 07/23/17 2122  . insulin aspart (novoLOG) injection 0-5 Units  0-5 Units Subcutaneous QHS Rai, Ripudeep K, MD      . insulin aspart (novoLOG) injection 0-9 Units  0-9 Units Subcutaneous TID WC Rai, Ripudeep K, MD   1 Units at 07/23/17 1232  . isosorbide mononitrate (IMDUR) 24 hr tablet 30 mg  30 mg Oral Daily Guilford Shi, MD   30 mg at 07/24/17 0950  . levETIRAcetam (KEPPRA) tablet 1,000 mg  1,000 mg Oral BID Guilford Shi, MD   1,000 mg at 07/24/17 1048  . MEDLINE mouth rinse  15 mL Mouth Rinse BID Guilford Shi, MD   15 mL at 07/24/17 0954  . metoprolol tartrate (LOPRESSOR) tablet 25 mg  25 mg Oral Daily Guilford Shi, MD   25 mg at 07/24/17 0950  . mupirocin ointment (BACTROBAN) 2 % 1 application  1 application Nasal BID Guilford Shi, MD   1 application at 75/88/32 0948  . ondansetron (ZOFRAN) tablet 4 mg  4 mg Oral Q6H PRN Guilford Shi, MD      . oxymetazoline (AFRIN) 0.05 % nasal spray 1 spray  1 spray Each Nare BID PRN Kamineni, Neelima, MD      . polyethylene glycol (MIRALAX / GLYCOLAX) packet 17 g  17 g Oral Daily PRN Guilford Shi, MD      . prasugrel (EFFIENT) tablet 10 mg  10 mg Oral Daily Guilford Shi, MD   10 mg at 07/24/17 5498     Discharge Medications: Please see discharge summary for a list of discharge medications.  Relevant Imaging Results:  Relevant Lab Results:   Additional Information SSN:  [id number]  Eileen Stanford, LCSW

## 2017-07-24 NOTE — Clinical Social Work Note (Signed)
Clinical Social Work Assessment  Patient Details  Name: [name] MRN: [id number] Date of Birth: March 03, 1945  Date of referral:  07/24/17               Reason for consult:  Facility Placement                Permission sought to share information with:  Family Supports Permission granted to share information::     Name::     Network engineer::  [name]  Relationship::  Spouse  Contact Information:     Housing/Transportation Living arrangements for the past 2 months:  [name] of Information:  Spouse Patient Interpreter Needed:  None Criminal Activity/Legal Involvement Pertinent to Current Situation/Hospitalization:  No - Comment as needed Significant Relationships:  Adult Children, Spouse Lives with:  Spouse Do you feel safe going back to the place where you live?  No Need for family participation in patient care:  No (Coment)  Care giving concerns:  Pt is only alert to self. CSW spoke with pt's spouse via telephone.   Social Worker assessment / plan:  CSW spoke with pt's spouse via telephone. [facility] was previously at [name]. [name] really worried about pt and states "she didn't know who I was." [name] states he would like for her to go back to [name] at d/c however, states pt's daughter removed all pt's stuff from facility because they were not doing a bed hold. CSW will follow up with facility. Pending PT eval.  Employment status:  Retired Forensic scientist:  Medicare PT Recommendations:  Not assessed at this time Information / Referral to community resources:  [name]  Patient/Family's Response to care:  [name] verbalized understanding of CSW role and expressed appreciation for support. [name] denies any concern regarding pt care at this time.   Patient/Family's Understanding of and Emotional Response to Diagnosis, Current Treatment, and Prognosis:  [name] understanding and realistic regarding  pt's physical limitations. [name] understands the need for pt to go to SNF at d/c.  Pt denies any concern regarding pt's treatment plan at this time. CSW will continue to provide support and facilitate d/c needs.   Emotional Assessment Appearance:  Appears stated age Attitude/Demeanor/Rapport:  Unable to Assess Affect (typically observed):  Unable to Assess Orientation:  Oriented to Self Alcohol / Substance use:  Not Applicable Psych involvement (Current and /or in the community):  No (Comment)  Discharge Needs  Concerns to be addressed:  Basic Needs, Care Coordination Readmission within the last 30 days:  No Current discharge risk:  Dependent with Mobility Barriers to Discharge:  Continued Medical Work up   [employer], LCSW 07/24/2017, 11:08 AM

## 2017-07-25 ENCOUNTER — Inpatient Hospital Stay (HOSPITAL_COMMUNITY)
Admit: 2017-07-25 | Discharge: 2017-07-25 | Disposition: A | Discharge status: Home / Self Care | Payer: Medicare Other | Attending: Neurology | Admitting: Neurology

## 2017-07-25 DIAGNOSIS — E119 Type 2 diabetes mellitus without complications: Secondary | ICD-10-CM

## 2017-07-25 LAB — GLUCOSE, CAPILLARY
GLUCOSE-CAPILLARY: 138 mg/dL — AB (ref 70–99)
Glucose-Capillary: 85 mg/dL (ref 70–99)
Glucose-Capillary: 150 mg/dL — ABNORMAL HIGH (ref 70–99)
Glucose-Capillary: 138 mg/dL — ABNORMAL HIGH (ref 70–99)

## 2017-07-25 LAB — CBC
HCT: 36.1 % (ref 36.0–46.0)
Hemoglobin: 10.8 g/dL — ABNORMAL LOW (ref 12.0–15.0)
MCH: 28.8 pg (ref 26.0–34.0)
MCHC: 29.9 g/dL — ABNORMAL LOW (ref 30.0–36.0)
MCV: 96.3 fL (ref 78.0–100.0)
Platelets: 220 10*3/uL (ref 150–400)
RBC: 3.75 MIL/uL — ABNORMAL LOW (ref 3.87–5.11)
RDW: 16.8 % — ABNORMAL HIGH (ref 11.5–15.5)
WBC: 7.2 10*3/uL (ref 4.0–10.5)

## 2017-07-25 LAB — CK: Total CK: 168 U/L (ref 38–234)

## 2017-07-25 LAB — URINE CULTURE

## 2017-07-25 LAB — PROTEIN ELECTROPHORESIS, SERUM
A/G Ratio: 0.9 (ref 0.7–1.7)
ALBUMIN ELP: 3 g/dL (ref 2.9–4.4)
Alpha-1-Globulin: 0.3 g/dL (ref 0.0–0.4)
Alpha-2-Globulin: 0.8 g/dL (ref 0.4–1.0)
Beta Globulin: 1 g/dL (ref 0.7–1.3)
GLOBULIN, TOTAL: 3.3 g/dL (ref 2.2–3.9)
Gamma Globulin: 1.2 g/dL (ref 0.4–1.8)
Total Protein ELP: 6.3 g/dL (ref 6.0–8.5)

## 2017-07-25 LAB — COMPREHENSIVE METABOLIC PANEL
ALT: 24 U/L (ref 0–44)
ANION GAP: 12 (ref 5–15)
AST: 48 U/L — ABNORMAL HIGH (ref 15–41)
Albumin: 3.2 g/dL — ABNORMAL LOW (ref 3.5–5.0)
Alkaline Phosphatase: 80 U/L (ref 38–126)
BUN: 37 mg/dL — ABNORMAL HIGH (ref 8–23)
CALCIUM: 8.8 mg/dL — AB (ref 8.9–10.3)
CO2: 24 mmol/L (ref 22–32)
Chloride: 107 mmol/L (ref 98–111)
Creatinine, Ser: 1.89 mg/dL — ABNORMAL HIGH (ref 0.44–1.00)
GFR calc Af Amer: 29 mL/min — ABNORMAL LOW (ref >60)
GFR calc non Af Amer: 25 mL/min — ABNORMAL LOW (ref >60)
Glucose, Bld: 153 mg/dL — ABNORMAL HIGH (ref 70–99)
Potassium: 4.1 mmol/L (ref 3.5–5.1)
Sodium: 143 mmol/L (ref 135–145)
Total Bilirubin: 1 mg/dL (ref 0.3–1.2)
Total Protein: 7 g/dL (ref 6.5–8.1)

## 2017-07-25 NOTE — Progress Notes (Addendum)
Subjective: Patient has no complaints at this time.  Exam: Vitals:   07/24/17 1651 07/25/17 0627  BP: (!) 151/78 (!) 97/53  Pulse: 65 68  Resp: 18 20  Temp: 98.4 F (36.9 C) 98 F (36.7 C)  SpO2: 100% 99%    Physical Exam   HEENT-  Normocephalic, no lesions, without obvious abnormality.  Right eye inferior conjunctiva has blood noted.  Lungs-on oxygen and wheezing however this is her baseline Abdomen- All 4 quadrants palpated and nontender Extremities- Warm, dry and intact Musculoskeletal-no joint tenderness, deformity or swelling Skin-warm and dry, no hyperpigmentation, vitiligo, or suspicious lesions    Neuro:  Mental Status: Alert, she is only oriented to hospital and also month -when asked what hospital she believes that she is at the nursing home, she believes the year is 67 but able to name thumb, small finger and follow simple commands.  When asked to follow complex commands such as taking her left thumb and touching her right ear she takes her left thumb and touches her left ear.  Speech is clear without any aphasia or dysarthria. Cranial Nerves: II: Visual fields grossly normal,  III,IV, VI: ptosis not present, extra-ocular motions intact bilaterally pupils equal, round, reactive to light and accommodation V,VII: smile symmetric, facial light touch sensation normal bilaterally VIII: hearing normal bilaterally IX,X: uvula rises symmetrically XI: bilateral shoulder shrug XII: midline tongue extension Motor: Right : Upper extremity   4+/5    Left:     Upper extremity   4+/5  Lower extremity   4/5     Lower extremity   4/5 Unable to lift bilateral legs off the bed with her hips Noticeable asterixis when arms are held out in front of her- right greater than left Tone and bulk:normal tone throughout; no atrophy noted Sensory: Pinprick and light touch intact throughout, bilaterally Plantars: Right: downgoing   Left: downgoing Cerebellar: normal  finger-to-nose, Gait:     Medications:  Scheduled: . ALPRAZolam  0.5 mg Oral BID  . aspirin  325 mg Oral Daily  . Chlorhexidine Gluconate Cloth  6 each Topical Q0600  . enoxaparin (LOVENOX) injection  40 mg Subcutaneous Q24H  . insulin aspart  0-5 Units Subcutaneous QHS  . insulin aspart  0-9 Units Subcutaneous TID WC  . isosorbide mononitrate  30 mg Oral Daily  . levETIRAcetam  1,000 mg Oral BID  . mouth rinse  15 mL Mouth Rinse BID  . metoprolol tartrate  25 mg Oral Daily  . mupirocin ointment  1 application Nasal BID  . prasugrel  10 mg Oral Daily    Pertinent Labs/Diagnostics: CMP pending MRI pending EEG pending  Dg Chest Port 1 View  Result Date: 07/24/2017 CLINICAL DATA:  Dyspnea EXAM: PORTABLE CHEST 1 VIEW COMPARISON:  07/21/2017 FINDINGS: Moderate cardiomegaly with diffuse vascular congestion and interstitial prominence compatible with interstitial edema. Mild CHF suspected. No large effusion or pneumothorax. No focal pneumonia, collapse or consolidation. Trachea is midline. IMPRESSION: Mild CHF pattern Electronically Signed   By: Jerilynn Mages.  Shick M.D.   On: 07/24/2017 08:22     Etta Quill PA-C [facility] [phone]   Assessment:  Patient initially admitted for cardiac catheterization however was noted to have prolonged encephalopathy post procedure.  I believe most likely this is toxic metabolic encephalopathy.  However given that she did have a cardiac catheterization cannot fully rule out embolic infarcts.   Impression:  Toxic metabolic encephalopathy Possible cardiembolic stroke    Recommendations: - Pending CMP and CBC -  Pending EEG - MRI brain pending    07/25/2017, 8:58 AM    NEUROHOSPITALIST ADDENDUM Seen and examined the patient today. I have reviewed the contents of history and physical exam as documented by PA/ARNP/Resident and agree with above documentation.  I have discussed and formulated the above plan as documented. Edits to  the note have been made as needed.  Toxic metabolic encephalopathy.    Patient appears to be improving, has a conversation with me.  However does not know why she is in the hospital, nor can she name which hospital she is not.  Her MRI brain to rule out stroke is still pending.  EEG was performed this evening however results are still pending.   Karena Addison Aroor MD [facility] [phone]   If 7pm to 7am, please call on call as listed on AMION.

## 2017-07-25 NOTE — Progress Notes (Signed)
[facility]                                                                              Patient Demographics  [name], is a 72 y.o. female, DOB - 04/26/1945, CLE:[id number]  Admit date - 07/21/2017   Admitting Physician Guilford Shi, MD  Outpatient Primary MD for the patient is Briscoe Deutscher, DO  Outpatient specialists:   LOS - 4  days   Medical records reviewed and are as summarized below:    Chief Complaint  Patient presents with  . Abnormal Lab       Brief summary   Patient is a 72 year old female with IDDM, CAD, depression, anxiety, hiatal hernia/GERD, hypertension, hyperlipidemia, diastolic CHF, chronic hypoxic respiratory failure on home O2 2 L, seizure disorder who was admitted in January 9449 for complicated UTI, presented with acute mental status changes from [facility] skilled nursing facility.  Patient was admitted in 06/2017 at [facility] for ACS and underwent cardiac cath.  Patient also noted to be on multiple sedatives at baseline and Xanax.  Patient was receiving Lasix and potassium supplementation at skilled nursing facility.  She was started on Lasix 20 mg daily on 7/12 however received additional dose of 40 mg on 7/8 and 7/10.  At the time of discharge from [facility], she was on Lasix as needed only.  Creatinine at the time of discharge from [facility] hospital was 1.1.   Assessment & Plan    Principal Problem:   Acute metabolic encephalopathy - Multifactorial likely due to acute kidney injury, dehydration, polypharmacy. - Continue to hold narcotics, restarted Xanax to avoid benzodiazepine withdrawals - UA negative for UTI.  CT head negative for acute CVA. - Ammonia level normal, folate normal, B12 normal - neurology evaluation was done, MRI EEG pending - improving today, will continue monitoring, start PT  Active Problems: Acute kidney injury with hyperkalemia, rhabdomyolysis - Likely  due to diuretics, dehydration.  UA negative for UTI. Cr 4.7 on admission -Renal ultrasound showed no acute findings or hydronephrosis -Creatinine 1.8, baseline 1.1 in 06/2017   acute on chronic diastolic CHF due to volume overload, with wheezing and shortness of breath -much improved today -Chest x-ray on 7/21 showed diffuse vascular congestion, with BNP 2128.9, I's and O's with net positive of 5.6 L on 7/21, net positive 540 cc, 5L out in last 24hrs    Acute rhabdomyolysis -CK 1270, no report of any falls -> 168  -CK improving, continue to hold statin    Essential hypertension -BP currently stable, continue Imdur, beta-blocker    Hypothyroidism -TSH 1.2, continue Synthroid    Coronary artery disease of native artery of native heart with stable angina pectoris (HCC) -Continue aspirin, Effient, beta-blocker -Hold statin due to rhabdomyolysis    Diabetes mellitus type II, uncontrolled with hypoglycemia -CBG stable, continue sliding scale insulin.  Hemoglobin A1c 7.9    dyslipidemia -Hold statin due to rhabdo    Morbid obesity (HCC) -BMI 46.3, will need diet and weight control  Chronic anemia  -Anemia panel consistent with anemia of chronic disease, hemoglobin improved 9.9 after 1 unit  packed RBCs on 7/19  - H/H stable   Code Status: Partial DVT Prophylaxis:  Lovenox Family Communication: no family at the bedside   Disposition Plan:   Time Spent in minutes 25 minutes  Procedures:  Renal ultrasound  Consultants:   Nephrology  Neurology  Antimicrobials:      Medications  Scheduled Meds: . ALPRAZolam  0.5 mg Oral BID  . aspirin  325 mg Oral Daily  . Chlorhexidine Gluconate Cloth  6 each Topical Q0600  . enoxaparin (LOVENOX) injection  40 mg Subcutaneous Q24H  . insulin aspart  0-5 Units Subcutaneous QHS  . insulin aspart  0-9 Units Subcutaneous TID WC  . isosorbide mononitrate  30 mg Oral Daily  . levETIRAcetam  1,000 mg Oral BID  . mouth rinse  15 mL Mouth  Rinse BID  . metoprolol tartrate  25 mg Oral Daily  . mupirocin ointment  1 application Nasal BID  . prasugrel  10 mg Oral Daily   Continuous Infusions: . albuterol 10 mg/hr (07/21/17 1655)   PRN Meds:.acetaminophen, albuterol, ondansetron, oxymetazoline, polyethylene glycol   Antibiotics   Anti-infectives (From admission, onward)   None        Subjective:   [name] was seen and examined today.  Mental status somewhat improving today, was alert and oriented x3 at the time of my examination this morning.  No wheezing shortness of breath much better.  No fevers or chills or any pain.  Objective:   Vitals:   07/24/17 1651 07/24/17 2045 07/25/17 0627 07/25/17 0902  BP: (!) 151/78  (!) 97/53 (!) 129/96  Pulse: 65  68 89  Resp: 18  20 (!) 29  Temp: 98.4 F (36.9 C)  98 F (36.7 C) 98.2 F (36.8 C)  TempSrc: Oral Other (Comment) Oral Oral  SpO2: 100%  99% 96%  Weight:      Height:        Intake/Output Summary (Last 24 hours) at 07/25/2017 1252 Last data filed at 07/25/2017 1200 Gross per 24 hour  Intake 600 ml  Output 4500 ml  Net -3900 ml     Wt Readings from Last 3 Encounters:  07/22/17 114.9 kg (253 lb 4.9 oz)  03/22/17 104.1 kg (229 lb 9.6 oz)  01/21/17 105.7 kg (233 lb)     Exam    General: Alert and oriented x 3, NAD  Eyes: ,  HEENT:  Atraumatic, normocephalic  Cardiovascular: S1 S2 auscultated, Regular rate and rhythm. 1+ pedal edema b/l  Respiratory: Decreased breath sound at the bases  Gastrointestinal: Obese, soft, nontender, nondistended, + bowel sounds  Ext: 1+ pedal edema bilaterally  Neuro: strength 5/5 in upper and lower extremities bilaterally   Musculoskeletal: No digital cyanosis, clubbing  Skin: No rashes  Psych: mental status improving alert and oriented x3    Data Reviewed:  I have personally reviewed following labs and imaging studies  Micro Results Recent Results (from the past 240 hour(s))  MRSA PCR Screening      Status: Abnormal   Collection Time: 07/21/17  8:12 PM  Result Value Ref Range Status   MRSA by PCR POSITIVE (A) NEGATIVE Final    Comment:        The GeneXpert MRSA Assay (FDA approved for NASAL specimens only), is one component of a comprehensive MRSA colonization surveillance program. It is not intended to diagnose MRSA infection nor to guide or monitor treatment for MRSA infections. RESULT CALLED TO, READ BACK BY AND VERIFIED WITH: E CASTRO RN  07/21/17 2316 JDW Performed at [facility], [place] [address], [place], [place] [address]   Urine Culture     Status: Abnormal   Collection Time: 07/22/17  6:39 AM  Result Value Ref Range Status   Specimen Description URINE, RANDOM  Final   Special Requests   Final    NONE Performed at [facility], [place] [address] [place], [place] [address]    Culture >=100,000 COLONIES/mL KLEBSIELLA PNEUMONIAE (A)  Final   Report Status 07/25/2017 FINAL  Final   Organism ID, Bacteria KLEBSIELLA PNEUMONIAE (A)  Final      Susceptibility   Klebsiella pneumoniae - MIC*    AMPICILLIN RESISTANT Resistant     CEFAZOLIN <=4 SENSITIVE Sensitive     CEFTRIAXONE <=1 SENSITIVE Sensitive     CIPROFLOXACIN <=0.25 SENSITIVE Sensitive     GENTAMICIN <=1 SENSITIVE Sensitive     IMIPENEM <=0.25 SENSITIVE Sensitive     NITROFURANTOIN 64 INTERMEDIATE Intermediate     TRIMETH/SULFA >=320 RESISTANT Resistant     AMPICILLIN/SULBACTAM 4 SENSITIVE Sensitive     PIP/TAZO <=4 SENSITIVE Sensitive     Extended ESBL NEGATIVE Sensitive     * >=100,000 COLONIES/mL KLEBSIELLA PNEUMONIAE    Radiology Reports Ct Head Wo Contrast  Result Date: 07/22/2017 CLINICAL DATA:  Altered level of consciousness, combative. History of hypertension, hyperlipidemia, seizure. EXAM: CT HEAD WITHOUT CONTRAST TECHNIQUE: Contiguous axial images were obtained from the base of the skull through the vertex without intravenous contrast. COMPARISON:  CT HEAD August 29, 2014 FINDINGS:  Large body habitus results in overall noisy image quality. Moderately motion degraded examination. BRAIN: No intraparenchymal hemorrhage, mass effect nor midline shift. The ventricles and sulci are normal for age. Patchy to confluent supratentorial white matter hypodensities. No acute large vascular territory infarcts. No abnormal extra-axial fluid collections. Basal cisterns are patent. VASCULAR: Moderate calcific atherosclerosis of the carotid siphons. SKULL: No skull fracture. No significant scalp soft tissue swelling. SINUSES/ORBITS: Confluent LEFT mastoid air cells with minimal soft tissue density. Mild paranasal sinus mucosal thickening. Hypoplastic frontal sinuses.The included ocular globes and orbital contents are non-suspicious. OTHER: Patient is edentulous. IMPRESSION: 1. Habitus and motion degraded examination. No acute intracranial process. 2. Moderate chronic small vessel ischemic changes. 3. Chronic coalescent LEFT mastoiditis. Electronically Signed   By: Elon Alas M.D.   On: 07/22/2017 14:00   US Renal  Result Date: 07/22/2017 CLINICAL DATA:  Acute kidney injury EXAM: RENAL / URINARY TRACT ULTRASOUND COMPLETE COMPARISON:  06/22/2017 FINDINGS: Right Kidney: Length: 10.1 cm. Difficult to visualize. No visible hydronephrosis. Normal echotexture. Left Kidney: Length: 9.8 cm. Echogenicity within normal limits. No mass or hydronephrosis visualized. Bladder: Appears normal for degree of bladder distention. IMPRESSION: No acute findings.  No hydronephrosis. Electronically Signed   By: Rolm Baptise M.D.   On: 07/22/2017 11:06   Dg Chest Port 1 View  Result Date: 07/24/2017 CLINICAL DATA:  Dyspnea EXAM: PORTABLE CHEST 1 VIEW COMPARISON:  07/21/2017 FINDINGS: Moderate cardiomegaly with diffuse vascular congestion and interstitial prominence compatible with interstitial edema. Mild CHF suspected. No large effusion or pneumothorax. No focal pneumonia, collapse or consolidation. Trachea is midline.  IMPRESSION: Mild CHF pattern Electronically Signed   By: Jerilynn Mages.  Shick M.D.   On: 07/24/2017 08:22   Dg Chest Port 1 View  Result Date: 07/21/2017 CLINICAL DATA:  Shortness of breath EXAM: PORTABLE CHEST 1 VIEW COMPARISON:  06/20/2017 FINDINGS: Low lung volumes are present, causing crowding of the pulmonary vasculature. The patient is rotated to  the right on today's radiograph, reducing diagnostic sensitivity and specificity. Suspected mild enlargement of the cardiopericardial silhouette vague prominence of the right hilum is likely attributable to the rightward rotation. IMPRESSION: 1. Low lung volumes are present, causing crowding of the pulmonary vasculature. 2. Mild enlargement of the cardiopericardial silhouette. 3. Reduced exam sensitivity due to rightward rotation. Electronically Signed   By: Van Clines M.D.   On: 07/21/2017 15:53    Lab Data:  CBC: Recent Labs  Lab 07/21/17 1524 07/21/17 1604 07/22/17 0119 07/23/17 0746 07/24/17 0639 07/25/17 1015  WBC 6.0  --  6.2 6.4 6.4 7.2  NEUTROABS 4.3  --   --   --   --   --   HGB 8.0* 8.8* 7.9* 9.9* 9.1* 10.8*  HCT 26.9* 26.0* 25.9* 32.3* 30.4* 36.1  MCV 97.8  --  96.3 95.6 96.8 96.3  PLT 179  --  177 184 180 093   Basic Metabolic Panel: Recent Labs  Lab 07/21/17 1524  07/21/17 1847 07/22/17 0119 07/23/17 0746 07/24/17 0639 07/25/17 1015  NA 136   < > 138 139 143 145 143  K 6.8*   < > 5.5* 6.1* 5.8* 4.6 4.1  CL 101   < > 102 102 109 112* 107  CO2 26  --  26 26 21* 24 24  GLUCOSE 78   < > 49* 124* 115* 99 153*  BUN 57*   < > 56* 58* 54* 43* 37*  CREATININE 4.70*   < > 4.67* 4.67* 3.79* 2.66* 1.89*  CALCIUM 8.7*  --  8.7* 8.8* 8.5* 8.4* 8.8*  MG 2.1  --   --   --   --   --   --   PHOS 6.8*  --   --   --   --   --   --    < > = values in this interval not displayed.   GFR: Estimated Creatinine Clearance: 32.3 mL/min (A) (by C-G formula based on SCr of 1.89 mg/dL (H)). Liver Function Tests: Recent Labs  Lab  07/21/17 1524 07/25/17 1015  AST 65* 48*  ALT 26 24  ALKPHOS 80 80  BILITOT 0.4 1.0  PROT 6.5 7.0  ALBUMIN 3.2* 3.2*   Recent Labs  Lab 07/21/17 1524  LIPASE 25   Recent Labs  Lab 07/22/17 1406  AMMONIA 22   Coagulation Profile: Recent Labs  Lab 07/21/17 1524  INR 1.24   Cardiac Enzymes: Recent Labs  Lab 07/22/17 0655 07/23/17 0746 07/24/17 0639 07/25/17 1015  CKTOTAL 1,270* 814* 465* 168   BNP (last 3 results) No results for input(s): PROBNP in the last 8760 hours. HbA1C: Recent Labs    07/22/17 1406  HGBA1C 7.9*   CBG: Recent Labs  Lab 07/24/17 1158 07/24/17 1648 07/24/17 2138 07/25/17 0813 07/25/17 1126  GLUCAP 85 92 105* 85 150*   Lipid Profile: No results for input(s): CHOL, HDL, LDLCALC, TRIG, CHOLHDL, LDLDIRECT in the last 72 hours. Thyroid Function Tests: Recent Labs    07/22/17 1423  TSH 1.279   Anemia Panel: Recent Labs    07/22/17 1406 07/22/17 1423  VITAMINB12  --  912  FOLATE 8.9  --   FERRITIN  --  43  TIBC  --  253  IRON  --  47  RETICCTPCT  --  1.6   Urine analysis:    Component Value Date/Time   COLORURINE YELLOW 07/23/2017 1142   APPEARANCEUR CLOUDY (A) 07/23/2017 1142   LABSPEC 1.016 07/23/2017  [place] 5.0 07/23/2017 1142   GLUCOSEU NEGATIVE 07/23/2017 1142   HGBUR SMALL (A) 07/23/2017 1142   BILIRUBINUR NEGATIVE 07/23/2017 1142   BILIRUBINUR Negative 01/21/2017 1044   KETONESUR NEGATIVE 07/23/2017 1142   PROTEINUR 100 (A) 07/23/2017 1142   UROBILINOGEN 0.2 01/21/2017 1044   UROBILINOGEN 0.2 08/28/2014 1555   NITRITE NEGATIVE 07/23/2017 1142   LEUKOCYTESUR NEGATIVE 07/23/2017 1142     Ripudeep Rai M.D. [facility] 07/25/2017, 12:52 PM  Pager: [phone] Between 7am to 7pm - call Pager - [phone]  After 7pm go to [email or web address] - password [email or web address]  Call night coverage person covering after 7pm

## 2017-07-25 NOTE — Procedures (Signed)
ELECTROENCEPHALOGRAM REPORT   Patient: [name]       Room #: 5M16C EEG No. ID: [id number] Age: 73 y.o.        Sex: female Referring Physician: Rai Report Date:  07/25/2017        Interpreting Physician: Alexis Goodell  History: Shelly Spenser is an 72 y.o. female with a history of seizures presenting with altered mental status  Medications:  Xanax, ASA, Insulin, Keppra, Lopressor, Effient, Albuterol  Conditions of Recording:  This is a 21 channel routine scalp EEG performed with bipolar and monopolar montages arranged in accordance to the international 10/20 system of electrode placement. One channel was dedicated to EKG recording.  The patient in the drowsy and asleep states.  Description:  The patient is asleep throughout much of the recording.  Stage II sleep is noted with symmetrical sleep spindles, vertex central sharp transients and irregular slow activity.  The patient is alerted towards the end of the recording.  The patient is unable to achieve and maintain a waking posterior background rhythm but appears to remain in drowse with a slow background that is irregular consisting of low voltage theta and beta activity.  No epileptiform activity is noted.    Hyperventilation and intermittent photic stimulation were not performed  IMPRESSION: This is a normal drowsy and asleep electroencephalogram.  No epileptiform activity is noted.     Alexis Goodell, MD Neurology [phone] 07/25/2017, 6:41 PM

## 2017-07-25 NOTE — Progress Notes (Signed)
EEG complete - results pending 

## 2017-07-25 NOTE — Progress Notes (Addendum)
[facility] ROUNDING NOTE   Subjective:   Interval History: has complaints confusion. She denied chest pain, abdominal pain or myalgias. She did not recall seeing this MD on prior occaisions.  Objective:  Vital signs in last 24 hours:  Temp:  [98 F (36.7 C)-98.4 F (36.9 C)] 98.2 F (36.8 C) (07/22 0902) Pulse Rate:  [65-89] 89 (07/22 0902) Resp:  [18-29] 29 (07/22 0902) BP: (97-151)/(53-96) 129/96 (07/22 0902) SpO2:  [96 %-100 %] 96 % (07/22 0902)  Weight change:  Filed Weights   07/22/17 1126  Weight: 253 lb 4.9 oz (114.9 kg)    Intake/Output: I/O last 3 completed shifts: In: 2877.7 [P.O.:510; I.V.:2367.7] Out: 6400 [Urine:6400]   Intake/Output this shift:  No intake/output data recorded.  Physical Exam: General: alert and oriented to self, hospital only. Afebrile, nondiaphoretic CVS- RRR, GIII systolic ejection murmur RS- CTA bilaterally  ABD- BS present soft non-distended EXT- mild edema bilaterally   Basic Metabolic Panel: Recent Labs  Lab 07/21/17 1524 07/21/17 1604 07/21/17 1847 07/22/17 0119 07/23/17 0746 07/24/17 0639  NA 136 134* 138 139 143 145  K 6.8* 6.7* 5.5* 6.1* 5.8* 4.6  CL 101 102 102 102 109 112*  CO2 26  --  26 26 21* 24  GLUCOSE 78 74 49* 124* 115* 99  BUN 57* 56* 56* 58* 54* 43*  CREATININE 4.70* 4.70* 4.67* 4.67* 3.79* 2.66*  CALCIUM 8.7*  --  8.7* 8.8* 8.5* 8.4*  MG 2.1  --   --   --   --   --   PHOS 6.8*  --   --   --   --   --     Liver Function Tests: Recent Labs  Lab 07/21/17 1524  AST 65*  ALT 26  ALKPHOS 80  BILITOT 0.4  PROT 6.5  ALBUMIN 3.2*   Recent Labs  Lab 07/21/17 1524  LIPASE 25   Recent Labs  Lab 07/22/17 1406  AMMONIA 22    CBC: Recent Labs  Lab 07/21/17 1524 07/21/17 1604 07/22/17 0119 07/23/17 0746 07/24/17 0639  WBC 6.0  --  6.2 6.4 6.4  NEUTROABS 4.3  --   --   --   --   HGB 8.0* 8.8* 7.9* 9.9* 9.1*  HCT 26.9* 26.0* 25.9* 32.3* 30.4*  MCV 97.8  --  96.3 95.6 96.8   PLT 179  --  177 184 180    Cardiac Enzymes: Recent Labs  Lab 07/22/17 0655 07/23/17 0746 07/24/17 0639  CKTOTAL 1,270* 814* 465*    BNP: Invalid input(s): POCBNP  CBG: Recent Labs  Lab 07/24/17 0730 07/24/17 1158 07/24/17 1648 07/24/17 2138 07/25/17 0813  GLUCAP 98 85 92 105* 28    Microbiology: Results for orders placed or performed during the hospital encounter of 07/21/17  MRSA PCR Screening     Status: Abnormal   Collection Time: 07/21/17  8:12 PM  Result Value Ref Range Status   MRSA by PCR POSITIVE (A) NEGATIVE Final    Comment:        The GeneXpert MRSA Assay (FDA approved for NASAL specimens only), is one component of a comprehensive MRSA colonization surveillance program. It is not intended to diagnose MRSA infection nor to guide or monitor treatment for MRSA infections. RESULT CALLED TO, READ BACK BY AND VERIFIED WITH: E CASTRO RN 07/21/17 2316 JDW Performed at [facility], [place] [address] [place], [place] [address]   Urine Culture     Status: Abnormal  Collection Time: 07/22/17  6:39 AM  Result Value Ref Range Status   Specimen Description URINE, RANDOM  Final   Special Requests   Final    NONE Performed at [facility], [place] [address], [place], [place] [address]    Culture >=100,000 COLONIES/mL KLEBSIELLA PNEUMONIAE (A)  Final   Report Status 07/25/2017 FINAL  Final   Organism ID, Bacteria KLEBSIELLA PNEUMONIAE (A)  Final      Susceptibility   Klebsiella pneumoniae - MIC*    AMPICILLIN RESISTANT Resistant     CEFAZOLIN <=4 SENSITIVE Sensitive     CEFTRIAXONE <=1 SENSITIVE Sensitive     CIPROFLOXACIN <=0.25 SENSITIVE Sensitive     GENTAMICIN <=1 SENSITIVE Sensitive     IMIPENEM <=0.25 SENSITIVE Sensitive     NITROFURANTOIN 64 INTERMEDIATE Intermediate     TRIMETH/SULFA >=320 RESISTANT Resistant     AMPICILLIN/SULBACTAM 4 SENSITIVE Sensitive     PIP/TAZO <=4 SENSITIVE Sensitive     Extended ESBL NEGATIVE Sensitive     *  >=100,000 COLONIES/mL KLEBSIELLA PNEUMONIAE    Coagulation Studies: No results for input(s): LABPROT, INR in the last 72 hours.  Urinalysis: Recent Labs    07/23/17 1142  COLORURINE YELLOW  LABSPEC 1.016  PHURINE 5.0  GLUCOSEU NEGATIVE  HGBUR SMALL*  BILIRUBINUR NEGATIVE  KETONESUR NEGATIVE  PROTEINUR 100*  NITRITE NEGATIVE  LEUKOCYTESUR NEGATIVE    Imaging: Dg Chest Port 1 View  Result Date: 07/24/2017 CLINICAL DATA:  Dyspnea EXAM: PORTABLE CHEST 1 VIEW COMPARISON:  07/21/2017 FINDINGS: Moderate cardiomegaly with diffuse vascular congestion and interstitial prominence compatible with interstitial edema. Mild CHF suspected. No large effusion or pneumothorax. No focal pneumonia, collapse or consolidation. Trachea is midline. IMPRESSION: Mild CHF pattern Electronically Signed   By: Jerilynn Mages.  Shick M.D.   On: 07/24/2017 08:22   Medications:   . albuterol 10 mg/hr (07/21/17 1655)   . ALPRAZolam  0.5 mg Oral BID  . aspirin  325 mg Oral Daily  . Chlorhexidine Gluconate Cloth  6 each Topical Q0600  . enoxaparin (LOVENOX) injection  40 mg Subcutaneous Q24H  . insulin aspart  0-5 Units Subcutaneous QHS  . insulin aspart  0-9 Units Subcutaneous TID WC  . isosorbide mononitrate  30 mg Oral Daily  . levETIRAcetam  1,000 mg Oral BID  . mouth rinse  15 mL Mouth Rinse BID  . metoprolol tartrate  25 mg Oral Daily  . mupirocin ointment  1 application Nasal BID  . prasugrel  10 mg Oral Daily   acetaminophen, albuterol, ondansetron, oxymetazoline, polyethylene glycol  Assessment/ Plan:  Patient Profile: The patient is a72 y.o.year-old female w/ a PMHx notable for insulin dependent DMII, CAD, MDD, MAD, esophageal stricture, hiatal hernia/GERD, HTN, HLD, diastolic CHF, chronic hypoxic respiratory failure on home O2 @ 2L via [place] and a seizure disorder last admitted in Janurary of 2355 for a complicated UTI, and presented for this hospitalization from [facility] forprogressively  worseningencephalopathy and increased creatinine. Upon admission she was noted to have a SCr of 4.7, potassium 6.8, Hgb 7.9 (down from 10.4 5 months prior). Scr improved since admission, Hgb stable.   A/P: 1.Renal-Likely Acute renal injury 2/2 to contrast toxicity and hypotension. Renal function improving but with minimal urinary output prior to diuretics. CMP in process. Continue diuretics if Cr improved -Continue strict I/O's -Continue Daily weights -Daily BMP's to monitor renal function   Avoid nephrotoxins and contrast  2. Hypertension/volume - BP is soft. Will monitor. UA w/ protein no indication  of UTI. Patient with 5.7L output last 24 hours. Will monitor and consider repeating lasix at a lower dose if her Cr is stable.   Anticipate this represents diuretic phase ATN  3. Hyperkalemia - Resolved. Most likely secondary to oral potassium supplementation, ARB and renal excretion impairment.    No ARB for now  4. Anemia -Microcytic anemia of 9.1. No signs of acute blood loss.  5. Acute encephalopathy - Unclear etiology. Differential includes micro atherosclerotic embolic CVA from the recent cardiac cath, dementia, metabolic disorder but no clear indication that her renal dysfunction is directly related. CT brain without clear evidence of CVA.  Neurology following.   Looks like recovering ATN at this time and anticipate recovery   Follow up will depend on baseline creatinine   LOS: 4 Kathi Ludwig, MD PGY-2, Internal Medicine Resident

## 2017-07-26 ENCOUNTER — Inpatient Hospital Stay (HOSPITAL_COMMUNITY): Payer: Medicare Other

## 2017-07-26 DIAGNOSIS — I25118 Atherosclerotic heart disease of native coronary artery with other forms of angina pectoris: Secondary | ICD-10-CM

## 2017-07-26 DIAGNOSIS — E785 Hyperlipidemia, unspecified: Secondary | ICD-10-CM

## 2017-07-26 LAB — BASIC METABOLIC PANEL
Anion gap: 10 (ref 5–15)
BUN: 31 mg/dL — AB (ref 8–23)
CALCIUM: 8.5 mg/dL — AB (ref 8.9–10.3)
CO2: 26 mmol/L (ref 22–32)
CREATININE: 1.57 mg/dL — AB (ref 0.44–1.00)
Chloride: 107 mmol/L (ref 98–111)
GFR calc Af Amer: 37 mL/min — ABNORMAL LOW (ref >60)
GFR calc non Af Amer: 32 mL/min — ABNORMAL LOW (ref >60)
GLUCOSE: 119 mg/dL — AB (ref 70–99)
Potassium: 3.5 mmol/L (ref 3.5–5.1)
Sodium: 143 mmol/L (ref 135–145)

## 2017-07-26 LAB — GLUCOSE, CAPILLARY
GLUCOSE-CAPILLARY: 174 mg/dL — AB (ref 70–99)
Glucose-Capillary: 120 mg/dL — ABNORMAL HIGH (ref 70–99)
Glucose-Capillary: 149 mg/dL — ABNORMAL HIGH (ref 70–99)
Glucose-Capillary: 230 mg/dL — ABNORMAL HIGH (ref 70–99)

## 2017-07-26 MED ORDER — FUROSEMIDE 40 MG PO TABS
20.0000 mg | ORAL_TABLET | Freq: Every day | ORAL | Status: DC
  Administered 2017-07-26 – 2017-07-27 (×2): 20 mg via ORAL
  Filled 2017-07-26 (×2): qty 1

## 2017-07-26 NOTE — Progress Notes (Signed)
[name], patient's daughter,she was asking the nurse ,how is her medical status and all the result of the test that was ordered yesterday.Nurse informed her that MRI hasn't done yesterday becouse they were not sure if the patient has any implanted metal in her body and the nurse her about this.[name] said ''she does not have any metal in her body''.M.R.I. Informed.

## 2017-07-26 NOTE — Progress Notes (Addendum)
[facility] ROUNDING NOTE   Subjective:   Interval History: has no complaint today.  Objective:  Vital signs in last 24 hours:  Temp:  [97.5 F (36.4 C)-98.9 F (37.2 C)] 97.5 F (36.4 C) (07/23 0937) Pulse Rate:  [70-90] 87 (07/23 0937) Resp:  [18-22] 22 (07/23 0937) BP: (138-147)/(67-75) 138/75 (07/23 0937) SpO2:  [97 %-100 %] 100 % (07/23 0937) Weight:  [247 lb 2.2 oz (112.1 kg)] 247 lb 2.2 oz (112.1 kg) (07/22 2242)  Weight change:  Filed Weights   07/22/17 1126 07/25/17 2242  Weight: 253 lb 4.9 oz (114.9 kg) 247 lb 2.2 oz (112.1 kg)    Intake/Output: I/O last 3 completed shifts: In: 1020 [P.O.:1020] Out: 2500 [Urine:2500]   Intake/Output this shift:  Total I/O In: 120 [P.O.:120] Out: 150 [Urine:150]  Physical Exam: General: A/Ox4, in no acute distress, afebrile, nondiaphoretic. CVS- RRR, G3 systolic murmur RS- CTA Bilaterally  ABD- BS present soft non-distended EXT- no edema   Basic Metabolic Panel: Recent Labs  Lab 07/21/17 1524  07/22/17 0119 07/23/17 0746 07/24/17 0639 07/25/17 1015 07/26/17 0522  NA 136   < > 139 143 145 143 143  K 6.8*   < > 6.1* 5.8* 4.6 4.1 3.5  CL 101   < > 102 109 112* 107 107  CO2 26   < > 26 21* 24 24 26   GLUCOSE 78   < > 124* 115* 99 153* 119*  BUN 57*   < > 58* 54* 43* 37* 31*  CREATININE 4.70*   < > 4.67* 3.79* 2.66* 1.89* 1.57*  CALCIUM 8.7*   < > 8.8* 8.5* 8.4* 8.8* 8.5*  MG 2.1  --   --   --   --   --   --   PHOS 6.8*  --   --   --   --   --   --    < > = values in this interval not displayed.    Liver Function Tests: Recent Labs  Lab 07/21/17 1524 07/25/17 1015  AST 65* 48*  ALT 26 24  ALKPHOS 80 80  BILITOT 0.4 1.0  PROT 6.5 7.0  ALBUMIN 3.2* 3.2*   Recent Labs  Lab 07/21/17 1524  LIPASE 25   Recent Labs  Lab 07/22/17 1406  AMMONIA 22    CBC: Recent Labs  Lab 07/21/17 1524 07/21/17 1604 07/22/17 0119 07/23/17 0746 07/24/17 0639 07/25/17 1015  WBC 6.0  --  6.2 6.4  6.4 7.2  NEUTROABS 4.3  --   --   --   --   --   HGB 8.0* 8.8* 7.9* 9.9* 9.1* 10.8*  HCT 26.9* 26.0* 25.9* 32.3* 30.4* 36.1  MCV 97.8  --  96.3 95.6 96.8 96.3  PLT 179  --  177 184 180 220    Cardiac Enzymes: Recent Labs  Lab 07/22/17 0655 07/23/17 0746 07/24/17 0639 07/25/17 1015  CKTOTAL 1,270* 814* 465* 168    BNP: Invalid input(s): POCBNP  CBG: Recent Labs  Lab 07/25/17 0813 07/25/17 1126 07/25/17 1651 07/25/17 2238 07/26/17 0744  GLUCAP 85 150* 138* 138* 120*    Microbiology: Results for orders placed or performed during the hospital encounter of 07/21/17  MRSA PCR Screening     Status: Abnormal   Collection Time: 07/21/17  8:12 PM  Result Value Ref Range Status   MRSA by PCR POSITIVE (A) NEGATIVE Final    Comment:        The GeneXpert MRSA Assay (  FDA approved for NASAL specimens only), is one component of a comprehensive MRSA colonization surveillance program. It is not intended to diagnose MRSA infection nor to guide or monitor treatment for MRSA infections. RESULT CALLED TO, READ BACK BY AND VERIFIED WITH: E CASTRO RN 07/21/17 2316 JDW Performed at [facility], [place] [address], [place], [place] [address]   Urine Culture     Status: Abnormal   Collection Time: 07/22/17  6:39 AM  Result Value Ref Range Status   Specimen Description URINE, RANDOM  Final   Special Requests   Final    NONE Performed at [facility], [place] [address], [place], [place] [address]    Culture >=100,000 COLONIES/mL KLEBSIELLA PNEUMONIAE (A)  Final   Report Status 07/25/2017 FINAL  Final   Organism ID, Bacteria KLEBSIELLA PNEUMONIAE (A)  Final      Susceptibility   Klebsiella pneumoniae - MIC*    AMPICILLIN RESISTANT Resistant     CEFAZOLIN <=4 SENSITIVE Sensitive     CEFTRIAXONE <=1 SENSITIVE Sensitive     CIPROFLOXACIN <=0.25 SENSITIVE Sensitive     GENTAMICIN <=1 SENSITIVE Sensitive     IMIPENEM <=0.25 SENSITIVE Sensitive     NITROFURANTOIN 64  INTERMEDIATE Intermediate     TRIMETH/SULFA >=320 RESISTANT Resistant     AMPICILLIN/SULBACTAM 4 SENSITIVE Sensitive     PIP/TAZO <=4 SENSITIVE Sensitive     Extended ESBL NEGATIVE Sensitive     * >=100,000 COLONIES/mL KLEBSIELLA PNEUMONIAE    Coagulation Studies: No results for input(s): LABPROT, INR in the last 72 hours.  Urinalysis: No results for input(s): COLORURINE, LABSPEC, PHURINE, GLUCOSEU, HGBUR, BILIRUBINUR, KETONESUR, PROTEINUR, UROBILINOGEN, NITRITE, LEUKOCYTESUR in the last 72 hours.  Invalid input(s): APPERANCEUR    Imaging: No results found.   Medications:   . albuterol 10 mg/hr (07/21/17 1655)   . ALPRAZolam  0.5 mg Oral BID  . aspirin  325 mg Oral Daily  . Chlorhexidine Gluconate Cloth  6 each Topical Q0600  . enoxaparin (LOVENOX) injection  40 mg Subcutaneous Q24H  . furosemide  20 mg Oral Daily  . insulin aspart  0-5 Units Subcutaneous QHS  . insulin aspart  0-9 Units Subcutaneous TID WC  . isosorbide mononitrate  30 mg Oral Daily  . levETIRAcetam  1,000 mg Oral BID  . mouth rinse  15 mL Mouth Rinse BID  . metoprolol tartrate  25 mg Oral Daily  . mupirocin ointment  1 application Nasal BID  . prasugrel  10 mg Oral Daily   acetaminophen, albuterol, ondansetron, oxymetazoline, polyethylene glycol  Assessment/ Plan:  Patient Profile: The patient is a72 y.o.year-old female w/ a PMHx notable for insulin dependent DMII, CAD, MDD, MAD, esophageal stricture, hiatal hernia/GERD, HTN, HLD, diastolic CHF, chronic hypoxic respiratory failure on home O2 @ 2L via [place] and a seizure disorder last admitted in Janurary of 7619 for a complicated UTI, and presented for this hospitalization from [facility] forprogressively worseningencephalopathy and increased creatinine. Upon admission she was noted to have a SCr of 4.7, potassium 6.8, Hgb 7.9 (down from 10.4 5 months prior). Scr improved since admission, Hgb stable.  A/P: 1.Renal-Likely Acute renal  injury 2/2 to contrast toxicity and hypotension.  -Patient improving. Cr 1.57 this am. Will start low dose Furosemide 20mg  PO daily.  Nephrology will sign off. Please re-consult if any need arises. Thank you for the interesting consult.  2. Hypertension/volume - BPis soft.Will monitor. Output greatly decreased the prior day. Will resume diuretics gently.  3.  Hyperkalemia - Resolved. Continue to hold ARB 4. Anemia -Microcytic anemia of9.1. No signs of acute blood loss again today. 5. Acute encephalopathy - Unclear etiology but gradually improving. MRI ordered to evaluate for CVA.  Please feel free to re consult  It does not appear that the patient is going to need outpatient nephrology follow up and will defer this decision to PCP    LOS: 5 Kathi Ludwig, MD PGY-2, Internal Medicine Resident

## 2017-07-26 NOTE — Evaluation (Signed)
Physical Therapy Evaluation Patient Details Name: [name] MRN: [id number] DOB: 1945-01-10 Today's Date: 07/26/2017   History of Present Illness  Patient is a 72 year old female with IDDM, CAD, depression, anxiety, hiatal hernia/GERD, hypertension, hyperlipidemia, diastolic CHF, chronic hypoxic respiratory failure on home O2 2 L, seizure disorder who was admitted in January 8841 for complicated UTI, presented with acute mental status changes from [facility] skilled nursing facility.   Clinical Impression   Pt admitted with above diagnosis. Pt currently with functional limitations due to the deficits listed below (see PT Problem List). Reports was working on walking prior to admission;  Presents with decr functional mobility; Pt will benefit from skilled PT to increase their independence and safety with mobility to allow discharge to the venue listed below.       Follow Up Recommendations SNF;Other (comment)(to finish rehabilitation)    Equipment Recommendations  Rolling walker with 5" wheels;3in1 (PT)(short, bariatric)    Recommendations for Other Services       Precautions / Restrictions Precautions Precautions: Fall      Mobility  Bed Mobility Overal bed mobility: Needs Assistance Bed Mobility: Supine to Sit;Sit to Supine     Supine to sit: Mod assist Sit to supine: Mod assist   General bed mobility comments: Mod assist to help elevate trunk to sit; Mod assist to help LEs back on to bed  Transfers Overall transfer level: Needs assistance Equipment used: 1 person hand held assist Transfers: Sit to/from [facility] Sit to Stand: Mod assist Stand pivot transfers: Mod assist       General transfer comment: Light Mod assist to power up to stand; Good initiation with anterior weight shift; Slow moving with pivot steps to chair (and then back to bed because she will be going to MRI soon); Slow moving and precarious steps  Ambulation/Gait                 Stairs            Wheelchair Mobility    Modified Rankin (Stroke Patients Only)       Balance Overall balance assessment: Needs assistance   Sitting balance-Leahy Scale: Fair       Standing balance-Leahy Scale: Poor                               Pertinent Vitals/Pain Pain Assessment: No/denies pain    Home Living Family/patient expects to be discharged to:: Skilled nursing facility Living Arrangements: Spouse/significant other Available Help at Discharge: Family;Available 24 hours/day Type of Home: House Home Access: Level entry     Home Layout: One level Home Equipment: Cane - single point;Walker - 2 wheels;Wheelchair - manual;Shower seat;Bedside commode      Prior Function Level of Independence: Needs assistance         Comments: Admitted with encephalopathy, and pleasantly confused at time of PT eval; Unsure of the reliability of pt report at this time; Noted as of January of this year, she was independent with assistive device     Hand Dominance   Dominant Hand: Right    Extremity/Trunk Assessment   Upper Extremity Assessment Upper Extremity Assessment: Generalized weakness    Lower Extremity Assessment Lower Extremity Assessment: Generalized weakness(bil hip ROM grossly limited by body habitus)       Communication   Communication: No difficulties  Cognition Arousal/Alertness: Awake/alert Behavior During Therapy: WFL for tasks assessed/performed Overall Cognitive Status: No family/caregiver present to determine  baseline cognitive functioning                                 General Comments: Pleasant and following commands well; Noting decr awareness of safety and deficits      General Comments General comments (skin integrity, edema, etc.): Session conducted on 2 L suplemental O2    Exercises     Assessment/Plan    PT Assessment Patient needs continued PT services  PT Problem List Decreased  strength;Decreased range of motion;Decreased activity tolerance;Decreased balance;Decreased mobility;Decreased coordination;Decreased cognition;Decreased knowledge of use of DME;Decreased safety awareness;Decreased knowledge of precautions;Cardiopulmonary status limiting activity       PT Treatment Interventions DME instruction;Gait training;Functional mobility training;Therapeutic activities;Therapeutic exercise;Balance training;Patient/family education;Cognitive remediation    PT Goals (Current goals can be found in the Care Plan section)  Acute Rehab PT Goals Patient Stated Goal: wants to walk PT Goal Formulation: With patient Time For Goal Achievement: 08/09/17 Potential to Achieve Goals: Fair    Frequency Min 2X/week   Barriers to discharge        Co-evaluation               AM-PAC PT "6 Clicks" Daily Activity  Outcome Measure Difficulty turning over in bed (including adjusting bedclothes, sheets and blankets)?: A Lot Difficulty moving from lying on back to sitting on the side of the bed? : Unable Difficulty sitting down on and standing up from a chair with arms (e.g., wheelchair, bedside commode, etc,.)?: Unable Help needed moving to and from a bed to chair (including a wheelchair)?: A Lot Help needed walking in hospital room?: A Lot Help needed climbing 3-5 steps with a railing? : Total 6 Click Score: 9    End of Session   Activity Tolerance: Patient tolerated treatment well Patient left: in bed;with call bell/phone within reach;with bed alarm set Nurse Communication: Mobility status PT Visit Diagnosis: Unsteadiness on feet (R26.81);Other abnormalities of gait and mobility (R26.89);Muscle weakness (generalized) (M62.81)    Time: 8786-7672 PT Time Calculation (min) (ACUTE ONLY): 20 min   Charges:   PT Evaluation $PT Eval Moderate Complexity: 1 Mod     PT G Codes:        [name], PT  Acute Rehabilitation Services Pager [phone] Office  [phone]   [place] 07/26/2017, 2:32 PM

## 2017-07-26 NOTE — Progress Notes (Signed)
Patient trying to get out of bed unassisted. Patient stated that staff is trying to kill her. Explained to patient that nobody here is  to hurt her.  Patient stated "get the fuck out of my room." Patient is very irritated. Refused her Xanax this evening.

## 2017-07-26 NOTE — Clinical Social Work Note (Signed)
Patient from [facility] and will return to facility on 7/24 per MD. Contact made with admissions director at [facility] and update provided regarding discharge. CSW will continue to follow and facilitate discharge back to skilled nursing facility on 7/24.  Daly Whipkey Givens, MSW, LCSW Licensed Clinical Social Worker [place] [phone]

## 2017-07-26 NOTE — Progress Notes (Signed)
[facility]                                                                              Patient Demographics  [name], is a 72 y.o. female, DOB - 03-17-1945, GNO:[id number]  Admit date - 07/21/2017   Admitting Physician Guilford Shi, MD  Outpatient Primary MD for the patient is Briscoe Deutscher, DO  Outpatient specialists:   LOS - 5  days   Medical records reviewed and are as summarized below:    Chief Complaint  Patient presents with  . Abnormal Lab       Brief summary   Patient is a 72 year old female with IDDM, CAD, depression, anxiety, hiatal hernia/GERD, hypertension, hyperlipidemia, diastolic CHF, chronic hypoxic respiratory failure on home O2 2 L, seizure disorder who was admitted in January 1694 for complicated UTI, presented with acute mental status changes from [place] skilled nursing facility.  Patient was admitted in 06/2017 at [facility] for ACS and underwent cardiac cath.  Patient also noted to be on multiple sedatives at baseline and Xanax.  Patient was receiving Lasix and potassium supplementation at skilled nursing facility.  She was started on Lasix 20 mg daily on 7/12 however received additional dose of 40 mg on 7/8 and 7/10.  At the time of discharge from [facility], she was on Lasix as needed only.  Creatinine at the time of discharge from [facility] hospital was 1.1.   Assessment & Plan    Principal Problem:   Acute metabolic encephalopathy - Multifactorial likely due to acute kidney injury, dehydration, polypharmacy. - Continue to hold narcotics, restarted Xanax to avoid benzodiazepine withdrawals - UA negative for UTI.  CT head negative for acute CVA. - Ammonia level normal, folate normal, B12 normal - neurology evaluation was done, EEG negative for acute seizure activity, MRI still pending -Mental status appears to be significantly improved, start physical therapy  Active  Problems: Acute kidney injury with hyperkalemia, rhabdomyolysis - Likely due to diuretics, dehydration.  UA negative for UTI. Cr 4.7 on admission -Renal ultrasound showed no acute findings or hydronephrosis -Creatinine improving 1.5 today, nephrology signed off   acute on chronic diastolic CHF due to volume overload, with wheezing and shortness of breath -Improving -Chest x-ray on 7/21 showed diffuse vascular congestion, with BNP 2128.9 -Lasix now decreased, 20 mg daily  Acute rhabdomyolysis -CK 1270, no report of any falls -> 168  -CK improving, continue to hold statin    Essential hypertension -BP currently stable, continue Imdur, beta-blocker    Hypothyroidism -TSH 1.2, continue Synthroid    Coronary artery disease of native artery of native heart with stable angina pectoris (HCC) -Continue aspirin, Effient, beta-blocker -Hold statin due to rhabdomyolysis    Diabetes mellitus type II, uncontrolled with hypoglycemia -CBG stable, continue sliding scale insulin  -   Hemoglobin A1c 7.9    dyslipidemia -Hold statin due to rhabdo    Morbid obesity ([place]) -BMI 46.3, will need diet and weight control  Chronic anemia  -Anemia panel consistent with anemia of chronic disease, hemoglobin improved 9.9 after 1 unit packed RBCs on 7/19  -  H/H stable   Code Status: Partial DVT Prophylaxis:  Lovenox Family Communication: Discussed in detail with patient's daughter on the phone  Disposition Plan: plan for SNF, MRI pending, PT pending  Time Spent in minutes 25 minutes  Procedures:  Renal ultrasound EEG normal  Consultants:   Nephrology  Neurology  Antimicrobials:      Medications  Scheduled Meds: . ALPRAZolam  0.5 mg Oral BID  . aspirin  325 mg Oral Daily  . Chlorhexidine Gluconate Cloth  6 each Topical Q0600  . enoxaparin (LOVENOX) injection  40 mg Subcutaneous Q24H  . furosemide  20 mg Oral Daily  . insulin aspart  0-5 Units Subcutaneous QHS  . insulin aspart   0-9 Units Subcutaneous TID WC  . isosorbide mononitrate  30 mg Oral Daily  . levETIRAcetam  1,000 mg Oral BID  . mouth rinse  15 mL Mouth Rinse BID  . metoprolol tartrate  25 mg Oral Daily  . mupirocin ointment  1 application Nasal BID  . prasugrel  10 mg Oral Daily   Continuous Infusions: . albuterol 10 mg/hr (07/21/17 1655)   PRN Meds:.acetaminophen, albuterol, ondansetron, oxymetazoline, polyethylene glycol   Antibiotics   Anti-infectives (From admission, onward)   None        Subjective:   [name] was seen and examined today.  Mental status much improved, alert and oriented, shortness of breath is improved.  No wheezing.  No fevers or chills, no acute complaints.   Objective:   Vitals:   07/25/17 1930 07/25/17 2242 07/26/17 0606 07/26/17 0937  BP: (!) 145/67 (!) 147/68 (!) 146/73 138/75  Pulse: 70 84 90 87  Resp: 18 (!) 22 (!) 22 (!) 22  Temp: 97.8 F (36.6 C) 98.9 F (37.2 C) 98.3 F (36.8 C) (!) 97.5 F (36.4 C)  TempSrc: Oral Oral Oral Oral  SpO2: 98% 97% 97% 100%  Weight:  112.1 kg (247 lb 2.2 oz)    Height:        Intake/Output Summary (Last 24 hours) at 07/26/2017 1248 Last data filed at 07/26/2017 0900 Gross per 24 hour  Intake 660 ml  Output 700 ml  Net -40 ml     Wt Readings from Last 3 Encounters:  07/25/17 112.1 kg (247 lb 2.2 oz)  03/22/17 104.1 kg (229 lb 9.6 oz)  01/21/17 105.7 kg (233 lb)     Exam    General: Alert and oriented x 3, NAD, states she is in the hospital, was able to tell the month of July  Eyes:   HEENT:  Atraumatic, normocephalic,  Cardiovascular: S1 S2 auscultated, . Regular rate and rhythm. No pedal edema b/l  Respiratory: Clear to auscultation bilaterally, no wheezing, rales or rhonchi  Gastrointestinal: Soft, nontender, nondistended, + bowel sounds  Ext: no pedal edema bilaterally  Neuro: no new deficits  Musculoskeletal: No digital cyanosis, clubbing  Skin: No rashes  Psych: mental status  much improved, pleasant and cooperative   Data Reviewed:  I have personally reviewed following labs and imaging studies  Micro Results Recent Results (from the past 240 hour(s))  MRSA PCR Screening     Status: Abnormal   Collection Time: 07/21/17  8:12 PM  Result Value Ref Range Status   MRSA by PCR POSITIVE (A) NEGATIVE Final    Comment:        The GeneXpert MRSA Assay (FDA approved for NASAL specimens only), is one component of a comprehensive MRSA colonization surveillance program. It is not intended to diagnose  MRSA infection nor to guide or monitor treatment for MRSA infections. RESULT CALLED TO, READ BACK BY AND VERIFIED WITH: E CASTRO RN 07/21/17 2316 JDW Performed at [facility], [place] [address], [place], [place] [address]   Urine Culture     Status: Abnormal   Collection Time: 07/22/17  6:39 AM  Result Value Ref Range Status   Specimen Description URINE, RANDOM  Final   Special Requests   Final    NONE Performed at [facility], [place] [address], [place], [place] [address]    Culture >=100,000 COLONIES/mL KLEBSIELLA PNEUMONIAE (A)  Final   Report Status 07/25/2017 FINAL  Final   Organism ID, Bacteria KLEBSIELLA PNEUMONIAE (A)  Final      Susceptibility   Klebsiella pneumoniae - MIC*    AMPICILLIN RESISTANT Resistant     CEFAZOLIN <=4 SENSITIVE Sensitive     CEFTRIAXONE <=1 SENSITIVE Sensitive     CIPROFLOXACIN <=0.25 SENSITIVE Sensitive     GENTAMICIN <=1 SENSITIVE Sensitive     IMIPENEM <=0.25 SENSITIVE Sensitive     NITROFURANTOIN 64 INTERMEDIATE Intermediate     TRIMETH/SULFA >=320 RESISTANT Resistant     AMPICILLIN/SULBACTAM 4 SENSITIVE Sensitive     PIP/TAZO <=4 SENSITIVE Sensitive     Extended ESBL NEGATIVE Sensitive     * >=100,000 COLONIES/mL KLEBSIELLA PNEUMONIAE    Radiology Reports Ct Head Wo Contrast  Result Date: 07/22/2017 CLINICAL DATA:  Altered level of consciousness, combative. History of hypertension, hyperlipidemia, seizure.  EXAM: CT HEAD WITHOUT CONTRAST TECHNIQUE: Contiguous axial images were obtained from the base of the skull through the vertex without intravenous contrast. COMPARISON:  CT HEAD August 29, 2014 FINDINGS: Large body habitus results in overall noisy image quality. Moderately motion degraded examination. BRAIN: No intraparenchymal hemorrhage, mass effect nor midline shift. The ventricles and sulci are normal for age. Patchy to confluent supratentorial white matter hypodensities. No acute large vascular territory infarcts. No abnormal extra-axial fluid collections. Basal cisterns are patent. VASCULAR: Moderate calcific atherosclerosis of the carotid siphons. SKULL: No skull fracture. No significant scalp soft tissue swelling. SINUSES/ORBITS: Confluent LEFT mastoid air cells with minimal soft tissue density. Mild paranasal sinus mucosal thickening. Hypoplastic frontal sinuses.The included ocular globes and orbital contents are non-suspicious. OTHER: Patient is edentulous. IMPRESSION: 1. Habitus and motion degraded examination. No acute intracranial process. 2. Moderate chronic small vessel ischemic changes. 3. Chronic coalescent LEFT mastoiditis. Electronically Signed   By: Elon Alas M.D.   On: 07/22/2017 14:00   US Renal  Result Date: 07/22/2017 CLINICAL DATA:  Acute kidney injury EXAM: RENAL / URINARY TRACT ULTRASOUND COMPLETE COMPARISON:  06/22/2017 FINDINGS: Right Kidney: Length: 10.1 cm. Difficult to visualize. No visible hydronephrosis. Normal echotexture. Left Kidney: Length: 9.8 cm. Echogenicity within normal limits. No mass or hydronephrosis visualized. Bladder: Appears normal for degree of bladder distention. IMPRESSION: No acute findings.  No hydronephrosis. Electronically Signed   By: Rolm Baptise M.D.   On: 07/22/2017 11:06   Dg Chest Port 1 View  Result Date: 07/24/2017 CLINICAL DATA:  Dyspnea EXAM: PORTABLE CHEST 1 VIEW COMPARISON:  07/21/2017 FINDINGS: Moderate cardiomegaly with diffuse  vascular congestion and interstitial prominence compatible with interstitial edema. Mild CHF suspected. No large effusion or pneumothorax. No focal pneumonia, collapse or consolidation. Trachea is midline. IMPRESSION: Mild CHF pattern Electronically Signed   By: Jerilynn Mages.  Shick M.D.   On: 07/24/2017 08:22   Dg Chest Port 1 View  Result Date: 07/21/2017 CLINICAL DATA:  Shortness of breath EXAM: PORTABLE  CHEST 1 VIEW COMPARISON:  06/20/2017 FINDINGS: Low lung volumes are present, causing crowding of the pulmonary vasculature. The patient is rotated to the right on today's radiograph, reducing diagnostic sensitivity and specificity. Suspected mild enlargement of the cardiopericardial silhouette vague prominence of the right hilum is likely attributable to the rightward rotation. IMPRESSION: 1. Low lung volumes are present, causing crowding of the pulmonary vasculature. 2. Mild enlargement of the cardiopericardial silhouette. 3. Reduced exam sensitivity due to rightward rotation. Electronically Signed   By: Van Clines M.D.   On: 07/21/2017 15:53    Lab Data:  CBC: Recent Labs  Lab 07/21/17 1524 07/21/17 1604 07/22/17 0119 07/23/17 0746 07/24/17 0639 07/25/17 1015  WBC 6.0  --  6.2 6.4 6.4 7.2  NEUTROABS 4.3  --   --   --   --   --   HGB 8.0* 8.8* 7.9* 9.9* 9.1* 10.8*  HCT 26.9* 26.0* 25.9* 32.3* 30.4* 36.1  MCV 97.8  --  96.3 95.6 96.8 96.3  PLT 179  --  177 184 180 774   Basic Metabolic Panel: Recent Labs  Lab 07/21/17 1524  07/22/17 0119 07/23/17 0746 07/24/17 0639 07/25/17 1015 07/26/17 0522  NA 136   < > 139 143 145 143 143  K 6.8*   < > 6.1* 5.8* 4.6 4.1 3.5  CL 101   < > 102 109 112* 107 107  CO2 26   < > 26 21* 24 24 26   GLUCOSE 78   < > 124* 115* 99 153* 119*  BUN 57*   < > 58* 54* 43* 37* 31*  CREATININE 4.70*   < > 4.67* 3.79* 2.66* 1.89* 1.57*  CALCIUM 8.7*   < > 8.8* 8.5* 8.4* 8.8* 8.5*  MG 2.1  --   --   --   --   --   --   PHOS 6.8*  --   --   --   --   --   --     < > = values in this interval not displayed.   GFR: Estimated Creatinine Clearance: 38.3 mL/min (A) (by C-G formula based on SCr of 1.57 mg/dL (H)). Liver Function Tests: Recent Labs  Lab 07/21/17 1524 07/25/17 1015  AST 65* 48*  ALT 26 24  ALKPHOS 80 80  BILITOT 0.4 1.0  PROT 6.5 7.0  ALBUMIN 3.2* 3.2*   Recent Labs  Lab 07/21/17 1524  LIPASE 25   Recent Labs  Lab 07/22/17 1406  AMMONIA 22   Coagulation Profile: Recent Labs  Lab 07/21/17 1524  INR 1.24   Cardiac Enzymes: Recent Labs  Lab 07/22/17 0655 07/23/17 0746 07/24/17 0639 07/25/17 1015  CKTOTAL 1,270* 814* 465* 168   BNP (last 3 results) No results for input(s): PROBNP in the last 8760 hours. HbA1C: No results for input(s): HGBA1C in the last 72 hours. CBG: Recent Labs  Lab 07/25/17 1126 07/25/17 1651 07/25/17 2238 07/26/17 0744 07/26/17 1140  GLUCAP 150* 138* 138* 120* 174*   Lipid Profile: No results for input(s): CHOL, HDL, LDLCALC, TRIG, CHOLHDL, LDLDIRECT in the last 72 hours. Thyroid Function Tests: No results for input(s): TSH, T4TOTAL, FREET4, T3FREE, THYROIDAB in the last 72 hours. Anemia Panel: No results for input(s): VITAMINB12, FOLATE, FERRITIN, TIBC, IRON, RETICCTPCT in the last 72 hours. Urine analysis:    Component Value Date/Time   COLORURINE YELLOW 07/23/2017 1142   APPEARANCEUR CLOUDY (A) 07/23/2017 1142   LABSPEC 1.016 07/23/2017 1142   PHURINE 5.0 07/23/2017 1142  GLUCOSEU NEGATIVE 07/23/2017 1142   HGBUR SMALL (A) 07/23/2017 1142   BILIRUBINUR NEGATIVE 07/23/2017 1142   BILIRUBINUR Negative 01/21/2017 1044   KETONESUR NEGATIVE 07/23/2017 1142   PROTEINUR 100 (A) 07/23/2017 1142   UROBILINOGEN 0.2 01/21/2017 1044   UROBILINOGEN 0.2 08/28/2014 1555   NITRITE NEGATIVE 07/23/2017 1142   LEUKOCYTESUR NEGATIVE 07/23/2017 1142     Loreen Bankson M.D. [facility] 07/26/2017, 12:48 PM  Pager: [phone] Between 7am to 7pm - call Pager -  [phone]  After 7pm go to [email or web address] - password [email or web address]  Call night coverage person covering after 7pm

## 2017-07-26 NOTE — Progress Notes (Signed)
Patient refusing most meds except her keppra.

## 2017-07-27 ENCOUNTER — Inpatient Hospital Stay (HOSPITAL_COMMUNITY): Payer: Medicare Other

## 2017-07-27 DIAGNOSIS — I6389 Other cerebral infarction: Secondary | ICD-10-CM | POA: Diagnosis not present

## 2017-07-27 DIAGNOSIS — I5032 Chronic diastolic (congestive) heart failure: Secondary | ICD-10-CM | POA: Diagnosis present

## 2017-07-27 DIAGNOSIS — G40909 Epilepsy, unspecified, not intractable, without status epilepticus: Secondary | ICD-10-CM | POA: Diagnosis present

## 2017-07-27 DIAGNOSIS — I2510 Atherosclerotic heart disease of native coronary artery without angina pectoris: Secondary | ICD-10-CM | POA: Diagnosis present

## 2017-07-27 DIAGNOSIS — I509 Heart failure, unspecified: Secondary | ICD-10-CM | POA: Diagnosis not present

## 2017-07-27 DIAGNOSIS — I10 Essential (primary) hypertension: Secondary | ICD-10-CM | POA: Diagnosis not present

## 2017-07-27 DIAGNOSIS — E1122 Type 2 diabetes mellitus with diabetic chronic kidney disease: Secondary | ICD-10-CM | POA: Diagnosis present

## 2017-07-27 DIAGNOSIS — Z7902 Long term (current) use of antithrombotics/antiplatelets: Secondary | ICD-10-CM | POA: Diagnosis not present

## 2017-07-27 DIAGNOSIS — R4182 Altered mental status, unspecified: Secondary | ICD-10-CM | POA: Diagnosis not present

## 2017-07-27 DIAGNOSIS — I25118 Atherosclerotic heart disease of native coronary artery with other forms of angina pectoris: Secondary | ICD-10-CM | POA: Diagnosis not present

## 2017-07-27 DIAGNOSIS — I340 Nonrheumatic mitral (valve) insufficiency: Secondary | ICD-10-CM

## 2017-07-27 DIAGNOSIS — Z87891 Personal history of nicotine dependence: Secondary | ICD-10-CM | POA: Diagnosis not present

## 2017-07-27 DIAGNOSIS — Z8249 Family history of ischemic heart disease and other diseases of the circulatory system: Secondary | ICD-10-CM | POA: Diagnosis not present

## 2017-07-27 DIAGNOSIS — Z885 Allergy status to narcotic agent status: Secondary | ICD-10-CM | POA: Diagnosis not present

## 2017-07-27 DIAGNOSIS — R0902 Hypoxemia: Secondary | ICD-10-CM | POA: Diagnosis not present

## 2017-07-27 DIAGNOSIS — G8929 Other chronic pain: Secondary | ICD-10-CM | POA: Diagnosis not present

## 2017-07-27 DIAGNOSIS — Z90710 Acquired absence of both cervix and uterus: Secondary | ICD-10-CM | POA: Diagnosis not present

## 2017-07-27 DIAGNOSIS — F330 Major depressive disorder, recurrent, mild: Secondary | ICD-10-CM | POA: Diagnosis not present

## 2017-07-27 DIAGNOSIS — M6281 Muscle weakness (generalized): Secondary | ICD-10-CM | POA: Diagnosis not present

## 2017-07-27 DIAGNOSIS — I5023 Acute on chronic systolic (congestive) heart failure: Secondary | ICD-10-CM | POA: Diagnosis not present

## 2017-07-27 DIAGNOSIS — I214 Non-ST elevation (NSTEMI) myocardial infarction: Secondary | ICD-10-CM | POA: Diagnosis present

## 2017-07-27 DIAGNOSIS — R41841 Cognitive communication deficit: Secondary | ICD-10-CM | POA: Diagnosis not present

## 2017-07-27 DIAGNOSIS — M6282 Rhabdomyolysis: Secondary | ICD-10-CM | POA: Diagnosis not present

## 2017-07-27 DIAGNOSIS — K219 Gastro-esophageal reflux disease without esophagitis: Secondary | ICD-10-CM | POA: Diagnosis present

## 2017-07-27 DIAGNOSIS — R079 Chest pain, unspecified: Secondary | ICD-10-CM | POA: Diagnosis not present

## 2017-07-27 DIAGNOSIS — R2689 Other abnormalities of gait and mobility: Secondary | ICD-10-CM | POA: Diagnosis not present

## 2017-07-27 DIAGNOSIS — I639 Cerebral infarction, unspecified: Secondary | ICD-10-CM | POA: Diagnosis not present

## 2017-07-27 DIAGNOSIS — I6330 Cerebral infarction due to thrombosis of unspecified cerebral artery: Secondary | ICD-10-CM

## 2017-07-27 DIAGNOSIS — I252 Old myocardial infarction: Secondary | ICD-10-CM | POA: Diagnosis not present

## 2017-07-27 DIAGNOSIS — E875 Hyperkalemia: Secondary | ICD-10-CM | POA: Diagnosis not present

## 2017-07-27 DIAGNOSIS — G9341 Metabolic encephalopathy: Secondary | ICD-10-CM

## 2017-07-27 DIAGNOSIS — F418 Other specified anxiety disorders: Secondary | ICD-10-CM | POA: Diagnosis not present

## 2017-07-27 DIAGNOSIS — Z825 Family history of asthma and other chronic lower respiratory diseases: Secondary | ICD-10-CM | POA: Diagnosis not present

## 2017-07-27 DIAGNOSIS — F0390 Unspecified dementia without behavioral disturbance: Secondary | ICD-10-CM | POA: Diagnosis not present

## 2017-07-27 DIAGNOSIS — I130 Hypertensive heart and chronic kidney disease with heart failure and stage 1 through stage 4 chronic kidney disease, or unspecified chronic kidney disease: Secondary | ICD-10-CM | POA: Diagnosis present

## 2017-07-27 DIAGNOSIS — E039 Hypothyroidism, unspecified: Secondary | ICD-10-CM | POA: Diagnosis not present

## 2017-07-27 DIAGNOSIS — Z841 Family history of disorders of kidney and ureter: Secondary | ICD-10-CM | POA: Diagnosis not present

## 2017-07-27 DIAGNOSIS — Z6841 Body Mass Index (BMI) 40.0 and over, adult: Secondary | ICD-10-CM | POA: Diagnosis not present

## 2017-07-27 DIAGNOSIS — R531 Weakness: Secondary | ICD-10-CM | POA: Diagnosis present

## 2017-07-27 DIAGNOSIS — F329 Major depressive disorder, single episode, unspecified: Secondary | ICD-10-CM | POA: Diagnosis present

## 2017-07-27 DIAGNOSIS — Z833 Family history of diabetes mellitus: Secondary | ICD-10-CM | POA: Diagnosis not present

## 2017-07-27 DIAGNOSIS — E119 Type 2 diabetes mellitus without complications: Secondary | ICD-10-CM | POA: Diagnosis not present

## 2017-07-27 DIAGNOSIS — E785 Hyperlipidemia, unspecified: Secondary | ICD-10-CM | POA: Diagnosis present

## 2017-07-27 DIAGNOSIS — N183 Chronic kidney disease, stage 3 (moderate): Secondary | ICD-10-CM | POA: Diagnosis present

## 2017-07-27 DIAGNOSIS — D631 Anemia in chronic kidney disease: Secondary | ICD-10-CM | POA: Diagnosis present

## 2017-07-27 DIAGNOSIS — N179 Acute kidney failure, unspecified: Secondary | ICD-10-CM | POA: Diagnosis not present

## 2017-07-27 DIAGNOSIS — R278 Other lack of coordination: Secondary | ICD-10-CM | POA: Diagnosis not present

## 2017-07-27 DIAGNOSIS — I350 Nonrheumatic aortic (valve) stenosis: Secondary | ICD-10-CM | POA: Diagnosis not present

## 2017-07-27 DIAGNOSIS — D649 Anemia, unspecified: Secondary | ICD-10-CM | POA: Diagnosis not present

## 2017-07-27 DIAGNOSIS — R0789 Other chest pain: Secondary | ICD-10-CM | POA: Diagnosis present

## 2017-07-27 DIAGNOSIS — F419 Anxiety disorder, unspecified: Secondary | ICD-10-CM | POA: Diagnosis present

## 2017-07-27 DIAGNOSIS — G4089 Other seizures: Secondary | ICD-10-CM | POA: Diagnosis not present

## 2017-07-27 LAB — LIPID PANEL
CHOL/HDL RATIO: 3.3 ratio
Cholesterol: 122 mg/dL (ref 0–200)
HDL: 37 mg/dL — AB (ref >40)
LDL CALC: 72 mg/dL (ref 0–99)
Triglycerides: 64 mg/dL (ref <150)
VLDL: 13 mg/dL (ref 0–40)

## 2017-07-27 LAB — BASIC METABOLIC PANEL
Anion gap: 14 (ref 5–15)
BUN: 25 mg/dL — AB (ref 8–23)
CO2: 23 mmol/L (ref 22–32)
CREATININE: 1.22 mg/dL — AB (ref 0.44–1.00)
Calcium: 8.4 mg/dL — ABNORMAL LOW (ref 8.9–10.3)
Chloride: 109 mmol/L (ref 98–111)
GFR calc non Af Amer: 43 mL/min — ABNORMAL LOW (ref >60)
GFR, EST AFRICAN AMERICAN: 50 mL/min — AB (ref >60)
GLUCOSE: 162 mg/dL — AB (ref 70–99)
Potassium: 3.6 mmol/L (ref 3.5–5.1)
Sodium: 146 mmol/L — ABNORMAL HIGH (ref 135–145)

## 2017-07-27 LAB — ECHOCARDIOGRAM COMPLETE
HEIGHTINCHES: 62 in
Weight: 3954.17 oz

## 2017-07-27 LAB — GLUCOSE, CAPILLARY
Glucose-Capillary: 152 mg/dL — ABNORMAL HIGH (ref 70–99)
Glucose-Capillary: 148 mg/dL — ABNORMAL HIGH (ref 70–99)
Glucose-Capillary: 118 mg/dL — ABNORMAL HIGH (ref 70–99)

## 2017-07-27 MED ORDER — ALBUTEROL SULFATE (2.5 MG/3ML) 0.083% IN NEBU: 2.5000 mg | INHALATION_SOLUTION | RESPIRATORY_TRACT | 12 refills | Status: DC | PRN

## 2017-07-27 MED ORDER — PERFLUTREN LIPID MICROSPHERE
1.0000 mL | INTRAVENOUS | Status: AC | PRN
  Administered 2017-07-27: 2 mL via INTRAVENOUS
  Filled 2017-07-27: qty 10

## 2017-07-27 MED ORDER — CLOPIDOGREL BISULFATE 75 MG PO TABS: 75.0000 mg | ORAL_TABLET | Freq: Every day | ORAL | Status: DC

## 2017-07-27 MED ORDER — ALPRAZOLAM 0.5 MG PO TABS: 0.5000 mg | ORAL_TABLET | Freq: Two times a day (BID) | ORAL | 0 refills | Status: DC

## 2017-07-27 MED ORDER — INSULIN ASPART 100 UNIT/ML ~~LOC~~ SOLN: 0.0000 [IU] | Freq: Three times a day (TID) | SUBCUTANEOUS | 11 refills | Status: DC

## 2017-07-27 NOTE — Discharge Summary (Signed)
Physician Discharge Summary   Patient ID: [name] MRN: [id number] DOB/AGE: [birth year] 72 y.o.  Admit date: 07/21/2017 Discharge date: 07/27/2017  Primary Care Physician:  Briscoe Deutscher, DO   Recommendations for Outpatient Follow-up:  1. Follow up with PCP in 1-2 weeks 2. Patient may be restarted on ACE inhibitor once creatinine function is normalized 3. Please check LFTs and CK in 1 month, patient had rhabdomyolysis, statin was held.  Crestor restarted once CK normalized.  Home Health: None, patient is discharging to skilled nursing facility Equipment/Devices:   Discharge Condition: stable  CODE STATUS: Partial, okay to perform CPR, BiPAP, ACLS or defibrillation/cardioversion but no intubation Diet recommendation: Dysphagia 3 diet   Discharge Diagnoses:    . Acute renal failure (ARF) (HCC)  Acute/subacute lacunar infarcts   Acute kidney injury  Hyperkalemia  Rhabdomyolysis  Acute on chronic diastolic CHF  Hypothyroidism . Acute metabolic encephalopathy . Coronary artery disease of native artery of native heart with stable angina pectoris ([place]) . Dyslipidemia . Essential hypertension . Hypothyroidism . Morbid obesity ([place]) . Chronic normocytic anemia   Consults:   Neurology Nephrology   Allergies:   Allergies  Allergen Reactions  . Codeine Itching     DISCHARGE MEDICATIONS: Allergies as of 07/27/2017      Reactions   Codeine Itching      Medication List    STOP taking these medications   gabapentin 300 MG capsule Commonly known as:  NEURONTIN   HYDROcodone-acetaminophen 5-325 MG tablet Commonly known as:  NORCO/VICODIN   hydrOXYzine 10 MG tablet Commonly known as:  ATARAX/VISTARIL   Insulin Detemir 100 UNIT/ML Pen Commonly known as:  LEVEMIR FLEXTOUCH   methocarbamol 500 MG tablet Commonly known as:  ROBAXIN   potassium chloride SA 20 MEQ tablet Commonly known as:  K-DUR,KLOR-CON   prasugrel 10 MG Tabs tablet Commonly known as:   EFFIENT   sertraline 100 MG tablet Commonly known as:  ZOLOFT   sulfamethoxazole-trimethoprim 800-160 MG tablet Commonly known as:  BACTRIM DS,SEPTRA DS   traMADol 50 MG tablet Commonly known as:  ULTRAM   valsartan 160 MG tablet Commonly known as:  DIOVAN     TAKE these medications   acetaminophen 325 MG tablet Commonly known as:  TYLENOL Take 2 tablets (650 mg total) by mouth every 4 (four) hours as needed for headache or mild pain.   albuterol (2.5 MG/3ML) 0.083% nebulizer solution Commonly known as:  PROVENTIL Take 3 mLs (2.5 mg total) by nebulization every 4 (four) hours as needed for wheezing or shortness of breath.   ALPRAZolam 0.5 MG tablet Commonly known as:  XANAX Take 1 tablet (0.5 mg total) by mouth 2 (two) times daily.   aspirin 325 MG EC tablet Take 1 tablet (325 mg total) by mouth daily.   clopidogrel 75 MG tablet Commonly known as:  PLAVIX Take 1 tablet (75 mg total) by mouth daily. Start taking on:  07/28/2017   furosemide 20 MG tablet Commonly known as:  LASIX Take 20 mg by mouth daily.   glucose blood test strip Commonly known as:  ONETOUCH VERIO 1 each by Other route daily. And lancets 1/day. Dx code E11.9.   insulin aspart 100 UNIT/ML injection Commonly known as:  novoLOG Inject 0-9 Units into the skin 3 (three) times daily with meals. Sliding scale CBG 70 - 120: 0 units CBG 121 - 150: 1 unit,  CBG 151 - 200: 2 units,  CBG 201 - 250: 3 units,  CBG 251 - 300: 5  units,  CBG 301 - 350: 7 units,  CBG 351 - 400: 9 units   CBG > 400: 9 units and notify your MD   isosorbide mononitrate 60 MG 24 hr tablet Commonly known as:  IMDUR Take 60 mg by mouth daily.   levETIRAcetam 1000 MG tablet Commonly known as:  KEPPRA Take 1 tablet (1,000 mg total) by mouth 2 (two) times daily.   metoprolol tartrate 50 MG tablet Commonly known as:  LOPRESSOR Take 50 mg by mouth daily.   ondansetron 4 MG tablet Commonly known as:  ZOFRAN Take 4 mg by mouth every 6  (six) hours as needed for nausea or vomiting.   oxymetazoline 0.05 % nasal spray Commonly known as:  AFRIN Place 1 spray into both nostrils 2 (two) times daily as needed (nosebleeds).   polyethylene glycol packet Commonly known as:  MIRALAX / GLYCOLAX Take 17 g by mouth daily as needed for mild constipation.   rizatriptan 10 MG tablet Commonly known as:  MAXALT Take 1 tablet (10 mg total) by mouth as needed for migraine. May repeat in 2 hours if needed What changed:    when to take this  reasons to take this  additional instructions   rosuvastatin 10 MG tablet Commonly known as:  CRESTOR Take 1 tablet (10 mg total) by mouth daily. What changed:  when to take this   sodium chloride 0.65 % Soln nasal spray Commonly known as:  OCEAN Place 2 sprays into both nostrils 3 (three) times daily.        Brief H and P: For complete details please refer to admission H and P, but in brief Patient is a 72 year old female with IDDM, CAD, depression, anxiety, hiatal hernia/GERD, hypertension, hyperlipidemia, diastolic CHF, chronic hypoxic respiratory failure on home O2 2 L, seizure disorder who was admitted in January 2025 for complicated UTI, presented with acute mental status changes from [facility] skilled nursing facility.  Patient was admitted in 06/2017 at [facility] for ACS and underwent cardiac cath.  Patient also noted to be on multiple sedatives at baseline and Xanax.  Patient was receiving Lasix and potassium supplementation at skilled nursing facility.  She was started on Lasix 20 mg daily on 7/12 however received additional dose of 40 mg on 7/8 and 7/10.  At the time of discharge from [facility], she was on Lasix as needed only.  Creatinine at the time of discharge from [facility] hospital was 1.1.  Hospital Course:   Acute metabolic encephalopathy/acute CVA -Acute metabolic encephalopathy with agitation and combativeness at the time of  admission was thought likely due to acute kidney injury, dehydration, polypharmacy. -Narcotics were held, patient was restarted on Xanax to avoid benzodiazepine withdrawals.   - UA negative for UTI.  CT head was negative for acute CVA. - Ammonia level normal, folate normal, B12 normal -Due to no significant improvement, neurology was consulted, MRI was ordered, EEG negative for acute seizure activity.   -MRI 7/23 showed no acute or subacute lacunar infarct in the bilateral corona radiator, question small subacute cortical infarct in the left parietal lobe -2D echo showed EF of 45 to 50%, mildly reduced, moderate aortic stenosis, no PFO -Carotid Dopplers showed bilateral ICA 1 to 39% stenosis -Lipid panel showed LDL 72, hemoglobin A1c 7.9 -Statin was held due to rhabdomyolysis, CK now improved, 168, restart Crestor -Currently on aspirin 325 mg daily, now added Plavix -PT eval recommended skilled nursing facility, SLP evaluation recommended dysphagia  3 diet - stroke service, Dr. Leonie Man felt that patient has incidental lacunar strokes which may not be the cause of the presenting acute metabolic encephalopathy at the time of admission. -However recommended, changing Effient to Plavix as Effient is contraindicated in the setting of acute stroke and place the patient on aspirin and Plavix. I called patient's cardiologist, Dr. Sherryl Barters at [facility] heart center however patient's cardiologist is not available today. On reviewing patient's record on care everywhere, patient underwent cardiac cath on 06/27/17, no change compared to angiogram from 2017, was recommended medical therapy by cardiology, patient had no PCI placed during this cardiac cath.  I spoke with Dr Donnetta Hutching (cardiology [place] [facility]) and reviewed the case, Dr. Donnetta Hutching recommended to change to dual antiplatelet therapy with aspirin and Plavix and DC Effient.   Acute kidney injury with hyperkalemia, rhabdomyolysis - Likely due  to diuretics, dehydration.  UA negative for UTI. Cr 4.7 on admission -Renal ultrasound showed no acute findings or hydronephrosis -Creatinine improving,1.57 at discharge  Acute on chronic diastolic CHF due to volume overload, with wheezing and shortness of breath - Improving, -Chest x-ray on 7/21 showed diffuse vascular congestion, with BNP 2128.9, initially required aggressive IV diuresis -Lasix now decreased, continue 20 mg daily  Acute rhabdomyolysis -CK 1270, no report of any falls -> 168  -Statin was held due to rhabdomyolysis, CK normalized, restart Crestor     Essential hypertension -BP currently stable, continue Imdur, beta-blocker    Hypothyroidism -TSH 1.2, continue Synthroid    Coronary artery disease of native artery of native heart with stable angina pectoris (HCC) -Continue aspirin, beta-blocker, placed on Plavix.  Continue Crestor    Diabetes mellitus type II, uncontrolled with hypoglycemia -CBG stable, continue sliding scale insulin  -   Hemoglobin A1c 7.9    dyslipidemia -Restart Crestor, check CK, LFTs in 1 month.    Morbid obesity ([place]) -BMI 46.3, will need diet and weight control  Chronic anemia  -Anemia panel consistent with anemia of chronic disease, hemoglobin improved 9.9 after 1 unit packed RBCs on 7/19  -H&H stable    Day of Discharge S: Doing well, no acute issues, much more alert and oriented  BP (!) 143/73 (BP Location: Right Arm)   Pulse 66   Temp 98.6 F (37 C) (Oral)   Resp 18   Ht 5\' 2"  (1.575 m)   Wt 112.1 kg (247 lb 2.2 oz)   SpO2 100%   BMI 45.20 kg/m   Physical Exam: General: Alert and awake oriented x3 not in any acute distress. HEENT: anicteric sclera, pupils reactive to light and accommodation CVS: S1-S2 clear no murmur rubs or gallops Chest: clear to auscultation bilaterally, no wheezing rales or rhonchi Abdomen: soft nontender, nondistended, normal bowel sounds Extremities: no cyanosis, clubbing or edema  noted bilaterally Neuro: no new deficits   The results of significant diagnostics from this hospitalization (including imaging, microbiology, ancillary and laboratory) are listed below for reference.      Procedures/Studies:  Ct Head Wo Contrast  Result Date: 07/22/2017 CLINICAL DATA:  Altered level of consciousness, combative. History of hypertension, hyperlipidemia, seizure. EXAM: CT HEAD WITHOUT CONTRAST TECHNIQUE: Contiguous axial images were obtained from the base of the skull through the vertex without intravenous contrast. COMPARISON:  CT HEAD August 29, 2014 FINDINGS: Large body habitus results in overall noisy image quality. Moderately motion degraded examination. BRAIN: No intraparenchymal hemorrhage, mass effect nor midline shift. The ventricles and sulci are normal for age. Patchy  to confluent supratentorial white matter hypodensities. No acute large vascular territory infarcts. No abnormal extra-axial fluid collections. Basal cisterns are patent. VASCULAR: Moderate calcific atherosclerosis of the carotid siphons. SKULL: No skull fracture. No significant scalp soft tissue swelling. SINUSES/ORBITS: Confluent LEFT mastoid air cells with minimal soft tissue density. Mild paranasal sinus mucosal thickening. Hypoplastic frontal sinuses.The included ocular globes and orbital contents are non-suspicious. OTHER: Patient is edentulous. IMPRESSION: 1. Habitus and motion degraded examination. No acute intracranial process. 2. Moderate chronic small vessel ischemic changes. 3. Chronic coalescent LEFT mastoiditis. Electronically Signed   By: Elon Alas M.D.   On: 07/22/2017 14:00   Mr Brain Wo Contrast  Result Date: 07/26/2017 CLINICAL DATA:  Altered level of consciousness. EXAM: MRI HEAD WITHOUT CONTRAST TECHNIQUE: Multiplanar, multiecho pulse sequences of the brain and surrounding structures were obtained without intravenous contrast. COMPARISON:  Head CT 07/22/2017.  Brain MRI 08/29/2014  FINDINGS: Partial study due to patient terminating scan before completion. Subcentimeter ovoid subjectively moderate restricted diffusion in the right centrum semiovale and left posterior centrum semiovale. The cortex of the left parietal lobe is slightly bright compared to the right on FLAIR and diffusion, suspicious for subacute infarct. 2 discrete areas of remote cortical infarction in the anterior right frontal lobe. There is moderate FLAIR hyperintensity in the periventricular white matter attributed to chronic small vessel ischemia. Cerebral volume loss. Only diffusion, FLAIR, and gradient sequences were acquired. IMPRESSION: 1. 2 acute or subacute lacunar infarcts in the bilateral corona radiata. 2. Question small subacute cortical infarct in the left parietal lobe. 3. Small remote right frontal cortex infarcts that have occurred since a 2016 comparison. 4. Partial study due to patient request, only 3 sequences were acquired. Electronically Signed   By: Monte Fantasia M.D.   On: 07/26/2017 16:11   US Renal  Result Date: 07/22/2017 CLINICAL DATA:  Acute kidney injury EXAM: RENAL / URINARY TRACT ULTRASOUND COMPLETE COMPARISON:  06/22/2017 FINDINGS: Right Kidney: Length: 10.1 cm. Difficult to visualize. No visible hydronephrosis. Normal echotexture. Left Kidney: Length: 9.8 cm. Echogenicity within normal limits. No mass or hydronephrosis visualized. Bladder: Appears normal for degree of bladder distention. IMPRESSION: No acute findings.  No hydronephrosis. Electronically Signed   By: Rolm Baptise M.D.   On: 07/22/2017 11:06   Dg Chest Port 1 View  Result Date: 07/24/2017 CLINICAL DATA:  Dyspnea EXAM: PORTABLE CHEST 1 VIEW COMPARISON:  07/21/2017 FINDINGS: Moderate cardiomegaly with diffuse vascular congestion and interstitial prominence compatible with interstitial edema. Mild CHF suspected. No large effusion or pneumothorax. No focal pneumonia, collapse or consolidation. Trachea is midline. IMPRESSION:  Mild CHF pattern Electronically Signed   By: Jerilynn Mages.  Shick M.D.   On: 07/24/2017 08:22   Dg Chest Port 1 View  Result Date: 07/21/2017 CLINICAL DATA:  Shortness of breath EXAM: PORTABLE CHEST 1 VIEW COMPARISON:  06/20/2017 FINDINGS: Low lung volumes are present, causing crowding of the pulmonary vasculature. The patient is rotated to the right on today's radiograph, reducing diagnostic sensitivity and specificity. Suspected mild enlargement of the cardiopericardial silhouette vague prominence of the right hilum is likely attributable to the rightward rotation. IMPRESSION: 1. Low lung volumes are present, causing crowding of the pulmonary vasculature. 2. Mild enlargement of the cardiopericardial silhouette. 3. Reduced exam sensitivity due to rightward rotation. Electronically Signed   By: Van Clines M.D.   On: 07/21/2017 15:53       LAB RESULTS: Basic Metabolic Panel: Recent Labs  Lab 07/21/17 1524  07/25/17 1015 07/26/17 0522  NA 136   < >  143 143  K 6.8*   < > 4.1 3.5  CL 101   < > 107 107  CO2 26   < > 24 26  GLUCOSE 78   < > 153* 119*  BUN 57*   < > 37* 31*  CREATININE 4.70*   < > 1.89* 1.57*  CALCIUM 8.7*   < > 8.8* 8.5*  MG 2.1  --   --   --   PHOS 6.8*  --   --   --    < > = values in this interval not displayed.   Liver Function Tests: Recent Labs  Lab 07/21/17 1524 07/25/17 1015  AST 65* 48*  ALT 26 24  ALKPHOS 80 80  BILITOT 0.4 1.0  PROT 6.5 7.0  ALBUMIN 3.2* 3.2*   Recent Labs  Lab 07/21/17 1524  LIPASE 25   Recent Labs  Lab 07/22/17 1406  AMMONIA 22   CBC: Recent Labs  Lab 07/21/17 1524  07/24/17 0639 07/25/17 1015  WBC 6.0   < > 6.4 7.2  NEUTROABS 4.3  --   --   --   HGB 8.0*   < > 9.1* 10.8*  HCT 26.9*   < > 30.4* 36.1  MCV 97.8   < > 96.8 96.3  PLT 179   < > 180 220   < > = values in this interval not displayed.   Cardiac Enzymes: Recent Labs  Lab 07/24/17 0639 07/25/17 1015  CKTOTAL 465* 168   BNP: Invalid input(s):  POCBNP CBG: Recent Labs  Lab 07/27/17 0743 07/27/17 1130  GLUCAP 152* 148*      Disposition and Follow-up: Discharge Instructions    Ambulatory referral to Neurology   Complete by:  As directed    Follow up with stroke clinic NP (Jessica Vanschaick or Cecille Rubin, if both not available, consider Dr. Antony Contras, Dr. Bess Harvest, or Dr. Sarina Ill) at [facility] in about 4 weeks.  Note patient discharge plan to SNF   Diet Carb Modified   Complete by:  As directed    Increase activity slowly   Complete by:  As directed        DISPOSITION: [facility] Follow up in 4 week(s).   Specialty:  Neurology Why:  Stroke clinic.  Office will call with appointment date and time. Contact information: [address] [place] [phone]       Sherryl Barters, MD. Schedule an appointment as soon as possible for a visit in 2 week(s).   Specialty:  Cardiology Contact information: [place], [place] [address] [phone]        Briscoe Deutscher, DO. Schedule an appointment as soon as possible for a visit in 2 week(s).   Specialty:  Family Medicine Contact information: [place] [address] [phone]            Time coordinating discharge:  45 minutes  Signed:   Estill Cotta M.D. [facility] 07/27/2017, 3:41 PM Pager: [phone]

## 2017-07-27 NOTE — Progress Notes (Signed)
RN called report to [facility] and gave report to Santiago Glad, Therapist, sports.

## 2017-07-27 NOTE — Progress Notes (Signed)
  Echocardiogram 2D Echocardiogram has been performed.  Jennette Dubin 07/27/2017, 9:52 AM

## 2017-07-27 NOTE — Progress Notes (Addendum)
[facility]                                                                              Patient Demographics  [name], is a 72 y.o. female, DOB - [birth year], YNW:[id number]  Admit date - 07/21/2017   Admitting Physician Guilford Shi, MD  Outpatient Primary MD for the patient is Briscoe Deutscher, DO  Outpatient specialists:   LOS - 6  days   Medical records reviewed and are as summarized below:    Chief Complaint  Patient presents with  . Abnormal Lab       Brief summary   Patient is a 72 year old female with IDDM, CAD, depression, anxiety, hiatal hernia/GERD, hypertension, hyperlipidemia, diastolic CHF, chronic hypoxic respiratory failure on home O2 2 L, seizure disorder who was admitted in January 6578 for complicated UTI, presented with acute mental status changes from [facility] skilled nursing facility.  Patient was admitted in 06/2017 at [facility] for ACS and underwent cardiac cath.  Patient also noted to be on multiple sedatives at baseline and Xanax.  Patient was receiving Lasix and potassium supplementation at skilled nursing facility.  She was started on Lasix 20 mg daily on 7/12 however received additional dose of 40 mg on 7/8 and 7/10.  At the time of discharge from [facility], she was on Lasix as needed only.  Creatinine at the time of discharge from [facility] hospital was 1.1.   Assessment & Plan    Principal Problem:   Acute metabolic encephalopathy/acute CVA -Acute metabolic encephalopathy with agitation and combativeness at the time of admission was thought likely due to acute kidney injury, dehydration, polypharmacy. -Narcotics were held, patient was restarted on Xanax to avoid benzodiazepine withdrawals.   - UA negative for UTI.  CT head was negative for acute CVA. - Ammonia level normal, folate normal, B12 normal -Due to no significant improvement, neurology was consulted, MRI was ordered, EEG  negative for acute seizure activity.   -MRI 7/23 showed no acute or subacute lacunar infarct in the bilateral corona radiator, question small subacute cortical infarct in the left parietal lobe -2D echo showed EF of 45 to 50%, mildly reduced, moderate aortic stenosis, no PFO -Carotid Dopplers showed bilateral ICA 1 to 39% stenosis -Lipid panel showed LDL 72, hemoglobin A1c 7.9 -Statin was held due to rhabdomyolysis -Currently on aspirin 325 mg daily, stroke service will evaluate -PT eval recommended skilled nursing facility, SLP evaluation recommended dysphagia 3 diet Addendum: -Called by stroke service, Dr. Leonie Man who feels patient has incidental lacunar strokes which may not be the cause of the presenting acute metabolic encephalopathy at the time of admission. -However recommended, changing Effient to Plavix as Effient is contraindicated in the setting of acute stroke and place the patient on aspirin and Plavix. I called patient's cardiologist, Dr. Sherryl Barters at [facility] heart center however patient's cardiologist is not available today.  -On reviewing patient's record on care everywhere, patient underwent cardiac cath on 06/27/17, no change compared to angiogram from 2017, was recommended medical therapy by cardiology, patient had no PCI placed during this cardiac cath.  -  Discussed with Dr Donnetta Hutching, [facility] cardiology, agreed aspirin and Plavix and DC Effient.  Outpatient follow-up with patient's cardiologist.  Active Problems: Acute kidney injury with hyperkalemia, rhabdomyolysis - Likely due to diuretics, dehydration.  UA negative for UTI. Cr 4.7 on admission -Renal ultrasound showed no acute findings or hydronephrosis -Creatinine improving, nephrology signed off   acute on chronic diastolic CHF due to volume overload, with wheezing and shortness of breath - Improving, -Chest x-ray on 7/21 showed diffuse vascular congestion, with BNP 2128.9 -Lasix now decreased, continue 20 mg  daily  Acute rhabdomyolysis -CK 1270, no report of any falls -> 168  -Statin was held due to rhabdomyolysis     Essential hypertension -BP currently stable, continue Imdur, beta-blocker    Hypothyroidism -TSH 1.2, continue Synthroid    Coronary artery disease of native artery of native heart with stable angina pectoris (HCC) -Continue aspirin, Effient, beta-blocker -Hold statin due to rhabdomyolysis    Diabetes mellitus type II, uncontrolled with hypoglycemia -CBG stable, continue sliding scale insulin  -   Hemoglobin A1c 7.9    dyslipidemia -Hold statin due to rhabdo    Morbid obesity ([place]) -BMI 46.3, will need diet and weight control  Chronic anemia  -Anemia panel consistent with anemia of chronic disease, hemoglobin improved 9.9 after 1 unit packed RBCs on 7/19  -H&H stable  Code Status: Partial DVT Prophylaxis:  Lovenox Family Communication: Discussed in detail with patient's daughter on the phone  Disposition Plan: Skilled nursing facility once cleared by stroke service  Time Spent in minutes 25 minutes  Procedures:  Renal ultrasound EEG normal  Consultants:   Nephrology  Neurology  Antimicrobials:      Medications  Scheduled Meds: . ALPRAZolam  0.5 mg Oral BID  . aspirin  325 mg Oral Daily  . enoxaparin (LOVENOX) injection  40 mg Subcutaneous Q24H  . furosemide  20 mg Oral Daily  . insulin aspart  0-5 Units Subcutaneous QHS  . insulin aspart  0-9 Units Subcutaneous TID WC  . isosorbide mononitrate  30 mg Oral Daily  . levETIRAcetam  1,000 mg Oral BID  . mouth rinse  15 mL Mouth Rinse BID  . metoprolol tartrate  25 mg Oral Daily  . prasugrel  10 mg Oral Daily   Continuous Infusions: . albuterol 10 mg/hr (07/21/17 1655)   PRN Meds:.acetaminophen, albuterol, ondansetron, oxymetazoline, polyethylene glycol   Antibiotics   Anti-infectives (From admission, onward)   None        Subjective:   [name] was seen and examined today.   Mental status improving, no shortness of breath, no wheezing.  No fevers or chills.    Objective:   Vitals:   07/26/17 1713 07/26/17 2115 07/27/17 0517 07/27/17 0823  BP: (!) 142/78 (!) 145/65 (!) 156/73 (!) 157/77  Pulse: 88 77 90 91  Resp: (!) 24 18 18  (!) 26  Temp: 98.6 F (37 C) 98 F (36.7 C) 98 F (36.7 C) 98.6 F (37 C)  TempSrc: [place]  SpO2: 100% 99% 97% 99%  Weight:  112.1 kg (247 lb 2.2 oz)    Height:        Intake/Output Summary (Last 24 hours) at 07/27/2017 1326 Last data filed at 07/27/2017 0600 Gross per 24 hour  Intake 60 ml  Output 550 ml  Net -490 ml     Wt Readings from Last 3 Encounters:  07/26/17 112.1 kg (247 lb 2.2 oz)  03/22/17 104.1 kg (229 lb 9.6 oz)  01/21/17 105.7 kg (233 lb)     Exam    General: Slightly lethargic today but easily arousable and oriented x self and place, NAD  Eyes:   HEENT:    Cardiovascular: S1 S2 auscultated,  Regular rate and rhythm. No pedal edema b/l  Respiratory: Clear to auscultation bilaterally, no wheezing, rales or rhonchi  Gastrointestinal: Soft, nontender, nondistended, + bowel sounds  Ext: no pedal edema bilaterally  Neuro: somewhat sleepy today, difficult to assess  Musculoskeletal: No digital cyanosis, clubbing  Skin: No rashes  Psych: Sleepy but arousable, oriented x2   Data Reviewed:  I have personally reviewed following labs and imaging studies  Micro Results Recent Results (from the past 240 hour(s))  MRSA PCR Screening     Status: Abnormal   Collection Time: 07/21/17  8:12 PM  Result Value Ref Range Status   MRSA by PCR POSITIVE (A) NEGATIVE Final    Comment:        The GeneXpert MRSA Assay (FDA approved for NASAL specimens only), is one component of a comprehensive MRSA colonization surveillance program. It is not intended to diagnose MRSA infection nor to guide or monitor treatment for MRSA infections. RESULT CALLED TO, READ BACK BY AND VERIFIED WITH: E  CASTRO RN 07/21/17 2316 JDW Performed at [facility], [place] [address] [place], [place] [address]   Urine Culture     Status: Abnormal   Collection Time: 07/22/17  6:39 AM  Result Value Ref Range Status   Specimen Description URINE, RANDOM  Final   Special Requests   Final    NONE Performed at [facility], [place] [address] [place], [place] [address]    Culture >=100,000 COLONIES/mL KLEBSIELLA PNEUMONIAE (A)  Final   Report Status 07/25/2017 FINAL  Final   Organism ID, Bacteria KLEBSIELLA PNEUMONIAE (A)  Final      Susceptibility   Klebsiella pneumoniae - MIC*    AMPICILLIN RESISTANT Resistant     CEFAZOLIN <=4 SENSITIVE Sensitive     CEFTRIAXONE <=1 SENSITIVE Sensitive     CIPROFLOXACIN <=0.25 SENSITIVE Sensitive     GENTAMICIN <=1 SENSITIVE Sensitive     IMIPENEM <=0.25 SENSITIVE Sensitive     NITROFURANTOIN 64 INTERMEDIATE Intermediate     TRIMETH/SULFA >=320 RESISTANT Resistant     AMPICILLIN/SULBACTAM 4 SENSITIVE Sensitive     PIP/TAZO <=4 SENSITIVE Sensitive     Extended ESBL NEGATIVE Sensitive     * >=100,000 COLONIES/mL KLEBSIELLA PNEUMONIAE    Radiology Reports Ct Head Wo Contrast  Result Date: 07/22/2017 CLINICAL DATA:  Altered level of consciousness, combative. History of hypertension, hyperlipidemia, seizure. EXAM: CT HEAD WITHOUT CONTRAST TECHNIQUE: Contiguous axial images were obtained from the base of the skull through the vertex without intravenous contrast. COMPARISON:  CT HEAD August 29, 2014 FINDINGS: Large body habitus results in overall noisy image quality. Moderately motion degraded examination. BRAIN: No intraparenchymal hemorrhage, mass effect nor midline shift. The ventricles and sulci are normal for age. Patchy to confluent supratentorial white matter hypodensities. No acute large vascular territory infarcts. No abnormal extra-axial fluid collections. Basal cisterns are patent. VASCULAR: Moderate calcific atherosclerosis of the carotid siphons.  SKULL: No skull fracture. No significant scalp soft tissue swelling. SINUSES/ORBITS: Confluent LEFT mastoid air cells with minimal soft tissue density. Mild paranasal sinus mucosal thickening. Hypoplastic frontal sinuses.The included ocular globes and orbital contents are non-suspicious. OTHER: Patient is edentulous. IMPRESSION: 1. Habitus and motion degraded examination. No acute intracranial process. 2. Moderate chronic small vessel ischemic  changes. 3. Chronic coalescent LEFT mastoiditis. Electronically Signed   By: Elon Alas M.D.   On: 07/22/2017 14:00   Mr Brain Wo Contrast  Result Date: 07/26/2017 CLINICAL DATA:  Altered level of consciousness. EXAM: MRI HEAD WITHOUT CONTRAST TECHNIQUE: Multiplanar, multiecho pulse sequences of the brain and surrounding structures were obtained without intravenous contrast. COMPARISON:  Head CT 07/22/2017.  Brain MRI 08/29/2014 FINDINGS: Partial study due to patient terminating scan before completion. Subcentimeter ovoid subjectively moderate restricted diffusion in the right centrum semiovale and left posterior centrum semiovale. The cortex of the left parietal lobe is slightly bright compared to the right on FLAIR and diffusion, suspicious for subacute infarct. 2 discrete areas of remote cortical infarction in the anterior right frontal lobe. There is moderate FLAIR hyperintensity in the periventricular white matter attributed to chronic small vessel ischemia. Cerebral volume loss. Only diffusion, FLAIR, and gradient sequences were acquired. IMPRESSION: 1. 2 acute or subacute lacunar infarcts in the bilateral corona radiata. 2. Question small subacute cortical infarct in the left parietal lobe. 3. Small remote right frontal cortex infarcts that have occurred since a 2016 comparison. 4. Partial study due to patient request, only 3 sequences were acquired. Electronically Signed   By: Monte Fantasia M.D.   On: 07/26/2017 16:11   US Renal  Result Date:  07/22/2017 CLINICAL DATA:  Acute kidney injury EXAM: RENAL / URINARY TRACT ULTRASOUND COMPLETE COMPARISON:  06/22/2017 FINDINGS: Right Kidney: Length: 10.1 cm. Difficult to visualize. No visible hydronephrosis. Normal echotexture. Left Kidney: Length: 9.8 cm. Echogenicity within normal limits. No mass or hydronephrosis visualized. Bladder: Appears normal for degree of bladder distention. IMPRESSION: No acute findings.  No hydronephrosis. Electronically Signed   By: Rolm Baptise M.D.   On: 07/22/2017 11:06   Dg Chest Port 1 View  Result Date: 07/24/2017 CLINICAL DATA:  Dyspnea EXAM: PORTABLE CHEST 1 VIEW COMPARISON:  07/21/2017 FINDINGS: Moderate cardiomegaly with diffuse vascular congestion and interstitial prominence compatible with interstitial edema. Mild CHF suspected. No large effusion or pneumothorax. No focal pneumonia, collapse or consolidation. Trachea is midline. IMPRESSION: Mild CHF pattern Electronically Signed   By: Jerilynn Mages.  Shick M.D.   On: 07/24/2017 08:22   Dg Chest Port 1 View  Result Date: 07/21/2017 CLINICAL DATA:  Shortness of breath EXAM: PORTABLE CHEST 1 VIEW COMPARISON:  06/20/2017 FINDINGS: Low lung volumes are present, causing crowding of the pulmonary vasculature. The patient is rotated to the right on today's radiograph, reducing diagnostic sensitivity and specificity. Suspected mild enlargement of the cardiopericardial silhouette vague prominence of the right hilum is likely attributable to the rightward rotation. IMPRESSION: 1. Low lung volumes are present, causing crowding of the pulmonary vasculature. 2. Mild enlargement of the cardiopericardial silhouette. 3. Reduced exam sensitivity due to rightward rotation. Electronically Signed   By: Van Clines M.D.   On: 07/21/2017 15:53    Lab Data:  CBC: Recent Labs  Lab 07/21/17 1524 07/21/17 1604 07/22/17 0119 07/23/17 0746 07/24/17 0639 07/25/17 1015  WBC 6.0  --  6.2 6.4 6.4 7.2  NEUTROABS 4.3  --   --   --   --    --   HGB 8.0* 8.8* 7.9* 9.9* 9.1* 10.8*  HCT 26.9* 26.0* 25.9* 32.3* 30.4* 36.1  MCV 97.8  --  96.3 95.6 96.8 96.3  PLT 179  --  177 184 180 448   Basic Metabolic Panel: Recent Labs  Lab 07/21/17 1524  07/22/17 0119 07/23/17 0746 07/24/17 0639 07/25/17 1015 07/26/17 0522  NA 136   < >  139 143 145 143 143  K 6.8*   < > 6.1* 5.8* 4.6 4.1 3.5  CL 101   < > 102 109 112* 107 107  CO2 26   < > 26 21* 24 24 26   GLUCOSE 78   < > 124* 115* 99 153* 119*  BUN 57*   < > 58* 54* 43* 37* 31*  CREATININE 4.70*   < > 4.67* 3.79* 2.66* 1.89* 1.57*  CALCIUM 8.7*   < > 8.8* 8.5* 8.4* 8.8* 8.5*  MG 2.1  --   --   --   --   --   --   PHOS 6.8*  --   --   --   --   --   --    < > = values in this interval not displayed.   GFR: Estimated Creatinine Clearance: 38.3 mL/min (A) (by C-G formula based on SCr of 1.57 mg/dL (H)). Liver Function Tests: Recent Labs  Lab 07/21/17 1524 07/25/17 1015  AST 65* 48*  ALT 26 24  ALKPHOS 80 80  BILITOT 0.4 1.0  PROT 6.5 7.0  ALBUMIN 3.2* 3.2*   Recent Labs  Lab 07/21/17 1524  LIPASE 25   Recent Labs  Lab 07/22/17 1406  AMMONIA 22   Coagulation Profile: Recent Labs  Lab 07/21/17 1524  INR 1.24   Cardiac Enzymes: Recent Labs  Lab 07/22/17 0655 07/23/17 0746 07/24/17 0639 07/25/17 1015  CKTOTAL 1,270* 814* 465* 168   BNP (last 3 results) No results for input(s): PROBNP in the last 8760 hours. HbA1C: No results for input(s): HGBA1C in the last 72 hours. CBG: Recent Labs  Lab 07/26/17 1140 07/26/17 1651 07/26/17 2114 07/27/17 0743 07/27/17 1130  GLUCAP 174* 149* 230* 152* 148*   Lipid Profile: Recent Labs    07/27/17 1209  CHOL 122  HDL 37*  LDLCALC 72  TRIG 64  CHOLHDL 3.3   Thyroid Function Tests: No results for input(s): TSH, T4TOTAL, FREET4, T3FREE, THYROIDAB in the last 72 hours. Anemia Panel: No results for input(s): VITAMINB12, FOLATE, FERRITIN, TIBC, IRON, RETICCTPCT in the last 72 hours. Urine analysis:     Component Value Date/Time   COLORURINE YELLOW 07/23/2017 1142   APPEARANCEUR CLOUDY (A) 07/23/2017 1142   LABSPEC 1.016 07/23/2017 1142   PHURINE 5.0 07/23/2017 1142   GLUCOSEU NEGATIVE 07/23/2017 1142   HGBUR SMALL (A) 07/23/2017 1142   BILIRUBINUR NEGATIVE 07/23/2017 1142   BILIRUBINUR Negative 01/21/2017 1044   KETONESUR NEGATIVE 07/23/2017 1142   PROTEINUR 100 (A) 07/23/2017 1142   UROBILINOGEN 0.2 01/21/2017 1044   UROBILINOGEN 0.2 08/28/2014 1555   NITRITE NEGATIVE 07/23/2017 1142   LEUKOCYTESUR NEGATIVE 07/23/2017 1142     Ripudeep Rai M.D. [facility] 07/27/2017, 1:26 PM  Pager: [phone] Between 7am to 7pm - call Pager - [phone]  After 7pm go to [email or web address] - password [email or web address]  Call night coverage person covering after 7pm

## 2017-07-27 NOTE — Evaluation (Signed)
Occupational Therapy Evaluation Patient Details Name: [name] MRN: [id number] DOB: 1946/01/03 Today's Date: 07/27/2017    History of Present Illness Patient is a 72 year old female with IDDM, CAD, depression, anxiety, hiatal hernia/GERD, hypertension, hyperlipidemia, diastolic CHF, chronic hypoxic respiratory failure on home O2 2 L, seizure disorder who was admitted in January 3846 for complicated UTI, presented with acute mental status changes from [facility] skilled nursing facility.    Clinical Impression   Pt requires mod A with mobility and extensive assist with ADL. Pt with decreased strength, balance and endurance. PTA, pt was at [facility] participating tin short term rehab. Pt will return to SNF once medically ready and continue with rehab before going back home with spouse. All education completed and no further acute OT indicated at this time.    Follow Up Recommendations  SNF    Equipment Recommendations  Other (comment)(TBD at SNF)    Recommendations for Other Services       Precautions / Restrictions Precautions Precautions: Fall Restrictions Weight Bearing Restrictions: No      Mobility Bed Mobility Overal bed mobility: Needs Assistance Bed Mobility: Supine to Sit;Sit to Supine     Supine to sit: Mod assist Sit to supine: Mod assist   General bed mobility comments: Mod assist to help elevate trunk to sit; Mod assist to help LEs back on to bed  Transfers Overall transfer level: Needs assistance Equipment used: 1 person hand held assist Transfers: Sit to/from [facility] Sit to Stand: Mod assist Stand pivot transfers: Mod assist       General transfer comment: assist to power up, slow pace of movement    Balance Overall balance assessment: Needs assistance Sitting-balance support: No upper extremity supported;Feet supported Sitting balance-Leahy Scale: Fair     Standing balance support: During functional activity;Single extremity  supported Standing balance-Leahy Scale: Poor                             ADL either performed or assessed with clinical judgement   ADL Overall ADL's : Needs assistance/impaired Eating/Feeding: Independent;Sitting;Bed level   Grooming: Wash/dry hands;Wash/dry face;Min guard;Sitting   Upper Body Bathing: Minimal assistance;Sitting   Lower Body Bathing: Maximal assistance   Upper Body Dressing : Minimal assistance;Sitting   Lower Body Dressing: Total assistance   Toilet Transfer: Moderate assistance;Stand-pivot;BSC   Toileting- Clothing Manipulation and Hygiene: Total assistance       Functional mobility during ADLs: Moderate assistance       Vision Patient Visual Report: No change from baseline       Perception     Praxis      Pertinent Vitals/Pain Pain Assessment: No/denies pain     Hand Dominance Right   Extremity/Trunk Assessment Upper Extremity Assessment Upper Extremity Assessment: Generalized weakness   Lower Extremity Assessment Lower Extremity Assessment: Defer to PT evaluation       Communication Communication Communication: No difficulties   Cognition Arousal/Alertness: Awake/alert Behavior During Therapy: WFL for tasks assessed/performed Overall Cognitive Status: Impaired/Different from baseline                                 General Comments: Pleasant and following commands well; Noting decr awareness of safety and deficits   General Comments       Exercises     Shoulder Instructions      Home Living Family/patient expects to be discharged to::  Skilled nursing facility Living Arrangements: Spouse/significant other Available Help at Discharge: Family;Available 24 hours/day Type of Home: House Home Access: Level entry     Home Layout: One level     Bathroom Shower/Tub: Tub/shower unit         Home Equipment: Cane - single point;Walker - 2 wheels;Wheelchair - manual;Shower seat;Bedside commode           Prior Functioning/Environment Level of Independence: Needs assistance    ADL's / Homemaking Assistance Needed: was at SNF participating in rehab, reports that she required assist for mobility and ADLs at SNF            OT Problem List: Decreased strength;Decreased activity tolerance;Decreased knowledge of use of DME or AE;Impaired balance (sitting and/or standing);Decreased coordination;Obesity      OT Treatment/Interventions:      OT Goals(Current goals can be found in the care plan section) Acute Rehab OT Goals Patient Stated Goal: wants to walk and go home  OT Goal Formulation: All assessment and education complete, DC therapy  OT Frequency:     Barriers to D/C: Decreased caregiver support          Co-evaluation              AM-PAC PT "6 Clicks" Daily Activity     Outcome Measure Help from another person eating meals?: None Help from another person taking care of personal grooming?: A Little Help from another person toileting, which includes using toliet, bedpan, or urinal?: Total Help from another person bathing (including washing, rinsing, drying)?: A Lot Help from another person to put on and taking off regular upper body clothing?: A Little Help from another person to put on and taking off regular lower body clothing?: Total 6 Click Score: 14   End of Session Equipment Utilized During Treatment: Gait belt;Other (comment)(BSC)  Activity Tolerance: Patient tolerated treatment well Patient left: in bed  OT Visit Diagnosis: Unsteadiness on feet (R26.81);Other abnormalities of gait and mobility (R26.89);Muscle weakness (generalized) (M62.81);Other symptoms and signs involving cognitive function                Time: 2500-3704 OT Time Calculation (min): 26 min Charges:  OT General Charges $OT Visit: 1 Visit OT Evaluation $OT Eval Moderate Complexity: 1 Mod G-Codes: OT G-codes **NOT FOR INPATIENT CLASS** Functional Assessment Tool Used: AM-PAC 6 Clicks  Daily Activity     Britt Bottom 07/27/2017, 1:51 PM

## 2017-07-27 NOTE — Progress Notes (Signed)
Patient being discharged to [facility] with her daughter. Explained that patient will be out of oxygen from discharge to the hospital premises to SNF. She understood that she will be off oxygen for that period of time. "I know its abut ten minutes to drive" Discharge papers in an envelope given for the SNF  and a copy for her daughter. Daughter stated "we can not just wait that long for her to get discharge."

## 2017-07-27 NOTE — Progress Notes (Addendum)
STROKE TEAM PROGRESS NOTE   INTERVAL HISTORY No one is at the bedside.  She and Dr. Leonie Man discussed recent hospitalization at [facility].  No stroke symptoms.  Admitted for altered mental status/confusion thought to be encephalopathic given multiple sedative medications.  Vitals:   07/26/17 1713 07/26/17 2115 07/27/17 0517 07/27/17 0823  BP: (!) 142/78 (!) 145/65 (!) 156/73 (!) 157/77  Pulse: 88 77 90 91  Resp: (!) 24 18 18  (!) 26  Temp: 98.6 F (37 C) 98 F (36.7 C) 98 F (36.7 C) 98.6 F (37 C)  TempSrc: Oral Oral Oral Oral  SpO2: 100% 99% 97% 99%  Weight:  112.1 kg (247 lb 2.2 oz)    Height:        CBC:  Recent Labs  Lab 07/21/17 1524  07/24/17 0639 07/25/17 1015  WBC 6.0   < > 6.4 7.2  NEUTROABS 4.3  --   --   --   HGB 8.0*   < > 9.1* 10.8*  HCT 26.9*   < > 30.4* 36.1  MCV 97.8   < > 96.8 96.3  PLT 179   < > 180 220   < > = values in this interval not displayed.    Basic Metabolic Panel:  Recent Labs  Lab 07/21/17 1524  07/25/17 1015 07/26/17 0522  NA 136   < > 143 143  K 6.8*   < > 4.1 3.5  CL 101   < > 107 107  CO2 26   < > 24 26  GLUCOSE 78   < > 153* 119*  BUN 57*   < > 37* 31*  CREATININE 4.70*   < > 1.89* 1.57*  CALCIUM 8.7*   < > 8.8* 8.5*  MG 2.1  --   --   --   PHOS 6.8*  --   --   --    < > = values in this interval not displayed.   Lipid Panel:     Component Value Date/Time   CHOL 172 09/10/2016 0051   TRIG 91 09/10/2016 0051   HDL 41 09/10/2016 0051   CHOLHDL 4.2 09/10/2016 0051   VLDL 18 09/10/2016 0051   LDLCALC 113 (H) 09/10/2016 0051   HgbA1c:  Lab Results  Component Value Date   HGBA1C 7.9 (H) 07/22/2017   Urine Drug Screen:     Component Value Date/Time   LABOPIA NONE DETECTED 08/28/2014 1555   COCAINSCRNUR NONE DETECTED 08/28/2014 1555   LABBENZ POSITIVE (A) 08/28/2014 1555   AMPHETMU NONE DETECTED 08/28/2014 1555   THCU NONE DETECTED 08/28/2014 1555   LABBARB NONE DETECTED 08/28/2014 1555    Alcohol Level      Component Value Date/Time   ETH <5 08/28/2014 1506    IMAGING Mr Brain Wo Contrast  Result Date: 07/26/2017 CLINICAL DATA:  Altered level of consciousness. EXAM: MRI HEAD WITHOUT CONTRAST TECHNIQUE: Multiplanar, multiecho pulse sequences of the brain and surrounding structures were obtained without intravenous contrast. COMPARISON:  Head CT 07/22/2017.  Brain MRI 08/29/2014 FINDINGS: Partial study due to patient terminating scan before completion. Subcentimeter ovoid subjectively moderate restricted diffusion in the right centrum semiovale and left posterior centrum semiovale. The cortex of the left parietal lobe is slightly bright compared to the right on FLAIR and diffusion, suspicious for subacute infarct. 2 discrete areas of remote cortical infarction in the anterior right frontal lobe. There is moderate FLAIR hyperintensity in the periventricular white matter attributed to chronic small vessel ischemia. Cerebral volume  loss. Only diffusion, FLAIR, and gradient sequences were acquired. IMPRESSION: 1. 2 acute or subacute lacunar infarcts in the bilateral corona radiata. 2. Question small subacute cortical infarct in the left parietal lobe. 3. Small remote right frontal cortex infarcts that have occurred since a 2016 comparison. 4. Partial study due to patient request, only 3 sequences were acquired. Electronically Signed   By: Monte Fantasia M.D.   On: 07/26/2017 16:11    PHYSICAL EXAM  Pleasant elderly lady currently not in distress. . Afebrile. Head is nontraumatic. Neck is supple without bruit.    Cardiac exam no murmur or gallop. Lungs are clear to auscultation. Distal pulses are well felt. Neurological Exam : Awake alert oriented to time and place only. Diminished attention, registration and recall. He is a distractibility. No aphasia or apraxia dysarthria. Follows simple one and two-step commands. Extraocular movements are full range without nystagmus. Blinks to threat bilaterally. Fundi  were not visualized. Vision acuity seems adequate. Face is symmetric without weakness. Tongue is midline. Motor system exam shows no upper extremity drift. Mild symmetric proximal hip flexion weakness bilaterally due to pain. No focal weakness. Deep tendon reflexes symmetric. Plantars are downgoing. Sensation is intact bilaterally. Gait not tested. ASSESSMENT/PLAN [name] is a 72 y.o. female with history of IDDM, CAD, anxiety, depression, CHF, chronic hypoxic respiratory failure on home O2, seizures on keppra, GERD/HH,  who had cardiac cath 06/28/1027 at [place] for ACS presenting with confusion from [facility] SN[facility], felt to be metabolic encephalopathy. MRI done during workup shows bilateral corona radiata and possible small left parietal white matter infarcts.  Stroke: Incidental bilateral corona radiata and small left parietal white matter infarcts secondary to small vessel disease   CT head motion degraded.  No acute abnormality.  Small vessel disease.  Chronic left mastoiditis.  MRI  bilateral corona radiata and small left parietal white matter infarcts.  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  Bilateral vertebral artery flow antegrade   2D Echo EF 45 to 50% with no source of embolus.  Moderate aortic stenosis.  Right atrium mildly dilated.  LDL 72  HgbA1c 7.9  Lovenox 40 mg sq daily for VTE prophylaxis  aspirin 325 mg daily and Effient prior to admission, now on aspirin 325 mg daily and Effient. Given mild stroke, recommend continuation of dual antiplatelet therapy.  Given recent stroke, recommend changing Effient to Plavix as Effient is contraindicated in setting of acute stroke. Defer to IM/cards.  Therapy recommendations:  SNF (from [facility])  Disposition:  Return to SNF  Essential hypertension  Stable . BP goal normotensive  Hyperlipidemia  Home meds:  crestor 10  LDL 72, goal < 70  Resume crestor once  stable from rhabdomyolysis standpoint  Diabetes type II  HgbA1c 7.9, goal < 7.0  Uncontrolled  Other Stroke Risk Factors  Advanced age  Former Cigarette smoker, quit 28 yrs ago  Morbid Obesity, Body mass index is 45.2 kg/m., recommend weight loss, diet and exercise as appropriate   No Hx stroke/TIA  Coronary artery disease s/p cath 06/27/17 at [place] for ACS  Acute on chronic diastolic congestive heart failure due to volume overload with wheezing and shortness of breath  Other Active Problems  Chronic hypoxic respiratory failure on home O2  Acute kidney injury with hyperkalemia, rhabdomyolysis -due to dehydration and diuretics.  Creatinine improving  Acute rhabdomyolysis  Hypothyroidism  Chronic anemia  NOTHING FURTHER TO ADD FROM THE STROKE STANDPOINT  Effient is contraindicated in setting of acute stroke.  Recommend change to Plavix.  Defer to IM/cards. Dr. Tana Coast notified.  Patient has a 10-15% risk of having another stroke over the next year, the highest risk is within 2 weeks of the most recent stroke/TIA (risk of having a stroke following a stroke or TIA is the same).  Ongoing risk factor control by Primary Care Physician  Stroke Service will sign off. Please call should any needs arise.  Follow-up Stroke Clinic at [facility] in 4 weeks, order placed.  Hospital day # [place], MSN, APRN, ANVP-BC, AGPCNP-BC Advanced Practice Stroke Nurse [place] for Schedule & Pager information 07/27/2017 1:30 PM  I have personally examined this patient, reviewed notes, independently viewed imaging studies, participated in medical decision making and plan of care.ROS completed by me personally and pertinent positives fully documented  I have made any additions or clarifications directly to the above note. Agree with note above. She presented with heart her mental status which is likely multifactorial given several  contributing factors to her encephalopathy. CT scan shows bilateral tiny lacunar subcortical infarcts which are likely incidental. Patient may need to be on dual antiplatelet therapy given her recent cardiac ischemia but Effient is contraindicated in setting of stroke hence recommend discuss with cardiology to switch Effient or Plavix if possible. On discussion with the patient as well as with Dr. Sarajane Jews and answered questions. Greater than 50% time during this 35 minute visit was spent on counseling and coordination of care note lacunar infarcts and answering questions  Antony Contras, MD Medical Director [place] Pager: [phone] 07/27/2017 3:21 PM  To contact Stroke Continuity provider, please refer to [email or web address] After hours, contact General Neurology

## 2017-07-27 NOTE — Progress Notes (Signed)
*  Preliminary Results* Carotid artery duplex has been completed. Bilateral internal carotid arteries are 1-39%.  Vertebral arteries are patent with antegrade flow.  07/27/2017 9:36 AM  Abram Sander

## 2017-07-27 NOTE — Clinical Social Work Note (Signed)
Patient medically stable for discharge back to [facility] today. Facility admissions director notified and discharge clinicals transmitted to facility. Husband, [name] at the bedside and aware of discharge. [name] will be transported to facility by ambulance. CSW signing off as no other SW intervention services needed.  [name], MSW, LCSW Licensed Clinical Social Worker [place] [phone]

## 2017-07-28 ENCOUNTER — Telehealth: Payer: Self-pay | Admitting: *Deleted

## 2017-07-28 NOTE — Telephone Encounter (Signed)
Spoke with patients husband. He is not sure if patient wants to continue seeing Dr Juleen China. He also isn't sure if the SNF she was discharged too will be seeing the patient as hospital follow up. He will call back to either schedule or update on her PCP.

## 2017-07-29 DIAGNOSIS — I2510 Atherosclerotic heart disease of native coronary artery without angina pectoris: Secondary | ICD-10-CM | POA: Diagnosis not present

## 2017-07-29 DIAGNOSIS — N179 Acute kidney failure, unspecified: Secondary | ICD-10-CM | POA: Diagnosis not present

## 2017-07-29 DIAGNOSIS — E039 Hypothyroidism, unspecified: Secondary | ICD-10-CM | POA: Diagnosis not present

## 2017-07-29 DIAGNOSIS — M6282 Rhabdomyolysis: Secondary | ICD-10-CM | POA: Diagnosis not present

## 2017-07-29 DIAGNOSIS — I639 Cerebral infarction, unspecified: Secondary | ICD-10-CM | POA: Diagnosis not present

## 2017-07-29 DIAGNOSIS — I10 Essential (primary) hypertension: Secondary | ICD-10-CM | POA: Diagnosis not present

## 2017-07-29 DIAGNOSIS — I5023 Acute on chronic systolic (congestive) heart failure: Secondary | ICD-10-CM | POA: Diagnosis not present

## 2017-07-29 DIAGNOSIS — I6389 Other cerebral infarction: Secondary | ICD-10-CM | POA: Diagnosis not present

## 2017-07-29 DIAGNOSIS — E875 Hyperkalemia: Secondary | ICD-10-CM | POA: Diagnosis not present

## 2017-07-29 DIAGNOSIS — R4182 Altered mental status, unspecified: Secondary | ICD-10-CM | POA: Diagnosis not present

## 2017-07-29 DIAGNOSIS — I509 Heart failure, unspecified: Secondary | ICD-10-CM | POA: Diagnosis not present

## 2017-07-30 DIAGNOSIS — N179 Acute kidney failure, unspecified: Secondary | ICD-10-CM | POA: Diagnosis not present

## 2017-07-30 DIAGNOSIS — I2510 Atherosclerotic heart disease of native coronary artery without angina pectoris: Secondary | ICD-10-CM | POA: Diagnosis not present

## 2017-07-30 DIAGNOSIS — M6282 Rhabdomyolysis: Secondary | ICD-10-CM | POA: Diagnosis not present

## 2017-07-30 DIAGNOSIS — I6389 Other cerebral infarction: Secondary | ICD-10-CM | POA: Diagnosis not present

## 2017-08-03 DIAGNOSIS — M6282 Rhabdomyolysis: Secondary | ICD-10-CM | POA: Diagnosis not present

## 2017-08-03 DIAGNOSIS — N179 Acute kidney failure, unspecified: Secondary | ICD-10-CM | POA: Diagnosis not present

## 2017-08-03 DIAGNOSIS — I214 Non-ST elevation (NSTEMI) myocardial infarction: Secondary | ICD-10-CM | POA: Diagnosis not present

## 2017-08-03 DIAGNOSIS — R0789 Other chest pain: Secondary | ICD-10-CM | POA: Diagnosis not present

## 2017-08-04 DIAGNOSIS — I5023 Acute on chronic systolic (congestive) heart failure: Secondary | ICD-10-CM | POA: Diagnosis not present

## 2017-08-04 DIAGNOSIS — I2510 Atherosclerotic heart disease of native coronary artery without angina pectoris: Secondary | ICD-10-CM | POA: Diagnosis not present

## 2017-08-04 DIAGNOSIS — I6389 Other cerebral infarction: Secondary | ICD-10-CM | POA: Diagnosis not present

## 2017-08-04 DIAGNOSIS — R0789 Other chest pain: Secondary | ICD-10-CM | POA: Diagnosis not present

## 2017-08-08 DIAGNOSIS — I2510 Atherosclerotic heart disease of native coronary artery without angina pectoris: Secondary | ICD-10-CM | POA: Diagnosis not present

## 2017-08-08 DIAGNOSIS — I5023 Acute on chronic systolic (congestive) heart failure: Secondary | ICD-10-CM | POA: Diagnosis not present

## 2017-08-08 DIAGNOSIS — I6389 Other cerebral infarction: Secondary | ICD-10-CM | POA: Diagnosis not present

## 2017-08-08 DIAGNOSIS — G4089 Other seizures: Secondary | ICD-10-CM | POA: Diagnosis not present

## 2017-08-18 DIAGNOSIS — I350 Nonrheumatic aortic (valve) stenosis: Secondary | ICD-10-CM | POA: Diagnosis not present

## 2017-08-18 DIAGNOSIS — N183 Chronic kidney disease, stage 3 (moderate): Secondary | ICD-10-CM | POA: Diagnosis not present

## 2017-08-18 DIAGNOSIS — G8929 Other chronic pain: Secondary | ICD-10-CM | POA: Diagnosis not present

## 2017-08-18 DIAGNOSIS — F418 Other specified anxiety disorders: Secondary | ICD-10-CM | POA: Diagnosis not present

## 2017-08-18 DIAGNOSIS — I10 Essential (primary) hypertension: Secondary | ICD-10-CM | POA: Diagnosis not present

## 2017-08-18 DIAGNOSIS — I639 Cerebral infarction, unspecified: Secondary | ICD-10-CM | POA: Diagnosis not present

## 2017-08-18 DIAGNOSIS — F0390 Unspecified dementia without behavioral disturbance: Secondary | ICD-10-CM | POA: Diagnosis not present

## 2017-08-18 DIAGNOSIS — I509 Heart failure, unspecified: Secondary | ICD-10-CM | POA: Diagnosis not present

## 2017-08-18 DIAGNOSIS — R2689 Other abnormalities of gait and mobility: Secondary | ICD-10-CM | POA: Diagnosis not present

## 2017-08-22 ENCOUNTER — Other Ambulatory Visit: Payer: Self-pay

## 2017-08-22 ENCOUNTER — Emergency Department: Payer: Medicare Other

## 2017-08-22 ENCOUNTER — Inpatient Hospital Stay
Admission: EM | Admit: 2017-08-22 | Discharge: 2017-08-25 | DRG: 281 | Disposition: A | Discharge status: Home Health | Payer: Medicare Other | Attending: Internal Medicine | Admitting: Internal Medicine

## 2017-08-22 DIAGNOSIS — Z833 Family history of diabetes mellitus: Secondary | ICD-10-CM

## 2017-08-22 DIAGNOSIS — I214 Non-ST elevation (NSTEMI) myocardial infarction: Principal | ICD-10-CM | POA: Diagnosis present

## 2017-08-22 DIAGNOSIS — G40909 Epilepsy, unspecified, not intractable, without status epilepticus: Secondary | ICD-10-CM | POA: Diagnosis present

## 2017-08-22 DIAGNOSIS — R0602 Shortness of breath: Secondary | ICD-10-CM | POA: Diagnosis not present

## 2017-08-22 DIAGNOSIS — Z87891 Personal history of nicotine dependence: Secondary | ICD-10-CM

## 2017-08-22 DIAGNOSIS — I2510 Atherosclerotic heart disease of native coronary artery without angina pectoris: Secondary | ICD-10-CM | POA: Diagnosis present

## 2017-08-22 DIAGNOSIS — Z7902 Long term (current) use of antithrombotics/antiplatelets: Secondary | ICD-10-CM

## 2017-08-22 DIAGNOSIS — I252 Old myocardial infarction: Secondary | ICD-10-CM | POA: Diagnosis not present

## 2017-08-22 DIAGNOSIS — R0789 Other chest pain: Secondary | ICD-10-CM | POA: Diagnosis not present

## 2017-08-22 DIAGNOSIS — R531 Weakness: Secondary | ICD-10-CM | POA: Diagnosis present

## 2017-08-22 DIAGNOSIS — Z885 Allergy status to narcotic agent status: Secondary | ICD-10-CM

## 2017-08-22 DIAGNOSIS — K219 Gastro-esophageal reflux disease without esophagitis: Secondary | ICD-10-CM | POA: Diagnosis present

## 2017-08-22 DIAGNOSIS — F419 Anxiety disorder, unspecified: Secondary | ICD-10-CM | POA: Diagnosis present

## 2017-08-22 DIAGNOSIS — I10 Essential (primary) hypertension: Secondary | ICD-10-CM | POA: Diagnosis not present

## 2017-08-22 DIAGNOSIS — E1122 Type 2 diabetes mellitus with diabetic chronic kidney disease: Secondary | ICD-10-CM | POA: Diagnosis present

## 2017-08-22 DIAGNOSIS — Z6841 Body Mass Index (BMI) 40.0 and over, adult: Secondary | ICD-10-CM | POA: Diagnosis not present

## 2017-08-22 DIAGNOSIS — Z8249 Family history of ischemic heart disease and other diseases of the circulatory system: Secondary | ICD-10-CM

## 2017-08-22 DIAGNOSIS — Z79899 Other long term (current) drug therapy: Secondary | ICD-10-CM

## 2017-08-22 DIAGNOSIS — I130 Hypertensive heart and chronic kidney disease with heart failure and stage 1 through stage 4 chronic kidney disease, or unspecified chronic kidney disease: Secondary | ICD-10-CM | POA: Diagnosis present

## 2017-08-22 DIAGNOSIS — N183 Chronic kidney disease, stage 3 (moderate): Secondary | ICD-10-CM | POA: Diagnosis present

## 2017-08-22 DIAGNOSIS — E785 Hyperlipidemia, unspecified: Secondary | ICD-10-CM | POA: Diagnosis present

## 2017-08-22 DIAGNOSIS — D649 Anemia, unspecified: Secondary | ICD-10-CM | POA: Diagnosis not present

## 2017-08-22 DIAGNOSIS — I5023 Acute on chronic systolic (congestive) heart failure: Secondary | ICD-10-CM | POA: Diagnosis not present

## 2017-08-22 DIAGNOSIS — R079 Chest pain, unspecified: Secondary | ICD-10-CM | POA: Diagnosis not present

## 2017-08-22 DIAGNOSIS — F329 Major depressive disorder, single episode, unspecified: Secondary | ICD-10-CM | POA: Diagnosis present

## 2017-08-22 DIAGNOSIS — Z841 Family history of disorders of kidney and ureter: Secondary | ICD-10-CM

## 2017-08-22 DIAGNOSIS — R7989 Other specified abnormal findings of blood chemistry: Secondary | ICD-10-CM | POA: Diagnosis not present

## 2017-08-22 DIAGNOSIS — D631 Anemia in chronic kidney disease: Secondary | ICD-10-CM | POA: Diagnosis present

## 2017-08-22 DIAGNOSIS — Z90710 Acquired absence of both cervix and uterus: Secondary | ICD-10-CM

## 2017-08-22 DIAGNOSIS — Z7982 Long term (current) use of aspirin: Secondary | ICD-10-CM

## 2017-08-22 DIAGNOSIS — Z825 Family history of asthma and other chronic lower respiratory diseases: Secondary | ICD-10-CM | POA: Diagnosis not present

## 2017-08-22 DIAGNOSIS — I6389 Other cerebral infarction: Secondary | ICD-10-CM | POA: Diagnosis not present

## 2017-08-22 DIAGNOSIS — R0902 Hypoxemia: Secondary | ICD-10-CM | POA: Diagnosis not present

## 2017-08-22 DIAGNOSIS — I5032 Chronic diastolic (congestive) heart failure: Secondary | ICD-10-CM | POA: Diagnosis present

## 2017-08-22 DIAGNOSIS — Z794 Long term (current) use of insulin: Secondary | ICD-10-CM

## 2017-08-22 LAB — BASIC METABOLIC PANEL
ANION GAP: 6 (ref 5–15)
BUN: 31 mg/dL — ABNORMAL HIGH (ref 8–23)
CO2: 29 mmol/L (ref 22–32)
Calcium: 7.6 mg/dL — ABNORMAL LOW (ref 8.9–10.3)
Chloride: 105 mmol/L (ref 98–111)
Creatinine, Ser: 1.18 mg/dL — ABNORMAL HIGH (ref 0.44–1.00)
GFR, EST AFRICAN AMERICAN: 52 mL/min — AB (ref >60)
GFR, EST NON AFRICAN AMERICAN: 45 mL/min — AB (ref >60)
Glucose, Bld: 190 mg/dL — ABNORMAL HIGH (ref 70–99)
POTASSIUM: 3.7 mmol/L (ref 3.5–5.1)
SODIUM: 140 mmol/L (ref 135–145)

## 2017-08-22 LAB — CBC
HCT: 27.4 % — ABNORMAL LOW (ref 35.0–47.0)
Hemoglobin: 9.1 g/dL — ABNORMAL LOW (ref 12.0–16.0)
MCH: 30.9 pg (ref 26.0–34.0)
MCHC: 33.2 g/dL (ref 32.0–36.0)
MCV: 92.9 fL (ref 80.0–100.0)
PLATELETS: 187 10*3/uL (ref 150–440)
RBC: 2.95 MIL/uL — AB (ref 3.80–5.20)
RDW: 19.2 % — ABNORMAL HIGH (ref 11.5–14.5)
WBC: 4.5 10*3/uL (ref 3.6–11.0)

## 2017-08-22 LAB — TROPONIN I: TROPONIN I: 1.29 ng/mL — AB (ref <0.03)

## 2017-08-22 LAB — PROTIME-INR
INR: 1.1
PROTHROMBIN TIME: 14.1 s (ref 11.4–15.2)

## 2017-08-22 LAB — APTT: APTT: 33 s (ref 24–36)

## 2017-08-22 MED ORDER — HEPARIN BOLUS VIA INFUSION
4000.0000 [IU] | Freq: Once | INTRAVENOUS | Status: AC
  Administered 2017-08-23: 4000 [IU] via INTRAVENOUS
  Filled 2017-08-22: qty 4000

## 2017-08-22 MED ORDER — HEPARIN (PORCINE) IN NACL 100-0.45 UNIT/ML-% IJ SOLN
1150.0000 [IU]/h | INTRAMUSCULAR | Status: DC
  Administered 2017-08-23: 900 [IU]/h via INTRAVENOUS
  Administered 2017-08-23 – 2017-08-24 (×2): 1150 [IU]/h via INTRAVENOUS
  Filled 2017-08-22 (×3): qty 250

## 2017-08-22 NOTE — ED Triage Notes (Addendum)
Patient to Rm 1 from Rehab facility via [place].  Per EMS earlier in the afternoon patient had chest pain was given Tramadol for pain and had lab work drawn.  Lab work back tonight and Troponin was elevated at 0.09.  On arrival to ED patient denies any chest pain, shortness of breath or other symptoms.    Per EMS patient received 324 mg of aspirin prior to their arrival.

## 2017-08-22 NOTE — Progress Notes (Signed)
ANTICOAGULATION CONSULT NOTE - Initial Consult  Pharmacy Consult for heparin drip Indication: chest pain/ACS  Allergies  Allergen Reactions  . Codeine Itching    Patient Measurements: Height: 5\' 2"  (157.5 cm) Weight: 240 lb (108.9 kg) IBW/kg (Calculated) : 50.1 Heparin Dosing Weight: 77 kg  Vital Signs: Temp: 98.1 F (36.7 C) (08/19 2212) Temp Source: Oral (08/19 2212) BP: 125/49 (08/19 2212) Pulse Rate: 66 (08/19 2212)  Labs: Recent Labs    08/22/17 2231  HGB 9.1*  HCT 27.4*  PLT 187  CREATININE 1.18*  TROPONINI 1.29*    Estimated Creatinine Clearance: 50.1 mL/min (A) (by C-G formula based on SCr of 1.18 mg/dL (H)).   Medical History: Past Medical History:  Diagnosis Date  . Anxiety   . Coronary artery disease   . Depression   . Diabetes mellitus    insulin dependent  . Edema of lower extremity   . Esophageal stricture 2008   EGD  . Fatigue   . GERD (gastroesophageal reflux disease)   . Heart murmur   . Hiatal hernia 2008   EGD  . Hyperlipidemia   . Hypertension   . Knee pain   . Morbid obesity ([place])   . Nonproductive cough    chronic  . Orthopnea   . Seizure disorder ([place])   . SOB (shortness of breath)   . Syncope and collapse   . Tremor     Medications:  No anticoagulation in PTA meds  Assessment: Trop 1.29   Goal of Therapy:  Heparin level 0.3-0.7 units/ml Monitor platelets by anticoagulation protocol: Yes   Plan:  4000 unit bolus and initial rate of 900 units/hr. First heparin level 8 hours after start of infusion.  Dameion Briles S 08/22/2017,11:32 PM

## 2017-08-22 NOTE — ED Notes (Signed)
Family at bedside. 

## 2017-08-22 NOTE — ED Provider Notes (Signed)
[facility] Emergency Department Provider Note   ____________________________________________   First MD Initiated Contact with Patient 08/22/17 2222     (approximate)  I have reviewed the triage vital signs and the nursing notes.   HISTORY  Chief Complaint Abnormal Lab and Chest Pain    HPI [name] is a 72 y.o. female presents for evaluation of chest pain that occurred at 3 PM.  She reports it lasted about 2 hours and is better now.  Also heavy pressure across the front of her chest.  She reports nursing home team requested to have her come to the hospital, but she did not wish to and wanted to wait it out.  She then had a lab test that showed some evidence of heart damage and was sent for further evaluation.  Currently pain-free.  No shortness of breath no trouble breathing no headaches.  Denies  Some slight leg swelling lower legs bilateral.  No fevers or chills.  Denies trouble breathing.  Reports was doing fairly well at the nursing home until chest pain abruptly started while at rest at 3 PM.  Now all symptoms completely resolved.  Took aspirin 325 mg today and prior to arrival  Past Medical History:  Diagnosis Date  . Anxiety   . Coronary artery disease   . Depression   . Diabetes mellitus    insulin dependent  . Edema of lower extremity   . Esophageal stricture 2008   EGD  . Fatigue   . GERD (gastroesophageal reflux disease)   . Heart murmur   . Hiatal hernia 2008   EGD  . Hyperlipidemia   . Hypertension   . Knee pain   . Morbid obesity ([place])   . Nonproductive cough    chronic  . Orthopnea   . Seizure disorder ([place])   . SOB (shortness of breath)   . Syncope and collapse   . Tremor     Patient Active Problem List   Diagnosis Date Noted  . Cerebral thrombosis with cerebral infarction 07/27/2017  . Acute metabolic encephalopathy 21/19/4174  . Anemia 07/22/2017  . Acute renal failure (ARF) ([place]) 07/21/2017  . Pyelonephritis  01/24/2017  . LV dysfunction 10/06/2016  . 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 Acute on chronic diastolic CHF (congestive heart failure) ([place]) 09/09/2016  . Elevated troponin 09/09/2016  . Migraine 08/20/2016  . Morbid obesity ([place]) 08/20/2016  . Anxiety 10/02/2015  . DOE (dyspnea on exertion) 10/02/2015  . Dyslipidemia 10/02/2015  . Nonrheumatic aortic valve stenosis 10/02/2015  . Old MI (myocardial infarction) 10/02/2015  . Insomnia 11/03/2014  . Weakness generalized 08/28/2014  . Seizure disorder ([place]) 08/28/2014  . Diabetes mellitus without complication ([place]) 08/17/4816  . Coronary artery disease of native artery of native heart with stable angina pectoris ([place]) 06/02/2010  . Depression 05/20/2007  . Essential hypertension 05/20/2007  . Diaphragmatic hernia 05/20/2007  . Diverticulosis of colon 05/20/2007  . IBS 05/20/2007  . Fatty liver 05/20/2007  . Hypothyroidism 05/20/2007  . Hx of transient ischemic attack (TIA) 05/20/2007    Past Surgical History:  Procedure Laterality Date  . ABDOMINAL HYSTERECTOMY    . CARDIAC CATHETERIZATION  03/28/2009  . KNEE ARTHROSCOPY    . TRANSTHORACIC ECHOCARDIOGRAM     showed ef of 65% with no regional wall motion abnormalities    Prior to Admission medications   Medication Sig Start Date End Date Taking? Authorizing Provider  acetaminophen (TYLENOL)  325 MG tablet Take 2 tablets (650 mg total) by mouth every 4 (four) hours as needed for headache or mild pain. Patient not taking: Reported on 07/21/2017 09/12/16   Robbie Lis, MD  albuterol (PROVENTIL) (2.5 MG/3ML) 0.083% nebulizer solution Take 3 mLs (2.5 mg total) by nebulization every 4 (four) hours as needed for wheezing or shortness of breath. 07/27/17   Rai, Vernelle Emerald, MD  ALPRAZolam Duanne Moron) 0.5 MG tablet Take 1 tablet (0.5 mg total) by mouth 2 (two) times daily. 07/27/17   Rai, Vernelle Emerald, MD  aspirin EC 325 MG EC tablet  Take 1 tablet (325 mg total) by mouth daily. 09/02/14   Rai, Vernelle Emerald, MD  clopidogrel (PLAVIX) 75 MG tablet Take 1 tablet (75 mg total) by mouth daily. 07/28/17   Rai, Vernelle Emerald, MD  furosemide (LASIX) 20 MG tablet Take 20 mg by mouth daily.    [provider]  glucose blood (ONETOUCH VERIO) test strip 1 each by Other route daily. And lancets 1/day. Dx code E11.9. Patient not taking: Reported on 07/21/2017 03/24/17   Renato Shin, MD  insulin aspart (NOVOLOG) 100 UNIT/ML injection Inject 0-9 Units into the skin 3 (three) times daily with meals. Sliding scale CBG 70 - 120: 0 units CBG 121 - 150: 1 unit,  CBG 151 - 200: 2 units,  CBG 201 - 250: 3 units,  CBG 251 - 300: 5 units,  CBG 301 - 350: 7 units,  CBG 351 - 400: 9 units   CBG > 400: 9 units and notify your MD 07/27/17   Rai, Vernelle Emerald, MD  isosorbide mononitrate (IMDUR) 60 MG 24 hr tablet Take 60 mg by mouth daily.     [provider]  levETIRAcetam (KEPPRA) 1000 MG tablet Take 1 tablet (1,000 mg total) by mouth 2 (two) times daily. Patient not taking: Reported on 07/21/2017 08/30/16   Briscoe Deutscher, DO  metoprolol (LOPRESSOR) 50 MG tablet Take 50 mg by mouth daily.  07/02/14   [provider]  ondansetron (ZOFRAN) 4 MG tablet Take 4 mg by mouth every 6 (six) hours as needed for nausea or vomiting.    [provider]  oxymetazoline (AFRIN) 0.05 % nasal spray Place 1 spray into both nostrils 2 (two) times daily as needed (nosebleeds).    [provider]  polyethylene glycol (MIRALAX / GLYCOLAX) packet Take 17 g by mouth daily as needed for mild constipation. Patient not taking: Reported on 07/21/2017 01/27/17   Geradine Girt, DO  rizatriptan (MAXALT) 10 MG tablet Take 1 tablet (10 mg total) by mouth as needed for migraine. May repeat in 2 hours if needed Patient taking differently: Take 10 mg by mouth daily as needed (for chronic pain).  09/22/16   Briscoe Deutscher, DO  rosuvastatin (CRESTOR) 10 MG tablet  Take 1 tablet (10 mg total) by mouth daily. Patient taking differently: Take 10 mg by mouth every evening.  01/21/17   Briscoe Deutscher, DO  sodium chloride (OCEAN) 0.65 % SOLN nasal spray Place 2 sprays into both nostrils 3 (three) times daily.    [provider]    Allergies Codeine  Family History  Problem Relation Age of Onset  . Heart attack Mother   . Hypertension Mother   . COPD Father   . Heart disease Sister   . Diabetes Brother   . Diabetes Brother   . Prostate cancer Brother   . Kidney disease Brother     Social History Social History  Tobacco Use  . Smoking status: Former Smoker    Last attempt to quit: 06/02/1970    Years since quitting: 47.2  . Smokeless tobacco: Never Used  Substance Use Topics  . Alcohol use: No  . Drug use: No    Review of Systems Constitutional: No fever/chills Eyes: No visual changes. ENT: No sore throat. Cardiovascular: See HPI.  Central chest pressure.  No ripping tearing or moving pain.  Did not radiate to her back.  Reports when she had her "heart attack" a few weeks ago it radiated to the back at that time. Respiratory: Denies shortness of breath. Gastrointestinal: No abdominal pain.  No nausea, no vomiting.  No diarrhea.  No constipation. Genitourinary: Negative for dysuria. Musculoskeletal: Negative for back pain. Skin: Negative for rash. Neurological: Negative for headaches, focal weakness or numbness.    ____________________________________________   PHYSICAL EXAM:  VITAL SIGNS: ED Triage Vitals  Enc Vitals Group     BP 08/22/17 2212 (!) 125/49     Pulse Rate 08/22/17 2212 66     Resp 08/22/17 2212 (!) 22     Temp 08/22/17 2212 98.1 F (36.7 C)     Temp Source 08/22/17 2212 Oral     SpO2 08/22/17 2212 100 %     Weight 08/22/17 2213 240 lb (108.9 kg)     Height 08/22/17 2213 5\' 2"  (1.575 m)     Head Circumference --      Peak Flow --      Pain Score 08/22/17 2213 0     Pain Loc --      Pain Edu? --        Excl. in [place]? --     Constitutional: Alert and oriented. Well appearing and in no acute distress.  Chronically ill in appearance but in no distress.  Very pleasant. Eyes: Conjunctivae are normal. Head: Atraumatic. Nose: No congestion/rhinnorhea. Mouth/Throat: Mucous membranes are moist. Neck: No stridor.   Cardiovascular: Normal rate, regular rhythm. Grossly normal heart sounds.  Good peripheral circulation. Respiratory: Normal respiratory effort.  No retractions. Lungs CTAB. Gastrointestinal: Soft and nontender. No distention. Musculoskeletal: No lower extremity tenderness has 1+ lower extremity edema bilateral. Neurologic:  Normal speech and language. No gross focal neurologic deficits are appreciated.  Skin:  Skin is warm, dry and intact. No rash noted. Psychiatric: Mood and affect are normal. Speech and behavior are normal.  ____________________________________________   LABS (all labs ordered are listed, but only abnormal results are displayed)  Labs Reviewed  CBC - Abnormal; Notable for the following components:      Result Value   RBC 2.95 (*)    Hemoglobin 9.1 (*)    HCT 27.4 (*)    RDW 19.2 (*)    All other components within normal limits  BASIC METABOLIC PANEL - Abnormal; Notable for the following components:   Glucose, Bld 190 (*)    BUN 31 (*)    Creatinine, Ser 1.18 (*)    Calcium 7.6 (*)    GFR calc non Af Amer 45 (*)    GFR calc Af Amer 52 (*)    All other components within normal limits  TROPONIN I - Abnormal; Notable for the following components:   Troponin I 1.29 (*)    All other components within normal limits  PROTIME-INR  APTT   ____________________________________________  EKG  Reviewed and interpreted by me at 2111 Heart rate 65 QRS 110 QTc 450 Some artifact present, normal sinus rhythm.  There is slight  likely about 1 to just less than 1 mm of ST elevation in lead III.  There is no notable ST elevation in all the leads though inversions  are notable in lateral leads.  EKG discussed with Dr. Fletcher Anon  Repeat EKG performed at 2240 Interpreted by me Heart rate 60 QRS 110 QTc 410 Normal sinus rhythm, again some baseline artifact.  ST depression in V2, also inversion 1 aVL.  Again suspect some very slight ST elevation in lead III only.  No evidence of contiguous ST elevation.  Patient currently pain-free. ____________________________________________  RADIOLOGY  Dg Chest Port 1 View  Result Date: 08/22/2017 CLINICAL DATA:  Chest pain EXAM: PORTABLE CHEST 1 VIEW COMPARISON:  None. FINDINGS: Mild cardiomegaly. Low lung volumes. No consolidation or effusion. No pneumothorax. IMPRESSION: No active disease.  Cardiomegaly. Electronically Signed   By: Donavan Foil M.D.   On: 08/22/2017 22:46    ____________________________________________   PROCEDURES  Procedure(s) performed: None  Procedures  Critical Care performed: Yes, see critical care note(s)   CRITICAL CARE Performed by: Delman Kitten   Total critical care time: 35 minutes  Critical care time was exclusive of separately billable procedures and treating other patients.  Critical care was necessary to treat or prevent imminent or life-threatening deterioration.  Critical care was time spent personally by me on the following activities: development of treatment plan with patient and/or surrogate as well as nursing, discussions with consultants, evaluation of patient's response to treatment, examination of patient, obtaining history from patient or surrogate, ordering and performing treatments and interventions, ordering and review of laboratory studies, ordering and review of radiographic studies, pulse oximetry and re-evaluation of patient's condition.   Chest pain with elevated troponin.  Appears consistent with non-ST elevation MI, some ST elevation is notable and a single inferior lead only but the patient is now completely pain-free and episode occurred about 3 PM.   Discussed with cardiology, Dr. Fletcher Anon will treat medically with aspirin and heparin at this time. ____________________________________________   INITIAL IMPRESSION / ASSESSMENT AND PLAN / ED COURSE  Pertinent labs & imaging results that were available during my care of the patient were reviewed by me and considered in my medical decision making (see chart for details).  Chest pain.  Now resolved but with elevated troponin.  EKG changes concerning for acute cardiac disease, but with resolution of chest pain at this time appears consistent with non-ST elevation MI or a late presentation of STEMI though only 1 lead demonstrates STE in III, thus treated medically.  Admit for further care and intervention.  Not a candidate for emergent cardiac catheterization per Dr. Fletcher Anon given EKG and now painfree status.   Patient understanding agreeable with plan for admission.      ____________________________________________   FINAL CLINICAL IMPRESSION(S) / ED DIAGNOSES  Final diagnoses:  NSTEMI (non-ST elevated myocardial infarction) ([place])      NEW MEDICATIONS STARTED DURING THIS VISIT:  New Prescriptions   No medications on file     Note:  This document was prepared using Dragon voice recognition software and may include unintentional dictation errors.     Delman Kitten, MD 08/23/17 [phone]

## 2017-08-22 NOTE — H&P (Signed)
[facility] [place] NAME: [name]    MR#:  [id number]  DATE OF BIRTH:  1945/12/21  DATE OF ADMISSION:  08/22/2017  PRIMARY CARE PHYSICIAN: Briscoe Deutscher, DO   REQUESTING/REFERRING PHYSICIAN:   CHIEF COMPLAINT:   Chief Complaint  Patient presents with  . Abnormal Lab  . Chest Pain    HISTORY OF PRESENT ILLNESS: [name]  is a 72 y.o. female with a known history of coronary artery disease status post MI in the past, seizure disorder, morbid obesity, hypertension, diabetes type 2 and other comorbidities. Patient was transferred to emergency room from SNF for intermittent chest pain, described as heavy pressure, across the front of her chest, 7 out of 10 in severity, without radiation.  Her symptoms are worse with exertion and improved with sublingual nitroglycerin.  The pain started around 3 PM, but patient initially refused to come to emergency room.  She eventually agreed with transfer to the hospital when the blood test done at the facility showed elevated troponin level is 0.09. Patient is currently chest pain-free.  She denies any palpitations, no fever or chills, no shortness of breath. That is done emergency room show elevated troponin level of 1.29, hemoglobin level is 9.1, creatinine level is 1.18, glucose level is 190. EKG shows normal sinus rhythm with heart rate at 65 bpm.  There is ST depression in V2 and inversion in aVL.  Minimal ST elevation is noted in lead III only.  No other ST elevation. Chest x-ray is negative for acute cardiopulmonary abnormalities. Per records review, most recent cardiac cath was done on June 27, 2017 and was reported with stable old changes when compared to the one from 2017.  No procedure was done and patient was advised to continue medical treatment. Patient is admitted for further evaluation and treatment.  PAST MEDICAL HISTORY:   Past Medical History:  Diagnosis Date  . Anxiety   .  Coronary artery disease   . Depression   . Diabetes mellitus    insulin dependent  . Edema of lower extremity   . Esophageal stricture 2008   EGD  . Fatigue   . GERD (gastroesophageal reflux disease)   . Heart murmur   . Hiatal hernia 2008   EGD  . Hyperlipidemia   . Hypertension   . Knee pain   . Morbid obesity ([place])   . Nonproductive cough    chronic  . Orthopnea   . Seizure disorder ([place])   . SOB (shortness of breath)   . Syncope and collapse   . Tremor     PAST SURGICAL HISTORY:  Past Surgical History:  Procedure Laterality Date  . ABDOMINAL HYSTERECTOMY    . CARDIAC CATHETERIZATION  03/28/2009  . KNEE ARTHROSCOPY    . TRANSTHORACIC ECHOCARDIOGRAM     showed ef of 65% with no regional wall motion abnormalities    SOCIAL HISTORY:  Social History   Tobacco Use  . Smoking status: Former Smoker    Last attempt to quit: 06/02/1970    Years since quitting: 47.2  . Smokeless tobacco: Never Used  Substance Use Topics  . Alcohol use: No    FAMILY HISTORY:  Family History  Problem Relation Age of Onset  . Heart attack Mother   . Hypertension Mother   . COPD Father   . Heart disease Sister   . Diabetes Brother   . Diabetes Brother   . Prostate cancer Brother   .  Kidney disease Brother     DRUG ALLERGIES:  Allergies  Allergen Reactions  . Codeine Itching    REVIEW OF SYSTEMS:   CONSTITUTIONAL: No fever, fatigue or weakness.  EYES: No changes in vision.  EARS, NOSE, AND THROAT: No tinnitus or ear pain.  RESPIRATORY: No cough, shortness of breath, wheezing or hemoptysis.  CARDIOVASCULAR: Positive for chest pain; no orthopnea; positive for lower extremities edema.  GASTROINTESTINAL: No nausea, vomiting, diarrhea or abdominal pain.  GENITOURINARY: No dysuria, hematuria.  ENDOCRINE: No polyuria, nocturia. HEMATOLOGY: No bleeding. SKIN: No rash or lesion. MUSCULOSKELETAL: No joint pain at this time.   NEUROLOGIC: No focal weakness.  PSYCHIATRY: No  anxiety or depression.   MEDICATIONS AT HOME:  Prior to Admission medications   Medication Sig Start Date End Date Taking? Authorizing Provider  acetaminophen (TYLENOL) 325 MG tablet Take 2 tablets (650 mg total) by mouth every 4 (four) hours as needed for headache or mild pain. 09/12/16  Yes Robbie Lis, MD  albuterol (PROVENTIL) (2.5 MG/3ML) 0.083% nebulizer solution Take 3 mLs (2.5 mg total) by nebulization every 4 (four) hours as needed for wheezing or shortness of breath. 07/27/17  Yes Rai, Ripudeep K, MD  ALPRAZolam (XANAX) 0.25 MG tablet Take 0.25 mg by mouth at bedtime as needed for anxiety.   Yes [provider]  ALPRAZolam (XANAX) 0.5 MG tablet Take 1 tablet (0.5 mg total) by mouth 2 (two) times daily. 07/27/17  Yes Rai, Ripudeep K, MD  aspirin EC 325 MG EC tablet Take 1 tablet (325 mg total) by mouth daily. 09/02/14  Yes Rai, Ripudeep K, MD  clopidogrel (PLAVIX) 75 MG tablet Take 1 tablet (75 mg total) by mouth daily. 07/28/17  Yes Rai, Ripudeep K, MD  furosemide (LASIX) 20 MG tablet Take 20 mg by mouth daily.   Yes [provider]  gabapentin (NEURONTIN) 300 MG capsule Take 300 mg by mouth at bedtime.    Yes [provider]  insulin aspart (NOVOLOG) 100 UNIT/ML injection Inject 0-9 Units into the skin 3 (three) times daily with meals. Sliding scale CBG 70 - 120: 0 units CBG 121 - 150: 1 unit,  CBG 151 - 200: 2 units,  CBG 201 - 250: 3 units,  CBG 251 - 300: 5 units,  CBG 301 - 350: 7 units,  CBG 351 - 400: 9 units   CBG > 400: 9 units and notify your MD 07/27/17  Yes Rai, Ripudeep K, MD  insulin detemir (LEVEMIR) 100 UNIT/ML injection Inject 10 Units into the skin daily.   Yes [provider]  isosorbide mononitrate (IMDUR) 60 MG 24 hr tablet Take 60 mg by mouth daily.    Yes [provider]  levETIRAcetam (KEPPRA) 1000 MG tablet Take 1 tablet (1,000 mg total) by mouth 2 (two) times daily. 08/30/16  Yes Briscoe Deutscher, DO  loratadine (CLARITIN) 10  MG tablet Take 10 mg by mouth daily as needed for allergies.   Yes [provider]  Melatonin 3 MG TABS Take 1 tablet by mouth at bedtime.   Yes [provider]  metoprolol (LOPRESSOR) 50 MG tablet Take 50 mg by mouth daily.  07/02/14  Yes [provider]  ondansetron (ZOFRAN) 4 MG tablet Take 4 mg by mouth every 6 (six) hours as needed for nausea or vomiting.   Yes [provider]  oxymetazoline (AFRIN) 0.05 % nasal spray Place 1 spray into both nostrils 2 (two) times daily as needed (nosebleeds).   Yes [provider]  polyethylene glycol (MIRALAX / GLYCOLAX) packet Take 17 g by mouth daily as needed for mild constipation. 01/27/17  Yes Vann, Jessica U, DO  rizatriptan (MAXALT) 10 MG tablet Take 1 tablet (10 mg total) by mouth as needed for migraine. May repeat in 2 hours if needed Patient taking differently: Take 10 mg by mouth daily as needed (for chronic pain).  09/22/16  Yes Briscoe Deutscher, DO  rosuvastatin (CRESTOR) 10 MG tablet Take 1 tablet (10 mg total) by mouth daily. Patient taking differently: Take 10 mg by mouth every evening.  01/21/17  Yes Briscoe Deutscher, DO  sodium chloride (OCEAN) 0.65 % SOLN nasal spray Place 2 sprays into both nostrils 3 (three) times daily.   Yes [provider]  traMADol (ULTRAM) 50 MG tablet Take 50 mg by mouth every 6 (six) hours as needed.   Yes [provider]  glucose blood (ONETOUCH VERIO) test strip 1 each by Other route daily. And lancets 1/day. Dx code E11.9. Patient not taking: Reported on 07/21/2017 03/24/17   Renato Shin, MD      PHYSICAL EXAMINATION:   VITAL SIGNS: Blood pressure (!) 125/49, pulse 66, temperature 98.1 F (36.7 C), temperature source Oral, resp. rate (!) 22, height 5\' 2"  (1.575 m), weight 108.9 kg, SpO2 100 %.  GENERAL:  72 y.o.-year-old patient lying in the bed with no acute distress.  EYES: Pupils equal, round, reactive to light and accommodation. No scleral icterus.  Extraocular muscles intact.  HEENT: Head atraumatic, normocephalic. Oropharynx and nasopharynx clear.  NECK:  Supple, no jugular venous distention. No thyroid enlargement, no tenderness.  LUNGS: Normal breath sounds bilaterally, no wheezing, rales,rhonchi or crepitation. No use of accessory muscles of respiration.  CARDIOVASCULAR: S1, S2 normal. No S3/S4.  ABDOMEN: Soft, nontender, nondistended. Bowel sounds present. No organomegaly or mass.  EXTREMITIES: 1+ bilateral pedal edema, no cyanosis, or clubbing.  NEUROLOGIC: Cranial nerves II through XII are intact. Muscle strength 5/5 in all extremities. Sensation intact.   PSYCHIATRIC: The patient is alert and oriented x 3.  SKIN: No obvious rash, lesion, or ulcer.   LABORATORY PANEL:   CBC Recent Labs  Lab 08/22/17 2231  WBC 4.5  HGB 9.1*  HCT 27.4*  PLT 187  MCV 92.9  MCH 30.9  MCHC 33.2  RDW 19.2*   ------------------------------------------------------------------------------------------------------------------  Chemistries  Recent Labs  Lab 08/22/17 2231  NA 140  K 3.7  CL 105  CO2 29  GLUCOSE 190*  BUN 31*  CREATININE 1.18*  CALCIUM 7.6*   ------------------------------------------------------------------------------------------------------------------ estimated creatinine clearance is 50.1 mL/min (A) (by C-G formula based on SCr of 1.18 mg/dL (H)). ------------------------------------------------------------------------------------------------------------------ No results for input(s): TSH, T4TOTAL, T3FREE, THYROIDAB in the last 72 hours.  Invalid input(s): FREET3   Coagulation profile Recent Labs  Lab 08/22/17 2232  INR 1.10   ------------------------------------------------------------------------------------------------------------------- No results for input(s): DDIMER in the last 72  hours. -------------------------------------------------------------------------------------------------------------------  Cardiac Enzymes Recent Labs  Lab 08/22/17 2231  TROPONINI 1.29*   ------------------------------------------------------------------------------------------------------------------ Invalid input(s): POCBNP  ---------------------------------------------------------------------------------------------------------------  Urinalysis    Component Value Date/Time   COLORURINE YELLOW 07/23/2017 1142   APPEARANCEUR CLOUDY (A) 07/23/2017 1142   LABSPEC 1.016 07/23/2017 1142   PHURINE 5.0 07/23/2017 1142   GLUCOSEU NEGATIVE 07/23/2017 1142   HGBUR SMALL (A) 07/23/2017 1142   BILIRUBINUR NEGATIVE 07/23/2017 1142   BILIRUBINUR Negative 01/21/2017 1044   KETONESUR NEGATIVE 07/23/2017 1142   PROTEINUR 100 (A) 07/23/2017 1142   UROBILINOGEN 0.2 01/21/2017 1044   UROBILINOGEN 0.2  08/28/2014 1555   NITRITE NEGATIVE 07/23/2017 1142   LEUKOCYTESUR NEGATIVE 07/23/2017 1142     RADIOLOGY: Dg Chest Port 1 View  Result Date: 08/22/2017 CLINICAL DATA:  Chest pain EXAM: PORTABLE CHEST 1 VIEW COMPARISON:  None. FINDINGS: Mild cardiomegaly. Low lung volumes. No consolidation or effusion. No pneumothorax. IMPRESSION: No active disease.  Cardiomegaly. Electronically Signed   By: Donavan Foil M.D.   On: 08/22/2017 22:46    EKG: Orders placed or performed during the hospital encounter of 08/22/17  . EKG 12-Lead  . EKG 12-Lead  . ED EKG  . ED EKG  . Repeat EKG  . Repeat EKG    IMPRESSION AND PLAN:  1.  Non-ST elevation MI.  Troponin level is elevated at 1.29.  We will start patient on aspirin and heparin drip.  Cardiology is consulted for further evaluation and treatment.  2.  CKD 3.  Creatinine is stable, at 1.18, which seems to be the baseline for this patient.  Continue to monitor kidney function closely and avoid nephrotoxic medications.  3.  Diabetes type 2.  Will  monitor blood sugars before meals and at bedtime.  Will use insulin treatment during the hospital stay.  4.  Diastolic CHF, currently clinically compensated.  Continue medical treatment.  5.  Hypertension, stable, will restart home medications.  6.  Chronic normocytic anemia, mostly anemia of chronic diseases.  Hemoglobin level is stable at 9.1.  No active bleeding.  Continue to monitor hemoglobin level closely.  All the records are reviewed and case discussed with ED provider. Management plans discussed with the patient, who is in agreement.  CODE STATUS: Full Code Status History    Date Active Date Inactive Code Status Order ID Comments User Context   07/21/2017 1934 07/27/2017 2245 Partial Code [id number]  Guilford Shi, MD ED   07/21/2017 1838 07/21/2017 1933 Full Code [id number]  Guilford Shi, MD ED   01/24/2017 0204 01/27/2017 2031 Full Code [id number]  Vilma Prader, MD ED   09/09/2016 2215 09/12/2016 1935 Partial Code [id number]  Ivor Costa, MD ED   08/28/2014 2345 09/02/2014 1622 Full Code [id number]  Theressa Millard, MD Inpatient    Questions for Most Recent Historical Code Status (Order [id number])    Question Answer Comment   In the event of cardiac or respiratory ARREST: Initiate Code Blue, Call Rapid Response Yes    In the event of cardiac or respiratory ARREST: Perform CPR Yes    In the event of cardiac or respiratory ARREST: Perform Intubation/Mechanical Ventilation No    In the event of cardiac or respiratory ARREST: Use NIPPV/BiPAp only if indicated Yes    In the event of cardiac or respiratory ARREST: Administer ACLS medications if indicated Yes    In the event of cardiac or respiratory ARREST: Perform Defibrillation or Cardioversion if indicated Yes         Advance Directive Documentation     Most Recent Value  Type of Advance Directive  Healthcare Power of Attorney  Pre-existing out of facility DNR order (yellow form or pink MOST form)  -  "MOST" Form in Place?   -       TOTAL TIME TAKING CARE OF THIS PATIENT: 45 minutes.    Amelia Jo M.D on 08/22/2017 at 11:56 PM  Between 7am to 6pm - Pager - [phone]  After 6pm go to [email or web address] - password [email or web address] [facility] [place] [facility]  [phone]  CC: Primary care physician;  Briscoe Deutscher, DO

## 2017-08-23 ENCOUNTER — Encounter: Payer: Self-pay | Admitting: *Deleted

## 2017-08-23 LAB — BASIC METABOLIC PANEL
Anion gap: 5 (ref 5–15)
BUN: 33 mg/dL — ABNORMAL HIGH (ref 8–23)
CALCIUM: 8.3 mg/dL — AB (ref 8.9–10.3)
CO2: 33 mmol/L — AB (ref 22–32)
Chloride: 104 mmol/L (ref 98–111)
Creatinine, Ser: 1.32 mg/dL — ABNORMAL HIGH (ref 0.44–1.00)
GFR, EST AFRICAN AMERICAN: 45 mL/min — AB (ref >60)
GFR, EST NON AFRICAN AMERICAN: 39 mL/min — AB (ref >60)
GLUCOSE: 177 mg/dL — AB (ref 70–99)
POTASSIUM: 3.8 mmol/L (ref 3.5–5.1)
Sodium: 142 mmol/L (ref 135–145)

## 2017-08-23 LAB — GLUCOSE, CAPILLARY
GLUCOSE-CAPILLARY: 198 mg/dL — AB (ref 70–99)
GLUCOSE-CAPILLARY: 104 mg/dL — AB (ref 70–99)
GLUCOSE-CAPILLARY: 95 mg/dL (ref 70–99)
GLUCOSE-CAPILLARY: 108 mg/dL — AB (ref 70–99)
GLUCOSE-CAPILLARY: 131 mg/dL — AB (ref 70–99)

## 2017-08-23 LAB — TROPONIN I
TROPONIN I: 1.24 ng/mL — AB (ref <0.03)
Troponin I: 1.24 ng/mL (ref <0.03)
Troponin I: 1.23 ng/mL (ref <0.03)
Troponin I: 1.22 ng/mL (ref <0.03)

## 2017-08-23 LAB — CBC
HEMATOCRIT: 26.8 % — AB (ref 35.0–47.0)
HEMOGLOBIN: 8.8 g/dL — AB (ref 12.0–16.0)
MCH: 30.6 pg (ref 26.0–34.0)
MCHC: 32.9 g/dL (ref 32.0–36.0)
MCV: 93.2 fL (ref 80.0–100.0)
Platelets: 181 10*3/uL (ref 150–440)
RBC: 2.88 MIL/uL — ABNORMAL LOW (ref 3.80–5.20)
RDW: 19.1 % — ABNORMAL HIGH (ref 11.5–14.5)
WBC: 4.2 10*3/uL (ref 3.6–11.0)

## 2017-08-23 LAB — HEPARIN LEVEL (UNFRACTIONATED)
Heparin Unfractionated: <0.1 IU/mL — ABNORMAL LOW (ref 0.30–0.70)
Heparin Unfractionated: 0.31 IU/mL (ref 0.30–0.70)

## 2017-08-23 LAB — MRSA PCR SCREENING: MRSA BY PCR: NEGATIVE

## 2017-08-23 MED ORDER — GABAPENTIN 300 MG PO CAPS
300.0000 mg | ORAL_CAPSULE | Freq: Every day | ORAL | Status: DC
  Administered 2017-08-23 – 2017-08-24 (×3): 300 mg via ORAL
  Filled 2017-08-23 (×3): qty 1

## 2017-08-23 MED ORDER — ALPRAZOLAM 0.5 MG PO TABS
0.2500 mg | ORAL_TABLET | Freq: Every evening | ORAL | Status: DC | PRN
  Administered 2017-08-23 – 2017-08-24 (×2): 0.25 mg via ORAL
  Filled 2017-08-23 (×2): qty 1

## 2017-08-23 MED ORDER — DOCUSATE SODIUM 100 MG PO CAPS
100.0000 mg | ORAL_CAPSULE | Freq: Two times a day (BID) | ORAL | Status: DC
  Administered 2017-08-23 – 2017-08-25 (×6): 100 mg via ORAL
  Filled 2017-08-23 (×6): qty 1

## 2017-08-23 MED ORDER — ISOSORBIDE MONONITRATE ER 60 MG PO TB24
60.0000 mg | ORAL_TABLET | Freq: Every day | ORAL | Status: DC
  Administered 2017-08-23 – 2017-08-25 (×3): 60 mg via ORAL
  Filled 2017-08-23 (×3): qty 1

## 2017-08-23 MED ORDER — HEPARIN BOLUS VIA INFUSION
2300.0000 [IU] | Freq: Once | INTRAVENOUS | Status: AC
  Administered 2017-08-23: 2300 [IU] via INTRAVENOUS
  Filled 2017-08-23: qty 2300

## 2017-08-23 MED ORDER — ROSUVASTATIN CALCIUM 10 MG PO TABS: 10.0000 mg | ORAL_TABLET | Freq: Every evening | ORAL | Status: DC

## 2017-08-23 MED ORDER — TRAMADOL HCL 50 MG PO TABS
50.0000 mg | ORAL_TABLET | Freq: Four times a day (QID) | ORAL | Status: DC | PRN
  Administered 2017-08-23 – 2017-08-25 (×4): 50 mg via ORAL
  Filled 2017-08-23 (×4): qty 1

## 2017-08-23 MED ORDER — TRAZODONE HCL 50 MG PO TABS
25.0000 mg | ORAL_TABLET | Freq: Every evening | ORAL | Status: DC | PRN
  Administered 2017-08-23 – 2017-08-24 (×2): 25 mg via ORAL
  Filled 2017-08-23 (×2): qty 1

## 2017-08-23 MED ORDER — ACETAMINOPHEN 325 MG PO TABS: 650.0000 mg | ORAL_TABLET | Freq: Four times a day (QID) | ORAL | Status: DC | PRN

## 2017-08-23 MED ORDER — ONDANSETRON HCL 4 MG/2ML IJ SOLN: 4.0000 mg | Freq: Four times a day (QID) | INTRAMUSCULAR | Status: DC | PRN

## 2017-08-23 MED ORDER — ROSUVASTATIN CALCIUM 10 MG PO TABS
20.0000 mg | ORAL_TABLET | Freq: Every evening | ORAL | Status: DC
  Administered 2017-08-23 – 2017-08-25 (×3): 20 mg via ORAL
  Filled 2017-08-23 (×3): qty 2

## 2017-08-23 MED ORDER — ASPIRIN EC 325 MG PO TBEC
325.0000 mg | DELAYED_RELEASE_TABLET | Freq: Every day | ORAL | Status: DC
  Administered 2017-08-23 – 2017-08-25 (×3): 325 mg via ORAL
  Filled 2017-08-23 (×3): qty 1

## 2017-08-23 MED ORDER — ONDANSETRON HCL 4 MG PO TABS: 4.0000 mg | ORAL_TABLET | Freq: Four times a day (QID) | ORAL | Status: DC | PRN

## 2017-08-23 MED ORDER — MELATONIN 5 MG PO TABS
2.5000 mg | ORAL_TABLET | Freq: Every day | ORAL | Status: DC
  Administered 2017-08-23 – 2017-08-24 (×2): 2.5 mg via ORAL
  Filled 2017-08-23 (×3): qty 0.5

## 2017-08-23 MED ORDER — FUROSEMIDE 20 MG PO TABS
20.0000 mg | ORAL_TABLET | Freq: Every day | ORAL | Status: DC
  Administered 2017-08-23 – 2017-08-25 (×3): 20 mg via ORAL
  Filled 2017-08-23 (×3): qty 1

## 2017-08-23 MED ORDER — BISACODYL 5 MG PO TBEC
5.0000 mg | DELAYED_RELEASE_TABLET | Freq: Every day | ORAL | Status: DC | PRN
  Administered 2017-08-25: 5 mg via ORAL
  Filled 2017-08-23: qty 1

## 2017-08-23 MED ORDER — INSULIN ASPART 100 UNIT/ML ~~LOC~~ SOLN
0.0000 [IU] | Freq: Three times a day (TID) | SUBCUTANEOUS | Status: DC
  Administered 2017-08-24 – 2017-08-25 (×2): 2 [IU] via SUBCUTANEOUS
  Filled 2017-08-23 (×2): qty 1

## 2017-08-23 MED ORDER — SODIUM CHLORIDE 0.9 % IV SOLN
INTRAVENOUS | Status: DC
  Administered 2017-08-23 – 2017-08-24 (×4): via INTRAVENOUS

## 2017-08-23 MED ORDER — MELATONIN 3 MG PO TABS: 1.0000 | ORAL_TABLET | Freq: Every day | ORAL | Status: DC

## 2017-08-23 MED ORDER — SALINE SPRAY 0.65 % NA SOLN
2.0000 | Freq: Three times a day (TID) | NASAL | Status: DC
  Administered 2017-08-23 – 2017-08-25 (×8): 2 via NASAL
  Filled 2017-08-23: qty 44

## 2017-08-23 MED ORDER — LORATADINE 10 MG PO TABS: 10.0000 mg | ORAL_TABLET | Freq: Every day | ORAL | Status: DC | PRN

## 2017-08-23 MED ORDER — INSULIN ASPART 100 UNIT/ML ~~LOC~~ SOLN: 0.0000 [IU] | Freq: Every day | SUBCUTANEOUS | Status: DC

## 2017-08-23 MED ORDER — LEVETIRACETAM 500 MG PO TABS
1000.0000 mg | ORAL_TABLET | Freq: Two times a day (BID) | ORAL | Status: DC
  Administered 2017-08-23 – 2017-08-25 (×6): 1000 mg via ORAL
  Filled 2017-08-23 (×6): qty 2

## 2017-08-23 MED ORDER — INSULIN DETEMIR 100 UNIT/ML ~~LOC~~ SOLN
10.0000 [IU] | Freq: Every day | SUBCUTANEOUS | Status: DC
  Administered 2017-08-23 – 2017-08-25 (×3): 10 [IU] via SUBCUTANEOUS
  Filled 2017-08-23 (×5): qty 0.1

## 2017-08-23 MED ORDER — ALBUTEROL SULFATE (2.5 MG/3ML) 0.083% IN NEBU: 2.5000 mg | INHALATION_SOLUTION | RESPIRATORY_TRACT | Status: DC | PRN

## 2017-08-23 MED ORDER — ACETAMINOPHEN 650 MG RE SUPP: 650.0000 mg | Freq: Four times a day (QID) | RECTAL | Status: DC | PRN

## 2017-08-23 MED ORDER — METOPROLOL TARTRATE 50 MG PO TABS
50.0000 mg | ORAL_TABLET | Freq: Every day | ORAL | Status: DC
  Administered 2017-08-23 – 2017-08-25 (×3): 50 mg via ORAL
  Filled 2017-08-23 (×3): qty 1

## 2017-08-23 NOTE — NC FL2 (Signed)
[place] LEVEL OF CARE SCREENING TOOL     IDENTIFICATION  Patient Name: [name] Birthdate: 1945/12/05 Sex: female Admission Date (Current Location): 08/22/2017  [facility] and [place] Number:  Engineering geologist and Address:  [facility], [address], [place], [place] [address]      Provider Number: [id number]  Attending Physician Name and Address:  Vaughan Basta, *  Relative Name and Phone Number:       Current Level of Care: Hospital Recommended Level of Care: [place] Prior Approval Number:    Date Approved/Denied:   PASRR Number:    Discharge Plan: SNF    Current Diagnoses: Patient Active Problem List   Diagnosis Date Noted  . NSTEMI (non-ST elevated myocardial infarction) ([place]) 08/22/2017  . Cerebral thrombosis with cerebral infarction 07/27/2017  . Acute metabolic encephalopathy 97/35/3299  . Anemia 07/22/2017  . Acute renal failure (ARF) ([place]) 07/21/2017  . Pyelonephritis 01/24/2017  . LV dysfunction 10/06/2016  . 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 Acute on chronic diastolic CHF (congestive heart failure) ([place]) 09/09/2016  . Elevated troponin 09/09/2016  . Migraine 08/20/2016  . Morbid obesity ([place]) 08/20/2016  . Anxiety 10/02/2015  . DOE (dyspnea on exertion) 10/02/2015  . Dyslipidemia 10/02/2015  . Nonrheumatic aortic valve stenosis 10/02/2015  . Old MI (myocardial infarction) 10/02/2015  . Insomnia 11/03/2014  . Weakness generalized 08/28/2014  . Seizure disorder ([place]) 08/28/2014  . Diabetes mellitus without complication ([place]) 24/26/8341  . Coronary artery disease of native artery of native heart with stable angina pectoris ([place]) 06/02/2010  . Depression 05/20/2007  . Essential hypertension 05/20/2007  . Diaphragmatic hernia 05/20/2007  . Diverticulosis of colon 05/20/2007  . IBS 05/20/2007  . Fatty liver  05/20/2007  . Hypothyroidism 05/20/2007  . Hx of transient ischemic attack (TIA) 05/20/2007    Orientation RESPIRATION BLADDER Height & Weight     Self, Time, Place  O2(3 liters ) Incontinent Weight: 243 lb 9.7 oz (110.5 kg) Height:  5\' 2"  (157.5 cm)  BEHAVIORAL SYMPTOMS/MOOD NEUROLOGICAL BOWEL NUTRITION STATUS  (none) (none) Continent Diet(Carb modified )  AMBULATORY STATUS COMMUNICATION OF NEEDS Skin   Extensive Assist Verbally Normal                       Personal Care Assistance Level of Assistance  Bathing, Feeding, Dressing Bathing Assistance: Limited assistance Feeding assistance: Independent Dressing Assistance: Limited assistance     Functional Limitations Info  Sight, Hearing, Speech Sight Info: Adequate Hearing Info: Adequate Speech Info: Adequate    SPECIAL CARE FACTORS FREQUENCY  PT (By licensed PT), OT (By licensed OT)                    Contractures Contractures Info: Not present    Additional Factors Info  Code Status, Allergies Code Status Info: Full Code  Allergies Info: Codeine            Current Medications (08/23/2017):  This is the current hospital active medication list Current Facility-Administered Medications  Medication Dose Route Frequency Provider Last Rate Last Dose  . 0.9 %  sodium chloride infusion   Intravenous Continuous Amelia Jo, MD 75 mL/hr at 08/23/17 1356    . acetaminophen (TYLENOL) tablet 650 mg  650 mg Oral Q6H PRN Amelia Jo, MD       Or  . acetaminophen (TYLENOL) suppository 650 mg  650 mg Rectal Q6H  PRN Amelia Jo, MD      . albuterol (PROVENTIL) (2.5 MG/3ML) 0.083% nebulizer solution 2.5 mg  2.5 mg Nebulization Q4H PRN Amelia Jo, MD      . ALPRAZolam Duanne Moron) tablet 0.25 mg  0.25 mg Oral QHS PRN Amelia Jo, MD      . aspirin EC tablet 325 mg  325 mg Oral Daily Amelia Jo, MD   325 mg at 08/23/17 0853  . bisacodyl (DULCOLAX) EC tablet 5 mg  5 mg Oral Daily PRN Amelia Jo, MD      .  docusate sodium (COLACE) capsule 100 mg  100 mg Oral BID Amelia Jo, MD   100 mg at 08/23/17 1194  . furosemide (LASIX) tablet 20 mg  20 mg Oral Daily Amelia Jo, MD   20 mg at 08/23/17 1740  . gabapentin (NEURONTIN) capsule 300 mg  300 mg Oral QHS Amelia Jo, MD   300 mg at 08/23/17 0124  . heparin ADULT infusion 100 units/mL (25000 units/273mL sodium chloride 0.45%)  1,150 Units/hr Intravenous Continuous Vaughan Basta, MD 11.5 mL/hr at 08/23/17 1206 1,150 Units/hr at 08/23/17 1206  . insulin aspart (novoLOG) injection 0-15 Units  0-15 Units Subcutaneous TID WC Amelia Jo, MD      . insulin aspart (novoLOG) injection 0-5 Units  0-5 Units Subcutaneous QHS Amelia Jo, MD      . insulin detemir (LEVEMIR) injection 10 Units  10 Units Subcutaneous Daily Amelia Jo, MD   10 Units at 08/23/17 [phone]  . isosorbide mononitrate (IMDUR) 24 hr tablet 60 mg  60 mg Oral Daily Amelia Jo, MD   60 mg at 08/23/17 8185  . levETIRAcetam (KEPPRA) tablet 1,000 mg  1,000 mg Oral BID Amelia Jo, MD   1,000 mg at 08/23/17 1237  . loratadine (CLARITIN) tablet 10 mg  10 mg Oral Daily PRN Amelia Jo, MD      . Melatonin TABS 2.5 mg  2.5 mg Oral QHS Amelia Jo, MD      . metoprolol tartrate (LOPRESSOR) tablet 50 mg  50 mg Oral Daily Amelia Jo, MD   50 mg at 08/23/17 0851  . ondansetron (ZOFRAN) tablet 4 mg  4 mg Oral Q6H PRN Amelia Jo, MD       Or  . ondansetron [facility]) injection 4 mg  4 mg Intravenous Q6H PRN Amelia Jo, MD      . rosuvastatin (CRESTOR) tablet 20 mg  20 mg Oral QPM Vaughan Basta, MD      . sodium chloride (OCEAN) 0.65 % nasal spray 2 spray  2 spray Each Nare TID Amelia Jo, MD   2 spray at 08/23/17 1208  . traMADol (ULTRAM) tablet 50 mg  50 mg Oral Q6H PRN Amelia Jo, MD   50 mg at 08/23/17 1456  . traZODone (DESYREL) tablet 25 mg  25 mg Oral QHS PRN Amelia Jo, MD   25 mg at 08/23/17 0124     Discharge Medications: Please see discharge  summary for a list of discharge medications.  Relevant Imaging Results:  Relevant Lab Results:   Additional Information    [name]  Louretta Shorten, LCSWA

## 2017-08-23 NOTE — Progress Notes (Signed)
ANTICOAGULATION CONSULT NOTE - Initial Consult  Pharmacy Consult for heparin drip Indication: chest pain/ACS  Allergies  Allergen Reactions  . Codeine Itching    Patient Measurements: Height: 5\' 2"  (157.5 cm) Weight: 243 lb 9.7 oz (110.5 kg) IBW/kg (Calculated) : 50.1 Heparin Dosing Weight: 77 kg  Vital Signs: Temp: 97.4 F (36.3 C) (08/20 0805) Temp Source: Oral (08/20 0400) BP: 106/67 (08/20 0805) Pulse Rate: 94 (08/20 0805)  Labs: Recent Labs    08/22/17 2231 08/22/17 2232 08/23/17 0251 08/23/17 0903  HGB 9.1*  --  8.8*  --   HCT 27.4*  --  26.8*  --   PLT 187  --  181  --   APTT  --  33  --   --   LABPROT  --  14.1  --   --   INR  --  1.10  --   --   HEPARINUNFRC  --   --   --  <0.10*  CREATININE 1.18*  --  1.32*  --   TROPONINI 1.29*  --  1.24*  --     Estimated Creatinine Clearance: 45.2 mL/min (A) (by C-G formula based on SCr of 1.32 mg/dL (H)).   Medical History: Past Medical History:  Diagnosis Date  . Anxiety   . Coronary artery disease   . Depression   . Diabetes mellitus    insulin dependent  . Edema of lower extremity   . Esophageal stricture 2008   EGD  . Fatigue   . GERD (gastroesophageal reflux disease)   . Heart murmur   . Hiatal hernia 2008   EGD  . Hyperlipidemia   . Hypertension   . Knee pain   . Morbid obesity ([place])   . Nonproductive cough    chronic  . Orthopnea   . Seizure disorder ([place])   . SOB (shortness of breath)   . Syncope and collapse   . Tremor     Medications:  No anticoagulation in PTA meds  Assessment: Trop 1.29   4000 unit bolus and initial rate of 900 units/hr. First heparin level 8 hours after start of infusion.  Goal of Therapy:  Heparin level 0.3-0.7 units/ml Monitor platelets by anticoagulation protocol: Yes   Plan:  08/23/17 11:33 HL subtherapeutic x 1. Confirmed with Beth RN no interruption to infusion. 2300 units IV x 1 bolus and increase rate to 1150 units/hr. Recheck HL in 8  hours.  Laural Benes, Pharm.D., BCPS Clinical Pharmacist 08/23/2017,11:33 AM

## 2017-08-23 NOTE — Progress Notes (Signed)
[facility] NAME: [name]    MR#:  [id number]  DATE OF BIRTH:  1945/10/27  SUBJECTIVE:  CHIEF COMPLAINT:   Chief Complaint  Patient presents with  . Abnormal Lab  . Chest Pain    came with chest pain, extensive cardiac history- troponin is elevated. On heparine drip, no much pain now.  REVIEW OF SYSTEMS:  CONSTITUTIONAL: No fever, fatigue or weakness.  EYES: No blurred or double vision.  EARS, NOSE, AND THROAT: No tinnitus or ear pain.  RESPIRATORY: No cough, shortness of breath, wheezing or hemoptysis.  CARDIOVASCULAR: No chest pain, orthopnea, edema.  GASTROINTESTINAL: No nausea, vomiting, diarrhea or abdominal pain.  GENITOURINARY: No dysuria, hematuria.  ENDOCRINE: No polyuria, nocturia,  HEMATOLOGY: No anemia, easy bruising or bleeding SKIN: No rash or lesion. MUSCULOSKELETAL: No joint pain or arthritis.   NEUROLOGIC: No tingling, numbness, weakness.  PSYCHIATRY: No anxiety or depression.   ROS  DRUG ALLERGIES:   Allergies  Allergen Reactions  . Codeine Itching    VITALS:  Blood pressure (!) 106/49, pulse (!) 59, temperature (!) 97.4 F (36.3 C), resp. rate 16, height 5\' 2"  (1.575 m), weight 110.5 kg, SpO2 99 %.  PHYSICAL EXAMINATION:  GENERAL:  72 y.o.-year-old patient lying in the bed with no acute distress.  EYES: Pupils equal, round, reactive to light and accommodation. No scleral icterus. Extraocular muscles intact.  HEENT: Head atraumatic, normocephalic. Oropharynx and nasopharynx clear.  NECK:  Supple, no jugular venous distention. No thyroid enlargement, no tenderness.  LUNGS: Normal breath sounds bilaterally, no wheezing, rales,rhonchi or crepitation. No use of accessory muscles of respiration.  CARDIOVASCULAR: S1, S2 normal. No murmurs, rubs, or gallops.  ABDOMEN: Soft, nontender, nondistended. Bowel sounds present. No organomegaly or mass.  EXTREMITIES: No pedal edema, cyanosis, or clubbing.   NEUROLOGIC: Cranial nerves II through XII are intact. Muscle strength 5/5 in all extremities. Sensation intact. Gait not checked.  PSYCHIATRIC: The patient is alert and oriented x 3.  SKIN: No obvious rash, lesion, or ulcer.   Physical Exam LABORATORY PANEL:   CBC Recent Labs  Lab 08/23/17 0251  WBC 4.2  HGB 8.8*  HCT 26.8*  PLT 181   ------------------------------------------------------------------------------------------------------------------  Chemistries  Recent Labs  Lab 08/23/17 0251  NA 142  K 3.8  CL 104  CO2 33*  GLUCOSE 177*  BUN 33*  CREATININE 1.32*  CALCIUM 8.3*   ------------------------------------------------------------------------------------------------------------------  Cardiac Enzymes Recent Labs  Lab 08/23/17 1117 08/23/17 1524  TROPONINI 1.24* 1.23*   ------------------------------------------------------------------------------------------------------------------  RADIOLOGY:  Dg Chest Port 1 View  Result Date: 08/22/2017 CLINICAL DATA:  Chest pain EXAM: PORTABLE CHEST 1 VIEW COMPARISON:  None. FINDINGS: Mild cardiomegaly. Low lung volumes. No consolidation or effusion. No pneumothorax. IMPRESSION: No active disease.  Cardiomegaly. Electronically Signed   By: Donavan Foil M.D.   On: 08/22/2017 22:46    ASSESSMENT AND PLAN:   Active Problems:   NSTEMI (non-ST elevated myocardial infarction) ([place])   1.  Non-ST elevation MI.  Troponin level is elevated at 1.29.    on aspirin and heparin drip.  Cardiology is consulted for further evaluation and treatment.  she ad multiple cardiac issues in past and stents.  2.  CKD 3.  Creatinine is stable, at 1.18, which seems to be the baseline for this patient.  Continue to monitor kidney function closely and avoid nephrotoxic medications.  3.  Diabetes type 2.  Will monitor blood sugars before meals and at bedtime.  Will use insulin treatment during the hospital stay.  4.  Ch. Diastolic  CHF, currently clinically compensated.  Continue medical treatment.  5.  Hypertension, stable, will restart home medications.  6.  Chronic normocytic anemia, mostly anemia of chronic diseases.  Hemoglobin level is stable at 9.1.  No active bleeding.  Continue to monitor hemoglobin level closely.    All the records are reviewed and case discussed with Care Management/Social Workerr. Management plans discussed with the patient, family and they are in agreement.  CODE STATUS: Full.  TOTAL TIME TAKING CARE OF THIS PATIENT: 35 minutes.   Husband is in room during my visit.  POSSIBLE D/C IN 1-2 DAYS, DEPENDING ON CLINICAL CONDITION.   Vaughan Basta M.D on 08/23/2017   Between 7am to 6pm - Pager - [phone]  After 6pm go to [email or web address] - password [email or web address] [facility]  Office  [phone]  CC: Primary care physician; Briscoe Deutscher, DO  Note: This dictation was prepared with Dragon dictation along with smaller phrase technology. Any transcriptional errors that result from this process are unintentional.

## 2017-08-23 NOTE — Progress Notes (Signed)
ANTICOAGULATION CONSULT NOTE - Initial Consult  Pharmacy Consult for heparin drip Indication: chest pain/ACS  Allergies  Allergen Reactions  . Codeine Itching    Patient Measurements: Height: 5\' 2"  (157.5 cm) Weight: 243 lb 9.7 oz (110.5 kg) IBW/kg (Calculated) : 50.1 Heparin Dosing Weight: 77 kg  Vital Signs: Temp: 97.8 F (36.6 C) (08/20 1918) Temp Source: Oral (08/20 1918) BP: 111/52 (08/20 1918) Pulse Rate: 59 (08/20 1918)  Labs: Recent Labs    08/22/17 2231 08/22/17 2232 08/23/17 0251 08/23/17 0903 08/23/17 1117 08/23/17 1524 08/23/17 2021  HGB 9.1*  --  8.8*  --   --   --   --   HCT 27.4*  --  26.8*  --   --   --   --   PLT 187  --  181  --   --   --   --   APTT  --  33  --   --   --   --   --   LABPROT  --  14.1  --   --   --   --   --   INR  --  1.10  --   --   --   --   --   HEPARINUNFRC  --   --   --  <0.10*  --   --  0.31  CREATININE 1.18*  --  1.32*  --   --   --   --   TROPONINI 1.29*  --  1.24*  --  1.24* 1.23* 1.22*    Estimated Creatinine Clearance: 45.2 mL/min (A) (by C-G formula based on SCr of 1.32 mg/dL (H)).   Medical History: Past Medical History:  Diagnosis Date  . Anxiety   . Coronary artery disease   . Depression   . Diabetes mellitus    insulin dependent  . Edema of lower extremity   . Esophageal stricture 2008   EGD  . Fatigue   . GERD (gastroesophageal reflux disease)   . Heart murmur   . Hiatal hernia 2008   EGD  . Hyperlipidemia   . Hypertension   . Knee pain   . Morbid obesity ([place])   . Nonproductive cough    chronic  . Orthopnea   . Seizure disorder ([place])   . SOB (shortness of breath)   . Syncope and collapse   . Tremor     Medications:  No anticoagulation in PTA meds  Assessment: Trop 1.29   4000 unit bolus and initial rate of 900 units/hr. First heparin level 8 hours after start of infusion.  08/23/17 11:33 HL subtherapeutic x 1. Confirmed with Beth RN no interruption to infusion. 2300 units IV x 1  bolus and increase rate to 1150 units/hr. Recheck HL in 8 hours.  Goal of Therapy:  Heparin level 0.3-0.7 units/ml Monitor platelets by anticoagulation protocol: Yes   Plan:  08/20:  HL @ 20:21 = 0.31 Will continue pt on current rate and recheck HL on 8/21 with AM labs.    Allecia Bells D, Pharm.D Clinical Pharmacist 08/23/2017,9:16 PM

## 2017-08-23 NOTE — Progress Notes (Signed)
Family Meeting Note  Advance Directive:yes  Today a meeting took place with the Patient and spouse.  The following clinical team members were present during this meeting:MD  The following were discussed:Patient's diagnosis:NSTEMI, CAD, Htn , Patient's progosis: Unable to determine and Goals for treatment: Full Code  Additional follow-up to be provided: cardiology.  Time spent during discussion:20 minutes  Vaughan Basta, MD

## 2017-08-23 NOTE — Progress Notes (Signed)
Pt resting in bed with no acute distress. Denies pain or discomfort.

## 2017-08-24 ENCOUNTER — Ambulatory Visit: Payer: Medicare Other | Admitting: Adult Health

## 2017-08-24 LAB — GLUCOSE, CAPILLARY
GLUCOSE-CAPILLARY: 76 mg/dL (ref 70–99)
GLUCOSE-CAPILLARY: 126 mg/dL — AB (ref 70–99)
GLUCOSE-CAPILLARY: 112 mg/dL — AB (ref 70–99)
Glucose-Capillary: 97 mg/dL (ref 70–99)

## 2017-08-24 LAB — HEPARIN LEVEL (UNFRACTIONATED): Heparin Unfractionated: 0.45 IU/mL (ref 0.30–0.70)

## 2017-08-24 MED ORDER — CLOPIDOGREL BISULFATE 75 MG PO TABS
75.0000 mg | ORAL_TABLET | Freq: Every day | ORAL | Status: DC
  Administered 2017-08-24 – 2017-08-25 (×2): 75 mg via ORAL
  Filled 2017-08-24 (×2): qty 1

## 2017-08-24 NOTE — Progress Notes (Signed)
ANTICOAGULATION CONSULT NOTE - Initial Consult  Pharmacy Consult for heparin drip Indication: chest pain/ACS  Allergies  Allergen Reactions  . Codeine Itching    Patient Measurements: Height: 5\' 2"  (157.5 cm) Weight: 256 lb 4.8 oz (116.3 kg) IBW/kg (Calculated) : 50.1 Heparin Dosing Weight: 77 kg  Vital Signs: Temp: 97.5 F (36.4 C) (08/21 0459) Temp Source: Oral (08/21 0459) BP: 110/55 (08/21 0459) Pulse Rate: 66 (08/21 0459)  Labs: Recent Labs    08/22/17 2231 08/22/17 2232 08/23/17 0251 08/23/17 0903 08/23/17 1117 08/23/17 1524 08/23/17 2021 08/24/17 0436  HGB 9.1*  --  8.8*  --   --   --   --   --   HCT 27.4*  --  26.8*  --   --   --   --   --   PLT 187  --  181  --   --   --   --   --   APTT  --  33  --   --   --   --   --   --   LABPROT  --  14.1  --   --   --   --   --   --   INR  --  1.10  --   --   --   --   --   --   HEPARINUNFRC  --   --   --  <0.10*  --   --  0.31 0.45  CREATININE 1.18*  --  1.32*  --   --   --   --   --   TROPONINI 1.29*  --  1.24*  --  1.24* 1.23* 1.22*  --     Estimated Creatinine Clearance: 46.6 mL/min (A) (by C-G formula based on SCr of 1.32 mg/dL (H)).   Medical History: Past Medical History:  Diagnosis Date  . Anxiety   . Coronary artery disease   . Depression   . Diabetes mellitus    insulin dependent  . Edema of lower extremity   . Esophageal stricture 2008   EGD  . Fatigue   . GERD (gastroesophageal reflux disease)   . Heart murmur   . Hiatal hernia 2008   EGD  . Hyperlipidemia   . Hypertension   . Knee pain   . Morbid obesity ([place])   . Nonproductive cough    chronic  . Orthopnea   . Seizure disorder ([place])   . SOB (shortness of breath)   . Syncope and collapse   . Tremor     Medications:  No anticoagulation in PTA meds  Assessment: Trop 1.29   4000 unit bolus and initial rate of 900 units/hr. First heparin level 8 hours after start of infusion.  08/23/17 11:33 HL subtherapeutic x 1. Confirmed  with Beth RN no interruption to infusion. 2300 units IV x 1 bolus and increase rate to 1150 units/hr. Recheck HL in 8 hours.  Goal of Therapy:  Heparin level 0.3-0.7 units/ml Monitor platelets by anticoagulation protocol: Yes   Plan:  08/20:  HL @ 20:21 = 0.31 Will continue pt on current rate and recheck HL on 8/21 with AM labs.   8/21 AM heparin level 0.45. Continue current regimen. Recheck heparin level and CBC with tomorrow AM labs.   Machaela Caterino S, Pharm.D Clinical Pharmacist 08/24/2017,5:44 AM

## 2017-08-24 NOTE — Plan of Care (Signed)
Patient presently in the bed, alert and confused at this time, denies any chest pain or discomfort at this time  Problem: Pain Managment: Goal: General experience of comfort will improve Outcome: Progressing   Problem: Coping: Goal: Level of anxiety will decrease Outcome: Progressing   Problem: Clinical Measurements: Goal: Cardiovascular complication will be avoided Outcome: Progressing

## 2017-08-24 NOTE — Care Management Note (Signed)
Case Management Note  Patient Details  Name: [name] MRN: [id number] Date of Birth: December 26, 1945  Subjective/Objective:                 Patient admitted from [facility] in [place].  She resides on [address] with her husband.  Patient says she has been in a skilled nursing facility for about 4 weeks.  She is declning return to the facility at discharge and wishes to return home with home health.  Says she has home 02. Patient's husband has reported to [place] and attending that patient can not return home- she must go back to the facility.  He was informed that patient has not been declared incompetent and can not be forced to returned to the facility against her will.    She has not been out of bed since admission and has an external catheter in place. Discussed with primary nurse to remove it.  Order being obtained for PT consult as patient with max assist not able to sit up in bed   Action/Plan:  Made a heads up referral to [facility] who can provide service in [place] in the event patient refuses to return to skilled nursing facility.  Obtaining physical therapy consult  Expected Discharge Date:                  Expected Discharge Plan:     In-House Referral:     Discharge planning Services     Post Acute Care Choice:    Choice offered to:     DME Arranged:    DME Agency:     HH Arranged:    HH Agency:     Status of Service:     If discussed at [employer] [facility] [employer], dates discussed:    Additional Comments:  Katrina Stack, RN 08/24/2017, 3:32 PM

## 2017-08-24 NOTE — Consult Note (Signed)
Reason for Consult: Elevated troponin chest pain shortness of breath known coronary disease Referring Physician: Dr. Anselm Jungling hospitalist Dr. Briscoe Deutscher primary  [name] is an 72 y.o. female.  HPI: Patient's a 72 year old obese white female with multiple medical problems including coronary disease multiple stents anxiety coronary disease diabetes lower extremity edema GERD hypertension presented as a transfer from the nursing home with not feeling well chest pain patient has had intermittent symptoms on and off requiring sublingual nitroglycerin patient initially refused to come to emergency room then eventually agreed initial troponins were borderline subsequently elevated to 1.29 but stayed relatively consistent at about 1.24.  Patient feels somewhat better with shortness of breath EKG is nonspecific.  Patient is currently pain-free feels much better  Past Medical History:  Diagnosis Date  . Anxiety   . Coronary artery disease   . Depression   . Diabetes mellitus    insulin dependent  . Edema of lower extremity   . Esophageal stricture 2008   EGD  . Fatigue   . GERD (gastroesophageal reflux disease)   . Heart murmur   . Hiatal hernia 2008   EGD  . Hyperlipidemia   . Hypertension   . Knee pain   . Morbid obesity ([place])   . Nonproductive cough    chronic  . Orthopnea   . Seizure disorder ([place])   . SOB (shortness of breath)   . Syncope and collapse   . Tremor     Past Surgical History:  Procedure Laterality Date  . ABDOMINAL HYSTERECTOMY    . CARDIAC CATHETERIZATION  03/28/2009  . KNEE ARTHROSCOPY    . TRANSTHORACIC ECHOCARDIOGRAM     showed ef of 65% with no regional wall motion abnormalities    Family History  Problem Relation Age of Onset  . Heart attack Mother   . Hypertension Mother   . COPD Father   . Heart disease Sister   . Diabetes Brother   . Diabetes Brother   . Prostate cancer Brother   . Kidney disease Brother     Social History:  reports that  she quit smoking about 47 years ago. She has never used smokeless tobacco. She reports that she does not drink alcohol or use drugs.  Allergies:  Allergies  Allergen Reactions  . Codeine Itching    Medications: I have reviewed the patient's current medications.  Results for orders placed or performed during the hospital encounter of 08/22/17 (from the past 48 hour(s))  CBC     Status: Abnormal   Collection Time: 08/22/17 10:31 PM  Result Value Ref Range   WBC 4.5 3.6 - 11.0 K/uL   RBC 2.95 (L) 3.80 - 5.20 MIL/uL   Hemoglobin 9.1 (L) 12.0 - 16.0 g/dL   HCT 27.4 (L) 35.0 - 47.0 %   MCV 92.9 80.0 - 100.0 fL   MCH 30.9 26.0 - 34.0 pg   MCHC 33.2 32.0 - 36.0 g/dL   RDW 19.2 (H) 11.5 - 14.5 %   Platelets 187 150 - 440 K/uL    Comment: Performed at [facility], [address] [place], [place] [address]  Basic metabolic panel     Status: Abnormal   Collection Time: 08/22/17 10:31 PM  Result Value Ref Range   Sodium 140 135 - 145 mmol/L   Potassium 3.7 3.5 - 5.1 mmol/L   Chloride 105 98 - 111 mmol/L   CO2 29 22 - 32 mmol/L   Glucose, Bld 190 (H) 70 - 99 mg/dL  BUN 31 (H) 8 - 23 mg/dL   Creatinine, Ser 1.18 (H) 0.44 - 1.00 mg/dL   Calcium 7.6 (L) 8.9 - 10.3 mg/dL   GFR calc non Af Amer 45 (L) >60 mL/min   GFR calc Af Amer 52 (L) >60 mL/min    Comment: (NOTE) The eGFR has been calculated using the CKD EPI equation. This calculation has not been validated in all clinical situations. eGFR's persistently <60 mL/min signify possible Chronic Kidney Disease.    Anion gap 6 5 - 15    Comment: Performed at [facility], [address] [place], [place] [address]  Troponin I     Status: Abnormal   Collection Time: 08/22/17 10:31 PM  Result Value Ref Range   Troponin I 1.29 (HH) <0.03 ng/mL    Comment: CRITICAL RESULT CALLED TO, READ BACK BY AND VERIFIED WITH DAWN [facility] '@2310'  08/22/17 [facility] Performed at [facility], [address], [place], [place]  [address]   Protime-INR     Status: None   Collection Time: 08/22/17 10:32 PM  Result Value Ref Range   Prothrombin Time 14.1 11.4 - 15.2 seconds   INR 1.10     Comment: Performed at [facility], [address] [place], [place] [address]  APTT     Status: None   Collection Time: 08/22/17 10:32 PM  Result Value Ref Range   aPTT 33 24 - 36 seconds    Comment: Performed at [facility], [address] [place], [place] [address]  MRSA PCR Screening     Status: None   Collection Time: 08/23/17  1:08 AM  Result Value Ref Range   MRSA by PCR NEGATIVE NEGATIVE    Comment:        The GeneXpert MRSA Assay (FDA approved for NASAL specimens only), is one component of a comprehensive MRSA colonization surveillance program. It is not intended to diagnose MRSA infection nor to guide or monitor treatment for MRSA infections. Performed at [facility], [address] [place], [place] [address]   Glucose, capillary     Status: Abnormal   Collection Time: 08/23/17  1:15 AM  Result Value Ref Range   Glucose-Capillary 198 (H) 70 - 99 mg/dL   Comment 1 Notify RN    Comment 2 Document in Chart   Basic metabolic panel     Status: Abnormal   Collection Time: 08/23/17  2:51 AM  Result Value Ref Range   Sodium 142 135 - 145 mmol/L   Potassium 3.8 3.5 - 5.1 mmol/L   Chloride 104 98 - 111 mmol/L   CO2 33 (H) 22 - 32 mmol/L   Glucose, Bld 177 (H) 70 - 99 mg/dL   BUN 33 (H) 8 - 23 mg/dL   Creatinine, Ser 1.32 (H) 0.44 - 1.00 mg/dL   Calcium 8.3 (L) 8.9 - 10.3 mg/dL   GFR calc non Af Amer 39 (L) >60 mL/min   GFR calc Af Amer 45 (L) >60 mL/min    Comment: (NOTE) The eGFR has been calculated using the CKD EPI equation. This calculation has not been validated in all clinical situations. eGFR's persistently <60 mL/min signify possible Chronic Kidney Disease.    Anion gap 5 5 - 15    Comment: Performed at [facility], [address] [place], [place]  [address]  CBC     Status: Abnormal   Collection Time: 08/23/17  2:51 AM  Result Value Ref Range   WBC 4.2 3.6 -  11.0 K/uL   RBC 2.88 (L) 3.80 - 5.20 MIL/uL   Hemoglobin 8.8 (L) 12.0 - 16.0 g/dL   HCT 26.8 (L) 35.0 - 47.0 %   MCV 93.2 80.0 - 100.0 fL   MCH 30.6 26.0 - 34.0 pg   MCHC 32.9 32.0 - 36.0 g/dL   RDW 19.1 (H) 11.5 - 14.5 %   Platelets 181 150 - 440 K/uL    Comment: Performed at [facility], [address] [place], [place] [address]  Troponin I     Status: Abnormal   Collection Time: 08/23/17  2:51 AM  Result Value Ref Range   Troponin I 1.24 (HH) <0.03 ng/mL    Comment: CRITICAL VALUE NOTED. VALUE IS CONSISTENT WITH PREVIOUSLY REPORTED/CALLED VALUE / JAG Performed at [facility], [place], [place] [address]   Glucose, capillary     Status: Abnormal   Collection Time: 08/23/17  8:04 AM  Result Value Ref Range   Glucose-Capillary 104 (H) 70 - 99 mg/dL   Comment 1 Notify RN   Heparin level (unfractionated)     Status: Abnormal   Collection Time: 08/23/17  9:03 AM  Result Value Ref Range   Heparin Unfractionated <0.10 (L) 0.30 - 0.70 IU/mL    Comment: RESULT REPEATED AND VERIFIED (NOTE) If heparin results are below expected values, and patient dosage has  been confirmed, suggest follow up testing of antithrombin III levels. Performed at [facility], [address] [place], [place] [address]   Troponin I     Status: Abnormal   Collection Time: 08/23/17 11:17 AM  Result Value Ref Range   Troponin I 1.24 (HH) <0.03 ng/mL    Comment: CRITICAL VALUE NOTED. VALUE IS CONSISTENT WITH PREVIOUSLY REPORTED/CALLED VALUE. QSD Performed at [facility], [place], [place] [address]   Glucose, capillary     Status: None   Collection Time: 08/23/17 11:58 AM  Result Value Ref Range   Glucose-Capillary 95 70 - 99 mg/dL   Comment 1 Notify RN   Troponin I     Status: Abnormal   Collection Time: 08/23/17  3:24 PM   Result Value Ref Range   Troponin I 1.23 (HH) <0.03 ng/mL    Comment: CRITICAL VALUE NOTED. VALUE IS CONSISTENT WITH PREVIOUSLY REPORTED/CALLED VALUE AKT Performed at [facility], [place], [place] [address]   Glucose, capillary     Status: Abnormal   Collection Time: 08/23/17  4:36 PM  Result Value Ref Range   Glucose-Capillary 108 (H) 70 - 99 mg/dL   Comment 1 Notify RN   Troponin I     Status: Abnormal   Collection Time: 08/23/17  8:21 PM  Result Value Ref Range   Troponin I 1.22 (HH) <0.03 ng/mL    Comment: CRITICAL VALUE NOTED. VALUE IS CONSISTENT WITH PREVIOUSLY REPORTED/CALLED VALUE AKT Performed at [facility], [place], [place] [address]   Heparin level (unfractionated)     Status: None   Collection Time: 08/23/17  8:21 PM  Result Value Ref Range   Heparin Unfractionated 0.31 0.30 - 0.70 IU/mL    Comment: (NOTE) If heparin results are below expected values, and patient dosage has  been confirmed, suggest follow up testing of antithrombin III levels. Performed at [facility], [address] [place], [place] [address]   Glucose, capillary     Status: Abnormal   Collection Time: 08/23/17  8:35 PM  Result Value  Ref Range   Glucose-Capillary 131 (H) 70 - 99 mg/dL   Comment 1 Notify RN    Comment 2 Document in Chart   Heparin level (unfractionated)     Status: None   Collection Time: 08/24/17  4:36 AM  Result Value Ref Range   Heparin Unfractionated 0.45 0.30 - 0.70 IU/mL    Comment: (NOTE) If heparin results are below expected values, and patient dosage has  been confirmed, suggest follow up testing of antithrombin III levels. Performed at [facility], [address] [place], [place] [address]   Glucose, capillary     Status: None   Collection Time: 08/24/17  8:00 AM  Result Value Ref Range   Glucose-Capillary 76 70 - 99 mg/dL    Dg Chest Port 1 View  Result Date: 08/22/2017 CLINICAL  DATA:  Chest pain EXAM: PORTABLE CHEST 1 VIEW COMPARISON:  None. FINDINGS: Mild cardiomegaly. Low lung volumes. No consolidation or effusion. No pneumothorax. IMPRESSION: No active disease.  Cardiomegaly. Electronically Signed   By: Donavan Foil M.D.   On: 08/22/2017 22:46    Review of Systems  Constitutional: Positive for diaphoresis and malaise/fatigue.  HENT: Positive for congestion.   Eyes: Negative.   Respiratory: Positive for shortness of breath.   Cardiovascular: Positive for chest pain, leg swelling and PND.  Gastrointestinal: Positive for heartburn.  Genitourinary: Negative.   Musculoskeletal: Positive for myalgias.  Skin: Negative.   Neurological: Positive for weakness.  Endo/Heme/Allergies: Negative.   Psychiatric/Behavioral: The patient is nervous/anxious.    Blood pressure 134/67, pulse 77, temperature 98.2 F (36.8 C), temperature source Oral, resp. rate 18, height '5\' 2"'  (1.575 m), weight 116.3 kg, SpO2 100 %. Physical Exam  Nursing note and vitals reviewed. Constitutional: She is oriented to person, place, and time. She appears well-developed and well-nourished.  HENT:  Head: Normocephalic and atraumatic.  Eyes: Pupils are equal, round, and reactive to light. Conjunctivae and EOM are normal.  Neck: Normal range of motion. Neck supple.  Cardiovascular: Normal rate and regular rhythm.  Murmur heard. Respiratory: Effort normal and breath sounds normal.  GI: Soft. Bowel sounds are normal.  Musculoskeletal: Normal range of motion.  Neurological: She is alert and oriented to person, place, and time.  Skin: Skin is warm.  Psychiatric: She has a normal mood and affect.    Assessment/Plan: Elevated troponin Chest pain Known coronary disease Obesity History of congestive heart failure Abnormal EKG History of smoking Anxiety Edema Diabetes GERD . Plan Agree with admit rule out microinfarction Agree with continued medical therapy Agree with lipid management  with Crestor Continue hypertension management and control Consider echocardiogram Do not recommend invasive strategy at this point Recommend conservative short-term anticoagulation Maintain patient on aspirin and Plavix Consider sleep study possible obstructive sleep apnea Recommend have the patient follow-up with primary cardiologist in 1 to 2 weeks after discharge  Mayer Vondrak D Moani Weipert 08/24/2017, 9:27 AM

## 2017-08-24 NOTE — Clinical Social Work Note (Signed)
Clinical Social Work Assessment  Patient Details  Name: [name] MRN: [id number] Date of Birth: 1945-02-21  Date of referral:  08/24/17               Reason for consult:  Facility Placement                Permission sought to share information with:  Case Manager, Customer service manager, Family Supports Permission granted to share information::  Yes, Verbal Permission Granted  Name::        Agency::     Relationship::     Contact Information:     Housing/Transportation Living arrangements for the past 2 months:  [place] of Information:  Patient Patient Interpreter Needed:  None Criminal Activity/Legal Involvement Pertinent to Current Situation/Hospitalization:  No - Comment as needed Significant Relationships:  Adult Children, Spouse Lives with:  Spouse Do you feel safe going back to the place where you live?  Yes Need for family participation in patient care:  Yes (Comment)  Care giving concerns:  Patient admitted from [facility].    Social Worker assessment / plan:  CSW noted in chart review that patient is from [facility]. CSW met with patient to discuss discharge plan. CSW introduced self and explained role. Patient states that she was at [facility] for short term rehab and she normally lives with her husband. Patient states that she does not want to go back to SNF and would like to return home. Patient states that she also has a daughter that helps her. CSW spoke with patient's husband [name] [phone] regarding patient. Husband states that patient can not return home because he is unable to care for her. Husband would like patient to go to SNF again. CSW explained that we can not force patient to go if she does not want to. CSW explained that her family can try and convince her to go back but that staff can not force it. Husband states that he understands and he would like patient to ultimately go to live with their  daughter but she is not ready for that. CSW did send referral to [facility] in case patient decides she would like to continue at [facility]. Husband states that they are paying to hold the bed there at this time and he will try to convince patient to return to SNF. CSW made RN CM aware that patient is currently refusing SNF and would need home health if she goes home. CSW will continue to follow for discharge planning.   Employment status:  Retired Forensic scientist:  Medicare PT Recommendations:  Not assessed at this time [place] / Referral to community resources:  [place]  Patient/Family's Response to care:  Patient thanked CSW for assistance   Patient/Family's Understanding of and Emotional Response to Diagnosis, Current Treatment, and Prognosis:  Patient does not seem to understand that she needs more assistance than family can provide at this time.   Emotional Assessment Appearance:  Appears stated age Attitude/Demeanor/Rapport:    Affect (typically observed):  Pleasant, Accepting, Restless Orientation:  Oriented to Self, Oriented to Place, Oriented to  Time Alcohol / Substance use:  Not Applicable Psych involvement (Current and /or in the community):  No (Comment)  Discharge Needs  Concerns to be addressed:  Discharge Planning Concerns Readmission within the last 30 days:  Yes Current discharge risk:  None Barriers to Discharge:  Continued Medical Work up   [employer], [place] 08/24/2017, 10:21 AM

## 2017-08-25 LAB — GLUCOSE, CAPILLARY
GLUCOSE-CAPILLARY: 111 mg/dL — AB (ref 70–99)
Glucose-Capillary: 81 mg/dL (ref 70–99)
Glucose-Capillary: 140 mg/dL — ABNORMAL HIGH (ref 70–99)

## 2017-08-25 LAB — BASIC METABOLIC PANEL
Anion gap: 5 (ref 5–15)
BUN: 24 mg/dL — AB (ref 8–23)
CALCIUM: 8.8 mg/dL — AB (ref 8.9–10.3)
CO2: 31 mmol/L (ref 22–32)
CREATININE: 0.98 mg/dL (ref 0.44–1.00)
Chloride: 105 mmol/L (ref 98–111)
GFR, EST NON AFRICAN AMERICAN: 56 mL/min — AB (ref >60)
Glucose, Bld: 94 mg/dL (ref 70–99)
Potassium: 3.7 mmol/L (ref 3.5–5.1)
SODIUM: 141 mmol/L (ref 135–145)

## 2017-08-25 LAB — CBC
HCT: 28.2 % — ABNORMAL LOW (ref 35.0–47.0)
HEMOGLOBIN: 9.4 g/dL — AB (ref 12.0–16.0)
MCH: 30.7 pg (ref 26.0–34.0)
MCHC: 33.2 g/dL (ref 32.0–36.0)
MCV: 92.5 fL (ref 80.0–100.0)
PLATELETS: 220 10*3/uL (ref 150–440)
RBC: 3.05 MIL/uL — ABNORMAL LOW (ref 3.80–5.20)
RDW: 19.2 % — ABNORMAL HIGH (ref 11.5–14.5)
WBC: 4.7 10*3/uL (ref 3.6–11.0)

## 2017-08-25 LAB — HEPARIN LEVEL (UNFRACTIONATED)

## 2017-08-25 MED ORDER — FUROSEMIDE 20 MG PO TABS
20.0000 mg | ORAL_TABLET | Freq: Two times a day (BID) | ORAL | Status: DC
  Administered 2017-08-25: 20 mg via ORAL
  Filled 2017-08-25: qty 1

## 2017-08-25 MED ORDER — POTASSIUM CHLORIDE CRYS ER 10 MEQ PO TBCR: 10.0000 meq | EXTENDED_RELEASE_TABLET | Freq: Every day | ORAL | 0 refills | Status: DC

## 2017-08-25 MED ORDER — FUROSEMIDE 20 MG PO TABS: 20.0000 mg | ORAL_TABLET | Freq: Two times a day (BID) | ORAL | 0 refills | Status: DC

## 2017-08-25 MED ORDER — TRAMADOL HCL 50 MG PO TABS: 50.0000 mg | ORAL_TABLET | Freq: Four times a day (QID) | ORAL | 0 refills | Status: DC | PRN

## 2017-08-25 MED ORDER — ALPRAZOLAM 0.5 MG PO TABS: 0.5000 mg | ORAL_TABLET | Freq: Two times a day (BID) | ORAL | 0 refills | Status: DC

## 2017-08-25 MED ORDER — POTASSIUM CHLORIDE CRYS ER 10 MEQ PO TBCR
10.0000 meq | EXTENDED_RELEASE_TABLET | Freq: Every day | ORAL | Status: DC
  Administered 2017-08-25: 10 meq via ORAL
  Filled 2017-08-25: qty 1

## 2017-08-25 MED ORDER — LOSARTAN POTASSIUM 25 MG PO TABS
25.0000 mg | ORAL_TABLET | Freq: Every day | ORAL | Status: DC
  Administered 2017-08-25: 25 mg via ORAL
  Filled 2017-08-25: qty 1

## 2017-08-25 MED ORDER — ALPRAZOLAM 0.25 MG PO TABS: 0.2500 mg | ORAL_TABLET | Freq: Every evening | ORAL | 0 refills | Status: DC | PRN

## 2017-08-25 MED ORDER — LOSARTAN POTASSIUM 25 MG PO TABS: 25.0000 mg | ORAL_TABLET | Freq: Every day | ORAL | 0 refills | Status: DC

## 2017-08-25 MED ORDER — ROSUVASTATIN CALCIUM 20 MG PO TABS: 20.0000 mg | ORAL_TABLET | Freq: Every evening | ORAL | 0 refills | Status: DC

## 2017-08-25 NOTE — Evaluation (Signed)
Physical Therapy Evaluation Patient Details Name: [name] MRN: [id number] DOB: [birth year] Today's Date: 08/25/2017   History of Present Illness  72 year old female with IDDM, CAD, depression, anxiety, hiatal hernia/GERD, hypertension, hyperlipidemia, diastolic CHF, chronic hypoxic respiratory failure on home O2 2 L, seizure disorder who has been in/out of hospital/rehab for the last few months and has been very limited.   Clinical Impression  Pt showed eagerness to prove she could go home but ultimately displayed very poor mobility, strength and generally made it clear that she is completely unsafe to try to go home even with 24/7 assist.  She indicated that she could get up to sitting and don socks w/o help, but after multiple attempts with progressively increased HOB angle and rail use she still needed a lot of assist to get to sitting.  Once there she was able to maintain sitting balance, however she displayed complete inability to rise to standing w/o max assist from PT and even this was after multiple attempts and additional cuing/theraputic activity training just to briefly attain standing - pt unable to take even a small shuffling step w/o U&LE buckling and needing max assist to stay upright.  Again pt very much not wanting to go back to the rehab facility but is completely unsafe at home and PT laid out multiple reasons and scenarios to pt as to why this is so.    Follow Up Recommendations SNF    Equipment Recommendations  None recommended by PT    Recommendations for Other Services       Precautions / Restrictions Precautions Precautions: Fall Restrictions Weight Bearing Restrictions: No      Mobility  Bed Mobility Overal bed mobility: Needs Assistance Bed Mobility: Sidelying to Sit;Supine to Sit   Sidelying to sit: Mod assist Supine to sit: Mod assist;Max assist     General bed mobility comments: Pt initially rolled to side and started pulling up on rail as though  she could easily sit up, found that it was difficult but was determined to do it on her own.  Ultimately she was not close to being able to do it and needed considerable assist to get to sitting  Transfers Overall transfer level: Needs assistance Equipment used: Rolling walker (2 wheeled) Transfers: Sit to/from Stand Sit to Stand: Max assist         General transfer comment: multiple attempts at standing, initially she needed max assist and was unable to get fully upright on next attempt pt provided more set up, VCs and assistance and she managed to stand but has U&LE buckling and ultimately was completely unsafe  Ambulation/Gait             General Gait Details: Attempted to unweight R foot to slide it toward the middle and she needed max assist to stay upright and was unable to even heel-toe/shuffle LE to EOB.    Stairs            Wheelchair Mobility    Modified Rankin (Stroke Patients Only)       Balance Overall balance assessment: Needs assistance   Sitting balance-Leahy Scale: Fair       Standing balance-Leahy Scale: Poor Standing balance comment: Pt unable to tolerate standing w/o heavy assist from PT, unable to step away from bed but considering her buckling and weakness this would not have been safe to try anyway  Pertinent Vitals/Pain Pain Assessment: (c/o mild knee L knee pain)    Home Living Family/patient expects to be discharged to:: Skilled nursing facility                 Additional Comments: Pt very much wanting to avoid going back to her rehab facility, does realize she is not safe to go home    Prior Function Level of Independence: Needs assistance   Gait / Transfers Assistance Needed: Pt states that 2-3 months ago she was walking as much as she needed? reports she has been able to do very little at rehab           Hand Dominance        Extremity/Trunk Assessment   Upper Extremity  Assessment Upper Extremity Assessment: Generalized weakness(Pt showed functional AROM, but poor strength t/o)    Lower Extremity Assessment Lower Extremity Assessment: Generalized weakness       Communication   Communication: No difficulties  Cognition Arousal/Alertness: Awake/alert Behavior During Therapy: WFL for tasks assessed/performed Overall Cognitive Status: Within Functional Limits for tasks assessed                                        General Comments      Exercises     Assessment/Plan    PT Assessment Patient needs continued PT services  PT Problem List Decreased strength;Decreased range of motion;Decreased activity tolerance;Decreased balance;Decreased mobility;Decreased coordination;Decreased cognition;Decreased knowledge of use of DME;Decreased safety awareness       PT Treatment Interventions DME instruction;Gait training;Stair training;Functional mobility training;Therapeutic activities;Therapeutic exercise;Balance training;Neuromuscular re-education;Patient/family education    PT Goals (Current goals can be found in the Care Plan section)  Acute Rehab PT Goals Patient Stated Goal: not go back to rehab PT Goal Formulation: With patient Time For Goal Achievement: 09/08/17 Potential to Achieve Goals: Fair    Frequency Min 2X/week   Barriers to discharge        Co-evaluation               AM-PAC PT "6 Clicks" Daily Activity  Outcome Measure Difficulty turning over in bed (including adjusting bedclothes, sheets and blankets)?: A Lot Difficulty moving from lying on back to sitting on the side of the bed? : Unable Difficulty sitting down on and standing up from a chair with arms (e.g., wheelchair, bedside commode, etc,.)?: Unable Help needed moving to and from a bed to chair (including a wheelchair)?: Total Help needed walking in hospital room?: Total Help needed climbing 3-5 steps with a railing? : Total 6 Click Score: 7     End of Session Equipment Utilized During Treatment: Gait belt Activity Tolerance: Patient limited by fatigue Patient left: with bed alarm set;with call bell/phone within reach Nurse Communication: Mobility status PT Visit Diagnosis: Muscle weakness (generalized) (M62.81);Difficulty in walking, not elsewhere classified (R26.2)    Time: 2426-8341 PT Time Calculation (min) (ACUTE ONLY): 30 min   Charges:   PT Evaluation $PT Eval Low Complexity: 1 Low PT Treatments $Therapeutic Activity: 8-22 mins        Kreg Shropshire, DPT 08/25/2017, 10:51 AM

## 2017-08-25 NOTE — Progress Notes (Signed)
ANTICOAGULATION CONSULT NOTE - Initial Consult  Pharmacy Consult for heparin drip Indication: chest pain/ACS  Allergies  Allergen Reactions  . Codeine Itching    Patient Measurements: Height: 5\' 2"  (157.5 cm) Weight: 259 lb 12.8 oz (117.8 kg) IBW/kg (Calculated) : 50.1 Heparin Dosing Weight: 77 kg  Vital Signs: Temp: 98 F (36.7 C) (08/22 0329) Temp Source: Oral (08/22 0329) BP: 136/59 (08/22 0329) Pulse Rate: 65 (08/22 0329)  Labs: Recent Labs    08/22/17 2231 08/22/17 2232 08/23/17 0251  08/23/17 1117 08/23/17 1524 08/23/17 2021 08/24/17 0436 08/25/17 0512  HGB 9.1*  --  8.8*  --   --   --   --   --  9.4*  HCT 27.4*  --  26.8*  --   --   --   --   --  28.2*  PLT 187  --  181  --   --   --   --   --  220  APTT  --  33  --   --   --   --   --   --   --   LABPROT  --  14.1  --   --   --   --   --   --   --   INR  --  1.10  --   --   --   --   --   --   --   HEPARINUNFRC  --   --   --    < >  --   --  0.31 0.45 <0.10*  CREATININE 1.18*  --  1.32*  --   --   --   --   --  0.98  TROPONINI 1.29*  --  1.24*  --  1.24* 1.23* 1.22*  --   --    < > = values in this interval not displayed.    Estimated Creatinine Clearance: 63.2 mL/min (by C-G formula based on SCr of 0.98 mg/dL).   Medical History: Past Medical History:  Diagnosis Date  . Anxiety   . Coronary artery disease   . Depression   . Diabetes mellitus    insulin dependent  . Edema of lower extremity   . Esophageal stricture 2008   EGD  . Fatigue   . GERD (gastroesophageal reflux disease)   . Heart murmur   . Hiatal hernia 2008   EGD  . Hyperlipidemia   . Hypertension   . Knee pain   . Morbid obesity ([place])   . Nonproductive cough    chronic  . Orthopnea   . Seizure disorder ([place])   . SOB (shortness of breath)   . Syncope and collapse   . Tremor     Medications:  No anticoagulation in PTA meds  Assessment: Trop 1.29   4000 unit bolus and initial rate of 900 units/hr. First heparin level  8 hours after start of infusion.  08/23/17 11:33 HL subtherapeutic x 1. Confirmed with Beth RN no interruption to infusion. 2300 units IV x 1 bolus and increase rate to 1150 units/hr. Recheck HL in 8 hours.  Goal of Therapy:  Heparin level 0.3-0.7 units/ml Monitor platelets by anticoagulation protocol: Yes   Plan:  08/20:  HL @ 20:21 = 0.31 Will continue pt on current rate and recheck HL on 8/21 with AM labs.   8/21 AM heparin level 0.45. Continue current regimen. Recheck heparin level and CBC with tomorrow AM labs.  8/22 AM heparin level <0.1.  Drip d/c.   Shanda Cadotte S, Pharm.D Clinical Pharmacist 08/25/2017,6:49 AM

## 2017-08-25 NOTE — Discharge Summary (Addendum)
[facility] NAME: [name]    MR#:  [id number]  DATE OF BIRTH:  Nov 25, 1945  DATE OF ADMISSION:  08/22/2017 ADMITTING PHYSICIAN: Amelia Jo, MD  DATE OF DISCHARGE: 08/25/2017   PRIMARY CARE PHYSICIAN: Briscoe Deutscher, DO    ADMISSION DIAGNOSIS:  NSTEMI (non-ST elevated myocardial infarction) ([place]) [I21.4]  DISCHARGE DIAGNOSIS:  Active Problems:   NSTEMI (non-ST elevated myocardial infarction) ([place])  SECONDARY DIAGNOSIS:   Past Medical History:  Diagnosis Date  . Anxiety   . Coronary artery disease   . Depression   . Diabetes mellitus    insulin dependent  . Edema of lower extremity   . Esophageal stricture 2008   EGD  . Fatigue   . GERD (gastroesophageal reflux disease)   . Heart murmur   . Hiatal hernia 2008   EGD  . Hyperlipidemia   . Hypertension   . Knee pain   . Morbid obesity ([place])   . Nonproductive cough    chronic  . Orthopnea   . Seizure disorder ([place])   . SOB (shortness of breath)   . Syncope and collapse   . Tremor     HOSPITAL COURSE:   1.Non-ST elevation MI.Troponin level is elevated at 1.29.  on aspirin and heparin drip.Cardiology is consulted for further evaluation and treatment.  she had multiple cardiac issues in past and stents.  Suggest no cath- will stop heparine after 48 hrs.  Cont baseline cardiac meds and follow with her primary cardiologist in 1-2 weeks.  2.CKD 3.Creatinine is stable, at 1.18,which seems to be the baseline for this patient.Continue to monitor kidney function closely and avoid nephrotoxic medications.  3.Diabetes type 2.Will monitor blood sugars before meals and at bedtime. Will use insulin treatment during the hospital stay.  4.Ch. Diastolic CHF,currently clinically compensated.Continue medical treatment.  5.Hypertension,stable,will restart home medications.  6.Chronic normocytic anemia,mostly anemia of chronic  diseases.Hemoglobin level is stable at 9.1.No active bleeding.Continue to monitor hemoglobin level closely.  Generalized weakness, patient could not get out of the bed with physical therapy without help.  She would need to go to rehab.  Social worker to help with this arrangement.  Pt refused to go to rehab, her daughter will take her home. Pt have CHF and have generalized weakness- so she is not able to get up and walk. A walker will not resolve this issue. A wheelchair will allow safe transportation and activities of daily living. Daughter will be with her whole day to help. Pt have CHF and having a 45 degree angle at night helps her to improve CHF symptoms. Hospital bed will help with this.  DISCHARGE CONDITIONS:   Stable.  CONSULTS OBTAINED:  Treatment Team:  Yolonda Kida, MD  DRUG ALLERGIES:   Allergies  Allergen Reactions  . Codeine Itching    DISCHARGE MEDICATIONS:   Allergies as of 08/25/2017      Reactions   Codeine Itching      Medication List    TAKE these medications   acetaminophen 325 MG tablet Commonly known as:  TYLENOL Take 2 tablets (650 mg total) by mouth every 4 (four) hours as needed for headache or mild pain.   albuterol (2.5 MG/3ML) 0.083% nebulizer solution Commonly known as:  PROVENTIL Take 3 mLs (2.5 mg total) by nebulization every 4 (four) hours as needed for wheezing or shortness of breath.   ALPRAZolam 0.5 MG tablet Commonly known as:  XANAX Take 1 tablet (0.5 mg  total) by mouth 2 (two) times daily.   ALPRAZolam 0.25 MG tablet Commonly known as:  XANAX Take 1 tablet (0.25 mg total) by mouth at bedtime as needed for anxiety.   aspirin 325 MG EC tablet Take 1 tablet (325 mg total) by mouth daily.   clopidogrel 75 MG tablet Commonly known as:  PLAVIX Take 1 tablet (75 mg total) by mouth daily.   furosemide 20 MG tablet Commonly known as:  LASIX Take 1 tablet (20 mg total) by mouth 2 (two) times daily. What changed:  when  to take this   gabapentin 300 MG capsule Commonly known as:  NEURONTIN Take 300 mg by mouth at bedtime.   glucose blood test strip 1 each by Other route daily. And lancets 1/day. Dx code E11.9.   insulin aspart 100 UNIT/ML injection Commonly known as:  novoLOG Inject 0-9 Units into the skin 3 (three) times daily with meals. Sliding scale CBG 70 - 120: 0 units CBG 121 - 150: 1 unit,  CBG 151 - 200: 2 units,  CBG 201 - 250: 3 units,  CBG 251 - 300: 5 units,  CBG 301 - 350: 7 units,  CBG 351 - 400: 9 units   CBG > 400: 9 units and notify your MD   insulin detemir 100 UNIT/ML injection Commonly known as:  LEVEMIR Inject 10 Units into the skin daily.   isosorbide mononitrate 60 MG 24 hr tablet Commonly known as:  IMDUR Take 60 mg by mouth daily.   levETIRAcetam 1000 MG tablet Commonly known as:  KEPPRA Take 1 tablet (1,000 mg total) by mouth 2 (two) times daily.   loratadine 10 MG tablet Commonly known as:  CLARITIN Take 10 mg by mouth daily as needed for allergies.   losartan 25 MG tablet Commonly known as:  COZAAR Take 1 tablet (25 mg total) by mouth daily. Start taking on:  08/26/2017   Melatonin 3 MG Tabs Take 1 tablet by mouth at bedtime.   metoprolol tartrate 50 MG tablet Commonly known as:  LOPRESSOR Take 50 mg by mouth daily.   ondansetron 4 MG tablet Commonly known as:  ZOFRAN Take 4 mg by mouth every 6 (six) hours as needed for nausea or vomiting.   oxymetazoline 0.05 % nasal spray Commonly known as:  AFRIN Place 1 spray into both nostrils 2 (two) times daily as needed (nosebleeds).   polyethylene glycol packet Commonly known as:  MIRALAX / GLYCOLAX Take 17 g by mouth daily as needed for mild constipation.   potassium chloride 10 MEQ tablet Commonly known as:  K-DUR,KLOR-CON Take 1 tablet (10 mEq total) by mouth daily. Start taking on:  08/26/2017   rizatriptan 10 MG tablet Commonly known as:  MAXALT Take 1 tablet (10 mg total) by mouth as needed for  migraine. May repeat in 2 hours if needed What changed:    when to take this  reasons to take this  additional instructions   rosuvastatin 20 MG tablet Commonly known as:  CRESTOR Take 1 tablet (20 mg total) by mouth every evening. What changed:    medication strength  how much to take  when to take this   sodium chloride 0.65 % Soln nasal spray Commonly known as:  OCEAN Place 2 sprays into both nostrils 3 (three) times daily.   traMADol 50 MG tablet Commonly known as:  ULTRAM Take 1 tablet (50 mg total) by mouth every 6 (six) hours as needed.  DISCHARGE INSTRUCTIONS:    Follow with cardiology in 1-2 weeks.  If you experience worsening of your admission symptoms, develop shortness of breath, life threatening emergency, suicidal or homicidal thoughts you must seek medical attention immediately by calling 911 or calling your MD immediately  if symptoms less severe.  You Must read complete instructions/literature along with all the possible adverse reactions/side effects for all the Medicines you take and that have been prescribed to you. Take any new Medicines after you have completely understood and accept all the possible adverse reactions/side effects.   Please note  You were cared for by a hospitalist during your hospital stay. If you have any questions about your discharge medications or the care you received while you were in the hospital after you are discharged, you can call the unit and asked to speak with the hospitalist on call if the hospitalist that took care of you is not available. Once you are discharged, your primary care physician will handle any further medical issues. Please note that NO REFILLS for any discharge medications will be authorized once you are discharged, as it is imperative that you return to your primary care physician (or establish a relationship with a primary care physician if you do not have one) for your aftercare needs so that  they can reassess your need for medications and monitor your lab values.    Today   CHIEF COMPLAINT:   Chief Complaint  Patient presents with  . Abnormal Lab  . Chest Pain    HISTORY OF PRESENT ILLNESS:  [name]  is a 72 y.o. female with a known history of coronary artery disease status post MI in the past, seizure disorder, morbid obesity, hypertension, diabetes type 2 and other comorbidities. Patient was transferred to emergency room from SNF for intermittent chest pain, described as heavy pressure, across the front of her chest, 7 out of 10 in severity, without radiation.  Her symptoms are worse with exertion and improved with sublingual nitroglycerin.  The pain started around 3 PM, but patient initially refused to come to emergency room.  She eventually agreed with transfer to the hospital when the blood test done at the facility showed elevated troponin level is 0.09. Patient is currently chest pain-free.  She denies any palpitations, no fever or chills, no shortness of breath. That is done emergency room show elevated troponin level of 1.29, hemoglobin level is 9.1, creatinine level is 1.18, glucose level is 190. EKG shows normal sinus rhythm with heart rate at 65 bpm.  There is ST depression in V2 and inversion in aVL.  Minimal ST elevation is noted in lead III only.  No other ST elevation. Chest x-ray is negative for acute cardiopulmonary abnormalities. Per records review, most recent cardiac cath was done on June 27, 2017 and was reported with stable old changes when compared to the one from 2017.  No procedure was done and patient was advised to continue medical treatment. Patient is admitted for further evaluation and treatment.  VITAL SIGNS:  Blood pressure 137/69, pulse 72, temperature 97.7 F (36.5 C), resp. rate 14, height 5\' 2"  (1.575 m), weight 117.8 kg, SpO2 99 %.  I/O:    Intake/Output Summary (Last 24 hours) at 08/25/2017 1340 Last data filed at 08/25/2017  1003 Gross per 24 hour  Intake 360 ml  Output -  Net 360 ml    PHYSICAL EXAMINATION:   GENERAL:  72 y.o.-year-old patient lying in the bed with no acute distress.  EYES:  Pupils equal, round, reactive to light and accommodation. No scleral icterus. Extraocular muscles intact.  HEENT: Head atraumatic, normocephalic. Oropharynx and nasopharynx clear.  NECK:  Supple, no jugular venous distention. No thyroid enlargement, no tenderness.  LUNGS: Normal breath sounds bilaterally, no wheezing, rales,rhonchi or crepitation. No use of accessory muscles of respiration.  CARDIOVASCULAR: S1, S2 normal. No murmurs, rubs, or gallops.  ABDOMEN: Soft, nontender, nondistended. Bowel sounds present. No organomegaly or mass.  EXTREMITIES: No pedal edema, cyanosis, or clubbing.  NEUROLOGIC: Cranial nerves II through XII are intact. Muscle strength 5/5 in all extremities. Sensation intact. Gait not checked.  PSYCHIATRIC: The patient is alert and oriented x 3.  SKIN: No obvious rash, lesion, or ulcer.   DATA REVIEW:   CBC Recent Labs  Lab 08/25/17 0512  WBC 4.7  HGB 9.4*  HCT 28.2*  PLT 220    Chemistries  Recent Labs  Lab 08/25/17 0512  NA 141  K 3.7  CL 105  CO2 31  GLUCOSE 94  BUN 24*  CREATININE 0.98  CALCIUM 8.8*    Cardiac Enzymes Recent Labs  Lab 08/23/17 2021  TROPONINI 1.22*    Microbiology Results  Results for orders placed or performed during the hospital encounter of 08/22/17  MRSA PCR Screening     Status: None   Collection Time: 08/23/17  1:08 AM  Result Value Ref Range Status   MRSA by PCR NEGATIVE NEGATIVE Final    Comment:        The GeneXpert MRSA Assay (FDA approved for NASAL specimens only), is one component of a comprehensive MRSA colonization surveillance program. It is not intended to diagnose MRSA infection nor to guide or monitor treatment for MRSA infections. Performed at [facility], [address] [place], [place] [address]      RADIOLOGY:  No results found.  EKG:   Orders placed or performed during the hospital encounter of 08/22/17  . EKG 12-Lead  . EKG 12-Lead  . ED EKG  . ED EKG  . Repeat EKG  . Repeat EKG      Management plans discussed with the patient, family and they are in agreement.  CODE STATUS:     Code Status Orders  (From admission, onward)         Start     Ordered   08/23/17 0058  Full code  Continuous     08/23/17 0057        Code Status History    Date Active Date Inactive Code Status Order ID Comments User Context   07/21/2017 1934 07/27/2017 2245 Partial Code [id number]  Guilford Shi, MD ED   07/21/2017 1838 07/21/2017 1933 Full Code [id number]  Guilford Shi, MD ED   01/24/2017 0204 01/27/2017 2031 Full Code [id number]  Vilma Prader, MD ED   09/09/2016 2215 09/12/2016 1935 Partial Code [id number]  Ivor Costa, MD ED   08/28/2014 2345 09/02/2014 1622 Full Code [id number]  Theressa Millard, MD Inpatient    Advance Directive Documentation     Most Recent Value  Type of Advance Directive  Healthcare Power of Attorney  Pre-existing out of facility DNR order (yellow form or pink MOST form)  -  "MOST" Form in Place?  -      TOTAL TIME TAKING CARE OF THIS PATIENT: 35 minutes.    Vaughan Basta M.D on 08/25/2017 at 1:40 PM  Between 7am to 6pm - Pager - [phone]  After 6pm go to [email or web address] - [place]  [facility]  Office  [phone]  CC: Primary care physician; Briscoe Deutscher, DO   Note: This dictation was prepared with Dragon dictation along with smaller phrase technology. Any transcriptional errors that result from this process are unintentional.

## 2017-08-25 NOTE — Care Management Important Message (Signed)
Copy of signed IM left with patient in room.  

## 2017-08-25 NOTE — Care Management Note (Addendum)
Case Management Note  Patient Details  Name: [name] MRN: [id number] Date of Birth: Dec 11, 1945  Subjective/Objective:  Patient to be discharged per MD order. Husband prefers patient to return back to nursing home but patient refuses. Patient has communicated with her daughter and patient is set to go home with the daughter at time of discharge and not home with the husband since he believes he cannot manage her care needs. Patient competent enough to make own decisions. [facility] O2 through [facility] and would like to use them for [facility] and DME needs. Proper documentation and orders in place for DME hospital bed and wheelchair. Referral placed with Corene Cornea from [facility] who accepts the referral for home health services. Daughter to transport.  [name] RN BSN RNCM [phone]  MD to place [facility] and face to face orders.                     Action/Plan:   Expected Discharge Date:  08/25/17               Expected Discharge Plan:  [place]  In-House Referral:     Discharge planning Services  CM Consult  Post Acute Care Choice:  Durable Medical Equipment, Home Health Choice offered to:  Patient  DME Arranged:  Wheelchair manual, Hospital bed DME Agency:  [place]:  RN, PT, Nurse's Aide [facility] Agency:  [place]  Status of Service:  Completed, signed off  If discussed at [place] of Stay Meetings, dates discussed:    Additional Comments:  [name], RN 08/25/2017, 2:59 PM

## 2017-08-25 NOTE — Progress Notes (Signed)
[facility] [place] NAME: [name]    MR#:  [id number]  DATE OF BIRTH:  19-Nov-1945  SUBJECTIVE:  CHIEF COMPLAINT:   Chief Complaint  Patient presents with  . Abnormal Lab  . Chest Pain    came with chest pain, extensive cardiac history- troponin is elevated. On heparine drip, no much pain now.  REVIEW OF SYSTEMS:  CONSTITUTIONAL: No fever, fatigue or weakness.  EYES: No blurred or double vision.  EARS, NOSE, AND THROAT: No tinnitus or ear pain.  RESPIRATORY: No cough, shortness of breath, wheezing or hemoptysis.  CARDIOVASCULAR: No chest pain, orthopnea, edema.  GASTROINTESTINAL: No nausea, vomiting, diarrhea or abdominal pain.  GENITOURINARY: No dysuria, hematuria.  ENDOCRINE: No polyuria, nocturia,  HEMATOLOGY: No anemia, easy bruising or bleeding SKIN: No rash or lesion. MUSCULOSKELETAL: No joint pain or arthritis.   NEUROLOGIC: No tingling, numbness, weakness.  PSYCHIATRY: No anxiety or depression.   ROS  DRUG ALLERGIES:   Allergies  Allergen Reactions  . Codeine Itching    VITALS:  Blood pressure 137/69, pulse 72, temperature 97.7 F (36.5 C), resp. rate 14, height 5\' 2"  (1.575 m), weight 117.8 kg, SpO2 99 %.  PHYSICAL EXAMINATION:  GENERAL:  72 y.o.-year-old patient lying in the bed with no acute distress.  EYES: Pupils equal, round, reactive to light and accommodation. No scleral icterus. Extraocular muscles intact.  HEENT: Head atraumatic, normocephalic. Oropharynx and nasopharynx clear.  NECK:  Supple, no jugular venous distention. No thyroid enlargement, no tenderness.  LUNGS: Normal breath sounds bilaterally, no wheezing, rales,rhonchi or crepitation. No use of accessory muscles of respiration.  CARDIOVASCULAR: S1, S2 normal. No murmurs, rubs, or gallops.  ABDOMEN: Soft, nontender, nondistended. Bowel sounds present. No organomegaly or mass.  EXTREMITIES: No pedal edema, cyanosis, or clubbing.  NEUROLOGIC:  Cranial nerves II through XII are intact. Muscle strength 5/5 in all extremities. Sensation intact. Gait not checked.  PSYCHIATRIC: The patient is alert and oriented x 3.  SKIN: No obvious rash, lesion, or ulcer.   Physical Exam LABORATORY PANEL:   CBC Recent Labs  Lab 08/25/17 0512  WBC 4.7  HGB 9.4*  HCT 28.2*  PLT 220   ------------------------------------------------------------------------------------------------------------------  Chemistries  Recent Labs  Lab 08/25/17 0512  NA 141  K 3.7  CL 105  CO2 31  GLUCOSE 94  BUN 24*  CREATININE 0.98  CALCIUM 8.8*   ------------------------------------------------------------------------------------------------------------------  Cardiac Enzymes Recent Labs  Lab 08/23/17 1524 08/23/17 2021  TROPONINI 1.23* 1.22*   ------------------------------------------------------------------------------------------------------------------  RADIOLOGY:  No results found.  ASSESSMENT AND PLAN:   Active Problems:   NSTEMI (non-ST elevated myocardial infarction) ([place])   1.  Non-ST elevation MI.  Troponin level is elevated at 1.29.    on aspirin and heparin drip.  Cardiology is consulted for further evaluation and treatment.  she had multiple cardiac issues in past and stents.  Suggest no cath- will stop heparine after 48 hrs.  2.  CKD 3.  Creatinine is stable, at 1.18, which seems to be the baseline for this patient.  Continue to monitor kidney function closely and avoid nephrotoxic medications.  3.  Diabetes type 2.  Will monitor blood sugars before meals and at bedtime.  Will use insulin treatment during the hospital stay.  4.  Ch. Diastolic CHF, currently clinically compensated.  Continue medical treatment.  5.  Hypertension, stable, will restart home medications.  6.  Chronic normocytic anemia, mostly anemia of chronic diseases.  Hemoglobin level is  stable at 9.1.  No active bleeding.  Continue to monitor  hemoglobin level closely.    All the records are reviewed and case discussed with Care Management/Social Workerr. Management plans discussed with the patient, family and they are in agreement.  CODE STATUS: Full.  TOTAL TIME TAKING CARE OF THIS PATIENT: 35 minutes.   Husband is in room during my visit.  POSSIBLE D/C IN 1-2 DAYS, DEPENDING ON CLINICAL CONDITION.   Vaughan Basta M.D on 08/25/2017   Between 7am to 6pm - Pager - [phone]  After 6pm go to [email or web address] - password [email or web address] [place] [facility]  Office  [phone]  CC: Primary care physician; Briscoe Deutscher, DO  Note: This dictation was prepared with Dragon dictation along with smaller phrase technology. Any transcriptional errors that result from this process are unintentional.

## 2017-08-25 NOTE — Progress Notes (Signed)
[facility]  Address discharging to:  [address] [place], [place] [address]  If patient discharges after hours, please call [phone].   [name] 08/25/2017, 2:50 PM

## 2017-08-25 NOTE — Progress Notes (Signed)
Patient discharged to home with daughter.  Daughter is verbally angry with [name] that she refused to go to the nursing home.  Patient was much more able to stand, transfer and take steps than she was when PT worked with her.

## 2017-08-25 NOTE — Progress Notes (Signed)
Patient's husband has called twice to confirm that we know that he has paid out of pocket to hold her bed at [facility].  He says she is willing to go back there as long as she goes to the same room.  Patient tells me that she refuses to be discharged to anywhere other than home.  She has been on the phone with her daughter requesting to go there.  The husband's message has been communicated to both Case management and Social Work, along with his request that they call him.

## 2017-08-25 NOTE — Clinical Social Work Note (Addendum)
Patient is medically ready for discharge today. CSW spoke with patient and she is refusing to go to SNF. She states that she will not go back to the facility and wants to go live with her daughter. CSW spoke with patient's husband Kristyne Woodring [phone] and he states that patient can not go home. He would like patient to go back to SNF. CSW explained that we can not force patient to go. Patient states that she will not go. CSW explained to patient and husband that she has a discharge order for today and will have to discharge somewhere today. CSW spoke with patient's daughter Tessie Fass [phone] regarding above. Daughter states that she will take patient to live with her but needs a hospital bed and wheelchair in order to care for her. CSW notified RN CM of above and change in discharge disposition. CSW also notified MD of above and changes. CSW notified [facility] of change in disposition. Patient will now discharge to her daughter's home in [place] with [facility]. CSW notified patient and her husband of above as well. CSW signing off. Please re consult if further needs arise.   [place], [place]

## 2017-08-25 NOTE — Care Management (Signed)
Patient suffers from CHF and has trouble breathing at night when elevated less than 30-45 degrees. Bed wedges do not provide enough elevation to resolve breathing issues. Shortness of breath, pain and weakness cause patient to require immediate changes in body position which cannot be achieved with a normal bed.  Ines Bloomer RN BSN RNCM [phone]

## 2017-08-26 ENCOUNTER — Encounter: Payer: Self-pay | Admitting: Family Medicine

## 2017-08-26 ENCOUNTER — Ambulatory Visit (INDEPENDENT_AMBULATORY_CARE_PROVIDER_SITE_OTHER): Payer: Medicare Other | Admitting: Family Medicine

## 2017-08-26 VITALS — BP 142/74 | HR 74 | Temp 98.3°F

## 2017-08-26 DIAGNOSIS — I214 Non-ST elevation (NSTEMI) myocardial infarction: Secondary | ICD-10-CM

## 2017-08-26 DIAGNOSIS — Z789 Other specified health status: Secondary | ICD-10-CM

## 2017-08-26 DIAGNOSIS — I152 Hypertension secondary to endocrine disorders: Secondary | ICD-10-CM

## 2017-08-26 DIAGNOSIS — G40909 Epilepsy, unspecified, not intractable, without status epilepticus: Secondary | ICD-10-CM

## 2017-08-26 DIAGNOSIS — Z8673 Personal history of transient ischemic attack (TIA), and cerebral infarction without residual deficits: Secondary | ICD-10-CM

## 2017-08-26 DIAGNOSIS — R531 Weakness: Secondary | ICD-10-CM | POA: Diagnosis not present

## 2017-08-26 DIAGNOSIS — I10 Essential (primary) hypertension: Secondary | ICD-10-CM

## 2017-08-26 DIAGNOSIS — Z7409 Other reduced mobility: Secondary | ICD-10-CM

## 2017-08-26 DIAGNOSIS — F419 Anxiety disorder, unspecified: Secondary | ICD-10-CM

## 2017-08-26 DIAGNOSIS — I4892 Unspecified atrial flutter: Secondary | ICD-10-CM | POA: Diagnosis not present

## 2017-08-26 DIAGNOSIS — F339 Major depressive disorder, recurrent, unspecified: Secondary | ICD-10-CM | POA: Diagnosis not present

## 2017-08-26 DIAGNOSIS — E1142 Type 2 diabetes mellitus with diabetic polyneuropathy: Secondary | ICD-10-CM | POA: Diagnosis not present

## 2017-08-26 DIAGNOSIS — G894 Chronic pain syndrome: Secondary | ICD-10-CM

## 2017-08-26 DIAGNOSIS — I5033 Acute on chronic diastolic (congestive) heart failure: Secondary | ICD-10-CM | POA: Diagnosis not present

## 2017-08-26 DIAGNOSIS — E1159 Type 2 diabetes mellitus with other circulatory complications: Secondary | ICD-10-CM

## 2017-08-26 DIAGNOSIS — E1169 Type 2 diabetes mellitus with other specified complication: Secondary | ICD-10-CM | POA: Diagnosis not present

## 2017-08-26 DIAGNOSIS — G43009 Migraine without aura, not intractable, without status migrainosus: Secondary | ICD-10-CM

## 2017-08-26 DIAGNOSIS — Z09 Encounter for follow-up examination after completed treatment for conditions other than malignant neoplasm: Secondary | ICD-10-CM

## 2017-08-26 DIAGNOSIS — E669 Obesity, unspecified: Secondary | ICD-10-CM

## 2017-08-26 DIAGNOSIS — J302 Other seasonal allergic rhinitis: Secondary | ICD-10-CM

## 2017-08-26 DIAGNOSIS — E785 Hyperlipidemia, unspecified: Secondary | ICD-10-CM

## 2017-08-26 MED ORDER — ASPIRIN 325 MG PO TBEC: 325.0000 mg | DELAYED_RELEASE_TABLET | Freq: Every day | ORAL | 0 refills | Status: DC

## 2017-08-26 MED ORDER — INSULIN DETEMIR 100 UNIT/ML ~~LOC~~ SOLN: 10.0000 [IU] | Freq: Every day | SUBCUTANEOUS | 0 refills | Status: DC

## 2017-08-26 MED ORDER — ONDANSETRON HCL 4 MG PO TABS: 4.0000 mg | ORAL_TABLET | Freq: Four times a day (QID) | ORAL | 0 refills | Status: AC | PRN

## 2017-08-26 MED ORDER — POTASSIUM CHLORIDE CRYS ER 10 MEQ PO TBCR: 10.0000 meq | EXTENDED_RELEASE_TABLET | Freq: Every day | ORAL | 0 refills | Status: DC

## 2017-08-26 MED ORDER — SALINE SPRAY 0.65 % NA SOLN: 2.0000 | Freq: Three times a day (TID) | NASAL | 0 refills | Status: AC

## 2017-08-26 MED ORDER — FUROSEMIDE 20 MG PO TABS: 20.0000 mg | ORAL_TABLET | Freq: Two times a day (BID) | ORAL | 0 refills | Status: DC

## 2017-08-26 MED ORDER — LOSARTAN POTASSIUM 25 MG PO TABS: 25.0000 mg | ORAL_TABLET | Freq: Every day | ORAL | 0 refills | Status: DC

## 2017-08-26 MED ORDER — GLUCOSE BLOOD VI STRP: 1.0000 | ORAL_STRIP | Freq: Every day | 3 refills | Status: AC

## 2017-08-26 MED ORDER — INSULIN ASPART 100 UNIT/ML ~~LOC~~ SOLN: 0.0000 [IU] | Freq: Three times a day (TID) | SUBCUTANEOUS | 11 refills | Status: DC

## 2017-08-26 MED ORDER — TRAMADOL HCL 50 MG PO TABS: 50.0000 mg | ORAL_TABLET | Freq: Four times a day (QID) | ORAL | 0 refills | Status: DC | PRN

## 2017-08-26 MED ORDER — CLOPIDOGREL BISULFATE 75 MG PO TABS: 75.0000 mg | ORAL_TABLET | Freq: Every day | ORAL | Status: DC

## 2017-08-26 MED ORDER — LORATADINE 10 MG PO TABS: 10.0000 mg | ORAL_TABLET | Freq: Every day | ORAL | 0 refills | Status: DC | PRN

## 2017-08-26 MED ORDER — GABAPENTIN 300 MG PO CAPS: 300.0000 mg | ORAL_CAPSULE | Freq: Every day | ORAL | 0 refills | Status: DC

## 2017-08-26 MED ORDER — ALPRAZOLAM 0.5 MG PO TABS: 0.5000 mg | ORAL_TABLET | Freq: Two times a day (BID) | ORAL | 0 refills | Status: DC

## 2017-08-26 MED ORDER — OXYMETAZOLINE HCL 0.05 % NA SOLN: 1.0000 | Freq: Two times a day (BID) | NASAL | 0 refills | Status: AC | PRN

## 2017-08-26 MED ORDER — MELATONIN 3 MG PO TABS: 1.0000 | ORAL_TABLET | Freq: Every day | ORAL | 0 refills | Status: AC

## 2017-08-26 MED ORDER — ROSUVASTATIN CALCIUM 20 MG PO TABS: 20.0000 mg | ORAL_TABLET | Freq: Every evening | ORAL | 0 refills | Status: DC

## 2017-08-26 MED ORDER — ALPRAZOLAM 0.25 MG PO TABS: 0.2500 mg | ORAL_TABLET | Freq: Every evening | ORAL | 0 refills | Status: DC | PRN

## 2017-08-26 MED ORDER — LEVETIRACETAM 1000 MG PO TABS: 1000.0000 mg | ORAL_TABLET | Freq: Two times a day (BID) | ORAL | 0 refills | Status: DC

## 2017-08-26 MED ORDER — ISOSORBIDE MONONITRATE ER 60 MG PO TB24: 60.0000 mg | ORAL_TABLET | Freq: Every day | ORAL | 0 refills | Status: DC

## 2017-08-26 MED ORDER — RIZATRIPTAN BENZOATE 10 MG PO TABS: 10.0000 mg | ORAL_TABLET | Freq: Every day | ORAL | 0 refills | Status: DC | PRN

## 2017-08-26 MED ORDER — SERTRALINE HCL 50 MG PO TABS: 50.0000 mg | ORAL_TABLET | Freq: Every day | ORAL | 3 refills | Status: DC

## 2017-08-26 MED ORDER — POLYETHYLENE GLYCOL 3350 17 G PO PACK: 17.0000 g | PACK | Freq: Every day | ORAL | 0 refills | Status: AC | PRN

## 2017-08-26 MED ORDER — METOPROLOL TARTRATE 50 MG PO TABS: 50.0000 mg | ORAL_TABLET | Freq: Every day | ORAL | 0 refills | Status: DC

## 2017-08-26 MED ORDER — ALBUTEROL SULFATE (2.5 MG/3ML) 0.083% IN NEBU: 2.5000 mg | INHALATION_SOLUTION | RESPIRATORY_TRACT | 12 refills | Status: DC | PRN

## 2017-08-26 NOTE — Progress Notes (Signed)
[name] is a 72 y.o. female is here for follow up.  History of Present Illness:   [name], CMA acting as scribe for Dr. Briscoe Deutscher.   HPI: Patient in office for hospital follow up. She is aware that she does not need to be getting up out of wheel chair at all. She did not want to go to rehab that she had been to in the past. She did not feel safe when she was there. She admitted last time she had not been in office for a long time. She states that she did not want to come back because Dr. Juleen China would not fill all of her prescriptions. We advised her that we do not want to refill medications that are given by specialist to ensure that she follows up with those providers. She understood and will continue to keep appointments.   Patient has not had medications today. She did not have any prescriptions to get refills due to being in rehab. She was informed that we will call in one month only to give her time to get with all specialist. She is aware that she will need to follow up with our office. She is in agreement to get home health as well as palliative care started. She is at home with daughter. Her husband did not feel like he could take care of her effectively.    1.Non-ST elevation MI.Troponin level is elevated at 1.29.cardiology is consulted for further evaluation and treatment because she has a significant cardiac history. No cath suggested. Continue current medications.   Treatment Team:  Yolonda Kida, MD  2.CKD 3.Creatinine is stable, at 1.18,which seems to be the baseline for this patient.  3.Diabetes type 2.   Lab Results  Component Value Date   HGBA1C 7.9 (H) 07/22/2017   4.Ch. Diastolic CHF,currently clinically compensated.  5.Hypertension.   BP Readings from Last 3 Encounters:  08/26/17 (!) 142/74  08/25/17 137/69  07/27/17 (!) 143/73   6.Chronic normocytic anemia,mostly anemia of chronic disease.Hemoglobin level is  stable at 9.1.  Generalized weakness. Patient could not get out of the bed with physical therapy without help. Refused rehab. Home with daughter and grandson.  Health Maintenance Due  Topic Date Due  . OPHTHALMOLOGY EXAM  04/20/1955  . TETANUS/TDAP  04/19/1964  . DEXA SCAN  04/20/2010  . PNA vac Low Risk Adult (1 of 2 - PCV13) 04/20/2010  . FOOT EXAM  07/15/2017  . INFLUENZA VACCINE  08/04/2017   No flowsheet data found. PMHx, SurgHx, SocialHx, FamHx, Medications, and Allergies were reviewed in the Visit Navigator and updated as appropriate.   Patient Active Problem List   Diagnosis Date Noted  . Impaired mobility and ADLs 08/29/2017  . NSTEMI (non-ST elevated myocardial infarction) ([place]) 08/22/2017  . Cerebral thrombosis with cerebral infarction 07/27/2017  . Anemia of chronic disease 07/22/2017  . LV dysfunction 10/06/2016  . 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 Acute on chronic diastolic CHF (congestive heart failure) ([place]) 09/09/2016  . Migraine 08/20/2016  . Morbid obesity ([place]) 08/20/2016  . GAD (generalized anxiety disorder) 10/02/2015  . DOE (dyspnea on exertion), on O2 10/02/2015  . Hyperlipidemia associated with type 2 diabetes mellitus ([place]), on Crestor 10/02/2015  . Nonrheumatic aortic valve stenosis 10/02/2015  . Old MI (myocardial infarction) 10/02/2015  . Primary insomnia 11/03/2014  . Weakness generalized 08/28/2014  . Seizure disorder ([place]) 08/28/2014  . Type 1 diabetes mellitus with  kidney complication ([place]) 61/95/0932  . Coronary artery disease of native artery of native heart with stable angina pectoris ([place]) 06/02/2010  . Depression, recurrent ([place]), on Zoloft 05/20/2007  . Hypertension associated with diabetes ([place]) 05/20/2007  . Diaphragmatic hernia 05/20/2007  . Diverticulosis of colon 05/20/2007  . IBS 05/20/2007  . Fatty liver 05/20/2007  . Hypothyroidism 05/20/2007  . Hx of  transient ischemic attack (TIA) 05/20/2007   Social History   Tobacco Use  . Smoking status: Former Smoker    Last attempt to quit: 06/02/1970    Years since quitting: 47.2  . Smokeless tobacco: Never Used  Substance Use Topics  . Alcohol use: No  . Drug use: No   Current Medications and Allergies:   .  acetaminophen (TYLENOL) 325 MG tablet, Take 2 tablets (650 mg total) by mouth every 4 (four) hours as needed for headache or mild pain., Disp: 30 tablet, Rfl: 0 .  albuterol (PROVENTIL) (2.5 MG/3ML) 0.083% nebulizer solution, Take 3 mLs (2.5 mg total) by nebulization every 4 (four) hours as needed for wheezing or shortness of breath., Disp: 75 mL, Rfl: 12 .  ALPRAZolam (XANAX) 0.25 MG tablet, Take 1 tablet (0.25 mg total) by mouth at bedtime as needed for anxiety., Disp: 10 tablet, Rfl: 0 .  ALPRAZolam (XANAX) 0.5 MG tablet, Take 1 tablet (0.5 mg total) by mouth 2 (two) times daily., Disp: 20 tablet, Rfl: 0 .  aspirin EC 325 MG EC tablet, Take 1 tablet (325 mg total) by mouth daily., Disp: 30 tablet, Rfl: 0 .  clopidogrel (PLAVIX) 75 MG tablet, Take 1 tablet (75 mg total) by mouth daily., Disp: , Rfl:  .  furosemide (LASIX) 20 MG tablet, Take 1 tablet (20 mg total) by mouth 2 (two) times daily., Disp: 30 tablet, Rfl: 0 .  gabapentin (NEURONTIN) 300 MG capsule, Take 300 mg by mouth at bedtime. , Disp: , Rfl:  .  glucose blood (ONETOUCH VERIO) test strip, 1 each by Other route daily. And lancets 1/day. Dx code E11.9. (Patient not taking: Reported on 07/21/2017), Disp: 100 each, Rfl: 3 .  insulin aspart (NOVOLOG) 100 UNIT/ML injection, Inject 0-9 Units into the skin 3 (three) times daily with meals. Sliding scale CBG 70 - 120: 0 units CBG 121 - 150: 1 unit,  CBG 151 - 200: 2 units,  CBG 201 - 250: 3 units,  CBG 251 - 300: 5 units,  CBG 301 - 350: 7 units,  CBG 351 - 400: 9 units   CBG > 400: 9 units and notify your MD, Disp: 10 mL, Rfl: 11 .  insulin detemir (LEVEMIR) 100 UNIT/ML injection, Inject  10 Units into the skin daily., Disp: , Rfl:  .  isosorbide mononitrate (IMDUR) 60 MG 24 hr tablet, Take 60 mg by mouth daily. , Disp: , Rfl:  .  levETIRAcetam (KEPPRA) 1000 MG tablet, Take 1 tablet (1,000 mg total) by mouth 2 (two) times daily., Disp: 60 tablet, Rfl: 0 .  loratadine (CLARITIN) 10 MG tablet, Take 10 mg by mouth daily as needed for allergies., Disp: , Rfl:  .  losartan (COZAAR) 25 MG tablet, Take 1 tablet (25 mg total) by mouth daily., Disp: 30 tablet, Rfl: 0 .  Melatonin 3 MG TABS, Take 1 tablet by mouth at bedtime., Disp: , Rfl:  .  metoprolol (LOPRESSOR) 50 MG tablet, Take 50 mg by mouth daily. , Disp: , Rfl: 0 .  ondansetron (ZOFRAN) 4 MG tablet, Take 4 mg by mouth every 6 (  six) hours as needed for nausea or vomiting., Disp: , Rfl:  .  oxymetazoline (AFRIN) 0.05 % nasal spray, Place 1 spray into both nostrils 2 (two) times daily as needed (nosebleeds)., Disp: , Rfl:  .  polyethylene glycol (MIRALAX / GLYCOLAX) packet, Take 17 g by mouth daily as needed for mild constipation., Disp: 14 each, Rfl: 0 .  potassium chloride (K-DUR,KLOR-CON) 10 MEQ tablet, Take 1 tablet (10 mEq total) by mouth daily., Disp: 30 tablet, Rfl: 0 .  rizatriptan (MAXALT) 10 MG tablet, Take 1 tablet (10 mg total) by mouth as needed for migraine. May repeat in 2 hours if needed (Patient taking differently: Take 10 mg by mouth daily as needed (for chronic pain). ), Disp: 10 tablet, Rfl: 5 .  rosuvastatin (CRESTOR) 20 MG tablet, Take 1 tablet (20 mg total) by mouth every evening., Disp: 30 tablet, Rfl: 0 .  sodium chloride (OCEAN) 0.65 % SOLN nasal spray, Place 2 sprays into both nostrils 3 (three) times daily., Disp: , Rfl:  .  traMADol (ULTRAM) 50 MG tablet, Take 1 tablet (50 mg total) by mouth every 6 (six) hours as needed., Disp: 20 tablet, Rfl: 0   Allergies  Allergen Reactions  . Codeine Itching   Review of Systems   Pertinent items are noted in the HPI. Otherwise, ROS is negative.  Vitals:    Vitals:   08/26/17 1130  BP: (!) 142/74  Pulse: 74  Temp: 98.3 F (36.8 C)  TempSrc: Oral  SpO2: 94%     There is no height or weight on file to calculate BMI.  Physical Exam:   Physical Exam  Constitutional: She is oriented to person, place, and time. She appears well-developed and well-nourished.  Wearing nasal canula.   HENT:  Head: Normocephalic and atraumatic.  Right Ear: External ear normal.  Left Ear: External ear normal.  Nose: Nose normal.  Mouth/Throat: Oropharynx is clear and moist.  Eyes: Pupils are equal, round, and reactive to light. Conjunctivae and EOM are normal.  Neck: Normal range of motion. Neck supple.  Cardiovascular: Normal rate, regular rhythm and intact distal pulses.  Pulmonary/Chest: Effort normal. She has decreased breath sounds.  Abdominal: Soft. Bowel sounds are normal.  Neurological: She is alert and oriented to person, place, and time.  Skin: Skin is warm. Capillary refill takes less than 2 seconds.  Psychiatric: She has a normal mood and affect. Her behavior is normal.  Nursing note and vitals reviewed.  Assessment and Plan:   Chanci was seen today for hospitalization follow-up.  Diagnoses and all orders for this visit:  Hospital discharge follow-up Medication reconciliation:  [x]   Medication list updated [x]   New medication list given to patient/family/caregiver  Referrals: []   None needed []   Referrals made to: [place] resources identified for patient/family:  []   None needed  [x]   Home health agency []   Assisted living  []   Hospice  []   Support group  []   Education program  Durable medical equipment ordered:  []   None needed  []   DME ordered: [facility], HOME SAFETY EQUIPTMENT  Additional communication delivered or planned:  [x]   Family/Caregiver:  []   Specialists:  []   Other:  Patient education: Topics discussed: SAFETY PLAN FOR HOME, CODE STATUS - PATIENT WANTS TO REMAIN  FULL CODE, ALL MEDS REFILLED X 1 MONTH BUT PATIENT RESPONSIBILITY TO CALL SPECIALISTS TO GET PLUGGED IN AGAIN Handouts given: SEE AVS  Acute on chronic diastolic CHF (congestive heart  failure) ([place]) -     Ambulatory referral to Home Health -     furosemide (LASIX) 20 MG tablet; Take 1 tablet (20 mg total) by mouth 2 (two) times daily. -     isosorbide mononitrate (IMDUR) 60 MG 24 hr tablet; Take 1 tablet (60 mg total) by mouth daily. -     potassium chloride (K-DUR,KLOR-CON) 10 MEQ tablet; Take 1 tablet (10 mEq total) by mouth daily.  NSTEMI (non-ST elevated myocardial infarction) (HCC) -     aspirin 325 MG EC tablet; Take 1 tablet (325 mg total) by mouth daily. -     clopidogrel (PLAVIX) 75 MG tablet; Take 1 tablet (75 mg total) by mouth daily.  Anxiety Comments: Database reviewed.  Refill provided today. Orders: -     sertraline (ZOLOFT) 50 MG tablet; Take 1 tablet (50 mg total) by mouth at bedtime. -     ALPRAZolam (XANAX) 0.25 MG tablet; Take 1 tablet (0.25 mg total) by mouth at bedtime as needed for anxiety. -     ALPRAZolam (XANAX) 0.5 MG tablet; Take 1 tablet (0.5 mg total) by mouth 2 (two) times daily.  Atrial flutter, unspecified type ([place])  Hx of transient ischemic attack (TIA)  Hyperlipidemia associated with type 2 diabetes mellitus ([facility]) -     rosuvastatin (CRESTOR) 20 MG tablet; Take 1 tablet (20 mg total) by mouth every evening.  Depression, recurrent ([place]) -     sertraline (ZOLOFT) 50 MG tablet; Take 1 tablet (50 mg total) by mouth at bedtime.  Diabetes mellitus type 2 in obese (H[facility]) -     glucose blood (ONETOUCH VERIO) test strip; 1 each by Other route daily. And lancets 1/day. Dx code E11.9. -     insulin aspart (NOVOLOG) 100 UNIT/ML injection; Inject 0-9 Units into the skin 3 (three) times daily with meals. Sliding scale CBG 70 - 120: 0 units CBG 121 - 150: 1 unit,  CBG 151 - 200: 2 units,  CBG 201 - 250: 3 units,  CBG 251 - 300: 5 units,  CBG 301 - 350: 7 units,   CBG 351 - 400: 9 units   CBG > 400: 9 units and notify your MD -     insulin detemir (LEVEMIR) 100 UNIT/ML injection; Inject 0.1 mLs (10 Units total) into the skin daily.  Weakness generalized -     Ambulatory referral to Home Health  Impaired mobility and ADLs -     Ambulatory referral to Home Health  Diabetic peripheral neuropathy (HCC) -     gabapentin (NEURONTIN) 300 MG capsule; Take 1 capsule (300 mg total) by mouth at bedtime.  Seizure disorder ([facility]) -     levETIRAcetam (KEPPRA) 1000 MG tablet; Take 1 tablet (1,000 mg total) by mouth 2 (two) times daily.  Hypertension associated with diabetes ([place]) -     losartan (COZAAR) 25 MG tablet; Take 1 tablet (25 mg total) by mouth daily. -     metoprolol tartrate (LOPRESSOR) 50 MG tablet; Take 1 tablet (50 mg total) by mouth daily.  Migraine without aura and without status migrainosus, not intractable -     ondansetron (ZOFRAN) 4 MG tablet; Take 1 tablet (4 mg total) by mouth every 6 (six) hours as needed for nausea or vomiting. -     rizatriptan (MAXALT) 10 MG tablet; Take 1 tablet (10 mg total) by mouth daily as needed (for chronic pain).  Seasonal allergies -     loratadine (CLARITIN) 10 MG tablet; Take 1  tablet (10 mg total) by mouth daily as needed for allergies. -     oxymetazoline (AFRIN) 0.05 % nasal spray; Place 1 spray into both nostrils 2 (two) times daily as needed (nosebleeds). -     sodium chloride (OCEAN) 0.65 % SOLN nasal spray; Place 2 sprays into both nostrils 3 (three) times daily.  Chronic pain syndrome -     traMADol (ULTRAM) 50 MG tablet; Take 1 tablet (50 mg total) by mouth every 6 (six) hours as needed.  Other orders -     albuterol (PROVENTIL) (2.5 MG/3ML) 0.083% nebulizer solution; Take 3 mLs (2.5 mg total) by nebulization every 4 (four) hours as needed for wheezing or shortness of breath. -     Melatonin 3 MG TABS; Take 1 tablet (3 mg total) by mouth at bedtime. -     polyethylene glycol (MIRALAX / GLYCOLAX)  packet; Take 17 g by mouth daily as needed for mild constipation.    . Reviewed expectations re: course of current medical issues. . Discussed self-management of symptoms. . Outlined signs and symptoms indicating need for more acute intervention. . Patient verbalized understanding and all questions were answered. Marland Kitchen Health Maintenance issues including appropriate healthy diet, exercise, and smoking avoidance were discussed with patient. . See orders for this visit as documented in the electronic medical record. . Patient received an After Visit Summary.  CMA served as Education administrator during this visit. History, Physical, and Plan performed by medical provider. The above documentation has been reviewed and is accurate and complete. Briscoe Deutscher, D.O.  Briscoe Deutscher, DO [place], [place] [facility] 08/29/2017

## 2017-08-29 ENCOUNTER — Encounter: Payer: Self-pay | Admitting: Family Medicine

## 2017-08-29 DIAGNOSIS — Z7409 Other reduced mobility: Secondary | ICD-10-CM | POA: Insufficient documentation

## 2017-08-29 DIAGNOSIS — Z789 Other specified health status: Secondary | ICD-10-CM | POA: Insufficient documentation

## 2017-08-30 ENCOUNTER — Other Ambulatory Visit: Payer: Self-pay | Admitting: Family Medicine

## 2017-08-30 ENCOUNTER — Telehealth: Payer: Self-pay | Admitting: Family Medicine

## 2017-08-30 DIAGNOSIS — K219 Gastro-esophageal reflux disease without esophagitis: Secondary | ICD-10-CM | POA: Diagnosis not present

## 2017-08-30 DIAGNOSIS — Z7902 Long term (current) use of antithrombotics/antiplatelets: Secondary | ICD-10-CM | POA: Diagnosis not present

## 2017-08-30 DIAGNOSIS — G40909 Epilepsy, unspecified, not intractable, without status epilepticus: Secondary | ICD-10-CM | POA: Diagnosis not present

## 2017-08-30 DIAGNOSIS — D631 Anemia in chronic kidney disease: Secondary | ICD-10-CM | POA: Diagnosis not present

## 2017-08-30 DIAGNOSIS — N183 Chronic kidney disease, stage 3 (moderate): Secondary | ICD-10-CM | POA: Diagnosis not present

## 2017-08-30 DIAGNOSIS — F329 Major depressive disorder, single episode, unspecified: Secondary | ICD-10-CM | POA: Diagnosis not present

## 2017-08-30 DIAGNOSIS — Z9981 Dependence on supplemental oxygen: Secondary | ICD-10-CM | POA: Diagnosis not present

## 2017-08-30 DIAGNOSIS — E1122 Type 2 diabetes mellitus with diabetic chronic kidney disease: Secondary | ICD-10-CM | POA: Diagnosis not present

## 2017-08-30 DIAGNOSIS — I2510 Atherosclerotic heart disease of native coronary artery without angina pectoris: Secondary | ICD-10-CM | POA: Diagnosis not present

## 2017-08-30 DIAGNOSIS — Z794 Long term (current) use of insulin: Secondary | ICD-10-CM | POA: Diagnosis not present

## 2017-08-30 DIAGNOSIS — I5030 Unspecified diastolic (congestive) heart failure: Secondary | ICD-10-CM | POA: Diagnosis not present

## 2017-08-30 DIAGNOSIS — I214 Non-ST elevation (NSTEMI) myocardial infarction: Secondary | ICD-10-CM | POA: Diagnosis not present

## 2017-08-30 DIAGNOSIS — Z7982 Long term (current) use of aspirin: Secondary | ICD-10-CM | POA: Diagnosis not present

## 2017-08-30 DIAGNOSIS — I130 Hypertensive heart and chronic kidney disease with heart failure and stage 1 through stage 4 chronic kidney disease, or unspecified chronic kidney disease: Secondary | ICD-10-CM | POA: Diagnosis not present

## 2017-08-30 DIAGNOSIS — F419 Anxiety disorder, unspecified: Secondary | ICD-10-CM | POA: Diagnosis not present

## 2017-08-30 MED ORDER — CLOPIDOGREL BISULFATE 75 MG PO TABS: 75.0000 mg | ORAL_TABLET | Freq: Every day | ORAL | 0 refills | Status: DC

## 2017-08-30 NOTE — Telephone Encounter (Signed)
Copied from [place] [phone]. Topic: General - Other >> Aug 30, 2017  1:59 PM Margot Ables wrote: Reason for CRM: INR today 1.1 - pt does not have Plavix RX at home, she had been prescribed in the hospital, please advise for how to proceed, please notify pt and nurse with [facility].

## 2017-08-30 NOTE — Telephone Encounter (Signed)
See note

## 2017-08-30 NOTE — Telephone Encounter (Signed)
I sent prescription to the pharmacy. It did not go through at visit.

## 2017-08-30 NOTE — Telephone Encounter (Signed)
[facility] called and spoke to [facility], [facility] who says they do not have Clopidogrel on the patient's list of medications. Per the chart it was sent on 08/26/17, no print.   Clopidogrel refill Last OV:08/26/17 PCP: [place]: [place] [address], [place] [phone] (Phone) [phone] (Fax)

## 2017-08-30 NOTE — Telephone Encounter (Signed)
Copied from [place]. Topic: Quick Communication - Rx Refill/Question >> Aug 30, 2017  1:04 PM [name] wrote: Medication: clopidogrel (PLAVIX) 75 MG tablet  Has the patient contacted their pharmacy? No. (Agent: If no, request that the patient contact the pharmacy for the refill.) (Agent: If yes, when and what did the pharmacy advise?)  Preferred Pharmacy (with phone number or street name):[address], [place], [place] [address]   [address] [phone]   Agent: Please be advised that RX refills may take up to 3 business days. We ask that you follow-up with your pharmacy.

## 2017-08-30 NOTE — Telephone Encounter (Signed)
Copied from [place] [id number]. Topic: General - Other >> Aug 30, 2017 12:57 PM [name] wrote: Reason for CRM: Nurse Glenard Haring from [facility] [phone] calling for verbal orders for skilled home health nursing  and medical social  work evaluation for 1 time for one week

## 2017-08-30 NOTE — Telephone Encounter (Signed)
Called and left detailed voice mail with information giving verbal ok.

## 2017-08-31 DIAGNOSIS — I214 Non-ST elevation (NSTEMI) myocardial infarction: Secondary | ICD-10-CM | POA: Diagnosis not present

## 2017-08-31 DIAGNOSIS — Z0279 Encounter for issue of other medical certificate: Secondary | ICD-10-CM

## 2017-08-31 DIAGNOSIS — E1122 Type 2 diabetes mellitus with diabetic chronic kidney disease: Secondary | ICD-10-CM | POA: Diagnosis not present

## 2017-08-31 DIAGNOSIS — N183 Chronic kidney disease, stage 3 (moderate): Secondary | ICD-10-CM | POA: Diagnosis not present

## 2017-08-31 DIAGNOSIS — I2510 Atherosclerotic heart disease of native coronary artery without angina pectoris: Secondary | ICD-10-CM | POA: Diagnosis not present

## 2017-08-31 DIAGNOSIS — I130 Hypertensive heart and chronic kidney disease with heart failure and stage 1 through stage 4 chronic kidney disease, or unspecified chronic kidney disease: Secondary | ICD-10-CM | POA: Diagnosis not present

## 2017-08-31 DIAGNOSIS — I5030 Unspecified diastolic (congestive) heart failure: Secondary | ICD-10-CM | POA: Diagnosis not present

## 2017-08-31 NOTE — Telephone Encounter (Signed)
Called patient mail box full and no answer.

## 2017-08-31 NOTE — Telephone Encounter (Signed)
This should have been cancelled at last visit want to make sure she is not charged for late cancellation.

## 2017-08-31 NOTE — Telephone Encounter (Signed)
Pt daughter Clarene Critchley is calling and pt does not need hospital follow up on 09-01-17. The appt has been cancelled

## 2017-08-31 NOTE — Telephone Encounter (Signed)
Patient has an appointment with me on 09/01/17 for hospital follow-up, however it appears that she had a hospital f/u with PCP on 08/26/17 -- is appointment still needed?  Inda Coke PA-C

## 2017-08-31 NOTE — Telephone Encounter (Signed)
New Message  Pts daughter needs FMLA forms filled out by Friday.

## 2017-09-01 ENCOUNTER — Inpatient Hospital Stay: Payer: Medicare Other | Admitting: Physician Assistant

## 2017-09-01 NOTE — Telephone Encounter (Signed)
Form completed called daughter and l/m to let her know. PPW placed at reception. Will need copy after employee signature is on it.

## 2017-09-06 ENCOUNTER — Telehealth: Payer: Self-pay | Admitting: Family Medicine

## 2017-09-06 DIAGNOSIS — N183 Chronic kidney disease, stage 3 (moderate): Secondary | ICD-10-CM | POA: Diagnosis not present

## 2017-09-06 DIAGNOSIS — I214 Non-ST elevation (NSTEMI) myocardial infarction: Secondary | ICD-10-CM | POA: Diagnosis not present

## 2017-09-06 DIAGNOSIS — E1122 Type 2 diabetes mellitus with diabetic chronic kidney disease: Secondary | ICD-10-CM | POA: Diagnosis not present

## 2017-09-06 DIAGNOSIS — I2510 Atherosclerotic heart disease of native coronary artery without angina pectoris: Secondary | ICD-10-CM | POA: Diagnosis not present

## 2017-09-06 DIAGNOSIS — I130 Hypertensive heart and chronic kidney disease with heart failure and stage 1 through stage 4 chronic kidney disease, or unspecified chronic kidney disease: Secondary | ICD-10-CM | POA: Diagnosis not present

## 2017-09-06 DIAGNOSIS — I5030 Unspecified diastolic (congestive) heart failure: Secondary | ICD-10-CM | POA: Diagnosis not present

## 2017-09-06 NOTE — Telephone Encounter (Signed)
OK for verbal orderes?

## 2017-09-06 NOTE — Telephone Encounter (Signed)
See note  Copied from [place] [phone]. Topic: Quick Communication - See Telephone Encounter >> Sep 06, 2017  9:16 AM Antonieta Iba C wrote: CRM for notification. See Telephone encounter for: 09/06/17.   [facility] w/ [facility] called in to request verbal orders for Tele monitoring (usually for 4-5 weeks) in the home and a dieretic kit.   CB: [phone]

## 2017-09-06 NOTE — Telephone Encounter (Signed)
Called and left voicemail for verbal orders to [place].

## 2017-09-06 NOTE — Telephone Encounter (Signed)
Okay 

## 2017-09-07 ENCOUNTER — Telehealth: Payer: Self-pay | Admitting: Family Medicine

## 2017-09-07 NOTE — Telephone Encounter (Signed)
Copied from [place] [id number]. Topic: General - Other >> Sep 07, 2017 11:31 AM [name] wrote: Reason for CRM: Deforest Hoyles with [place] requests verbal orders for [name] for 2 times a week for 4 weeks followed by 1 time a week for 4 weeks for strengthening, ambulation, transfer training, and fall prevention. Cb# [phone]

## 2017-09-07 NOTE — Telephone Encounter (Signed)
Called and gave verbal for PT to [facility] [place]

## 2017-09-08 ENCOUNTER — Telehealth: Payer: Self-pay | Admitting: Family Medicine

## 2017-09-08 ENCOUNTER — Other Ambulatory Visit: Payer: Self-pay | Admitting: *Deleted

## 2017-09-08 DIAGNOSIS — E1122 Type 2 diabetes mellitus with diabetic chronic kidney disease: Secondary | ICD-10-CM | POA: Diagnosis not present

## 2017-09-08 DIAGNOSIS — I5030 Unspecified diastolic (congestive) heart failure: Secondary | ICD-10-CM | POA: Diagnosis not present

## 2017-09-08 DIAGNOSIS — E669 Obesity, unspecified: Principal | ICD-10-CM

## 2017-09-08 DIAGNOSIS — I130 Hypertensive heart and chronic kidney disease with heart failure and stage 1 through stage 4 chronic kidney disease, or unspecified chronic kidney disease: Secondary | ICD-10-CM | POA: Diagnosis not present

## 2017-09-08 DIAGNOSIS — E1169 Type 2 diabetes mellitus with other specified complication: Secondary | ICD-10-CM

## 2017-09-08 DIAGNOSIS — I214 Non-ST elevation (NSTEMI) myocardial infarction: Secondary | ICD-10-CM | POA: Diagnosis not present

## 2017-09-08 DIAGNOSIS — I2510 Atherosclerotic heart disease of native coronary artery without angina pectoris: Secondary | ICD-10-CM | POA: Diagnosis not present

## 2017-09-08 DIAGNOSIS — N183 Chronic kidney disease, stage 3 (moderate): Secondary | ICD-10-CM | POA: Diagnosis not present

## 2017-09-08 NOTE — Telephone Encounter (Signed)
Copied from [employer] [id number]. Topic: Quick Communication - Rx Refill/Question >> Sep 08, 2017  3:16 PM Mcneil, Ja-Kwan wrote: Medication: insulin aspart (NOVOLOG) 100 UNIT/ML injection and glucose blood (ONETOUCH VERIO) test strip.  Apolonio Schneiders with [facility] stated pt will need syringes if the medication is in a vial or tips if it is the flex pen.  Has the patient contacted their pharmacy? No  Preferred Pharmacy (with phone number or street name): [facility] [address], [place] - [address] [phone] (Phone) [phone] (Fax)  Agent: Please be advised that RX refills may take up to 3 business days. We ask that you follow-up with your pharmacy.

## 2017-09-08 NOTE — Telephone Encounter (Signed)
Call to pharmacy- they do not have this Rx on file for patient- can this be faxed over to them due to the length of the directions- it is too long to send electronically.

## 2017-09-08 NOTE — Telephone Encounter (Signed)
See note

## 2017-09-09 DIAGNOSIS — I130 Hypertensive heart and chronic kidney disease with heart failure and stage 1 through stage 4 chronic kidney disease, or unspecified chronic kidney disease: Secondary | ICD-10-CM | POA: Diagnosis not present

## 2017-09-09 DIAGNOSIS — E1122 Type 2 diabetes mellitus with diabetic chronic kidney disease: Secondary | ICD-10-CM | POA: Diagnosis not present

## 2017-09-09 DIAGNOSIS — I2510 Atherosclerotic heart disease of native coronary artery without angina pectoris: Secondary | ICD-10-CM | POA: Diagnosis not present

## 2017-09-09 DIAGNOSIS — I5030 Unspecified diastolic (congestive) heart failure: Secondary | ICD-10-CM | POA: Diagnosis not present

## 2017-09-09 DIAGNOSIS — I214 Non-ST elevation (NSTEMI) myocardial infarction: Secondary | ICD-10-CM | POA: Diagnosis not present

## 2017-09-09 DIAGNOSIS — N183 Chronic kidney disease, stage 3 (moderate): Secondary | ICD-10-CM | POA: Diagnosis not present

## 2017-09-09 NOTE — Telephone Encounter (Signed)
Please advise on the Novolog prescription. I called the pharmacy and [name] states that they have never filled this. It looks like it was given at the hospital.

## 2017-09-12 DIAGNOSIS — I130 Hypertensive heart and chronic kidney disease with heart failure and stage 1 through stage 4 chronic kidney disease, or unspecified chronic kidney disease: Secondary | ICD-10-CM | POA: Diagnosis not present

## 2017-09-12 DIAGNOSIS — I214 Non-ST elevation (NSTEMI) myocardial infarction: Secondary | ICD-10-CM | POA: Diagnosis not present

## 2017-09-12 DIAGNOSIS — I509 Heart failure, unspecified: Secondary | ICD-10-CM | POA: Diagnosis not present

## 2017-09-12 DIAGNOSIS — N183 Chronic kidney disease, stage 3 unspecified: Secondary | ICD-10-CM | POA: Insufficient documentation

## 2017-09-12 DIAGNOSIS — I252 Old myocardial infarction: Secondary | ICD-10-CM | POA: Diagnosis not present

## 2017-09-12 DIAGNOSIS — Z7982 Long term (current) use of aspirin: Secondary | ICD-10-CM | POA: Diagnosis not present

## 2017-09-12 DIAGNOSIS — I2510 Atherosclerotic heart disease of native coronary artery without angina pectoris: Secondary | ICD-10-CM | POA: Diagnosis not present

## 2017-09-12 DIAGNOSIS — I350 Nonrheumatic aortic (valve) stenosis: Secondary | ICD-10-CM | POA: Diagnosis not present

## 2017-09-12 DIAGNOSIS — E782 Mixed hyperlipidemia: Secondary | ICD-10-CM | POA: Insufficient documentation

## 2017-09-12 DIAGNOSIS — I119 Hypertensive heart disease without heart failure: Secondary | ICD-10-CM | POA: Diagnosis not present

## 2017-09-12 DIAGNOSIS — E1122 Type 2 diabetes mellitus with diabetic chronic kidney disease: Secondary | ICD-10-CM | POA: Diagnosis not present

## 2017-09-12 DIAGNOSIS — I5030 Unspecified diastolic (congestive) heart failure: Secondary | ICD-10-CM | POA: Diagnosis not present

## 2017-09-12 DIAGNOSIS — E119 Type 2 diabetes mellitus without complications: Secondary | ICD-10-CM | POA: Diagnosis not present

## 2017-09-12 NOTE — Telephone Encounter (Signed)
Left message for patient to call.

## 2017-09-12 NOTE — Telephone Encounter (Signed)
Call patient to clarify diabetes regimen and numbers.

## 2017-09-14 DIAGNOSIS — I130 Hypertensive heart and chronic kidney disease with heart failure and stage 1 through stage 4 chronic kidney disease, or unspecified chronic kidney disease: Secondary | ICD-10-CM | POA: Diagnosis not present

## 2017-09-14 DIAGNOSIS — I5030 Unspecified diastolic (congestive) heart failure: Secondary | ICD-10-CM | POA: Diagnosis not present

## 2017-09-14 DIAGNOSIS — I214 Non-ST elevation (NSTEMI) myocardial infarction: Secondary | ICD-10-CM | POA: Diagnosis not present

## 2017-09-14 DIAGNOSIS — I2510 Atherosclerotic heart disease of native coronary artery without angina pectoris: Secondary | ICD-10-CM | POA: Diagnosis not present

## 2017-09-14 DIAGNOSIS — E1122 Type 2 diabetes mellitus with diabetic chronic kidney disease: Secondary | ICD-10-CM | POA: Diagnosis not present

## 2017-09-14 DIAGNOSIS — N183 Chronic kidney disease, stage 3 (moderate): Secondary | ICD-10-CM | POA: Diagnosis not present

## 2017-09-15 DIAGNOSIS — N183 Chronic kidney disease, stage 3 (moderate): Secondary | ICD-10-CM | POA: Diagnosis not present

## 2017-09-15 DIAGNOSIS — I5030 Unspecified diastolic (congestive) heart failure: Secondary | ICD-10-CM | POA: Diagnosis not present

## 2017-09-15 DIAGNOSIS — I214 Non-ST elevation (NSTEMI) myocardial infarction: Secondary | ICD-10-CM | POA: Diagnosis not present

## 2017-09-15 DIAGNOSIS — E1122 Type 2 diabetes mellitus with diabetic chronic kidney disease: Secondary | ICD-10-CM | POA: Diagnosis not present

## 2017-09-15 DIAGNOSIS — I2510 Atherosclerotic heart disease of native coronary artery without angina pectoris: Secondary | ICD-10-CM | POA: Diagnosis not present

## 2017-09-15 DIAGNOSIS — I130 Hypertensive heart and chronic kidney disease with heart failure and stage 1 through stage 4 chronic kidney disease, or unspecified chronic kidney disease: Secondary | ICD-10-CM | POA: Diagnosis not present

## 2017-09-16 ENCOUNTER — Other Ambulatory Visit: Payer: Self-pay | Admitting: Family Medicine

## 2017-09-16 DIAGNOSIS — G43009 Migraine without aura, not intractable, without status migrainosus: Secondary | ICD-10-CM

## 2017-09-16 DIAGNOSIS — G894 Chronic pain syndrome: Secondary | ICD-10-CM

## 2017-09-16 NOTE — Telephone Encounter (Signed)
Ok to fill 

## 2017-09-19 DIAGNOSIS — I214 Non-ST elevation (NSTEMI) myocardial infarction: Secondary | ICD-10-CM | POA: Diagnosis not present

## 2017-09-19 DIAGNOSIS — N183 Chronic kidney disease, stage 3 (moderate): Secondary | ICD-10-CM | POA: Diagnosis not present

## 2017-09-19 DIAGNOSIS — I2510 Atherosclerotic heart disease of native coronary artery without angina pectoris: Secondary | ICD-10-CM | POA: Diagnosis not present

## 2017-09-19 DIAGNOSIS — E1122 Type 2 diabetes mellitus with diabetic chronic kidney disease: Secondary | ICD-10-CM | POA: Diagnosis not present

## 2017-09-19 DIAGNOSIS — I5030 Unspecified diastolic (congestive) heart failure: Secondary | ICD-10-CM | POA: Diagnosis not present

## 2017-09-19 DIAGNOSIS — I130 Hypertensive heart and chronic kidney disease with heart failure and stage 1 through stage 4 chronic kidney disease, or unspecified chronic kidney disease: Secondary | ICD-10-CM | POA: Diagnosis not present

## 2017-09-19 NOTE — Telephone Encounter (Signed)
Left message for patient to return call.

## 2017-09-21 ENCOUNTER — Telehealth: Payer: Self-pay | Admitting: Family Medicine

## 2017-09-21 DIAGNOSIS — I130 Hypertensive heart and chronic kidney disease with heart failure and stage 1 through stage 4 chronic kidney disease, or unspecified chronic kidney disease: Secondary | ICD-10-CM | POA: Diagnosis not present

## 2017-09-21 DIAGNOSIS — I214 Non-ST elevation (NSTEMI) myocardial infarction: Secondary | ICD-10-CM | POA: Diagnosis not present

## 2017-09-21 DIAGNOSIS — N183 Chronic kidney disease, stage 3 (moderate): Secondary | ICD-10-CM | POA: Diagnosis not present

## 2017-09-21 DIAGNOSIS — E1122 Type 2 diabetes mellitus with diabetic chronic kidney disease: Secondary | ICD-10-CM | POA: Diagnosis not present

## 2017-09-21 DIAGNOSIS — I5030 Unspecified diastolic (congestive) heart failure: Secondary | ICD-10-CM | POA: Diagnosis not present

## 2017-09-21 DIAGNOSIS — I2510 Atherosclerotic heart disease of native coronary artery without angina pectoris: Secondary | ICD-10-CM | POA: Diagnosis not present

## 2017-09-21 NOTE — Telephone Encounter (Signed)
Please advise 

## 2017-09-21 NOTE — Telephone Encounter (Signed)
Spoke with home health nurse about blister. Apolonio Schneiders stated that they needed verbal orders to place Vaseline gauze and Kerlix one the wound. I gave verbal orders for this. I explained that the patient would need to be seen for an oral antibiotic. Nurse understood this. I called the patients daughter and explained to her. Patient daughter said that they would just wait to be seen at the office visit on Monday.  I advise that if the wound gets worse before then that they would need to take her to an Urgent Care or the ER. Patients daughter verbalized understanding and said that it is looking better.

## 2017-09-21 NOTE — Telephone Encounter (Signed)
Copied from [place] [phone]. Topic: Quick Communication - See Telephone Encounter >> Sep 21, 2017 11:26 AM Ahmed Prima L wrote: CRM for notification. See Telephone encounter for: 09/21/17.  Apolonio Schneiders, RN with [facility] called and said she has a blister to her right heel. Her daughter told her she fell on 9/9 when she went to see her heart doctor. Two days later, her daughter thought her right foot was hurting, she noticed that it was a big blister with fluid in it/also red. Apolonio Schneiders consulted the wound care nurse. The wound care nurse recommends to clean it with Saline and wrap it and keep it closed at all times. It needs to be changed every other day. The wound care nurse suggested that she needs a oral antibiotic. Call back @ [phone].

## 2017-09-21 NOTE — Telephone Encounter (Signed)
Patient is coming Monday for appointment. She has not returned call.

## 2017-09-21 NOTE — Telephone Encounter (Signed)
Needs visit if antibiotic recommended.

## 2017-09-21 NOTE — Telephone Encounter (Signed)
See note

## 2017-09-22 DIAGNOSIS — I214 Non-ST elevation (NSTEMI) myocardial infarction: Secondary | ICD-10-CM | POA: Diagnosis not present

## 2017-09-22 DIAGNOSIS — I130 Hypertensive heart and chronic kidney disease with heart failure and stage 1 through stage 4 chronic kidney disease, or unspecified chronic kidney disease: Secondary | ICD-10-CM | POA: Diagnosis not present

## 2017-09-22 DIAGNOSIS — I5030 Unspecified diastolic (congestive) heart failure: Secondary | ICD-10-CM | POA: Diagnosis not present

## 2017-09-22 DIAGNOSIS — N183 Chronic kidney disease, stage 3 (moderate): Secondary | ICD-10-CM | POA: Diagnosis not present

## 2017-09-22 DIAGNOSIS — I2510 Atherosclerotic heart disease of native coronary artery without angina pectoris: Secondary | ICD-10-CM | POA: Diagnosis not present

## 2017-09-22 DIAGNOSIS — E1122 Type 2 diabetes mellitus with diabetic chronic kidney disease: Secondary | ICD-10-CM | POA: Diagnosis not present

## 2017-09-23 DIAGNOSIS — I214 Non-ST elevation (NSTEMI) myocardial infarction: Secondary | ICD-10-CM | POA: Diagnosis not present

## 2017-09-23 DIAGNOSIS — I2510 Atherosclerotic heart disease of native coronary artery without angina pectoris: Secondary | ICD-10-CM | POA: Diagnosis not present

## 2017-09-23 DIAGNOSIS — N183 Chronic kidney disease, stage 3 (moderate): Secondary | ICD-10-CM | POA: Diagnosis not present

## 2017-09-23 DIAGNOSIS — E1122 Type 2 diabetes mellitus with diabetic chronic kidney disease: Secondary | ICD-10-CM | POA: Diagnosis not present

## 2017-09-23 DIAGNOSIS — I5030 Unspecified diastolic (congestive) heart failure: Secondary | ICD-10-CM | POA: Diagnosis not present

## 2017-09-23 DIAGNOSIS — I130 Hypertensive heart and chronic kidney disease with heart failure and stage 1 through stage 4 chronic kidney disease, or unspecified chronic kidney disease: Secondary | ICD-10-CM | POA: Diagnosis not present

## 2017-09-26 ENCOUNTER — Ambulatory Visit: Payer: Medicare Other | Admitting: Family Medicine

## 2017-09-26 NOTE — Progress Notes (Deleted)
[name] is a 72 y.o. female is here for follow up.  History of Present Illness:   {CMA SCRIBE ATTESTATION}  HPI:   Apolonio Schneiders, RN with [facility] called and said she has a blister to her right heel. Her daughter told her she fell on 9/9 when she went to see her heart doctor. Two days later, her daughter thought her right foot was hurting, she noticed that it was a big blister with fluid in it/also red. Apolonio Schneiders consulted the wound care nurse. The wound care nurse recommends to clean it with Saline and wrap it and keep it closed at all times. It needs to be changed every other day. The wound care nurse suggested that she needs a oral antibiotic.  Health Maintenance Due  Topic Date Due  . OPHTHALMOLOGY EXAM  04/20/1955  . TETANUS/TDAP  04/19/1964  . DEXA SCAN  04/20/2010  . PNA vac Low Risk Adult (1 of 2 - PCV13) 04/20/2010  . FOOT EXAM  07/15/2017  . INFLUENZA VACCINE  08/04/2017   No flowsheet data found. PMHx, SurgHx, SocialHx, FamHx, Medications, and Allergies were reviewed in the Visit Navigator and updated as appropriate.   Patient Active Problem List   Diagnosis Date Noted  . Impaired mobility and ADLs 08/29/2017  . NSTEMI (non-ST elevated myocardial infarction) ([place]) 08/22/2017  . Cerebral thrombosis with cerebral infarction 07/27/2017  . Anemia of chronic disease 07/22/2017  . LV dysfunction 10/06/2016  . 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 Acute on chronic diastolic CHF (congestive heart failure) ([place]) 09/09/2016  . Migraine 08/20/2016  . Morbid obesity ([place]) 08/20/2016  . GAD (generalized anxiety disorder) 10/02/2015  . DOE (dyspnea on exertion), on O2 10/02/2015  . Hyperlipidemia associated with type 2 diabetes mellitus ([place]), on Crestor 10/02/2015  . Nonrheumatic aortic valve stenosis 10/02/2015  . Old MI (myocardial infarction) 10/02/2015  . Primary insomnia 11/03/2014  . Weakness generalized  08/28/2014  . Seizure disorder ([place]) 08/28/2014  . 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 Coronary artery disease of native artery of native heart with stable angina pectoris ([place]) 06/02/2010  . Depression, recurrent ([place]), on Zoloft 05/20/2007  . Hypertension associated with diabetes ([place]) 05/20/2007  . Diaphragmatic hernia 05/20/2007  . Diverticulosis of colon 05/20/2007  . IBS 05/20/2007  . Fatty liver 05/20/2007  . Hypothyroidism 05/20/2007  . Hx of transient ischemic attack (TIA) 05/20/2007   Social History   Tobacco Use  . Smoking status: Former Smoker    Last attempt to quit: 06/02/1970    Years since quitting: 47.3  . Smokeless tobacco: Never Used  Substance Use Topics  . Alcohol use: No  . Drug use: No   Current Medications and Allergies:   Current Outpatient Medications:  .  acetaminophen (TYLENOL) 325 MG tablet, Take 2 tablets (650 mg total) by mouth every 4 (four) hours as needed for headache or mild pain., Disp: 30 tablet, Rfl: 0 .  albuterol (PROVENTIL) (2.5 MG/3ML) 0.083% nebulizer solution, Take 3 mLs (2.5 mg total) by nebulization every 4 (four) hours as needed for wheezing or shortness of breath., Disp: 75 mL, Rfl: 12 .  ALPRAZolam (XANAX) 0.25 MG tablet, Take 1 tablet (0.25 mg total) by mouth at bedtime as needed for anxiety., Disp: 10 tablet, Rfl: 0 .  ALPRAZolam (XANAX) 0.5 MG tablet, Take 1 tablet (0.5 mg total) by mouth 2 (two) times daily., Disp: 20 tablet, Rfl: 0 .  aspirin 325 MG EC tablet, Take 1 tablet (325 mg total) by mouth daily., Disp: 30 tablet, Rfl: 0 .  clopidogrel (PLAVIX) 75 MG tablet, Take 1 tablet (75 mg total) by mouth daily., Disp: 30 tablet, Rfl: 0 .  furosemide (LASIX) 20 MG tablet, Take 1 tablet (20 mg total) by mouth 2 (two) times daily., Disp: 30 tablet, Rfl: 0 .  gabapentin (NEURONTIN) 300 MG capsule, Take 1 capsule (300 mg total) by mouth at bedtime., Disp: 30 capsule, Rfl: 0 .  glucose blood (ONETOUCH  VERIO) test strip, 1 each by Other route daily. And lancets 1/day. Dx code E11.9., Disp: 100 each, Rfl: 3 .  insulin aspart (NOVOLOG) 100 UNIT/ML injection, Inject 0-9 Units into the skin 3 (three) times daily with meals. Sliding scale CBG 70 - 120: 0 units CBG 121 - 150: 1 unit,  CBG 151 - 200: 2 units,  CBG 201 - 250: 3 units,  CBG 251 - 300: 5 units,  CBG 301 - 350: 7 units,  CBG 351 - 400: 9 units   CBG > 400: 9 units and notify your MD, Disp: 10 mL, Rfl: 11 .  insulin detemir (LEVEMIR) 100 UNIT/ML injection, Inject 0.1 mLs (10 Units total) into the skin daily., Disp: 10 mL, Rfl: 0 .  isosorbide mononitrate (IMDUR) 60 MG 24 hr tablet, Take 1 tablet (60 mg total) by mouth daily., Disp: 30 tablet, Rfl: 0 .  levETIRAcetam (KEPPRA) 1000 MG tablet, Take 1 tablet (1,000 mg total) by mouth 2 (two) times daily., Disp: 60 tablet, Rfl: 0 .  loratadine (CLARITIN) 10 MG tablet, Take 1 tablet (10 mg total) by mouth daily as needed for allergies., Disp: 30 tablet, Rfl: 0 .  losartan (COZAAR) 25 MG tablet, Take 1 tablet (25 mg total) by mouth daily., Disp: 30 tablet, Rfl: 0 .  Melatonin 3 MG TABS, Take 1 tablet (3 mg total) by mouth at bedtime., Disp: 30 tablet, Rfl: 0 .  metoprolol tartrate (LOPRESSOR) 50 MG tablet, Take 1 tablet (50 mg total) by mouth daily., Disp: 30 tablet, Rfl: 0 .  ondansetron (ZOFRAN) 4 MG tablet, Take 1 tablet (4 mg total) by mouth every 6 (six) hours as needed for nausea or vomiting., Disp: 20 tablet, Rfl: 0 .  oxymetazoline (AFRIN) 0.05 % nasal spray, Place 1 spray into both nostrils 2 (two) times daily as needed (nosebleeds)., Disp: 30 mL, Rfl: 0 .  polyethylene glycol (MIRALAX / GLYCOLAX) packet, Take 17 g by mouth daily as needed for mild constipation., Disp: 14 each, Rfl: 0 .  potassium chloride (K-DUR,KLOR-CON) 10 MEQ tablet, Take 1 tablet (10 mEq total) by mouth daily., Disp: 30 tablet, Rfl: 0 .  rizatriptan (MAXALT) 10 MG tablet, TAKE ONE TABLET BY MOUTH DAILY AS NEEDED FOR  CHRONIC PAIN, Disp: 10 tablet, Rfl: 0 .  rosuvastatin (CRESTOR) 20 MG tablet, Take 1 tablet (20 mg total) by mouth every evening., Disp: 30 tablet, Rfl: 0 .  sertraline (ZOLOFT) 50 MG tablet, Take 1 tablet (50 mg total) by mouth at bedtime., Disp: 90 tablet, Rfl: 3 .  sodium chloride (OCEAN) 0.65 % SOLN nasal spray, Place 2 sprays into both nostrils 3 (three) times daily., Disp: 15 mL, Rfl: 0 .  traMADol (ULTRAM) 50 MG tablet, TAKE ONE TABLET BY MOUTH EVERY 6 HOURS AS NEEDED, Disp: 20 tablet, Rfl: 0  Allergies  Allergen Reactions  . Codeine Itching   Review of Systems   Pertinent items are noted in the HPI. Otherwise, ROS is  negative.  Vitals:  There were no vitals filed for this visit.   There is no height or weight on file to calculate BMI.  Physical Exam:   Physical Exam  Results for orders placed or performed during the hospital encounter of 08/22/17  MRSA PCR Screening  Result Value Ref Range   MRSA by PCR NEGATIVE NEGATIVE  CBC  Result Value Ref Range   WBC 4.5 3.6 - 11.0 K/uL   RBC 2.95 (L) 3.80 - 5.20 MIL/uL   Hemoglobin 9.1 (L) 12.0 - 16.0 g/dL   HCT 27.4 (L) 35.0 - 47.0 %   MCV 92.9 80.0 - 100.0 fL   MCH 30.9 26.0 - 34.0 pg   MCHC 33.2 32.0 - 36.0 g/dL   RDW 19.2 (H) 11.5 - 14.5 %   Platelets 187 150 - 440 K/uL  Basic metabolic panel  Result Value Ref Range   Sodium 140 135 - 145 mmol/L   Potassium 3.7 3.5 - 5.1 mmol/L   Chloride 105 98 - 111 mmol/L   CO2 29 22 - 32 mmol/L   Glucose, Bld 190 (H) 70 - 99 mg/dL   BUN 31 (H) 8 - 23 mg/dL   Creatinine, Ser 1.18 (H) 0.44 - 1.00 mg/dL   Calcium 7.6 (L) 8.9 - 10.3 mg/dL   GFR calc non Af Amer 45 (L) >60 mL/min   GFR calc Af Amer 52 (L) >60 mL/min   Anion gap 6 5 - 15  Troponin I  Result Value Ref Range   Troponin I 1.29 (HH) <0.03 ng/mL  Protime-INR  Result Value Ref Range   Prothrombin Time 14.1 11.4 - 15.2 seconds   INR 1.10   APTT  Result Value Ref Range   aPTT 33 24 - 36 seconds  Basic metabolic  panel  Result Value Ref Range   Sodium 142 135 - 145 mmol/L   Potassium 3.8 3.5 - 5.1 mmol/L   Chloride 104 98 - 111 mmol/L   CO2 33 (H) 22 - 32 mmol/L   Glucose, Bld 177 (H) 70 - 99 mg/dL   BUN 33 (H) 8 - 23 mg/dL   Creatinine, Ser 1.32 (H) 0.44 - 1.00 mg/dL   Calcium 8.3 (L) 8.9 - 10.3 mg/dL   GFR calc non Af Amer 39 (L) >60 mL/min   GFR calc Af Amer 45 (L) >60 mL/min   Anion gap 5 5 - 15  CBC  Result Value Ref Range   WBC 4.2 3.6 - 11.0 K/uL   RBC 2.88 (L) 3.80 - 5.20 MIL/uL   Hemoglobin 8.8 (L) 12.0 - 16.0 g/dL   HCT 26.8 (L) 35.0 - 47.0 %   MCV 93.2 80.0 - 100.0 fL   MCH 30.6 26.0 - 34.0 pg   MCHC 32.9 32.0 - 36.0 g/dL   RDW 19.1 (H) 11.5 - 14.5 %   Platelets 181 150 - 440 K/uL  Glucose, capillary  Result Value Ref Range   Glucose-Capillary 198 (H) 70 - 99 mg/dL   Comment 1 Notify RN    Comment 2 Document in Chart   Heparin level (unfractionated)  Result Value Ref Range   Heparin Unfractionated <0.10 (L) 0.30 - 0.70 IU/mL  Troponin I  Result Value Ref Range   Troponin I 1.24 (HH) <0.03 ng/mL  Glucose, capillary  Result Value Ref Range   Glucose-Capillary 104 (H) 70 - 99 mg/dL   Comment 1 Notify RN   Troponin I  Result Value Ref Range   Troponin I  1.24 (HH) <0.03 ng/mL  Troponin I  Result Value Ref Range   Troponin I 1.23 (HH) <0.03 ng/mL  Troponin I  Result Value Ref Range   Troponin I 1.22 (HH) <0.03 ng/mL  Heparin level (unfractionated)  Result Value Ref Range   Heparin Unfractionated 0.31 0.30 - 0.70 IU/mL  Glucose, capillary  Result Value Ref Range   Glucose-Capillary 95 70 - 99 mg/dL   Comment 1 Notify RN   Glucose, capillary  Result Value Ref Range   Glucose-Capillary 108 (H) 70 - 99 mg/dL   Comment 1 Notify RN   Glucose, capillary  Result Value Ref Range   Glucose-Capillary 131 (H) 70 - 99 mg/dL   Comment 1 Notify RN    Comment 2 Document in Chart   Heparin level (unfractionated)  Result Value Ref Range   Heparin Unfractionated 0.45 0.30 -  0.70 IU/mL  Glucose, capillary  Result Value Ref Range   Glucose-Capillary 76 70 - 99 mg/dL  Glucose, capillary  Result Value Ref Range   Glucose-Capillary 126 (H) 70 - 99 mg/dL  Heparin level (unfractionated)  Result Value Ref Range   Heparin Unfractionated <0.10 (L) 0.30 - 0.70 IU/mL  CBC  Result Value Ref Range   WBC 4.7 3.6 - 11.0 K/uL   RBC 3.05 (L) 3.80 - 5.20 MIL/uL   Hemoglobin 9.4 (L) 12.0 - 16.0 g/dL   HCT 28.2 (L) 35.0 - 47.0 %   MCV 92.5 80.0 - 100.0 fL   MCH 30.7 26.0 - 34.0 pg   MCHC 33.2 32.0 - 36.0 g/dL   RDW 19.2 (H) 11.5 - 14.5 %   Platelets 220 150 - 440 K/uL  Basic metabolic panel  Result Value Ref Range   Sodium 141 135 - 145 mmol/L   Potassium 3.7 3.5 - 5.1 mmol/L   Chloride 105 98 - 111 mmol/L   CO2 31 22 - 32 mmol/L   Glucose, Bld 94 70 - 99 mg/dL   BUN 24 (H) 8 - 23 mg/dL   Creatinine, Ser 0.98 0.44 - 1.00 mg/dL   Calcium 8.8 (L) 8.9 - 10.3 mg/dL   GFR calc non Af Amer 56 (L) >60 mL/min   GFR calc Af Amer >60 >60 mL/min   Anion gap 5 5 - 15  Glucose, capillary  Result Value Ref Range   Glucose-Capillary 97 70 - 99 mg/dL  Glucose, capillary  Result Value Ref Range   Glucose-Capillary 112 (H) 70 - 99 mg/dL   Comment 1 Notify RN    Comment 2 Document in Chart   Glucose, capillary  Result Value Ref Range   Glucose-Capillary 81 70 - 99 mg/dL   Comment 1 Notify RN   Glucose, capillary  Result Value Ref Range   Glucose-Capillary 140 (H) 70 - 99 mg/dL   Comment 1 Notify RN   Glucose, capillary  Result Value Ref Range   Glucose-Capillary 111 (H) 70 - 99 mg/dL   Comment 1 Notify RN     Assessment and Plan:   There are no diagnoses linked to this encounter.  . Reviewed expectations re: course of current medical issues. . Discussed self-management of symptoms. . Outlined signs and symptoms indicating need for more acute intervention. . Patient verbalized understanding and all questions were answered. Marland Kitchen Health Maintenance issues including  appropriate healthy diet, exercise, and smoking avoidance were discussed with patient. . See orders for this visit as documented in the electronic medical record. . Patient received an After Visit Summary.  ***  CMA served as Education administrator during this visit. History, Physical, and Plan performed by medical provider. The above documentation has been reviewed and is accurate and complete. Briscoe Deutscher, D.O.  Briscoe Deutscher, DO [place], [facility] 09/26/2017

## 2017-09-27 DIAGNOSIS — I214 Non-ST elevation (NSTEMI) myocardial infarction: Secondary | ICD-10-CM | POA: Diagnosis not present

## 2017-09-27 DIAGNOSIS — I2510 Atherosclerotic heart disease of native coronary artery without angina pectoris: Secondary | ICD-10-CM | POA: Diagnosis not present

## 2017-09-27 DIAGNOSIS — N183 Chronic kidney disease, stage 3 (moderate): Secondary | ICD-10-CM | POA: Diagnosis not present

## 2017-09-27 DIAGNOSIS — I5030 Unspecified diastolic (congestive) heart failure: Secondary | ICD-10-CM | POA: Diagnosis not present

## 2017-09-27 DIAGNOSIS — I130 Hypertensive heart and chronic kidney disease with heart failure and stage 1 through stage 4 chronic kidney disease, or unspecified chronic kidney disease: Secondary | ICD-10-CM | POA: Diagnosis not present

## 2017-09-27 DIAGNOSIS — E1122 Type 2 diabetes mellitus with diabetic chronic kidney disease: Secondary | ICD-10-CM | POA: Diagnosis not present

## 2017-09-28 DIAGNOSIS — I5030 Unspecified diastolic (congestive) heart failure: Secondary | ICD-10-CM | POA: Diagnosis not present

## 2017-09-28 DIAGNOSIS — I214 Non-ST elevation (NSTEMI) myocardial infarction: Secondary | ICD-10-CM | POA: Diagnosis not present

## 2017-09-28 DIAGNOSIS — E1122 Type 2 diabetes mellitus with diabetic chronic kidney disease: Secondary | ICD-10-CM | POA: Diagnosis not present

## 2017-09-28 DIAGNOSIS — N183 Chronic kidney disease, stage 3 (moderate): Secondary | ICD-10-CM | POA: Diagnosis not present

## 2017-09-28 DIAGNOSIS — I130 Hypertensive heart and chronic kidney disease with heart failure and stage 1 through stage 4 chronic kidney disease, or unspecified chronic kidney disease: Secondary | ICD-10-CM | POA: Diagnosis not present

## 2017-09-28 DIAGNOSIS — I2510 Atherosclerotic heart disease of native coronary artery without angina pectoris: Secondary | ICD-10-CM | POA: Diagnosis not present

## 2017-09-29 DIAGNOSIS — E1122 Type 2 diabetes mellitus with diabetic chronic kidney disease: Secondary | ICD-10-CM | POA: Diagnosis not present

## 2017-09-29 DIAGNOSIS — I2510 Atherosclerotic heart disease of native coronary artery without angina pectoris: Secondary | ICD-10-CM | POA: Diagnosis not present

## 2017-09-29 DIAGNOSIS — I130 Hypertensive heart and chronic kidney disease with heart failure and stage 1 through stage 4 chronic kidney disease, or unspecified chronic kidney disease: Secondary | ICD-10-CM | POA: Diagnosis not present

## 2017-09-29 DIAGNOSIS — I5030 Unspecified diastolic (congestive) heart failure: Secondary | ICD-10-CM | POA: Diagnosis not present

## 2017-09-29 DIAGNOSIS — I214 Non-ST elevation (NSTEMI) myocardial infarction: Secondary | ICD-10-CM | POA: Diagnosis not present

## 2017-09-29 DIAGNOSIS — N183 Chronic kidney disease, stage 3 (moderate): Secondary | ICD-10-CM | POA: Diagnosis not present

## 2017-09-30 ENCOUNTER — Telehealth: Payer: Self-pay | Admitting: Family Medicine

## 2017-09-30 NOTE — Telephone Encounter (Signed)
Copied from [place] [phone]. Topic: Quick Communication - See Telephone Encounter >> Sep 30, 2017  9:06 AM Conception Chancy, NT wrote: CRM for notification. See Telephone encounter for: 09/30/17.  Lenna Sciara is calling from [place] and states the patient fell on Monday 09/26/17. She did not injure herself.   Melissa # [phone]

## 2017-09-30 NOTE — Telephone Encounter (Signed)
Patient missed follow up appointment. I'm not sure if she still wants me to be her physician.

## 2017-09-30 NOTE — Telephone Encounter (Signed)
FYI, see message. 

## 2017-10-03 NOTE — Telephone Encounter (Signed)
Patient has an appointment tomorrow 

## 2017-10-04 ENCOUNTER — Encounter: Payer: Self-pay | Admitting: Family Medicine

## 2017-10-04 ENCOUNTER — Ambulatory Visit (INDEPENDENT_AMBULATORY_CARE_PROVIDER_SITE_OTHER): Payer: Medicare Other | Admitting: Family Medicine

## 2017-10-04 VITALS — BP 128/62 | HR 68 | Temp 97.6°F | Ht 60.0 in | Wt 223.4 lb

## 2017-10-04 DIAGNOSIS — G43009 Migraine without aura, not intractable, without status migrainosus: Secondary | ICD-10-CM

## 2017-10-04 DIAGNOSIS — E1169 Type 2 diabetes mellitus with other specified complication: Secondary | ICD-10-CM | POA: Diagnosis not present

## 2017-10-04 DIAGNOSIS — Z23 Encounter for immunization: Secondary | ICD-10-CM

## 2017-10-04 DIAGNOSIS — F419 Anxiety disorder, unspecified: Secondary | ICD-10-CM | POA: Diagnosis not present

## 2017-10-04 DIAGNOSIS — G40909 Epilepsy, unspecified, not intractable, without status epilepticus: Secondary | ICD-10-CM

## 2017-10-04 DIAGNOSIS — E1159 Type 2 diabetes mellitus with other circulatory complications: Secondary | ICD-10-CM | POA: Diagnosis not present

## 2017-10-04 DIAGNOSIS — I10 Essential (primary) hypertension: Secondary | ICD-10-CM

## 2017-10-04 DIAGNOSIS — I6330 Cerebral infarction due to thrombosis of unspecified cerebral artery: Secondary | ICD-10-CM | POA: Diagnosis not present

## 2017-10-04 DIAGNOSIS — E785 Hyperlipidemia, unspecified: Secondary | ICD-10-CM

## 2017-10-04 DIAGNOSIS — E669 Obesity, unspecified: Secondary | ICD-10-CM

## 2017-10-04 DIAGNOSIS — I214 Non-ST elevation (NSTEMI) myocardial infarction: Secondary | ICD-10-CM | POA: Diagnosis not present

## 2017-10-04 DIAGNOSIS — I5033 Acute on chronic diastolic (congestive) heart failure: Secondary | ICD-10-CM

## 2017-10-04 DIAGNOSIS — F339 Major depressive disorder, recurrent, unspecified: Secondary | ICD-10-CM

## 2017-10-04 DIAGNOSIS — I2510 Atherosclerotic heart disease of native coronary artery without angina pectoris: Secondary | ICD-10-CM | POA: Diagnosis not present

## 2017-10-04 DIAGNOSIS — N183 Chronic kidney disease, stage 3 (moderate): Secondary | ICD-10-CM | POA: Diagnosis not present

## 2017-10-04 DIAGNOSIS — I5030 Unspecified diastolic (congestive) heart failure: Secondary | ICD-10-CM | POA: Diagnosis not present

## 2017-10-04 DIAGNOSIS — E1142 Type 2 diabetes mellitus with diabetic polyneuropathy: Secondary | ICD-10-CM | POA: Diagnosis not present

## 2017-10-04 DIAGNOSIS — I130 Hypertensive heart and chronic kidney disease with heart failure and stage 1 through stage 4 chronic kidney disease, or unspecified chronic kidney disease: Secondary | ICD-10-CM | POA: Diagnosis not present

## 2017-10-04 DIAGNOSIS — E1122 Type 2 diabetes mellitus with diabetic chronic kidney disease: Secondary | ICD-10-CM | POA: Diagnosis not present

## 2017-10-04 LAB — COMPREHENSIVE METABOLIC PANEL
ALT: 7 U/L (ref 0–35)
AST: 16 U/L (ref 0–37)
Albumin: 3.3 g/dL — ABNORMAL LOW (ref 3.5–5.2)
Alkaline Phosphatase: 75 U/L (ref 39–117)
BUN: 26 mg/dL — ABNORMAL HIGH (ref 6–23)
CO2: 32 mEq/L (ref 19–32)
Calcium: 8.7 mg/dL (ref 8.4–10.5)
Chloride: 102 mEq/L (ref 96–112)
Creatinine, Ser: 1.18 mg/dL (ref 0.40–1.20)
GFR: 47.79 mL/min — ABNORMAL LOW (ref >60.00)
Glucose, Bld: 107 mg/dL — ABNORMAL HIGH (ref 70–99)
Potassium: 4.5 mEq/L (ref 3.5–5.1)
Sodium: 140 mEq/L (ref 135–145)
Total Bilirubin: 0.3 mg/dL (ref 0.2–1.2)
Total Protein: 6.3 g/dL (ref 6.0–8.3)

## 2017-10-04 LAB — CBC WITH DIFFERENTIAL/PLATELET
Basophils Absolute: 0.1 10*3/uL (ref 0.0–0.1)
Basophils Relative: 1.1 % (ref 0.0–3.0)
Eosinophils Absolute: 0.2 10*3/uL (ref 0.0–0.7)
Eosinophils Relative: 2.3 % (ref 0.0–5.0)
HCT: 29.2 % — ABNORMAL LOW (ref 36.0–46.0)
Hemoglobin: 9.7 g/dL — ABNORMAL LOW (ref 12.0–15.0)
Lymphocytes Relative: 22.8 % (ref 12.0–46.0)
Lymphs Abs: 1.6 10*3/uL (ref 0.7–4.0)
MCHC: 33.2 g/dL (ref 30.0–36.0)
MCV: 93.7 fl (ref 78.0–100.0)
Monocytes Absolute: 0.5 10*3/uL (ref 0.1–1.0)
Monocytes Relative: 6.6 % (ref 3.0–12.0)
Neutro Abs: 4.6 10*3/uL (ref 1.4–7.7)
Neutrophils Relative %: 67.2 % (ref 43.0–77.0)
Platelets: 277 10*3/uL (ref 150.0–400.0)
RBC: 3.12 Mil/uL — ABNORMAL LOW (ref 3.87–5.11)
RDW: 17.5 % — ABNORMAL HIGH (ref 11.5–15.5)
WBC: 6.8 10*3/uL (ref 4.0–10.5)

## 2017-10-04 MED ORDER — ALPRAZOLAM 0.5 MG PO TABS: 0.5000 mg | ORAL_TABLET | Freq: Two times a day (BID) | ORAL | 0 refills | Status: DC

## 2017-10-04 MED ORDER — ROSUVASTATIN CALCIUM 20 MG PO TABS: 20.0000 mg | ORAL_TABLET | Freq: Every evening | ORAL | 0 refills | Status: AC

## 2017-10-04 MED ORDER — POTASSIUM CHLORIDE CRYS ER 10 MEQ PO TBCR: 10.0000 meq | EXTENDED_RELEASE_TABLET | Freq: Every day | ORAL | 0 refills | Status: DC

## 2017-10-04 MED ORDER — METOPROLOL TARTRATE 50 MG PO TABS: 50.0000 mg | ORAL_TABLET | Freq: Every day | ORAL | 0 refills | Status: DC

## 2017-10-04 MED ORDER — GABAPENTIN 300 MG PO CAPS: 300.0000 mg | ORAL_CAPSULE | Freq: Every day | ORAL | 0 refills | Status: DC

## 2017-10-04 MED ORDER — SERTRALINE HCL 50 MG PO TABS: 50.0000 mg | ORAL_TABLET | Freq: Every day | ORAL | 3 refills | Status: DC

## 2017-10-04 MED ORDER — FUROSEMIDE 20 MG PO TABS: 20.0000 mg | ORAL_TABLET | Freq: Two times a day (BID) | ORAL | 0 refills | Status: DC

## 2017-10-04 MED ORDER — ISOSORBIDE MONONITRATE ER 60 MG PO TB24: 60.0000 mg | ORAL_TABLET | Freq: Every day | ORAL | 0 refills | Status: AC

## 2017-10-04 MED ORDER — RIZATRIPTAN BENZOATE 10 MG PO TABS: ORAL_TABLET | ORAL | 0 refills | Status: DC

## 2017-10-04 MED ORDER — ASPIRIN 325 MG PO TBEC: 325.0000 mg | DELAYED_RELEASE_TABLET | Freq: Every day | ORAL | 0 refills | Status: DC

## 2017-10-04 MED ORDER — LEVETIRACETAM 1000 MG PO TABS: 1000.0000 mg | ORAL_TABLET | Freq: Two times a day (BID) | ORAL | 0 refills | Status: DC

## 2017-10-04 MED ORDER — CLOPIDOGREL BISULFATE 75 MG PO TABS: 75.0000 mg | ORAL_TABLET | Freq: Every day | ORAL | 0 refills | Status: DC

## 2017-10-04 MED ORDER — LOSARTAN POTASSIUM 25 MG PO TABS: 25.0000 mg | ORAL_TABLET | Freq: Every day | ORAL | 0 refills | Status: DC

## 2017-10-04 NOTE — Progress Notes (Signed)
[name] is a 72 y.o. female is here for follow up.  History of Present Illness:   [name] CMA acting as scribe for Dr. Juleen China.  HPI: Patient comes in today for her 1 month follow up. She is needing refills on her medications. Patient saw Cardiology on 09/12/17.   Home Health: Patient states that she has everything that she needs at home with Home Health. On the 23rd of this month she will be reevaluated for home health.   Weight: Patients daughter has been weighing patient daily. It has been ranging within three pounds. Patient will be going back home this week. Daughter is concerned that when she gets home she is not going to have the support that she needs. We will refill cardiac medications until her appointment in October.   Nerves: Patient stated that her main concern that she is nervous about what is going on. Patients daughter said that she has been like this her entire life. Patient has been taking the Xanax 0.5 mg BID. Dr. Juleen China told the patient that she would continue to prescribe if she goes to her other providers and continues the getting better. If she continues to check blood pressure and continue to come in for her appointments then Dr. Juleen China said that she would refill.   Neurology: Patient has not had a neurology appointment for a long time. Dr. Juleen China said that she would need to Neurology for the [place].   Health Maintenance Due  Topic Date Due  . OPHTHALMOLOGY EXAM  04/20/1955  . TETANUS/TDAP  04/19/1964  . DEXA SCAN  04/20/2010  . PNA vac Low Risk Adult (1 of 2 - PCV13) 04/20/2010  . FOOT EXAM  07/15/2017  . INFLUENZA VACCINE  08/04/2017   Depression screen PHQ 2/9 10/04/2017  Decreased Interest 0  Down, Depressed, Hopeless 0  PHQ - 2 Score 0  Altered sleeping 2  Tired, decreased energy 0  Change in appetite 0  Feeling bad or failure about yourself  0  Trouble concentrating 0  Moving slowly or fidgety/restless 0  Suicidal thoughts 0  PHQ-9  Score 2  Difficult doing work/chores Not difficult at all   PMHx, SurgHx, SocialHx, FamHx, Medications, and Allergies were reviewed in the Visit Navigator and updated as appropriate.   Patient Active Problem List   Diagnosis Date Noted  . Impaired mobility and ADLs 08/29/2017  . NSTEMI (non-ST elevated myocardial infarction) ([place]) 08/22/2017  . Cerebral thrombosis with cerebral infarction 07/27/2017  . Anemia of chronic disease 07/22/2017  . LV dysfunction 10/06/2016  . 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 Acute on chronic diastolic CHF (congestive heart failure) ([place]) 09/09/2016  . Migraine 08/20/2016  . Morbid obesity ([place]) 08/20/2016  . GAD (generalized anxiety disorder) 10/02/2015  . DOE (dyspnea on exertion), on O2 10/02/2015  . Hyperlipidemia associated with type 2 diabetes mellitus ([place]), on Crestor 10/02/2015  . Nonrheumatic aortic valve stenosis 10/02/2015  . Old MI (myocardial infarction) 10/02/2015  . Primary insomnia 11/03/2014  . Weakness generalized 08/28/2014  . Seizure disorder ([place]) 08/28/2014  . 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 Coronary artery disease of native artery of native heart with stable angina pectoris ([place]) 06/02/2010  . Depression, recurrent ([place]), on Zoloft 05/20/2007  . Hypertension associated with diabetes ([place]) 05/20/2007  . Diaphragmatic hernia 05/20/2007  . Diverticulosis of colon 05/20/2007  . IBS 05/20/2007  . Fatty liver 05/20/2007  .  Hypothyroidism 05/20/2007  . Hx of transient ischemic attack (TIA) 05/20/2007   Social History   Tobacco Use  . Smoking status: Former Smoker    Last attempt to quit: 06/02/1970    Years since quitting: 47.3  . Smokeless tobacco: Never Used  Substance Use Topics  . Alcohol use: No  . Drug use: No   Current Medications and Allergies:   .  acetaminophen (TYLENOL) 325 MG tablet, Take 2 tablets (650 mg  total) by mouth every 4 (four) hours as needed for headache or mild pain., Disp: 30 tablet, Rfl: 0 .  albuterol (PROVENTIL) (2.5 MG/3ML) 0.083% nebulizer solution, Take 3 mLs (2.5 mg total) by nebulization every 4 (four) hours as needed for wheezing or shortness of breath., Disp: 75 mL, Rfl: 12 .  ALPRAZolam (XANAX) 0.25 MG tablet, Take 1 tablet (0.25 mg total) by mouth at bedtime as needed for anxiety., Disp: 10 tablet, Rfl: 0 .  ALPRAZolam (XANAX) 0.5 MG tablet, Take 1 tablet (0.5 mg total) by mouth 2 (two) times daily., Disp: 20 tablet, Rfl: 0 .  aspirin 325 MG EC tablet, Take 1 tablet (325 mg total) by mouth daily., Disp: 30 tablet, Rfl: 0 .  clopidogrel (PLAVIX) 75 MG tablet, Take 1 tablet (75 mg total) by mouth daily., Disp: 30 tablet, Rfl: 0 .  furosemide (LASIX) 20 MG tablet, Take 1 tablet (20 mg total) by mouth 2 (two) times daily., Disp: 30 tablet, Rfl: 0 .  gabapentin (NEURONTIN) 300 MG capsule, Take 1 capsule (300 mg total) by mouth at bedtime., Disp: 30 capsule, Rfl: 0 .  glucose blood (ONETOUCH VERIO) test strip, 1 each by Other route daily. And lancets 1/day. Dx code E11.9., Disp: 100 each, Rfl: 3 .  insulin aspart (NOVOLOG) 100 UNIT/ML injection, Inject 0-9 Units into the skin 3 (three) times daily with meals. Sliding scale CBG 70 - 120: 0 units CBG 121 - 150: 1 unit,  CBG 151 - 200: 2 units,  CBG 201 - 250: 3 units,  CBG 251 - 300: 5 units,  CBG 301 - 350: 7 units,  CBG 351 - 400: 9 units   CBG > 400: 9 units and notify your MD, Disp: 10 mL, Rfl: 11 .  insulin detemir (LEVEMIR) 100 UNIT/ML injection, Inject 0.1 mLs (10 Units total) into the skin daily., Disp: 10 mL, Rfl: 0 .  isosorbide mononitrate (IMDUR) 60 MG 24 hr tablet, Take 1 tablet (60 mg total) by mouth daily., Disp: 30 tablet, Rfl: 0 .  levETIRAcetam (KEPPRA) 1000 MG tablet, Take 1 tablet (1,000 mg total) by mouth 2 (two) times daily., Disp: 60 tablet, Rfl: 0 .  loratadine (CLARITIN) 10 MG tablet, Take 1 tablet (10 mg total)  by mouth daily as needed for allergies., Disp: 30 tablet, Rfl: 0 .  losartan (COZAAR) 25 MG tablet, Take 1 tablet (25 mg total) by mouth daily., Disp: 30 tablet, Rfl: 0 .  Melatonin 3 MG TABS, Take 1 tablet (3 mg total) by mouth at bedtime., Disp: 30 tablet, Rfl: 0 .  metoprolol tartrate (LOPRESSOR) 50 MG tablet, Take 1 tablet (50 mg total) by mouth daily., Disp: 30 tablet, Rfl: 0 .  ondansetron (ZOFRAN) 4 MG tablet, Take 1 tablet (4 mg total) by mouth every 6 (six) hours as needed for nausea or vomiting., Disp: 20 tablet, Rfl: 0 .  oxymetazoline (AFRIN) 0.05 % nasal spray, Place 1 spray into both nostrils 2 (two) times daily as needed (nosebleeds)., Disp: 30 mL, Rfl: 0 .  polyethylene glycol (MIRALAX / GLYCOLAX) packet, Take 17 g by mouth daily as needed for mild constipation., Disp: 14 each, Rfl: 0 .  potassium chloride (K-DUR,KLOR-CON) 10 MEQ tablet, Take 1 tablet (10 mEq total) by mouth daily., Disp: 30 tablet, Rfl: 0 .  rizatriptan (MAXALT) 10 MG tablet, TAKE ONE TABLET BY MOUTH DAILY AS NEEDED FOR CHRONIC PAIN, Disp: 10 tablet, Rfl: 0 .  rosuvastatin (CRESTOR) 20 MG tablet, Take 1 tablet (20 mg total) by mouth every evening., Disp: 30 tablet, Rfl: 0 .  sertraline (ZOLOFT) 50 MG tablet, Take 1 tablet (50 mg total) by mouth at bedtime., Disp: 90 tablet, Rfl: 3 .  sodium chloride (OCEAN) 0.65 % SOLN nasal spray, Place 2 sprays into both nostrils 3 (three) times daily., Disp: 15 mL, Rfl: 0 .  traMADol (ULTRAM) 50 MG tablet, TAKE ONE TABLET BY MOUTH EVERY 6 HOURS AS NEEDED, Disp: 20 tablet, Rfl: 0   Allergies  Allergen Reactions  . Codeine Itching   Review of Systems   Pertinent items are noted in the HPI. Otherwise, ROS is negative.  Vitals:   Vitals:   10/04/17 0838  BP: 128/62  Pulse: 68  Temp: 97.6 F (36.4 C)  TempSrc: Oral  SpO2: 99%  Weight: 223 lb 6.4 oz (101.3 kg)  Height: 5' (1.524 m)     Body mass index is 43.63 kg/m.  Physical Exam:   Physical Exam    Constitutional: She is oriented to person, place, and time. She appears well-developed and well-nourished. No distress.  Comfortable in wheelchair.  HENT:  Head: Normocephalic and atraumatic.  Right Ear: External ear normal.  Left Ear: External ear normal.  Nose: Nose normal.  Mouth/Throat: Oropharynx is clear and moist.  Eyes: Pupils are equal, round, and reactive to light. Conjunctivae and EOM are normal.  Neck: Normal range of motion. Neck supple. No thyromegaly present.  Cardiovascular: Normal rate, regular rhythm and intact distal pulses.  Pulmonary/Chest: Effort normal.  2LNC.  Abdominal: Soft. Bowel sounds are normal.  Lymphadenopathy:    She has no cervical adenopathy.  Neurological: She is alert and oriented to person, place, and time.  Skin: Skin is warm and dry. Capillary refill takes less than 2 seconds.  Psychiatric: She has a normal mood and affect. Her behavior is normal.  Nursing note and vitals reviewed.  Diabetic Foot Exam - Simple   Simple Foot Form Diabetic Foot exam was performed with the following findings:  Yes 10/04/2017  9:57 AM  Visual Inspection No deformities, no ulcerations, no other skin breakdown bilaterally:  Yes Sensation Testing Intact to touch and monofilament testing bilaterally:  Yes Pulse Check Posterior Tibialis and Dorsalis pulse intact bilaterally:  Yes Comments     Assessment and Plan:   Seira was seen today for follow-up.  Diagnoses and all orders for this visit:  Diabetes mellitus type 2 in obese ([facility]) -     CBC with Differential/Platelet -     Comprehensive metabolic panel  Diabetic peripheral neuropathy (HCC) -     gabapentin (NEURONTIN) 300 MG capsule; Take 1 capsule (300 mg total) by mouth at bedtime.  Encounter for immunization -     Flu vaccine HIGH DOSE PF  Acute on chronic diastolic CHF (congestive heart failure) (HCC) -     potassium chloride (K-DUR,KLOR-CON) 10 MEQ tablet; Take 1 tablet (10 mEq total) by mouth  daily. -     furosemide (LASIX) 20 MG tablet; Take 1 tablet (20 mg total) by mouth 2 (two)  times daily. -     isosorbide mononitrate (IMDUR) 60 MG 24 hr tablet; Take 1 tablet (60 mg total) by mouth daily.  Anxiety Comments: Database reviewed.  Refill provided today. Orders: -     sertraline (ZOLOFT) 50 MG tablet; Take 1 tablet (50 mg total) by mouth at bedtime. -     ALPRAZolam (XANAX) 0.5 MG tablet; Take 1 tablet (0.5 mg total) by mouth 2 (two) times daily.  Depression, recurrent ([place]) -     sertraline (ZOLOFT) 50 MG tablet; Take 1 tablet (50 mg total) by mouth at bedtime.  Hyperlipidemia associated with type 2 diabetes mellitus (HCC) -     rosuvastatin (CRESTOR) 20 MG tablet; Take 1 tablet (20 mg total) by mouth every evening.  Hypertension associated with diabetes ([place]) -     losartan (COZAAR) 25 MG tablet; Take 1 tablet (25 mg total) by mouth daily. -     metoprolol tartrate (LOPRESSOR) 50 MG tablet; Take 1 tablet (50 mg total) by mouth daily.  Migraine without aura and without status migrainosus, not intractable -     rizatriptan (MAXALT) 10 MG tablet; TAKE ONE TABLET BY MOUTH DAILY AS NEEDED FOR CHRONIC PAIN  NSTEMI (non-ST elevated myocardial infarction) (HCC) -     aspirin 325 MG EC tablet; Take 1 tablet (325 mg total) by mouth daily. -     clopidogrel (PLAVIX) 75 MG tablet; Take 1 tablet (75 mg total) by mouth daily.  Seizure disorder (HCC) -     levETIRAcetam (KEPPRA) 1000 MG tablet; Take 1 tablet (1,000 mg total) by mouth 2 (two) times daily.   . Reviewed expectations re: course of current medical issues. . Discussed self-management of symptoms. . Outlined signs and symptoms indicating need for more acute intervention. . Patient verbalized understanding and all questions were answered. Marland Kitchen Health Maintenance issues including appropriate healthy diet, exercise, and smoking avoidance were discussed with patient. . See orders for this visit as documented in the electronic  medical record. . Patient received an After Visit Summary.  CMA served as Education administrator during this visit. History, Physical, and Plan performed by medical provider. The above documentation has been reviewed and is accurate and complete. Briscoe Deutscher, D.O.  Briscoe Deutscher, DO [place], [place] 10/04/2017

## 2017-10-05 DIAGNOSIS — I130 Hypertensive heart and chronic kidney disease with heart failure and stage 1 through stage 4 chronic kidney disease, or unspecified chronic kidney disease: Secondary | ICD-10-CM | POA: Diagnosis not present

## 2017-10-05 DIAGNOSIS — I214 Non-ST elevation (NSTEMI) myocardial infarction: Secondary | ICD-10-CM | POA: Diagnosis not present

## 2017-10-05 DIAGNOSIS — N183 Chronic kidney disease, stage 3 (moderate): Secondary | ICD-10-CM | POA: Diagnosis not present

## 2017-10-05 DIAGNOSIS — I2510 Atherosclerotic heart disease of native coronary artery without angina pectoris: Secondary | ICD-10-CM | POA: Diagnosis not present

## 2017-10-05 DIAGNOSIS — I5030 Unspecified diastolic (congestive) heart failure: Secondary | ICD-10-CM | POA: Diagnosis not present

## 2017-10-05 DIAGNOSIS — E1122 Type 2 diabetes mellitus with diabetic chronic kidney disease: Secondary | ICD-10-CM | POA: Diagnosis not present

## 2017-10-10 ENCOUNTER — Other Ambulatory Visit: Payer: Self-pay | Admitting: Family Medicine

## 2017-10-10 DIAGNOSIS — I214 Non-ST elevation (NSTEMI) myocardial infarction: Secondary | ICD-10-CM

## 2017-10-11 DIAGNOSIS — N183 Chronic kidney disease, stage 3 (moderate): Secondary | ICD-10-CM | POA: Diagnosis not present

## 2017-10-11 DIAGNOSIS — I5030 Unspecified diastolic (congestive) heart failure: Secondary | ICD-10-CM | POA: Diagnosis not present

## 2017-10-11 DIAGNOSIS — E1122 Type 2 diabetes mellitus with diabetic chronic kidney disease: Secondary | ICD-10-CM | POA: Diagnosis not present

## 2017-10-11 DIAGNOSIS — I214 Non-ST elevation (NSTEMI) myocardial infarction: Secondary | ICD-10-CM | POA: Diagnosis not present

## 2017-10-11 DIAGNOSIS — I130 Hypertensive heart and chronic kidney disease with heart failure and stage 1 through stage 4 chronic kidney disease, or unspecified chronic kidney disease: Secondary | ICD-10-CM | POA: Diagnosis not present

## 2017-10-11 DIAGNOSIS — I2510 Atherosclerotic heart disease of native coronary artery without angina pectoris: Secondary | ICD-10-CM | POA: Diagnosis not present

## 2017-10-13 ENCOUNTER — Other Ambulatory Visit: Payer: Self-pay

## 2017-10-13 ENCOUNTER — Telehealth: Payer: Self-pay | Admitting: Family Medicine

## 2017-10-13 DIAGNOSIS — I2510 Atherosclerotic heart disease of native coronary artery without angina pectoris: Secondary | ICD-10-CM | POA: Diagnosis not present

## 2017-10-13 DIAGNOSIS — E1122 Type 2 diabetes mellitus with diabetic chronic kidney disease: Secondary | ICD-10-CM | POA: Diagnosis not present

## 2017-10-13 DIAGNOSIS — I5030 Unspecified diastolic (congestive) heart failure: Secondary | ICD-10-CM | POA: Diagnosis not present

## 2017-10-13 DIAGNOSIS — I130 Hypertensive heart and chronic kidney disease with heart failure and stage 1 through stage 4 chronic kidney disease, or unspecified chronic kidney disease: Secondary | ICD-10-CM | POA: Diagnosis not present

## 2017-10-13 DIAGNOSIS — N183 Chronic kidney disease, stage 3 (moderate): Secondary | ICD-10-CM | POA: Diagnosis not present

## 2017-10-13 DIAGNOSIS — I214 Non-ST elevation (NSTEMI) myocardial infarction: Secondary | ICD-10-CM | POA: Diagnosis not present

## 2017-10-13 MED ORDER — COLLAGENASE 250 UNIT/GM EX OINT: 1.0000 "application " | TOPICAL_OINTMENT | Freq: Every day | CUTANEOUS | 0 refills | Status: DC

## 2017-10-13 NOTE — Telephone Encounter (Signed)
Copied from [place] [phone]. Topic: Quick Communication - See Telephone Encounter >> Oct 13, 2017  1:46 PM Gardiner Ramus wrote: CRM for notification. See Telephone encounter for: 10/13/17. Rachael calling from [facility] would like to change wound care orders. She states she will be going over today. Please advise [phone]

## 2017-10-13 NOTE — Telephone Encounter (Signed)
Called and spoke to [place]. Per wound care doctor the blister has opened up. They recommend that she have Santyl ointment applied daily to help with healing. I have called in to patient pharmacy and given verbal orders for change in dressing. She has follow up with our office next week.

## 2017-10-13 NOTE — Telephone Encounter (Signed)
Copied from [place] [phone]. Topic: Quick Communication - See Telephone Encounter >> Oct 13, 2017  3:29 PM Hewitt Shorts wrote: Kristopher Oppenheim calling to see if the dosage can be changed from 15grams to 30grams  for santyl ointment   Best number [phone]

## 2017-10-13 NOTE — Telephone Encounter (Signed)
See note

## 2017-10-14 MED ORDER — COLLAGENASE 250 UNIT/GM EX OINT: 1.0000 "application " | TOPICAL_OINTMENT | Freq: Every day | CUTANEOUS | 0 refills | Status: AC

## 2017-10-14 NOTE — Telephone Encounter (Signed)
New Rx sent to pharmacy for 30 grams, okay per Dr. Juleen China.

## 2017-10-18 DIAGNOSIS — I130 Hypertensive heart and chronic kidney disease with heart failure and stage 1 through stage 4 chronic kidney disease, or unspecified chronic kidney disease: Secondary | ICD-10-CM | POA: Diagnosis not present

## 2017-10-18 DIAGNOSIS — I2510 Atherosclerotic heart disease of native coronary artery without angina pectoris: Secondary | ICD-10-CM | POA: Diagnosis not present

## 2017-10-18 DIAGNOSIS — I214 Non-ST elevation (NSTEMI) myocardial infarction: Secondary | ICD-10-CM | POA: Diagnosis not present

## 2017-10-18 DIAGNOSIS — I5030 Unspecified diastolic (congestive) heart failure: Secondary | ICD-10-CM | POA: Diagnosis not present

## 2017-10-18 DIAGNOSIS — N183 Chronic kidney disease, stage 3 (moderate): Secondary | ICD-10-CM | POA: Diagnosis not present

## 2017-10-18 DIAGNOSIS — E1122 Type 2 diabetes mellitus with diabetic chronic kidney disease: Secondary | ICD-10-CM | POA: Diagnosis not present

## 2017-10-20 DIAGNOSIS — I130 Hypertensive heart and chronic kidney disease with heart failure and stage 1 through stage 4 chronic kidney disease, or unspecified chronic kidney disease: Secondary | ICD-10-CM | POA: Diagnosis not present

## 2017-10-20 DIAGNOSIS — I2510 Atherosclerotic heart disease of native coronary artery without angina pectoris: Secondary | ICD-10-CM | POA: Diagnosis not present

## 2017-10-20 DIAGNOSIS — I5030 Unspecified diastolic (congestive) heart failure: Secondary | ICD-10-CM | POA: Diagnosis not present

## 2017-10-20 DIAGNOSIS — I214 Non-ST elevation (NSTEMI) myocardial infarction: Secondary | ICD-10-CM | POA: Diagnosis not present

## 2017-10-20 DIAGNOSIS — N183 Chronic kidney disease, stage 3 (moderate): Secondary | ICD-10-CM | POA: Diagnosis not present

## 2017-10-20 DIAGNOSIS — E1122 Type 2 diabetes mellitus with diabetic chronic kidney disease: Secondary | ICD-10-CM | POA: Diagnosis not present

## 2017-10-26 DIAGNOSIS — I214 Non-ST elevation (NSTEMI) myocardial infarction: Secondary | ICD-10-CM | POA: Diagnosis not present

## 2017-10-26 DIAGNOSIS — E1122 Type 2 diabetes mellitus with diabetic chronic kidney disease: Secondary | ICD-10-CM | POA: Diagnosis not present

## 2017-10-26 DIAGNOSIS — N183 Chronic kidney disease, stage 3 (moderate): Secondary | ICD-10-CM | POA: Diagnosis not present

## 2017-10-26 DIAGNOSIS — I130 Hypertensive heart and chronic kidney disease with heart failure and stage 1 through stage 4 chronic kidney disease, or unspecified chronic kidney disease: Secondary | ICD-10-CM | POA: Diagnosis not present

## 2017-10-26 DIAGNOSIS — I5030 Unspecified diastolic (congestive) heart failure: Secondary | ICD-10-CM | POA: Diagnosis not present

## 2017-10-26 DIAGNOSIS — I2510 Atherosclerotic heart disease of native coronary artery without angina pectoris: Secondary | ICD-10-CM | POA: Diagnosis not present

## 2017-10-27 DIAGNOSIS — I2510 Atherosclerotic heart disease of native coronary artery without angina pectoris: Secondary | ICD-10-CM | POA: Diagnosis not present

## 2017-10-27 DIAGNOSIS — N183 Chronic kidney disease, stage 3 (moderate): Secondary | ICD-10-CM | POA: Diagnosis not present

## 2017-10-27 DIAGNOSIS — I5030 Unspecified diastolic (congestive) heart failure: Secondary | ICD-10-CM | POA: Diagnosis not present

## 2017-10-27 DIAGNOSIS — I130 Hypertensive heart and chronic kidney disease with heart failure and stage 1 through stage 4 chronic kidney disease, or unspecified chronic kidney disease: Secondary | ICD-10-CM | POA: Diagnosis not present

## 2017-10-27 DIAGNOSIS — I214 Non-ST elevation (NSTEMI) myocardial infarction: Secondary | ICD-10-CM | POA: Diagnosis not present

## 2017-10-27 DIAGNOSIS — E1122 Type 2 diabetes mellitus with diabetic chronic kidney disease: Secondary | ICD-10-CM | POA: Diagnosis not present

## 2017-10-28 DIAGNOSIS — Z9981 Dependence on supplemental oxygen: Secondary | ICD-10-CM

## 2017-10-28 DIAGNOSIS — I130 Hypertensive heart and chronic kidney disease with heart failure and stage 1 through stage 4 chronic kidney disease, or unspecified chronic kidney disease: Secondary | ICD-10-CM | POA: Diagnosis not present

## 2017-10-28 DIAGNOSIS — Z7982 Long term (current) use of aspirin: Secondary | ICD-10-CM | POA: Diagnosis not present

## 2017-10-28 DIAGNOSIS — D631 Anemia in chronic kidney disease: Secondary | ICD-10-CM | POA: Diagnosis not present

## 2017-10-28 DIAGNOSIS — K219 Gastro-esophageal reflux disease without esophagitis: Secondary | ICD-10-CM | POA: Diagnosis not present

## 2017-10-28 DIAGNOSIS — I5030 Unspecified diastolic (congestive) heart failure: Secondary | ICD-10-CM | POA: Diagnosis not present

## 2017-10-28 DIAGNOSIS — F419 Anxiety disorder, unspecified: Secondary | ICD-10-CM | POA: Diagnosis not present

## 2017-10-28 DIAGNOSIS — I214 Non-ST elevation (NSTEMI) myocardial infarction: Secondary | ICD-10-CM | POA: Diagnosis not present

## 2017-10-28 DIAGNOSIS — Z7902 Long term (current) use of antithrombotics/antiplatelets: Secondary | ICD-10-CM

## 2017-10-28 DIAGNOSIS — G40909 Epilepsy, unspecified, not intractable, without status epilepticus: Secondary | ICD-10-CM | POA: Diagnosis not present

## 2017-10-28 DIAGNOSIS — E1122 Type 2 diabetes mellitus with diabetic chronic kidney disease: Secondary | ICD-10-CM | POA: Diagnosis not present

## 2017-10-28 DIAGNOSIS — Z794 Long term (current) use of insulin: Secondary | ICD-10-CM

## 2017-10-28 DIAGNOSIS — F329 Major depressive disorder, single episode, unspecified: Secondary | ICD-10-CM | POA: Diagnosis not present

## 2017-10-28 DIAGNOSIS — N183 Chronic kidney disease, stage 3 (moderate): Secondary | ICD-10-CM | POA: Diagnosis not present

## 2017-10-30 NOTE — Progress Notes (Signed)
[name] is a 72 y.o. female is here for follow up.  History of Present Illness:   HPI: See Assessment and Plan section for Problem Based Charting of issues discussed today.   Health Maintenance Due  Topic Date Due  . OPHTHALMOLOGY EXAM  04/20/1955  . TETANUS/TDAP  04/19/1964  . DEXA SCAN  04/20/2010  . PNA vac Low Risk Adult (1 of 2 - PCV13) 04/20/2010   Depression screen PHQ 2/9 10/04/2017  Decreased Interest 0  Down, Depressed, Hopeless 0  PHQ - 2 Score 0  Altered sleeping 2  Tired, decreased energy 0  Change in appetite 0  Feeling bad or failure about yourself  0  Trouble concentrating 0  Moving slowly or fidgety/restless 0  Suicidal thoughts 0  PHQ-9 Score 2  Difficult doing work/chores Not difficult at all   PMHx, SurgHx, SocialHx, FamHx, Medications, and Allergies were reviewed in the Visit Navigator and updated as appropriate.   Patient Active Problem List   Diagnosis Date Noted  . Anxiety 11/02/2017  . Pressure injury of skin of right heel 11/02/2017  . Long term (current) use of aspirin 09/12/2017  . Mixed hyperlipidemia 09/12/2017  . Stage 3 chronic kidney disease ([place]) 09/12/2017  . Impaired mobility and ADLs 08/29/2017  . Cerebral thrombosis with cerebral infarction 07/27/2017  . Anemia of chronic disease 07/22/2017  . Congestive heart failure ([place]) 06/20/2017  . LV dysfunction 10/06/2016  . 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 Acute on chronic diastolic CHF (congestive heart failure) ([place]) 09/09/2016  . Migraine 08/20/2016  . Morbid obesity ([place]) 08/20/2016  . DOE (dyspnea on exertion), on O2 10/02/2015  . Hyperlipidemia associated with type 2 diabetes mellitus ([place]), on Crestor 10/02/2015  . Nonrheumatic aortic valve stenosis 10/02/2015  . Old MI (myocardial infarction) 10/02/2015  . Primary insomnia 11/03/2014  . Weakness generalized 08/28/2014  . Seizure disorder ([place]) 08/28/2014    . Coronary artery disease of native artery of native heart with stable angina pectoris ([place]) 06/02/2010  . Depression, recurrent ([place]), on Zoloft 05/20/2007  . Hypertension associated with diabetes ([place]) 05/20/2007  . Diaphragmatic hernia 05/20/2007  . Diverticulosis of colon 05/20/2007  . IBS 05/20/2007  . Fatty liver 05/20/2007  . Hypothyroidism 05/20/2007  . Hx of transient ischemic attack (TIA) 05/20/2007   Social History   Tobacco Use  . Smoking status: Former Smoker    Last attempt to quit: 06/02/1970    Years since quitting: 47.4  . Smokeless tobacco: Never Used  Substance Use Topics  . Alcohol use: No  . Drug use: No   Current Medications and Allergies:   Current Outpatient Medications:  .  acetaminophen (TYLENOL) 325 MG tablet, Take 2 tablets (650 mg total) by mouth every 4 (four) hours as needed for headache or mild pain., Disp: 30 tablet, Rfl: 0 .  ALPRAZolam (XANAX) 0.5 MG tablet, Take 1 tablet (0.5 mg total) by mouth at bedtime., Disp: 30 tablet, Rfl: 3 .  aspirin 325 MG EC tablet, Take 1 tablet (325 mg total) by mouth daily., Disp: 30 tablet, Rfl: 0 .  clopidogrel (PLAVIX) 75 MG tablet, Take 1 tablet (75 mg total) by mouth daily., Disp: 30 tablet, Rfl: 0 .  collagenase (SANTYL) ointment, Apply 1 application topically daily., Disp: 30 g, Rfl: 0 .  furosemide (LASIX) 20 MG tablet, Take 1 tablet (20 mg total) by mouth 2 (two) times daily., Disp: 30 tablet, Rfl: 0 .  gabapentin (  NEURONTIN) 300 MG capsule, Take 1 capsule (300 mg total) by mouth at bedtime., Disp: 90 capsule, Rfl: 0 .  glucose blood (ONETOUCH VERIO) test strip, 1 each by Other route daily. And lancets 1/day. Dx code E11.9., Disp: 100 each, Rfl: 3 .  insulin aspart (NOVOLOG) 100 UNIT/ML injection, Inject 0-9 Units into the skin 3 (three) times daily with meals. Sliding scale CBG 70 - 120: 0 units CBG 121 - 150: 1 unit,  CBG 151 - 200: 2 units,  CBG 201 - 250: 3 units,  CBG 251 - 300: 5 units,  CBG 301 - 350: 7  units,  CBG 351 - 400: 9 units   CBG > 400: 9 units and notify your MD, Disp: 10 mL, Rfl: 11 .  insulin detemir (LEVEMIR) 100 UNIT/ML injection, Inject 0.1 mLs (10 Units total) into the skin daily., Disp: 10 mL, Rfl: 0 .  isosorbide mononitrate (IMDUR) 60 MG 24 hr tablet, Take 1 tablet (60 mg total) by mouth daily., Disp: 30 tablet, Rfl: 0 .  levETIRAcetam (KEPPRA) 1000 MG tablet, Take 1 tablet (1,000 mg total) by mouth 2 (two) times daily., Disp: 60 tablet, Rfl: 0 .  loratadine (CLARITIN) 10 MG tablet, Take 1 tablet (10 mg total) by mouth daily as needed for allergies., Disp: 30 tablet, Rfl: 0 .  losartan (COZAAR) 25 MG tablet, Take 1 tablet (25 mg total) by mouth daily., Disp: 30 tablet, Rfl: 0 .  Melatonin 3 MG TABS, Take 1 tablet (3 mg total) by mouth at bedtime., Disp: 30 tablet, Rfl: 0 .  metoprolol tartrate (LOPRESSOR) 50 MG tablet, Take 1 tablet (50 mg total) by mouth daily., Disp: 30 tablet, Rfl: 0 .  ondansetron (ZOFRAN) 4 MG tablet, Take 1 tablet (4 mg total) by mouth every 6 (six) hours as needed for nausea or vomiting., Disp: 20 tablet, Rfl: 0 .  oxymetazoline (AFRIN) 0.05 % nasal spray, Place 1 spray into both nostrils 2 (two) times daily as needed (nosebleeds)., Disp: 30 mL, Rfl: 0 .  polyethylene glycol (MIRALAX / GLYCOLAX) packet, Take 17 g by mouth daily as needed for mild constipation., Disp: 14 each, Rfl: 0 .  potassium chloride (K-DUR,KLOR-CON) 10 MEQ tablet, Take 1 tablet (10 mEq total) by mouth daily., Disp: 30 tablet, Rfl: 0 .  rizatriptan (MAXALT) 10 MG tablet, TAKE ONE TABLET BY MOUTH DAILY AS NEEDED FOR CHRONIC PAIN, Disp: 10 tablet, Rfl: 0 .  rosuvastatin (CRESTOR) 20 MG tablet, Take 1 tablet (20 mg total) by mouth every evening., Disp: 30 tablet, Rfl: 0 .  sertraline (ZOLOFT) 50 MG tablet, Take 1 tablet (50 mg total) by mouth at bedtime., Disp: 90 tablet, Rfl: 3 .  sodium chloride (OCEAN) 0.65 % SOLN nasal spray, Place 2 sprays into both nostrils 3 (three) times daily.,  Disp: 15 mL, Rfl: 0 .  traMADol (ULTRAM) 50 MG tablet, Take 1 tablet (50 mg total) by mouth every 6 (six) hours as needed., Disp: 30 tablet, Rfl: 0 .  albuterol (PROVENTIL HFA;VENTOLIN HFA) 108 (90 Base) MCG/ACT inhaler, Inhale 2 puffs into the lungs every 6 (six) hours as needed for wheezing or shortness of breath., Disp: 1 Inhaler, Rfl: 3   Allergies  Allergen Reactions  . Codeine Itching   Review of Systems   Pertinent items are noted in the HPI. Otherwise, ROS is negative.  Vitals:   Vitals:   10/31/17 0949  BP: 116/62  Pulse: 75  Temp: 97.7 F (36.5 C)  TempSrc: Oral  SpO2: 100%  Weight: 219 lb 6.4 oz (99.5 kg)  Height: 5' (1.524 m)     Body mass index is 42.85 kg/m.  Physical Exam:   Physical Exam  Constitutional: She is oriented to person, place, and time. She appears well-developed and well-nourished. No distress.  Comfortable in wheelchair.  HENT:  Head: Normocephalic and atraumatic.  Right Ear: External ear normal.  Left Ear: External ear normal.  Nose: Nose normal.  Mouth/Throat: Oropharynx is clear and moist.  Eyes: Pupils are equal, round, and reactive to light. Conjunctivae and EOM are normal.  Neck: Normal range of motion. Neck supple. No thyromegaly present.  Cardiovascular: Normal rate, regular rhythm and intact distal pulses.  Pulmonary/Chest: Effort normal.  2LNC.  Abdominal: Soft. Bowel sounds are normal.  Lymphadenopathy:    She has no cervical adenopathy.  Neurological: She is alert and oriented to person, place, and time.  Skin: Skin is warm and dry. Capillary refill takes less than 2 seconds.  Right heel with quarter sized area ulceration with overlying callous. No streaking or erythema.   Psychiatric: She has a normal mood and affect. Her behavior is normal.  Nursing note and vitals reviewed.  Results for orders placed or performed in visit on 10/31/17  CBC with Differential/Platelet  Result Value Ref Range   WBC 7.1 4.0 - 10.5 K/uL    RBC 3.11 (L) 3.87 - 5.11 Mil/uL   Hemoglobin 10.0 (L) 12.0 - 15.0 g/dL   HCT 30.3 (L) 36.0 - 46.0 %   MCV 97.4 78.0 - 100.0 fl   MCHC 33.0 30.0 - 36.0 g/dL   RDW 16.8 (H) 11.5 - 15.5 %   Platelets 250.0 150.0 - 400.0 K/uL   Neutrophils Relative % 73.7 43.0 - 77.0 %   Lymphocytes Relative 18.4 12.0 - 46.0 %   Monocytes Relative 5.5 3.0 - 12.0 %   Eosinophils Relative 2.1 0.0 - 5.0 %   Basophils Relative 0.3 0.0 - 3.0 %   Neutro Abs 5.2 1.4 - 7.7 K/uL   Lymphs Abs 1.3 0.7 - 4.0 K/uL   Monocytes Absolute 0.4 0.1 - 1.0 K/uL   Eosinophils Absolute 0.1 0.0 - 0.7 K/uL   Basophils Absolute 0.0 0.0 - 0.1 K/uL  Comprehensive metabolic panel  Result Value Ref Range   Sodium 140 135 - 145 mEq/L   Potassium 4.3 3.5 - 5.1 mEq/L   Chloride 107 96 - 112 mEq/L   CO2 25 19 - 32 mEq/L   Glucose, Bld 187 (H) 70 - 99 mg/dL   BUN 38 (H) 6 - 23 mg/dL   Creatinine, Ser 1.18 0.40 - 1.20 mg/dL   Total Bilirubin 0.3 0.2 - 1.2 mg/dL   Alkaline Phosphatase 83 39 - 117 U/L   AST 13 0 - 37 U/L   ALT 8 0 - 35 U/L   Total Protein 6.7 6.0 - 8.3 g/dL   Albumin 3.7 3.5 - 5.2 g/dL   Calcium 8.8 8.4 - 10.5 mg/dL   GFR 47.78 (L) >60.00 mL/min  Hemoglobin A1c  Result Value Ref Range   Hgb A1c MFr Bld 7.6 (H) 4.6 - 6.5 %    Assessment and Plan:   Seizure disorder (HCC) Patient would like to wean off of Keppra.  She has been on a stable dose for the past 3 years.  She denies any seizures within that time period.  After discussion, I convinced her to hold on weaning that medication.  She will discuss this with her neurologist and make a plan  if appropriate.  Nonrheumatic aortic valve stenosis Patient with loud murmur.  She denies overt chest pain.  She does have significant shortness of breath with exertion.  She has been evaluated by cardiology and deemed to be a poor candidate for any surgery.  Morbid obesity ([place]) We discussed healthy food choices to aid in this medical problem.  Depression, recurrent  ([place]), on Zoloft Compliant with Zoloft.  She does feel that this is still helpful.  Maintain current dose.  Weakness generalized Worsening deconditioning and current state.  Offered physical therapy.  Stage 3 chronic kidney disease (HCC) Lab Results  Component Value Date   CREATININE 1.18 10/31/2017   CREATININE 1.18 10/04/2017   CREATININE 0.98 08/25/2017   Estimated Creatinine Clearance: 45.6 mL/min (by C-G formula based on SCr of 1.18 mg/dL).  Assessment: CKD: stage 3 - GFR 30-59 stable. Plan: glycemic control, blood pressure control, foot care, avoid NSAIDs and consult physician early if nausea, vomiting, or diarrhea.   Anemia of chronic disease CBC Latest Ref Rng & Units 10/31/2017 10/04/2017 08/25/2017  WBC 4.0 - 10.5 K/uL 7.1 6.8 4.7  Hemoglobin 12.0 - 15.0 g/dL 10.0(L) 9.7(L) 9.4(L)  Hematocrit 36.0 - 46.0 % 30.3(L) 29.2(L) 28.2(L)  Platelets 150.0 - 400.0 K/uL 250.0 277.0 220   Lab Results  Component Value Date   IRON 47 07/22/2017   TIBC 253 07/22/2017   FERRITIN 43 07/22/2017   Lab Results  Component Value Date   [id number] 700 07/22/2017   Labs are stable. Will monitor.   Anxiety With prn Xanax. Interested in weaning, which is great. Down to one daily. Will continue to work with her on this.  Hypertension associated with diabetes ([place]) Review: taking medications as instructed, no medication side effects noted, no TIAs, no chest pain on exertion, no dyspnea on exertion, no swelling of ankles. Smoker: No.  Wt Readings from Last 3 Encounters:  10/31/17 219 lb 6.4 oz (99.5 kg)  10/04/17 223 lb 6.4 oz (101.3 kg)  08/25/17 259 lb 12.8 oz (117.8 kg)   BP Readings from Last 3 Encounters:  10/31/17 116/62  10/04/17 128/62  08/26/17 (!) 142/74   Lab Results  Component Value Date   CREATININE 1.18 10/31/2017    Plan: Dietary sodium restriction. Check blood pressures daily and record..   Pressure injury of skin of right heel Redressed wound today. Will  order home health for wound care. Discussed offloading the area.   Orders Placed This Encounter  Procedures  . CBC with Differential/Platelet  . Comprehensive metabolic panel  . Hemoglobin A1c    Meds ordered this encounter  Medications  . ALPRAZolam (XANAX) 0.5 MG tablet    Sig: Take 1 tablet (0.5 mg total) by mouth at bedtime.    Dispense:  30 tablet    Refill:  3  . traMADol (ULTRAM) 50 MG tablet    Sig: Take 1 tablet (50 mg total) by mouth every 6 (six) hours as needed.    Dispense:  30 tablet    Refill:  0  . albuterol (PROVENTIL HFA;VENTOLIN HFA) 108 (90 Base) MCG/ACT inhaler    Sig: Inhale 2 puffs into the lungs every 6 (six) hours as needed for wheezing or shortness of breath.    Dispense:  1 Inhaler    Refill:  3  . sertraline (ZOLOFT) 50 MG tablet    Sig: Take 1 tablet (50 mg total) by mouth at bedtime.    Dispense:  90 tablet    Refill:  3  .  gabapentin (NEURONTIN) 300 MG capsule    Sig: Take 1 capsule (300 mg total) by mouth at bedtime.    Dispense:  90 capsule    Refill:  0    . Reviewed expectations re: course of current medical issues. . Discussed self-management of symptoms. . Outlined signs and symptoms indicating need for more acute intervention. . Patient verbalized understanding and all questions were answered. Marland Kitchen Health Maintenance issues including appropriate healthy diet, exercise, and smoking avoidance were discussed with patient. . See orders for this visit as documented in the electronic medical record. . Patient received an After Visit Summary.  Briscoe Deutscher, DO [place], [place] 11/02/2017

## 2017-10-31 ENCOUNTER — Ambulatory Visit (INDEPENDENT_AMBULATORY_CARE_PROVIDER_SITE_OTHER): Payer: Medicare Other | Admitting: Family Medicine

## 2017-10-31 ENCOUNTER — Encounter: Payer: Self-pay | Admitting: Family Medicine

## 2017-10-31 ENCOUNTER — Telehealth: Payer: Self-pay | Admitting: Family Medicine

## 2017-10-31 VITALS — BP 116/62 | HR 75 | Temp 97.7°F | Ht 60.0 in | Wt 219.4 lb

## 2017-10-31 DIAGNOSIS — E1142 Type 2 diabetes mellitus with diabetic polyneuropathy: Secondary | ICD-10-CM

## 2017-10-31 DIAGNOSIS — G40909 Epilepsy, unspecified, not intractable, without status epilepticus: Secondary | ICD-10-CM

## 2017-10-31 DIAGNOSIS — E1159 Type 2 diabetes mellitus with other circulatory complications: Secondary | ICD-10-CM

## 2017-10-31 DIAGNOSIS — E639 Nutritional deficiency, unspecified: Secondary | ICD-10-CM

## 2017-10-31 DIAGNOSIS — F339 Major depressive disorder, recurrent, unspecified: Secondary | ICD-10-CM

## 2017-10-31 DIAGNOSIS — E1122 Type 2 diabetes mellitus with diabetic chronic kidney disease: Secondary | ICD-10-CM

## 2017-10-31 DIAGNOSIS — L89619 Pressure ulcer of right heel, unspecified stage: Secondary | ICD-10-CM

## 2017-10-31 DIAGNOSIS — I350 Nonrheumatic aortic (valve) stenosis: Secondary | ICD-10-CM

## 2017-10-31 DIAGNOSIS — D638 Anemia in other chronic diseases classified elsewhere: Secondary | ICD-10-CM

## 2017-10-31 DIAGNOSIS — N183 Chronic kidney disease, stage 3 unspecified: Secondary | ICD-10-CM

## 2017-10-31 DIAGNOSIS — R531 Weakness: Secondary | ICD-10-CM

## 2017-10-31 DIAGNOSIS — F419 Anxiety disorder, unspecified: Secondary | ICD-10-CM | POA: Diagnosis not present

## 2017-10-31 DIAGNOSIS — G894 Chronic pain syndrome: Secondary | ICD-10-CM

## 2017-10-31 DIAGNOSIS — Z794 Long term (current) use of insulin: Secondary | ICD-10-CM

## 2017-10-31 DIAGNOSIS — I6330 Cerebral infarction due to thrombosis of unspecified cerebral artery: Secondary | ICD-10-CM

## 2017-10-31 DIAGNOSIS — I152 Hypertension secondary to endocrine disorders: Secondary | ICD-10-CM

## 2017-10-31 DIAGNOSIS — I10 Essential (primary) hypertension: Secondary | ICD-10-CM

## 2017-10-31 LAB — CBC WITH DIFFERENTIAL/PLATELET
Basophils Absolute: 0 10*3/uL (ref 0.0–0.1)
Basophils Relative: 0.3 % (ref 0.0–3.0)
Eosinophils Absolute: 0.1 10*3/uL (ref 0.0–0.7)
Eosinophils Relative: 2.1 % (ref 0.0–5.0)
HCT: 30.3 % — ABNORMAL LOW (ref 36.0–46.0)
Hemoglobin: 10 g/dL — ABNORMAL LOW (ref 12.0–15.0)
Lymphocytes Relative: 18.4 % (ref 12.0–46.0)
Lymphs Abs: 1.3 10*3/uL (ref 0.7–4.0)
MCHC: 33 g/dL (ref 30.0–36.0)
MCV: 97.4 fl (ref 78.0–100.0)
Monocytes Absolute: 0.4 10*3/uL (ref 0.1–1.0)
Monocytes Relative: 5.5 % (ref 3.0–12.0)
Neutro Abs: 5.2 10*3/uL (ref 1.4–7.7)
Neutrophils Relative %: 73.7 % (ref 43.0–77.0)
Platelets: 250 10*3/uL (ref 150.0–400.0)
RBC: 3.11 Mil/uL — ABNORMAL LOW (ref 3.87–5.11)
RDW: 16.8 % — ABNORMAL HIGH (ref 11.5–15.5)
WBC: 7.1 10*3/uL (ref 4.0–10.5)

## 2017-10-31 LAB — COMPREHENSIVE METABOLIC PANEL
ALT: 8 U/L (ref 0–35)
AST: 13 U/L (ref 0–37)
Albumin: 3.7 g/dL (ref 3.5–5.2)
Alkaline Phosphatase: 83 U/L (ref 39–117)
BUN: 38 mg/dL — ABNORMAL HIGH (ref 6–23)
CO2: 25 mEq/L (ref 19–32)
Calcium: 8.8 mg/dL (ref 8.4–10.5)
Chloride: 107 mEq/L (ref 96–112)
Creatinine, Ser: 1.18 mg/dL (ref 0.40–1.20)
GFR: 47.78 mL/min — ABNORMAL LOW (ref >60.00)
Glucose, Bld: 187 mg/dL — ABNORMAL HIGH (ref 70–99)
Potassium: 4.3 mEq/L (ref 3.5–5.1)
Sodium: 140 mEq/L (ref 135–145)
Total Bilirubin: 0.3 mg/dL (ref 0.2–1.2)
Total Protein: 6.7 g/dL (ref 6.0–8.3)

## 2017-10-31 LAB — HEMOGLOBIN A1C: Hgb A1c MFr Bld: 7.6 % — ABNORMAL HIGH (ref 4.6–6.5)

## 2017-10-31 MED ORDER — ALPRAZOLAM 0.5 MG PO TABS: 0.5000 mg | ORAL_TABLET | Freq: Every day | ORAL | 3 refills | Status: AC

## 2017-10-31 MED ORDER — SERTRALINE HCL 50 MG PO TABS: 50.0000 mg | ORAL_TABLET | Freq: Every day | ORAL | 3 refills | Status: AC

## 2017-10-31 MED ORDER — TRAMADOL HCL 50 MG PO TABS: 50.0000 mg | ORAL_TABLET | Freq: Four times a day (QID) | ORAL | 0 refills | Status: DC | PRN

## 2017-10-31 MED ORDER — GABAPENTIN 300 MG PO CAPS: 300.0000 mg | ORAL_CAPSULE | Freq: Every day | ORAL | 0 refills | Status: DC

## 2017-10-31 MED ORDER — ALBUTEROL SULFATE HFA 108 (90 BASE) MCG/ACT IN AERS: 2.0000 | INHALATION_SPRAY | Freq: Four times a day (QID) | RESPIRATORY_TRACT | 3 refills | Status: AC | PRN

## 2017-10-31 NOTE — Telephone Encounter (Signed)
See note

## 2017-10-31 NOTE — Telephone Encounter (Signed)
Copied from [place] [id number]. Topic: Quick Communication - See Telephone Encounter >> Oct 31, 2017  4:45 PM Bea Graff, NT wrote: CRM for notification. See Telephone encounter for: 10/31/17. Sky with [facility] calling to be sure it is ok to fill both the tramadol and xanax for this pt. Please advise.

## 2017-11-01 NOTE — Telephone Encounter (Signed)
Sent on 10/31/17 after visit. If did not go through, okay to fill.

## 2017-11-01 NOTE — Telephone Encounter (Signed)
Called l/m on voicemail that it was ok to fill with pharmacy voicemail.

## 2017-11-01 NOTE — Telephone Encounter (Signed)
Pleased advise ok to refill?

## 2017-11-02 ENCOUNTER — Other Ambulatory Visit: Payer: Self-pay

## 2017-11-02 ENCOUNTER — Telehealth: Payer: Self-pay | Admitting: Family Medicine

## 2017-11-02 DIAGNOSIS — L89619 Pressure ulcer of right heel, unspecified stage: Secondary | ICD-10-CM

## 2017-11-02 DIAGNOSIS — F419 Anxiety disorder, unspecified: Secondary | ICD-10-CM | POA: Insufficient documentation

## 2017-11-02 NOTE — Assessment & Plan Note (Signed)
Compliant with Zoloft.  She does feel that this is still helpful.  Maintain current dose.

## 2017-11-02 NOTE — Assessment & Plan Note (Signed)
With prn Xanax. Interested in weaning, which is great. Down to one daily. Will continue to work with her on this.

## 2017-11-02 NOTE — Assessment & Plan Note (Signed)
Redressed wound today. Will order home health for wound care. Discussed offloading the area.

## 2017-11-02 NOTE — Assessment & Plan Note (Signed)
Worsening deconditioning and current state.  Offered physical therapy.

## 2017-11-02 NOTE — Assessment & Plan Note (Signed)
Patient with loud murmur.  She denies overt chest pain.  She does have significant shortness of breath with exertion.  She has been evaluated by cardiology and deemed to be a poor candidate for any surgery.

## 2017-11-02 NOTE — Telephone Encounter (Signed)
See note  Copied from [place] [id number]. Topic: Referral - Request for Referral >> Nov 02, 2017 11:52 AM Ahmed Prima L wrote: Has patient seen PCP for this complaint? Yes *If NO, is insurance requiring patient see PCP for this issue before PCP can refer them? No Referral for which specialty: [facility]  Preferred provider/office:  Reason for referral: Wound on Right Foot, patient seen Dr Juleen China on Monday and she advised her she would place the referral. Please advise.

## 2017-11-02 NOTE — Assessment & Plan Note (Signed)
We discussed healthy food choices to aid in this medical problem.

## 2017-11-02 NOTE — Assessment & Plan Note (Signed)
Patient would like to wean off of Keppra.  She has been on a stable dose for the past 3 years.  She denies any seizures within that time period.  After discussion, I convinced her to hold on weaning that medication.  She will discuss this with her neurologist and make a plan if appropriate.

## 2017-11-02 NOTE — Telephone Encounter (Signed)
Ok to place order 

## 2017-11-02 NOTE — Assessment & Plan Note (Signed)
Lab Results  Component Value Date   CREATININE 1.18 10/31/2017   CREATININE 1.18 10/04/2017   CREATININE 0.98 08/25/2017   Estimated Creatinine Clearance: 45.6 mL/min (by C-G formula based on SCr of 1.18 mg/dL).  Assessment: CKD: stage 3 - GFR 30-59 stable. Plan: glycemic control, blood pressure control, foot care, avoid NSAIDs and consult physician early if nausea, vomiting, or diarrhea.

## 2017-11-02 NOTE — Assessment & Plan Note (Signed)
CBC Latest Ref Rng & Units 10/31/2017 10/04/2017 08/25/2017  WBC 4.0 - 10.5 K/uL 7.1 6.8 4.7  Hemoglobin 12.0 - 15.0 g/dL 10.0(L) 9.7(L) 9.4(L)  Hematocrit 36.0 - 46.0 % 30.3(L) 29.2(L) 28.2(L)  Platelets 150.0 - 400.0 K/uL 250.0 277.0 220   Lab Results  Component Value Date   IRON 47 07/22/2017   TIBC 253 07/22/2017   FERRITIN 43 07/22/2017   Lab Results  Component Value Date   [id number] 102 07/22/2017   Labs are stable. Will monitor.

## 2017-11-02 NOTE — Telephone Encounter (Signed)
Wound care referral please - best if done at her home.

## 2017-11-02 NOTE — Assessment & Plan Note (Signed)
Review: taking medications as instructed, no medication side effects noted, no TIAs, no chest pain on exertion, no dyspnea on exertion, no swelling of ankles. Smoker: No.  Wt Readings from Last 3 Encounters:  10/31/17 219 lb 6.4 oz (99.5 kg)  10/04/17 223 lb 6.4 oz (101.3 kg)  08/25/17 259 lb 12.8 oz (117.8 kg)   BP Readings from Last 3 Encounters:  10/31/17 116/62  10/04/17 128/62  08/26/17 (!) 142/74   Lab Results  Component Value Date   CREATININE 1.18 10/31/2017    Plan: Dietary sodium restriction. Check blood pressures daily and record.Marland Kitchen

## 2017-11-02 NOTE — Telephone Encounter (Signed)
Order placed

## 2017-11-05 DIAGNOSIS — L89610 Pressure ulcer of right heel, unstageable: Secondary | ICD-10-CM | POA: Diagnosis not present

## 2017-11-05 DIAGNOSIS — E1140 Type 2 diabetes mellitus with diabetic neuropathy, unspecified: Secondary | ICD-10-CM | POA: Diagnosis not present

## 2017-11-05 DIAGNOSIS — E1122 Type 2 diabetes mellitus with diabetic chronic kidney disease: Secondary | ICD-10-CM | POA: Diagnosis not present

## 2017-11-05 DIAGNOSIS — N183 Chronic kidney disease, stage 3 (moderate): Secondary | ICD-10-CM | POA: Diagnosis not present

## 2017-11-05 DIAGNOSIS — I2510 Atherosclerotic heart disease of native coronary artery without angina pectoris: Secondary | ICD-10-CM | POA: Diagnosis not present

## 2017-11-05 DIAGNOSIS — I5032 Chronic diastolic (congestive) heart failure: Secondary | ICD-10-CM | POA: Diagnosis not present

## 2017-11-06 ENCOUNTER — Other Ambulatory Visit: Payer: Self-pay | Admitting: Family Medicine

## 2017-11-06 DIAGNOSIS — I214 Non-ST elevation (NSTEMI) myocardial infarction: Secondary | ICD-10-CM

## 2017-11-06 DIAGNOSIS — I5033 Acute on chronic diastolic (congestive) heart failure: Secondary | ICD-10-CM

## 2017-11-07 ENCOUNTER — Other Ambulatory Visit: Payer: Self-pay | Admitting: Family Medicine

## 2017-11-07 DIAGNOSIS — G40909 Epilepsy, unspecified, not intractable, without status epilepticus: Secondary | ICD-10-CM

## 2017-11-07 NOTE — Telephone Encounter (Signed)
Please advise on refill. I do not show appointment scheduled with Neurology.

## 2017-11-07 NOTE — Telephone Encounter (Signed)
OK to fill again?

## 2017-11-09 ENCOUNTER — Telehealth: Payer: Self-pay | Admitting: Family Medicine

## 2017-11-09 NOTE — Telephone Encounter (Signed)
See note

## 2017-11-09 NOTE — Telephone Encounter (Signed)
Copied from [place] [id number]. Topic: Quick Communication - See Telephone Encounter >> Nov 09, 2017 10:15 AM Ahmed Prima L wrote: CRM for notification. See Telephone encounter for: 11/09/17. Caryl Pina, RN with [facility] called and stated that she was suppose to go out and see her today for her first visit. The patient told her that she had a fall in the middle of the night. The patient told her she did not want her to come out because she has a headache, 5 out of 10. She did not hit her head but did not feel up for visitors. She will be going out on Friday to see her. She did have to repeat things to her several times. She tried to contact her daughter but could not get her. She wants someone to try to touch base with her. Please advise. [phone]

## 2017-11-09 NOTE — Telephone Encounter (Signed)
Called Patient to check on her. She states that she did not hit her head at all and she is just having  "normal" migraine. She does not have any new or changed symptoms. She did not want app for evaluation states she does not have any pain from the fall. She will call if any changes or issues.

## 2017-11-11 DIAGNOSIS — I5032 Chronic diastolic (congestive) heart failure: Secondary | ICD-10-CM | POA: Diagnosis not present

## 2017-11-11 DIAGNOSIS — I2510 Atherosclerotic heart disease of native coronary artery without angina pectoris: Secondary | ICD-10-CM | POA: Diagnosis not present

## 2017-11-11 DIAGNOSIS — N183 Chronic kidney disease, stage 3 (moderate): Secondary | ICD-10-CM | POA: Diagnosis not present

## 2017-11-11 DIAGNOSIS — E1122 Type 2 diabetes mellitus with diabetic chronic kidney disease: Secondary | ICD-10-CM | POA: Diagnosis not present

## 2017-11-11 DIAGNOSIS — L89610 Pressure ulcer of right heel, unstageable: Secondary | ICD-10-CM | POA: Diagnosis not present

## 2017-11-11 DIAGNOSIS — E1140 Type 2 diabetes mellitus with diabetic neuropathy, unspecified: Secondary | ICD-10-CM | POA: Diagnosis not present

## 2017-11-13 ENCOUNTER — Other Ambulatory Visit: Payer: Self-pay | Admitting: Family Medicine

## 2017-11-13 DIAGNOSIS — I152 Hypertension secondary to endocrine disorders: Secondary | ICD-10-CM

## 2017-11-13 DIAGNOSIS — I10 Essential (primary) hypertension: Principal | ICD-10-CM

## 2017-11-13 DIAGNOSIS — E1159 Type 2 diabetes mellitus with other circulatory complications: Secondary | ICD-10-CM

## 2017-11-18 DIAGNOSIS — E1122 Type 2 diabetes mellitus with diabetic chronic kidney disease: Secondary | ICD-10-CM | POA: Diagnosis not present

## 2017-11-18 DIAGNOSIS — N183 Chronic kidney disease, stage 3 (moderate): Secondary | ICD-10-CM | POA: Diagnosis not present

## 2017-11-18 DIAGNOSIS — I5032 Chronic diastolic (congestive) heart failure: Secondary | ICD-10-CM | POA: Diagnosis not present

## 2017-11-18 DIAGNOSIS — E1140 Type 2 diabetes mellitus with diabetic neuropathy, unspecified: Secondary | ICD-10-CM | POA: Diagnosis not present

## 2017-11-18 DIAGNOSIS — I2510 Atherosclerotic heart disease of native coronary artery without angina pectoris: Secondary | ICD-10-CM | POA: Diagnosis not present

## 2017-11-18 DIAGNOSIS — L89610 Pressure ulcer of right heel, unstageable: Secondary | ICD-10-CM | POA: Diagnosis not present

## 2017-11-24 DIAGNOSIS — I2510 Atherosclerotic heart disease of native coronary artery without angina pectoris: Secondary | ICD-10-CM | POA: Diagnosis not present

## 2017-11-24 DIAGNOSIS — E1122 Type 2 diabetes mellitus with diabetic chronic kidney disease: Secondary | ICD-10-CM | POA: Diagnosis not present

## 2017-11-24 DIAGNOSIS — N183 Chronic kidney disease, stage 3 (moderate): Secondary | ICD-10-CM | POA: Diagnosis not present

## 2017-11-24 DIAGNOSIS — E1140 Type 2 diabetes mellitus with diabetic neuropathy, unspecified: Secondary | ICD-10-CM | POA: Diagnosis not present

## 2017-11-24 DIAGNOSIS — I5032 Chronic diastolic (congestive) heart failure: Secondary | ICD-10-CM | POA: Diagnosis not present

## 2017-11-24 DIAGNOSIS — L89610 Pressure ulcer of right heel, unstageable: Secondary | ICD-10-CM | POA: Diagnosis not present

## 2017-11-29 DIAGNOSIS — I2510 Atherosclerotic heart disease of native coronary artery without angina pectoris: Secondary | ICD-10-CM | POA: Diagnosis not present

## 2017-11-29 DIAGNOSIS — E1122 Type 2 diabetes mellitus with diabetic chronic kidney disease: Secondary | ICD-10-CM | POA: Diagnosis not present

## 2017-11-29 DIAGNOSIS — N183 Chronic kidney disease, stage 3 (moderate): Secondary | ICD-10-CM | POA: Diagnosis not present

## 2017-11-29 DIAGNOSIS — I5032 Chronic diastolic (congestive) heart failure: Secondary | ICD-10-CM | POA: Diagnosis not present

## 2017-11-29 DIAGNOSIS — L89610 Pressure ulcer of right heel, unstageable: Secondary | ICD-10-CM | POA: Diagnosis not present

## 2017-11-29 DIAGNOSIS — E1140 Type 2 diabetes mellitus with diabetic neuropathy, unspecified: Secondary | ICD-10-CM | POA: Diagnosis not present

## 2017-11-30 ENCOUNTER — Telehealth: Payer: Self-pay | Admitting: Family Medicine

## 2017-11-30 ENCOUNTER — Ambulatory Visit: Payer: Self-pay | Admitting: *Deleted

## 2017-11-30 DIAGNOSIS — E1122 Type 2 diabetes mellitus with diabetic chronic kidney disease: Secondary | ICD-10-CM | POA: Diagnosis not present

## 2017-11-30 DIAGNOSIS — I5032 Chronic diastolic (congestive) heart failure: Secondary | ICD-10-CM | POA: Diagnosis not present

## 2017-11-30 DIAGNOSIS — E1140 Type 2 diabetes mellitus with diabetic neuropathy, unspecified: Secondary | ICD-10-CM | POA: Diagnosis not present

## 2017-11-30 DIAGNOSIS — L89610 Pressure ulcer of right heel, unstageable: Secondary | ICD-10-CM | POA: Diagnosis not present

## 2017-11-30 DIAGNOSIS — I2510 Atherosclerotic heart disease of native coronary artery without angina pectoris: Secondary | ICD-10-CM | POA: Diagnosis not present

## 2017-11-30 DIAGNOSIS — N183 Chronic kidney disease, stage 3 (moderate): Secondary | ICD-10-CM | POA: Diagnosis not present

## 2017-11-30 NOTE — Telephone Encounter (Signed)
Called and spoke to patients husband after patient did not answer phone. He states that patient is staying with their daughter. I attempted to call [name] but went to voicemail. Left voicemail requesting call back. I then called patients husband back and explained what the home health nurse called in about. I explained that these symptoms are concern for infection and patient should go to the ED. [name] stated understanding and will attempt to get a hold of daughter or patient. If patient calls back and [facility] is still open, please triage patient.

## 2017-11-30 NOTE — Telephone Encounter (Signed)
Copied from [place] [id number]. Topic: General - Other >> Nov 30, 2017  4:45 PM Valla Leaver wrote: Reason for CRM: [facility] calling to notify Dr . Juleen China that the patients wound has redness, purulent drainage and odor.

## 2017-11-30 NOTE — Telephone Encounter (Signed)
Started triage, daughter informed TN she was not with patient but on the way to pt's home. Instructed to CB when with patient to assess wound, continue triage.

## 2017-11-30 NOTE — Telephone Encounter (Signed)
Pt's daughter removed dressing as instructed during triage call. '[name]' states no odor. Denies any redness, swelling, tenderness. States there is a small amount of yellowish drainage "After the scab came off, the other nurse said that was normal." States she is displeased with agency as "There is a different nurse every day and  dressing is on tight." Instructed daughter to take pt to ED if fever develops, redness surrounding wound, increased drainage,odor to drainage, pain, increased tenderness, swelling, warmth. Verbalizes understanding.   Reason for Disposition . Nursing judgment or information in reference  Answer Assessment - Initial Assessment Questions 1. REASON FOR CALL: "What is your main concern right now?"     *No Answer* 2. ONSET: "When did the *No Answer* start?"     *No Answer* 3. SEVERITY: "How bad is the *No Answer*?"     *No Answer* 4. FEVER: "Do you have a fever?"     *No Answer* 5. OTHER SYMPTOMS: "Do you have any other new symptoms?"     *No Answer* 6. INTERVENTIONS AND RESPONSE: "What have you done so far to try to make this better? What medications have you used?"     *No Answer* 7. PREGNANCY: "Is there any chance you are pregnant?"     *No Answer*  Protocols used: NO GUIDELINE AVAILABLE-A-AH

## 2017-12-05 NOTE — Telephone Encounter (Signed)
Patient called back see note from triage.

## 2017-12-06 DIAGNOSIS — L89610 Pressure ulcer of right heel, unstageable: Secondary | ICD-10-CM | POA: Diagnosis not present

## 2017-12-06 DIAGNOSIS — I2510 Atherosclerotic heart disease of native coronary artery without angina pectoris: Secondary | ICD-10-CM | POA: Diagnosis not present

## 2017-12-06 DIAGNOSIS — N183 Chronic kidney disease, stage 3 (moderate): Secondary | ICD-10-CM | POA: Diagnosis not present

## 2017-12-06 DIAGNOSIS — I5032 Chronic diastolic (congestive) heart failure: Secondary | ICD-10-CM | POA: Diagnosis not present

## 2017-12-06 DIAGNOSIS — E1122 Type 2 diabetes mellitus with diabetic chronic kidney disease: Secondary | ICD-10-CM | POA: Diagnosis not present

## 2017-12-06 DIAGNOSIS — E1140 Type 2 diabetes mellitus with diabetic neuropathy, unspecified: Secondary | ICD-10-CM | POA: Diagnosis not present

## 2017-12-06 NOTE — Telephone Encounter (Signed)
Please call back to check in. See if she needs appointment.

## 2017-12-06 NOTE — Telephone Encounter (Signed)
Tried to contact pt unable to leave message mailbox is full.

## 2017-12-06 NOTE — Telephone Encounter (Signed)
Per note no call back received.

## 2017-12-08 NOTE — Telephone Encounter (Signed)
Pt called to find out when her next appt was, and I advised pt someone had been trying to call her. Pt states her phone is broke, and she is staying with her daughter. Please call the daughter if you need to contact. But I did call to the office.

## 2017-12-09 ENCOUNTER — Other Ambulatory Visit: Payer: Self-pay | Admitting: Family Medicine

## 2017-12-09 DIAGNOSIS — G40909 Epilepsy, unspecified, not intractable, without status epilepticus: Secondary | ICD-10-CM

## 2017-12-09 DIAGNOSIS — I10 Essential (primary) hypertension: Principal | ICD-10-CM

## 2017-12-09 DIAGNOSIS — E1159 Type 2 diabetes mellitus with other circulatory complications: Secondary | ICD-10-CM

## 2017-12-13 ENCOUNTER — Other Ambulatory Visit: Payer: Self-pay | Admitting: Family Medicine

## 2017-12-13 DIAGNOSIS — E1140 Type 2 diabetes mellitus with diabetic neuropathy, unspecified: Secondary | ICD-10-CM | POA: Diagnosis not present

## 2017-12-13 DIAGNOSIS — I5032 Chronic diastolic (congestive) heart failure: Secondary | ICD-10-CM | POA: Diagnosis not present

## 2017-12-13 DIAGNOSIS — L89610 Pressure ulcer of right heel, unstageable: Secondary | ICD-10-CM | POA: Diagnosis not present

## 2017-12-13 DIAGNOSIS — E1122 Type 2 diabetes mellitus with diabetic chronic kidney disease: Secondary | ICD-10-CM | POA: Diagnosis not present

## 2017-12-13 DIAGNOSIS — I5033 Acute on chronic diastolic (congestive) heart failure: Secondary | ICD-10-CM

## 2017-12-13 DIAGNOSIS — N183 Chronic kidney disease, stage 3 (moderate): Secondary | ICD-10-CM | POA: Diagnosis not present

## 2017-12-13 DIAGNOSIS — I2510 Atherosclerotic heart disease of native coronary artery without angina pectoris: Secondary | ICD-10-CM | POA: Diagnosis not present

## 2017-12-14 NOTE — Telephone Encounter (Signed)
Yes, please.

## 2017-12-14 NOTE — Telephone Encounter (Signed)
Called patient not able to reach has been 8 days do you want me to send message?

## 2017-12-15 NOTE — Telephone Encounter (Signed)
Letter mailed

## 2017-12-16 ENCOUNTER — Other Ambulatory Visit: Payer: Self-pay | Admitting: Family Medicine

## 2017-12-16 DIAGNOSIS — G894 Chronic pain syndrome: Secondary | ICD-10-CM

## 2017-12-16 DIAGNOSIS — G43009 Migraine without aura, not intractable, without status migrainosus: Secondary | ICD-10-CM

## 2017-12-16 MED ORDER — TRAMADOL HCL 50 MG PO TABS: 50.0000 mg | ORAL_TABLET | Freq: Four times a day (QID) | ORAL | 0 refills | Status: DC | PRN

## 2017-12-16 NOTE — Telephone Encounter (Signed)
Last OV 10/31/17 Last refill 10/31/17 #30/0 Next OV 01/31/18

## 2017-12-16 NOTE — Telephone Encounter (Signed)
Copied from [place] [id number]. Topic: Quick Communication - Rx Refill/Question >> Dec 16, 2017  9:19 AM Oneta Rack wrote:  Medication:traMADol Veatrice Bourbon) 50 MG tablet    Has the patient contacted their pharmacy?yes   (Agent: If yes, when and what did the pharmacy advise?) contact PCP office   Preferred Pharmacy (with phone number or street name):  [facility] [place], [place]  Agent: Please be advised that RX refills may take up to 3 business days. We ask that you follow-up with your pharmacy.

## 2018-01-02 DIAGNOSIS — E1122 Type 2 diabetes mellitus with diabetic chronic kidney disease: Secondary | ICD-10-CM | POA: Diagnosis not present

## 2018-01-02 DIAGNOSIS — N183 Chronic kidney disease, stage 3 (moderate): Secondary | ICD-10-CM | POA: Diagnosis not present

## 2018-01-02 DIAGNOSIS — I5032 Chronic diastolic (congestive) heart failure: Secondary | ICD-10-CM | POA: Diagnosis not present

## 2018-01-02 DIAGNOSIS — L89610 Pressure ulcer of right heel, unstageable: Secondary | ICD-10-CM | POA: Diagnosis not present

## 2018-01-02 DIAGNOSIS — I2510 Atherosclerotic heart disease of native coronary artery without angina pectoris: Secondary | ICD-10-CM | POA: Diagnosis not present

## 2018-01-02 DIAGNOSIS — E1140 Type 2 diabetes mellitus with diabetic neuropathy, unspecified: Secondary | ICD-10-CM | POA: Diagnosis not present

## 2018-01-06 ENCOUNTER — Telehealth: Payer: Self-pay

## 2018-01-06 NOTE — Telephone Encounter (Signed)
ppw placed in folder for your review.

## 2018-01-09 DIAGNOSIS — D631 Anemia in chronic kidney disease: Secondary | ICD-10-CM | POA: Diagnosis not present

## 2018-01-09 DIAGNOSIS — I10 Essential (primary) hypertension: Secondary | ICD-10-CM | POA: Diagnosis not present

## 2018-01-09 DIAGNOSIS — Z6841 Body Mass Index (BMI) 40.0 and over, adult: Secondary | ICD-10-CM | POA: Diagnosis not present

## 2018-01-09 DIAGNOSIS — G40909 Epilepsy, unspecified, not intractable, without status epilepticus: Secondary | ICD-10-CM

## 2018-01-09 DIAGNOSIS — Z7902 Long term (current) use of antithrombotics/antiplatelets: Secondary | ICD-10-CM

## 2018-01-09 DIAGNOSIS — L89610 Pressure ulcer of right heel, unstageable: Secondary | ICD-10-CM | POA: Diagnosis not present

## 2018-01-09 DIAGNOSIS — I252 Old myocardial infarction: Secondary | ICD-10-CM | POA: Diagnosis not present

## 2018-01-09 DIAGNOSIS — Z87891 Personal history of nicotine dependence: Secondary | ICD-10-CM

## 2018-01-09 DIAGNOSIS — N183 Chronic kidney disease, stage 3 (moderate): Secondary | ICD-10-CM | POA: Diagnosis not present

## 2018-01-09 DIAGNOSIS — Z794 Long term (current) use of insulin: Secondary | ICD-10-CM | POA: Diagnosis not present

## 2018-01-09 DIAGNOSIS — I350 Nonrheumatic aortic (valve) stenosis: Secondary | ICD-10-CM | POA: Diagnosis not present

## 2018-01-09 DIAGNOSIS — I5032 Chronic diastolic (congestive) heart failure: Secondary | ICD-10-CM | POA: Diagnosis not present

## 2018-01-09 DIAGNOSIS — I2510 Atherosclerotic heart disease of native coronary artery without angina pectoris: Secondary | ICD-10-CM | POA: Diagnosis not present

## 2018-01-09 DIAGNOSIS — F329 Major depressive disorder, single episode, unspecified: Secondary | ICD-10-CM

## 2018-01-09 DIAGNOSIS — Z9981 Dependence on supplemental oxygen: Secondary | ICD-10-CM

## 2018-01-09 DIAGNOSIS — E1122 Type 2 diabetes mellitus with diabetic chronic kidney disease: Secondary | ICD-10-CM | POA: Diagnosis not present

## 2018-01-15 ENCOUNTER — Other Ambulatory Visit: Payer: Self-pay | Admitting: Family Medicine

## 2018-01-15 DIAGNOSIS — E1142 Type 2 diabetes mellitus with diabetic polyneuropathy: Secondary | ICD-10-CM

## 2018-01-22 ENCOUNTER — Emergency Department (HOSPITAL_COMMUNITY): Payer: Medicare Other

## 2018-01-22 ENCOUNTER — Inpatient Hospital Stay (HOSPITAL_COMMUNITY): Payer: Medicare Other

## 2018-01-22 ENCOUNTER — Inpatient Hospital Stay (HOSPITAL_COMMUNITY)
Admission: EM | Admit: 2018-01-22 | Discharge: 2018-01-29 | DRG: 291 | Disposition: A | Discharge status: Home Health | Payer: Medicare Other | Attending: Family Medicine | Admitting: Family Medicine

## 2018-01-22 ENCOUNTER — Encounter (HOSPITAL_COMMUNITY): Payer: Self-pay

## 2018-01-22 ENCOUNTER — Other Ambulatory Visit: Payer: Self-pay

## 2018-01-22 DIAGNOSIS — I252 Old myocardial infarction: Secondary | ICD-10-CM | POA: Diagnosis not present

## 2018-01-22 DIAGNOSIS — I5033 Acute on chronic diastolic (congestive) heart failure: Secondary | ICD-10-CM

## 2018-01-22 DIAGNOSIS — F0390 Unspecified dementia without behavioral disturbance: Secondary | ICD-10-CM | POA: Diagnosis present

## 2018-01-22 DIAGNOSIS — J9601 Acute respiratory failure with hypoxia: Secondary | ICD-10-CM | POA: Diagnosis not present

## 2018-01-22 DIAGNOSIS — Z9981 Dependence on supplemental oxygen: Secondary | ICD-10-CM | POA: Diagnosis not present

## 2018-01-22 DIAGNOSIS — Z8042 Family history of malignant neoplasm of prostate: Secondary | ICD-10-CM

## 2018-01-22 DIAGNOSIS — I083 Combined rheumatic disorders of mitral, aortic and tricuspid valves: Secondary | ICD-10-CM | POA: Diagnosis present

## 2018-01-22 DIAGNOSIS — Z794 Long term (current) use of insulin: Secondary | ICD-10-CM

## 2018-01-22 DIAGNOSIS — Z978 Presence of other specified devices: Secondary | ICD-10-CM

## 2018-01-22 DIAGNOSIS — I350 Nonrheumatic aortic (valve) stenosis: Secondary | ICD-10-CM | POA: Diagnosis not present

## 2018-01-22 DIAGNOSIS — Z7189 Other specified counseling: Secondary | ICD-10-CM

## 2018-01-22 DIAGNOSIS — E875 Hyperkalemia: Secondary | ICD-10-CM | POA: Diagnosis not present

## 2018-01-22 DIAGNOSIS — I214 Non-ST elevation (NSTEMI) myocardial infarction: Secondary | ICD-10-CM

## 2018-01-22 DIAGNOSIS — I130 Hypertensive heart and chronic kidney disease with heart failure and stage 1 through stage 4 chronic kidney disease, or unspecified chronic kidney disease: Principal | ICD-10-CM | POA: Diagnosis present

## 2018-01-22 DIAGNOSIS — L8990 Pressure ulcer of unspecified site, unspecified stage: Secondary | ICD-10-CM

## 2018-01-22 DIAGNOSIS — L89613 Pressure ulcer of right heel, stage 3: Secondary | ICD-10-CM | POA: Diagnosis not present

## 2018-01-22 DIAGNOSIS — J9622 Acute and chronic respiratory failure with hypercapnia: Secondary | ICD-10-CM | POA: Diagnosis present

## 2018-01-22 DIAGNOSIS — R0602 Shortness of breath: Secondary | ICD-10-CM | POA: Diagnosis not present

## 2018-01-22 DIAGNOSIS — G40909 Epilepsy, unspecified, not intractable, without status epilepticus: Secondary | ICD-10-CM

## 2018-01-22 DIAGNOSIS — J811 Chronic pulmonary edema: Secondary | ICD-10-CM | POA: Diagnosis not present

## 2018-01-22 DIAGNOSIS — I248 Other forms of acute ischemic heart disease: Secondary | ICD-10-CM | POA: Diagnosis not present

## 2018-01-22 DIAGNOSIS — Z79899 Other long term (current) drug therapy: Secondary | ICD-10-CM

## 2018-01-22 DIAGNOSIS — E119 Type 2 diabetes mellitus without complications: Secondary | ICD-10-CM

## 2018-01-22 DIAGNOSIS — S0083XA Contusion of other part of head, initial encounter: Secondary | ICD-10-CM | POA: Diagnosis present

## 2018-01-22 DIAGNOSIS — I272 Pulmonary hypertension, unspecified: Secondary | ICD-10-CM | POA: Diagnosis present

## 2018-01-22 DIAGNOSIS — I10 Essential (primary) hypertension: Secondary | ICD-10-CM | POA: Diagnosis not present

## 2018-01-22 DIAGNOSIS — J449 Chronic obstructive pulmonary disease, unspecified: Secondary | ICD-10-CM | POA: Diagnosis present

## 2018-01-22 DIAGNOSIS — I5043 Acute on chronic combined systolic (congestive) and diastolic (congestive) heart failure: Secondary | ICD-10-CM

## 2018-01-22 DIAGNOSIS — Z885 Allergy status to narcotic agent status: Secondary | ICD-10-CM

## 2018-01-22 DIAGNOSIS — F419 Anxiety disorder, unspecified: Secondary | ICD-10-CM | POA: Diagnosis not present

## 2018-01-22 DIAGNOSIS — E1159 Type 2 diabetes mellitus with other circulatory complications: Secondary | ICD-10-CM | POA: Diagnosis not present

## 2018-01-22 DIAGNOSIS — R410 Disorientation, unspecified: Secondary | ICD-10-CM | POA: Diagnosis not present

## 2018-01-22 DIAGNOSIS — N183 Chronic kidney disease, stage 3 unspecified: Secondary | ICD-10-CM | POA: Diagnosis present

## 2018-01-22 DIAGNOSIS — J9811 Atelectasis: Secondary | ICD-10-CM | POA: Diagnosis not present

## 2018-01-22 DIAGNOSIS — E874 Mixed disorder of acid-base balance: Secondary | ICD-10-CM | POA: Diagnosis present

## 2018-01-22 DIAGNOSIS — Z789 Other specified health status: Secondary | ICD-10-CM

## 2018-01-22 DIAGNOSIS — W19XXXA Unspecified fall, initial encounter: Secondary | ICD-10-CM | POA: Diagnosis present

## 2018-01-22 DIAGNOSIS — J9611 Chronic respiratory failure with hypoxia: Secondary | ICD-10-CM | POA: Diagnosis not present

## 2018-01-22 DIAGNOSIS — Z7401 Bed confinement status: Secondary | ICD-10-CM | POA: Diagnosis not present

## 2018-01-22 DIAGNOSIS — Z87891 Personal history of nicotine dependence: Secondary | ICD-10-CM

## 2018-01-22 DIAGNOSIS — I2510 Atherosclerotic heart disease of native coronary artery without angina pectoris: Secondary | ICD-10-CM

## 2018-01-22 DIAGNOSIS — R0902 Hypoxemia: Secondary | ICD-10-CM | POA: Diagnosis not present

## 2018-01-22 DIAGNOSIS — D631 Anemia in chronic kidney disease: Secondary | ICD-10-CM | POA: Diagnosis present

## 2018-01-22 DIAGNOSIS — J9621 Acute and chronic respiratory failure with hypoxia: Secondary | ICD-10-CM | POA: Diagnosis present

## 2018-01-22 DIAGNOSIS — R069 Unspecified abnormalities of breathing: Secondary | ICD-10-CM | POA: Diagnosis not present

## 2018-01-22 DIAGNOSIS — K760 Fatty (change of) liver, not elsewhere classified: Secondary | ICD-10-CM | POA: Diagnosis present

## 2018-01-22 DIAGNOSIS — Z8673 Personal history of transient ischemic attack (TIA), and cerebral infarction without residual deficits: Secondary | ICD-10-CM

## 2018-01-22 DIAGNOSIS — Z79890 Hormone replacement therapy (postmenopausal): Secondary | ICD-10-CM

## 2018-01-22 DIAGNOSIS — J80 Acute respiratory distress syndrome: Secondary | ICD-10-CM | POA: Diagnosis not present

## 2018-01-22 DIAGNOSIS — E1122 Type 2 diabetes mellitus with diabetic chronic kidney disease: Secondary | ICD-10-CM | POA: Diagnosis present

## 2018-01-22 DIAGNOSIS — N179 Acute kidney failure, unspecified: Secondary | ICD-10-CM | POA: Diagnosis present

## 2018-01-22 DIAGNOSIS — I361 Nonrheumatic tricuspid (valve) insufficiency: Secondary | ICD-10-CM | POA: Diagnosis not present

## 2018-01-22 DIAGNOSIS — E1165 Type 2 diabetes mellitus with hyperglycemia: Secondary | ICD-10-CM | POA: Diagnosis not present

## 2018-01-22 DIAGNOSIS — Z7982 Long term (current) use of aspirin: Secondary | ICD-10-CM

## 2018-01-22 DIAGNOSIS — Z515 Encounter for palliative care: Secondary | ICD-10-CM | POA: Diagnosis not present

## 2018-01-22 DIAGNOSIS — D638 Anemia in other chronic diseases classified elsewhere: Secondary | ICD-10-CM | POA: Diagnosis present

## 2018-01-22 DIAGNOSIS — E785 Hyperlipidemia, unspecified: Secondary | ICD-10-CM | POA: Diagnosis present

## 2018-01-22 DIAGNOSIS — K219 Gastro-esophageal reflux disease without esophagitis: Secondary | ICD-10-CM | POA: Diagnosis present

## 2018-01-22 DIAGNOSIS — I25118 Atherosclerotic heart disease of native coronary artery with other forms of angina pectoris: Secondary | ICD-10-CM | POA: Diagnosis not present

## 2018-01-22 DIAGNOSIS — Z825 Family history of asthma and other chronic lower respiratory diseases: Secondary | ICD-10-CM

## 2018-01-22 DIAGNOSIS — Z9289 Personal history of other medical treatment: Secondary | ICD-10-CM

## 2018-01-22 DIAGNOSIS — E1169 Type 2 diabetes mellitus with other specified complication: Secondary | ICD-10-CM | POA: Diagnosis present

## 2018-01-22 DIAGNOSIS — Z6841 Body Mass Index (BMI) 40.0 and over, adult: Secondary | ICD-10-CM

## 2018-01-22 DIAGNOSIS — I351 Nonrheumatic aortic (valve) insufficiency: Secondary | ICD-10-CM | POA: Diagnosis not present

## 2018-01-22 DIAGNOSIS — F418 Other specified anxiety disorders: Secondary | ICD-10-CM | POA: Diagnosis present

## 2018-01-22 DIAGNOSIS — M2550 Pain in unspecified joint: Secondary | ICD-10-CM | POA: Diagnosis not present

## 2018-01-22 DIAGNOSIS — Z7902 Long term (current) use of antithrombotics/antiplatelets: Secondary | ICD-10-CM

## 2018-01-22 DIAGNOSIS — E039 Hypothyroidism, unspecified: Secondary | ICD-10-CM | POA: Diagnosis present

## 2018-01-22 DIAGNOSIS — Z8249 Family history of ischemic heart disease and other diseases of the circulatory system: Secondary | ICD-10-CM

## 2018-01-22 DIAGNOSIS — I340 Nonrheumatic mitral (valve) insufficiency: Secondary | ICD-10-CM | POA: Diagnosis not present

## 2018-01-22 DIAGNOSIS — D539 Nutritional anemia, unspecified: Secondary | ICD-10-CM | POA: Diagnosis present

## 2018-01-22 DIAGNOSIS — Z833 Family history of diabetes mellitus: Secondary | ICD-10-CM

## 2018-01-22 DIAGNOSIS — I110 Hypertensive heart disease with heart failure: Secondary | ICD-10-CM | POA: Diagnosis not present

## 2018-01-22 DIAGNOSIS — Z90710 Acquired absence of both cervix and uterus: Secondary | ICD-10-CM

## 2018-01-22 DIAGNOSIS — Z4682 Encounter for fitting and adjustment of non-vascular catheter: Secondary | ICD-10-CM | POA: Diagnosis not present

## 2018-01-22 DIAGNOSIS — R918 Other nonspecific abnormal finding of lung field: Secondary | ICD-10-CM | POA: Diagnosis not present

## 2018-01-22 DIAGNOSIS — Z841 Family history of disorders of kidney and ureter: Secondary | ICD-10-CM

## 2018-01-22 LAB — BLOOD GAS, ARTERIAL
Acid-base deficit: 7.9 mmol/L — ABNORMAL HIGH (ref 0.0–2.0)
Acid-base deficit: 7.3 mmol/L — ABNORMAL HIGH (ref 0.0–2.0)
Acid-base deficit: 7.8 mmol/L — ABNORMAL HIGH (ref 0.0–2.0)
Bicarbonate: 18.9 mmol/L — ABNORMAL LOW (ref 20.0–28.0)
Bicarbonate: 21.2 mmol/L (ref 20.0–28.0)
Bicarbonate: 20.2 mmol/L (ref 20.0–28.0)
DRAWN BY: 365271
Delivery systems: POSITIVE
Drawn by: 330991
Drawn by: 365271
Expiratory PAP: 6
FIO2: 50
FIO2: 1
FIO2: 80
Inspiratory PAP: 12
MECHVT: 350 mL
MECHVT: 350 mL
O2 Saturation: 97.8 %
O2 Saturation: 97.3 %
O2 Saturation: 98.6 %
PEEP: 5 cmH2O
PEEP: 5 cmH2O
Patient temperature: 98.6
Patient temperature: 98.6
Patient temperature: 98.6
RATE: 14 resp/min
RATE: 26 resp/min
pCO2 arterial: 51.1 mmHg — ABNORMAL HIGH (ref 32.0–48.0)
pCO2 arterial: 72.3 mmHg (ref 32.0–48.0)
pCO2 arterial: 65.2 mmHg (ref 32.0–48.0)
pH, Arterial: 7.192 — CL (ref 7.350–7.450)
pH, Arterial: 7.094 — CL (ref 7.350–7.450)
pH, Arterial: 7.118 — CL (ref 7.350–7.450)
pO2, Arterial: 132 mmHg — ABNORMAL HIGH (ref 83.0–108.0)
pO2, Arterial: 139 mmHg — ABNORMAL HIGH (ref 83.0–108.0)
pO2, Arterial: 195 mmHg — ABNORMAL HIGH (ref 83.0–108.0)

## 2018-01-22 LAB — BASIC METABOLIC PANEL
Anion gap: 13 (ref 5–15)
Anion gap: 7 (ref 5–15)
BUN: 39 mg/dL — ABNORMAL HIGH (ref 8–23)
BUN: 54 mg/dL — ABNORMAL HIGH (ref 8–23)
CHLORIDE: 106 mmol/L (ref 98–111)
CO2: 19 mmol/L — ABNORMAL LOW (ref 22–32)
CO2: 22 mmol/L (ref 22–32)
Calcium: 9.4 mg/dL (ref 8.9–10.3)
Calcium: 8.7 mg/dL — ABNORMAL LOW (ref 8.9–10.3)
Chloride: 110 mmol/L (ref 98–111)
Creatinine, Ser: 1.1 mg/dL — ABNORMAL HIGH (ref 0.44–1.00)
Creatinine, Ser: 1.47 mg/dL — ABNORMAL HIGH (ref 0.44–1.00)
GFR calc Af Amer: 58 mL/min — ABNORMAL LOW (ref >60)
GFR calc Af Amer: 41 mL/min — ABNORMAL LOW (ref >60)
GFR calc non Af Amer: 35 mL/min — ABNORMAL LOW (ref >60)
GFR, EST NON AFRICAN AMERICAN: 50 mL/min — AB (ref >60)
Glucose, Bld: 208 mg/dL — ABNORMAL HIGH (ref 70–99)
Glucose, Bld: 256 mg/dL — ABNORMAL HIGH (ref 70–99)
POTASSIUM: 6 mmol/L — AB (ref 3.5–5.1)
Potassium: 5.4 mmol/L — ABNORMAL HIGH (ref 3.5–5.1)
Sodium: 142 mmol/L (ref 135–145)
Sodium: 135 mmol/L (ref 135–145)

## 2018-01-22 LAB — MAGNESIUM
MAGNESIUM: 2.1 mg/dL (ref 1.7–2.4)
Magnesium: 2.1 mg/dL (ref 1.7–2.4)

## 2018-01-22 LAB — CBC WITH DIFFERENTIAL/PLATELET
Abs Immature Granulocytes: 0.04 10*3/uL (ref 0.00–0.07)
Basophils Absolute: 0 10*3/uL (ref 0.0–0.1)
Basophils Relative: 0 %
Eosinophils Absolute: 0.1 10*3/uL (ref 0.0–0.5)
Eosinophils Relative: 1 %
HCT: 32.7 % — ABNORMAL LOW (ref 36.0–46.0)
Hemoglobin: 9.4 g/dL — ABNORMAL LOW (ref 12.0–15.0)
Immature Granulocytes: 0 %
Lymphocytes Relative: 26 %
Lymphs Abs: 2.5 10*3/uL (ref 0.7–4.0)
MCH: 29.1 pg (ref 26.0–34.0)
MCHC: 28.7 g/dL — ABNORMAL LOW (ref 30.0–36.0)
MCV: 101.2 fL — AB (ref 80.0–100.0)
MONOS PCT: 6 %
Monocytes Absolute: 0.6 10*3/uL (ref 0.1–1.0)
Neutro Abs: 6.3 10*3/uL (ref 1.7–7.7)
Neutrophils Relative %: 67 %
Platelets: 298 10*3/uL (ref 150–400)
RBC: 3.23 MIL/uL — ABNORMAL LOW (ref 3.87–5.11)
RDW: 16.1 % — ABNORMAL HIGH (ref 11.5–15.5)
WBC: 9.5 10*3/uL (ref 4.0–10.5)
nRBC: 0 % (ref 0.0–0.2)

## 2018-01-22 LAB — POCT I-STAT 3, ART BLOOD GAS (G3+)
Acid-base deficit: 2 mmol/L (ref 0.0–2.0)
Bicarbonate: 24 mmol/L (ref 20.0–28.0)
O2 Saturation: 100 %
PCO2 ART: 46.1 mmHg (ref 32.0–48.0)
PO2 ART: 197 mmHg — AB (ref 83.0–108.0)
Patient temperature: 97.2
TCO2: 25 mmol/L (ref 22–32)
pH, Arterial: 7.32 — ABNORMAL LOW (ref 7.350–7.450)

## 2018-01-22 LAB — BRAIN NATRIURETIC PEPTIDE: B Natriuretic Peptide: 2778 pg/mL — ABNORMAL HIGH (ref 0.0–100.0)

## 2018-01-22 LAB — TROPONIN I
Troponin I: 2.63 ng/mL (ref <0.03)
Troponin I: 2.66 ng/mL (ref <0.03)
Troponin I: 3.91 ng/mL (ref <0.03)
Troponin I: 10.71 ng/mL (ref <0.03)

## 2018-01-22 LAB — PHOSPHORUS
PHOSPHORUS: 4.6 mg/dL (ref 2.5–4.6)
Phosphorus: 5.2 mg/dL — ABNORMAL HIGH (ref 2.5–4.6)

## 2018-01-22 LAB — GLUCOSE, CAPILLARY
Glucose-Capillary: 294 mg/dL — ABNORMAL HIGH (ref 70–99)
Glucose-Capillary: 281 mg/dL — ABNORMAL HIGH (ref 70–99)
Glucose-Capillary: 320 mg/dL — ABNORMAL HIGH (ref 70–99)
Glucose-Capillary: 281 mg/dL — ABNORMAL HIGH (ref 70–99)
Glucose-Capillary: 241 mg/dL — ABNORMAL HIGH (ref 70–99)
Glucose-Capillary: 246 mg/dL — ABNORMAL HIGH (ref 70–99)

## 2018-01-22 LAB — APTT: aPTT: 31 seconds (ref 24–36)

## 2018-01-22 LAB — PROTIME-INR
INR: 1.35
Prothrombin Time: 16.6 seconds — ABNORMAL HIGH (ref 11.4–15.2)

## 2018-01-22 LAB — MRSA PCR SCREENING: MRSA by PCR: POSITIVE — AB

## 2018-01-22 MED ORDER — FUROSEMIDE 10 MG/ML IJ SOLN
40.0000 mg | Freq: Once | INTRAMUSCULAR | Status: AC
  Administered 2018-01-22: 40 mg via INTRAVENOUS
  Filled 2018-01-22: qty 4

## 2018-01-22 MED ORDER — SODIUM CHLORIDE 0.9% FLUSH
3.0000 mL | Freq: Two times a day (BID) | INTRAVENOUS | Status: DC
  Administered 2018-01-22 – 2018-01-28 (×10): 3 mL via INTRAVENOUS

## 2018-01-22 MED ORDER — ROCURONIUM BROMIDE 50 MG/5ML IV SOLN
50.0000 mg | Freq: Once | INTRAVENOUS | Status: AC
  Administered 2018-01-22: 50 mg via INTRAVENOUS

## 2018-01-22 MED ORDER — FENTANYL CITRATE (PF) 100 MCG/2ML IJ SOLN
INTRAMUSCULAR | Status: AC
  Administered 2018-01-22: 100 ug via INTRAVENOUS
  Filled 2018-01-22: qty 2

## 2018-01-22 MED ORDER — ISOSORBIDE MONONITRATE ER 60 MG PO TB24
60.0000 mg | ORAL_TABLET | Freq: Every day | ORAL | Status: DC
  Administered 2018-01-22: 60 mg via ORAL
  Filled 2018-01-22: qty 1

## 2018-01-22 MED ORDER — ASPIRIN 81 MG PO CHEW
81.0000 mg | CHEWABLE_TABLET | Freq: Every day | ORAL | Status: DC
  Administered 2018-01-22: 81 mg via ORAL
  Filled 2018-01-22: qty 1

## 2018-01-22 MED ORDER — VITAL HIGH PROTEIN PO LIQD
1000.0000 mL | ORAL | Status: DC
  Administered 2018-01-22: 1000 mL

## 2018-01-22 MED ORDER — INSULIN ASPART 100 UNIT/ML ~~LOC~~ SOLN
0.0000 [IU] | SUBCUTANEOUS | Status: DC
  Administered 2018-01-22: 5 [IU] via SUBCUTANEOUS
  Administered 2018-01-22: 3 [IU] via SUBCUTANEOUS
  Administered 2018-01-22: 7 [IU] via SUBCUTANEOUS
  Administered 2018-01-22: 3 [IU] via SUBCUTANEOUS
  Administered 2018-01-23: 1 [IU] via SUBCUTANEOUS
  Administered 2018-01-23 (×4): 2 [IU] via SUBCUTANEOUS
  Administered 2018-01-23: 1 [IU] via SUBCUTANEOUS
  Administered 2018-01-24 – 2018-01-25 (×5): 2 [IU] via SUBCUTANEOUS
  Administered 2018-01-25 (×2): 3 [IU] via SUBCUTANEOUS
  Administered 2018-01-25 (×3): 2 [IU] via SUBCUTANEOUS
  Administered 2018-01-26 (×2): 5 [IU] via SUBCUTANEOUS
  Administered 2018-01-26: 3 [IU] via SUBCUTANEOUS

## 2018-01-22 MED ORDER — FENTANYL 2500MCG IN NS 250ML (10MCG/ML) PREMIX INFUSION
25.0000 ug/h | INTRAVENOUS | Status: DC
  Administered 2018-01-22 – 2018-01-25 (×3): 50 ug/h via INTRAVENOUS
  Filled 2018-01-22 (×3): qty 250

## 2018-01-22 MED ORDER — ONDANSETRON HCL 4 MG/2ML IJ SOLN: 4.0000 mg | Freq: Four times a day (QID) | INTRAMUSCULAR | Status: DC | PRN

## 2018-01-22 MED ORDER — FENTANYL CITRATE (PF) 100 MCG/2ML IJ SOLN
100.0000 ug | Freq: Once | INTRAMUSCULAR | Status: AC
  Administered 2018-01-22: 100 ug via INTRAVENOUS

## 2018-01-22 MED ORDER — GERHARDT'S BUTT CREAM
TOPICAL_CREAM | Freq: Three times a day (TID) | CUTANEOUS | Status: DC
  Administered 2018-01-22 – 2018-01-24 (×6): via TOPICAL
  Administered 2018-01-24: 1 via TOPICAL
  Administered 2018-01-24 – 2018-01-29 (×13): via TOPICAL
  Filled 2018-01-22: qty 1

## 2018-01-22 MED ORDER — ALPRAZOLAM 0.5 MG PO TABS
0.5000 mg | ORAL_TABLET | Freq: Every day | ORAL | Status: DC
  Administered 2018-01-22 – 2018-01-25 (×4): 0.5 mg
  Filled 2018-01-22 (×4): qty 1

## 2018-01-22 MED ORDER — FAMOTIDINE IN NACL 20-0.9 MG/50ML-% IV SOLN
20.0000 mg | Freq: Two times a day (BID) | INTRAVENOUS | Status: DC
  Administered 2018-01-22 – 2018-01-25 (×7): 20 mg via INTRAVENOUS
  Filled 2018-01-22 (×7): qty 50

## 2018-01-22 MED ORDER — HEPARIN (PORCINE) 25000 UT/250ML-% IV SOLN
1200.0000 [IU]/h | INTRAVENOUS | Status: DC
  Filled 2018-01-22: qty 250

## 2018-01-22 MED ORDER — ALBUTEROL SULFATE (2.5 MG/3ML) 0.083% IN NEBU: 2.5000 mg | INHALATION_SOLUTION | RESPIRATORY_TRACT | Status: DC | PRN

## 2018-01-22 MED ORDER — ACETAMINOPHEN 325 MG PO TABS: 650.0000 mg | ORAL_TABLET | ORAL | Status: DC | PRN

## 2018-01-22 MED ORDER — ROSUVASTATIN CALCIUM 20 MG PO TABS: 20.0000 mg | ORAL_TABLET | Freq: Every evening | ORAL | Status: DC

## 2018-01-22 MED ORDER — MIDAZOLAM HCL 2 MG/2ML IJ SOLN
2.0000 mg | Freq: Once | INTRAMUSCULAR | Status: AC
  Administered 2018-01-22: 2 mg via INTRAVENOUS

## 2018-01-22 MED ORDER — ORAL CARE MOUTH RINSE
15.0000 mL | OROMUCOSAL | Status: DC
  Administered 2018-01-22 – 2018-01-26 (×35): 15 mL via OROMUCOSAL

## 2018-01-22 MED ORDER — MORPHINE SULFATE (PF) 2 MG/ML IV SOLN
1.0000 mg | INTRAVENOUS | Status: DC | PRN
  Administered 2018-01-22: 2 mg via INTRAVENOUS
  Filled 2018-01-22: qty 1

## 2018-01-22 MED ORDER — ASPIRIN 81 MG PO CHEW
81.0000 mg | CHEWABLE_TABLET | Freq: Every day | ORAL | Status: DC
  Administered 2018-01-23 – 2018-01-27 (×5): 81 mg
  Filled 2018-01-22 (×5): qty 1

## 2018-01-22 MED ORDER — METOPROLOL TARTRATE 5 MG/5ML IV SOLN: 5.0000 mg | Freq: Once | INTRAVENOUS | Status: DC

## 2018-01-22 MED ORDER — MIDAZOLAM HCL 2 MG/2ML IJ SOLN
1.0000 mg | INTRAMUSCULAR | Status: DC | PRN
  Administered 2018-01-24: 1 mg via INTRAVENOUS
  Filled 2018-01-22: qty 2

## 2018-01-22 MED ORDER — CHLORHEXIDINE GLUCONATE 0.12% ORAL RINSE (MEDLINE KIT)
15.0000 mL | Freq: Two times a day (BID) | OROMUCOSAL | Status: DC
  Administered 2018-01-22 – 2018-01-29 (×11): 15 mL via OROMUCOSAL

## 2018-01-22 MED ORDER — SODIUM CHLORIDE 0.9% FLUSH: 3.0000 mL | INTRAVENOUS | Status: DC | PRN

## 2018-01-22 MED ORDER — ROSUVASTATIN CALCIUM 20 MG PO TABS
20.0000 mg | ORAL_TABLET | Freq: Every evening | ORAL | Status: DC
  Administered 2018-01-23 – 2018-01-25 (×3): 20 mg
  Filled 2018-01-22 (×3): qty 1

## 2018-01-22 MED ORDER — METOPROLOL TARTRATE 5 MG/5ML IV SOLN
5.0000 mg | Freq: Once | INTRAVENOUS | Status: AC
  Administered 2018-01-22: 5 mg via INTRAVENOUS
  Filled 2018-01-22: qty 5

## 2018-01-22 MED ORDER — INSULIN DETEMIR 100 UNIT/ML ~~LOC~~ SOLN
5.0000 [IU] | Freq: Every day | SUBCUTANEOUS | Status: DC
  Administered 2018-01-22 – 2018-01-24 (×3): 5 [IU] via SUBCUTANEOUS
  Filled 2018-01-22 (×3): qty 0.05

## 2018-01-22 MED ORDER — ETOMIDATE 2 MG/ML IV SOLN
20.0000 mg | Freq: Once | INTRAVENOUS | Status: AC
  Administered 2018-01-22: 20 mg via INTRAVENOUS

## 2018-01-22 MED ORDER — ASPIRIN 81 MG PO CHEW
324.0000 mg | CHEWABLE_TABLET | Freq: Once | ORAL | Status: AC
  Administered 2018-01-22: 324 mg via ORAL
  Filled 2018-01-22: qty 4

## 2018-01-22 MED ORDER — FENTANYL BOLUS VIA INFUSION
25.0000 ug | INTRAVENOUS | Status: DC | PRN
  Administered 2018-01-23 – 2018-01-25 (×7): 25 ug via INTRAVENOUS
  Filled 2018-01-22: qty 25

## 2018-01-22 MED ORDER — LOSARTAN POTASSIUM 50 MG PO TABS
25.0000 mg | ORAL_TABLET | Freq: Every day | ORAL | Status: DC
  Administered 2018-01-23 – 2018-01-27 (×5): 25 mg
  Filled 2018-01-22 (×5): qty 1

## 2018-01-22 MED ORDER — LOSARTAN POTASSIUM 50 MG PO TABS
25.0000 mg | ORAL_TABLET | Freq: Every day | ORAL | Status: DC
  Administered 2018-01-22: 25 mg via ORAL
  Filled 2018-01-22: qty 1

## 2018-01-22 MED ORDER — SERTRALINE HCL 50 MG PO TABS
50.0000 mg | ORAL_TABLET | Freq: Every day | ORAL | Status: DC
  Administered 2018-01-22: 50 mg via ORAL
  Filled 2018-01-22: qty 1

## 2018-01-22 MED ORDER — GABAPENTIN 300 MG PO CAPS
300.0000 mg | ORAL_CAPSULE | Freq: Every day | ORAL | Status: DC
  Administered 2018-01-22: 300 mg
  Filled 2018-01-22: qty 1

## 2018-01-22 MED ORDER — CLOPIDOGREL BISULFATE 75 MG PO TABS: 75.0000 mg | ORAL_TABLET | Freq: Every day | ORAL | Status: DC

## 2018-01-22 MED ORDER — LEVETIRACETAM 500 MG PO TABS: 1000.0000 mg | ORAL_TABLET | Freq: Two times a day (BID) | ORAL | Status: DC

## 2018-01-22 MED ORDER — ALPRAZOLAM 0.5 MG PO TABS: 0.5000 mg | ORAL_TABLET | Freq: Every day | ORAL | Status: DC

## 2018-01-22 MED ORDER — GABAPENTIN 300 MG PO CAPS: 300.0000 mg | ORAL_CAPSULE | Freq: Every day | ORAL | Status: DC

## 2018-01-22 MED ORDER — ASPIRIN EC 325 MG PO TBEC: 325.0000 mg | DELAYED_RELEASE_TABLET | Freq: Every day | ORAL | Status: DC

## 2018-01-22 MED ORDER — MIDAZOLAM HCL 2 MG/2ML IJ SOLN
1.0000 mg | INTRAMUSCULAR | Status: DC | PRN
  Administered 2018-01-23: 1 mg via INTRAVENOUS
  Filled 2018-01-22: qty 2

## 2018-01-22 MED ORDER — METOPROLOL TARTRATE 50 MG PO TABS
50.0000 mg | ORAL_TABLET | Freq: Every day | ORAL | Status: DC
  Administered 2018-01-22: 50 mg via ORAL
  Filled 2018-01-22: qty 1

## 2018-01-22 MED ORDER — LORAZEPAM 2 MG/ML IJ SOLN
1.0000 mg | Freq: Four times a day (QID) | INTRAMUSCULAR | Status: DC | PRN
  Administered 2018-01-22: 1 mg via INTRAVENOUS
  Filled 2018-01-22: qty 1

## 2018-01-22 MED ORDER — COLLAGENASE 250 UNIT/GM EX OINT
TOPICAL_OINTMENT | Freq: Every day | CUTANEOUS | Status: DC
  Administered 2018-01-22 – 2018-01-23 (×2): via TOPICAL
  Administered 2018-01-24: 1 via TOPICAL
  Administered 2018-01-25 – 2018-01-29 (×5): via TOPICAL
  Filled 2018-01-22: qty 30

## 2018-01-22 MED ORDER — FUROSEMIDE 10 MG/ML IJ SOLN: 40.0000 mg | Freq: Two times a day (BID) | INTRAMUSCULAR | Status: DC

## 2018-01-22 MED ORDER — SODIUM CHLORIDE 0.9 % IV SOLN
250.0000 mL | INTRAVENOUS | Status: DC | PRN
  Administered 2018-01-22: 250 mL via INTRAVENOUS

## 2018-01-22 MED ORDER — DOCUSATE SODIUM 50 MG/5ML PO LIQD: 100.0000 mg | Freq: Two times a day (BID) | ORAL | Status: DC | PRN

## 2018-01-22 MED ORDER — LEVETIRACETAM 500 MG PO TABS
1000.0000 mg | ORAL_TABLET | Freq: Two times a day (BID) | ORAL | Status: DC
  Administered 2018-01-22: 1000 mg via ORAL
  Filled 2018-01-22: qty 2

## 2018-01-22 MED ORDER — FUROSEMIDE 10 MG/ML IJ SOLN
60.0000 mg | Freq: Once | INTRAMUSCULAR | Status: AC
  Administered 2018-01-22: 60 mg via INTRAVENOUS
  Filled 2018-01-22: qty 6

## 2018-01-22 MED ORDER — MIDAZOLAM HCL 2 MG/2ML IJ SOLN
INTRAMUSCULAR | Status: AC
  Administered 2018-01-22: 2 mg via INTRAVENOUS
  Filled 2018-01-22: qty 2

## 2018-01-22 MED ORDER — FUROSEMIDE 10 MG/ML IJ SOLN
60.0000 mg | Freq: Two times a day (BID) | INTRAMUSCULAR | Status: DC
  Administered 2018-01-22 – 2018-01-23 (×2): 60 mg via INTRAVENOUS
  Filled 2018-01-22 (×2): qty 6

## 2018-01-22 MED ORDER — IPRATROPIUM-ALBUTEROL 0.5-2.5 (3) MG/3ML IN SOLN
3.0000 mL | RESPIRATORY_TRACT | Status: DC | PRN
  Administered 2018-01-22 – 2018-01-26 (×2): 3 mL via RESPIRATORY_TRACT
  Filled 2018-01-22 (×2): qty 3

## 2018-01-22 MED ORDER — HEPARIN (PORCINE) 25000 UT/250ML-% IV SOLN
1200.0000 [IU]/h | INTRAVENOUS | Status: DC
  Administered 2018-01-22: 1200 [IU]/h via INTRAVENOUS
  Filled 2018-01-22: qty 250

## 2018-01-22 MED ORDER — FENTANYL CITRATE (PF) 100 MCG/2ML IJ SOLN: 50.0000 ug | Freq: Once | INTRAMUSCULAR | Status: DC

## 2018-01-22 MED ORDER — POLYETHYLENE GLYCOL 3350 17 G PO PACK: 17.0000 g | PACK | Freq: Every day | ORAL | Status: DC | PRN

## 2018-01-22 MED ORDER — HEPARIN BOLUS VIA INFUSION
4000.0000 [IU] | Freq: Once | INTRAVENOUS | Status: DC
  Filled 2018-01-22: qty 4000

## 2018-01-22 MED ORDER — MORPHINE SULFATE (PF) 2 MG/ML IV SOLN: 2.0000 mg | Freq: Once | INTRAVENOUS | Status: DC

## 2018-01-22 MED ORDER — PRO-STAT SUGAR FREE PO LIQD
30.0000 mL | Freq: Two times a day (BID) | ORAL | Status: DC
  Administered 2018-01-22 – 2018-01-23 (×3): 30 mL
  Filled 2018-01-22 (×3): qty 30

## 2018-01-22 NOTE — ED Provider Notes (Signed)
[facility] EMERGENCY DEPARTMENT Provider Note   CSN: [id number] Arrival date & time: 01/22/18  0143     History   Chief Complaint Chief Complaint  Patient presents with  . Shortness of Breath    HPI [name] is a 73 y.o. female.  The history is provided by the patient. The history is limited by the condition of the patient (On BiPAP).  She has history of hypertension, diabetes, hyperlipidemia, diastolic heart failure, coronary artery disease, GERD, transient ischemic attack and comes in because of worsening shortness of breath today.  She states that she was fine yesterday, but has had shortness of breath today which is gotten worse as the day has gone on.  She denies chest pain, heaviness, tightness, pressure.  She denies cough.  There is been no nausea or vomiting.  She denies fever or chills.  She has been using her albuterol which has given slight, temporary relief.  EMS was called because of progressive dyspnea, and noted she was hypoxic at home with oxygen saturation 78% in spite of getting oxygen at 2 L/min nasal cannula.  She was placed on BiPAP and has been given additional nebulizer treatment with albuterol and ipratropium, intravenous magnesium and intravenous methylprednisolone.  Past Medical History:  Diagnosis Date  . Anxiety   . Coronary artery disease   . Depression   . Diabetes mellitus    insulin dependent  . Edema of lower extremity   . Esophageal stricture 2008   EGD  . Fatigue   . GERD (gastroesophageal reflux disease)   . Heart murmur   . Hiatal hernia 2008   EGD  . Hyperlipidemia   . Hypertension   . Knee pain   . Morbid obesity ([place])   . Nonproductive cough    chronic  . Orthopnea   . Seizure disorder ([place])   . SOB (shortness of breath)   . Syncope and collapse   . Tremor     Patient Active Problem List   Diagnosis Date Noted  . Anxiety 11/02/2017  . Pressure injury of skin of right heel 11/02/2017  . Long term  (current) use of aspirin 09/12/2017  . Mixed hyperlipidemia 09/12/2017  . Stage 3 chronic kidney disease ([place]) 09/12/2017  . Impaired mobility and ADLs 08/29/2017  . Cerebral thrombosis with cerebral infarction 07/27/2017  . Anemia of chronic disease 07/22/2017  . Congestive heart failure ([place]) 06/20/2017  . LV dysfunction 10/06/2016  . 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 Acute on chronic diastolic CHF (congestive heart failure) ([place]) 09/09/2016  . Migraine 08/20/2016  . Morbid obesity ([place]) 08/20/2016  . DOE (dyspnea on exertion), on O2 10/02/2015  . Hyperlipidemia associated with type 2 diabetes mellitus ([place]), on Crestor 10/02/2015  . Nonrheumatic aortic valve stenosis 10/02/2015  . Old MI (myocardial infarction) 10/02/2015  . Primary insomnia 11/03/2014  . Weakness generalized 08/28/2014  . Seizure disorder ([place]) 08/28/2014  . Coronary artery disease of native artery of native heart with stable angina pectoris ([place]) 06/02/2010  . Depression, recurrent ([place]), on Zoloft 05/20/2007  . Hypertension associated with diabetes ([place]) 05/20/2007  . Diaphragmatic hernia 05/20/2007  . Diverticulosis of colon 05/20/2007  . IBS 05/20/2007  . Fatty liver 05/20/2007  . Hypothyroidism 05/20/2007  . Hx of transient ischemic attack (TIA) 05/20/2007    Past Surgical History:  Procedure Laterality Date  . ABDOMINAL HYSTERECTOMY    . CARDIAC CATHETERIZATION  03/28/2009  . KNEE  ARTHROSCOPY    . TRANSTHORACIC ECHOCARDIOGRAM     showed ef of 65% with no regional wall motion abnormalities     OB History   No obstetric history on file.      Home Medications    Prior to Admission medications   Medication Sig Start Date End Date Taking? Authorizing Provider  acetaminophen (TYLENOL) 325 MG tablet Take 2 tablets (650 mg total) by mouth every 4 (four) hours as needed for headache or mild pain. 09/12/16   Robbie Lis, MD  albuterol  (PROVENTIL HFA;VENTOLIN HFA) 108 (90 Base) MCG/ACT inhaler Inhale 2 puffs into the lungs every 6 (six) hours as needed for wheezing or shortness of breath. 10/31/17   Briscoe Deutscher, DO  ALPRAZolam Duanne Moron) 0.5 MG tablet Take 1 tablet (0.5 mg total) by mouth at bedtime. 10/31/17   Briscoe Deutscher, DO  aspirin 325 MG EC tablet Take 1 tablet (325 mg total) by mouth daily. 10/04/17   Briscoe Deutscher, DO  clopidogrel (PLAVIX) 75 MG tablet TAKE ONE TABLET BY MOUTH DAILY 11/08/17   Briscoe Deutscher, DO  collagenase (SANTYL) ointment Apply 1 application topically daily. 10/14/17   Briscoe Deutscher, DO  furosemide (LASIX) 20 MG tablet TAKE ONE TABLET BY MOUTH TWICE A DAY 12/14/17   Briscoe Deutscher, DO  gabapentin (NEURONTIN) 300 MG capsule TAKE ONE CAPSULE BY MOUTH AT BEDTIME 01/16/18   Briscoe Deutscher, DO  glucose blood (ONETOUCH VERIO) test strip 1 each by Other route daily. And lancets 1/day. Dx code E11.9. 08/26/17   Briscoe Deutscher, DO  insulin aspart (NOVOLOG) 100 UNIT/ML injection Inject 0-9 Units into the skin 3 (three) times daily with meals. Sliding scale CBG 70 - 120: 0 units CBG 121 - 150: 1 unit,  CBG 151 - 200: 2 units,  CBG 201 - 250: 3 units,  CBG 251 - 300: 5 units,  CBG 301 - 350: 7 units,  CBG 351 - 400: 9 units   CBG > 400: 9 units and notify your MD 08/26/17   Briscoe Deutscher, DO  insulin detemir (LEVEMIR) 100 UNIT/ML injection Inject 0.1 mLs (10 Units total) into the skin daily. 08/26/17   Briscoe Deutscher, DO  isosorbide mononitrate (IMDUR) 60 MG 24 hr tablet Take 1 tablet (60 mg total) by mouth daily. 10/04/17   Briscoe Deutscher, DO  levETIRAcetam (KEPPRA) 1000 MG tablet TAKE 1 TABLET BY MOUTH TWICE DAILY 12/13/17   Briscoe Deutscher, DO  loratadine (CLARITIN) 10 MG tablet Take 1 tablet (10 mg total) by mouth daily as needed for allergies. 08/26/17   Briscoe Deutscher, DO  losartan (COZAAR) 25 MG tablet TAKE ONE TABLET BY MOUTH DAILY 11/14/17   Briscoe Deutscher, DO  Melatonin 3 MG TABS Take 1 tablet (3 mg total) by  mouth at bedtime. 08/26/17   Briscoe Deutscher, DO  metoprolol tartrate (LOPRESSOR) 50 MG tablet TAKE 1 TABLET BY MOUTH ONCE DAILY 12/13/17   Briscoe Deutscher, DO  ondansetron (ZOFRAN) 4 MG tablet Take 1 tablet (4 mg total) by mouth every 6 (six) hours as needed for nausea or vomiting. 08/26/17   Briscoe Deutscher, DO  oxymetazoline (AFRIN) 0.05 % nasal spray Place 1 spray into both nostrils 2 (two) times daily as needed (nosebleeds). 08/26/17   Briscoe Deutscher, DO  polyethylene glycol (MIRALAX / GLYCOLAX) packet Take 17 g by mouth daily as needed for mild constipation. 08/26/17   Briscoe Deutscher, DO  potassium chloride (K-DUR) 10 MEQ tablet TAKE ONE TABLET BY MOUTH DAILY 12/14/17   Briscoe Deutscher,  DO  potassium chloride (K-DUR,KLOR-CON) 10 MEQ tablet Take 1 tablet (10 mEq total) by mouth daily. 10/04/17   Briscoe Deutscher, DO  rizatriptan (MAXALT) 10 MG tablet TAKE 1 TABLET BY MOUTH DAILY AS NEEDED FOR CHRONIC  PAIN 12/17/17   Briscoe Deutscher, DO  rosuvastatin (CRESTOR) 20 MG tablet Take 1 tablet (20 mg total) by mouth every evening. 10/04/17   Briscoe Deutscher, DO  SANTYL ointment APPLY ONE APPLICATION TOPICALLY DAILY 11/08/17   Briscoe Deutscher, DO  sertraline (ZOLOFT) 50 MG tablet Take 1 tablet (50 mg total) by mouth at bedtime. 10/31/17   Briscoe Deutscher, DO  sodium chloride (OCEAN) 0.65 % SOLN nasal spray Place 2 sprays into both nostrils 3 (three) times daily. 08/26/17   Briscoe Deutscher, DO  traMADol (ULTRAM) 50 MG tablet Take 1 tablet (50 mg total) by mouth every 6 (six) hours as needed. 12/16/17   Briscoe Deutscher, DO    Family History Family History  Problem Relation Age of Onset  . Heart attack Mother   . Hypertension Mother   . COPD Father   . Heart disease Sister   . Diabetes Brother   . Diabetes Brother   . Prostate cancer Brother   . Kidney disease Brother     Social History Social History   Tobacco Use  . Smoking status: Former Smoker    Last attempt to quit: 06/02/1970    Years since quitting:  47.6  . Smokeless tobacco: Never Used  Substance Use Topics  . Alcohol use: No  . Drug use: No     Allergies   Codeine   Review of Systems Review of Systems  Unable to perform ROS: Severe respiratory distress (On BiPAP)     Physical Exam Updated Vital Signs BP (!) 157/85 (BP Location: Right Arm)   Pulse (!) 108   Temp (!) 97.5 F (36.4 C)   Resp (!) 28   Ht 5' (1.524 m)   Wt 100 kg   SpO2 92%   BMI 43.06 kg/m   Physical Exam Vitals signs and nursing note reviewed.    Morbidly obese 73 year old female, resting comfortably and in no acute distress. Vital signs are significant for elevated systolic blood pressure and elevated heart rate and elevated respiratory rate. Oxygen saturation is 92%, which is normal. Head is normocephalic and atraumatic.  She is on BiPAP.  PERRLA, EOMI. Oropharynx is clear. Neck is nontender and supple without adenopathy or JVD. Back is nontender and there is no CVA tenderness. Lungs have markedly diminished airflow with few bibasilar rales heard.  No wheezes or rhonchi appreciated. Chest is nontender. Heart has regular rate and rhythm without murmur. Abdomen is soft, flat, nontender without masses or hepatosplenomegaly and peristalsis is normoactive. Extremities have 2+ edema, full range of motion is present.  Stage II ulcer present on the posterior aspect of the right heel with minimal surrounding erythema, no drainage seen. Skin is warm and dry without rash. Neurologic: Mental status is normal, cranial nerves are intact, there are no motor or sensory deficits.  ED Treatments / Results  Labs (all labs ordered are listed, but only abnormal results are displayed) Labs Reviewed  BASIC METABOLIC PANEL - Abnormal; Notable for the following components:      Result Value   Potassium 5.4 (*)    CO2 19 (*)    Glucose, Bld 208 (*)    BUN 39 (*)    Creatinine, Ser 1.10 (*)    GFR calc non Af Wyvonnia Lora  50 (*)    GFR calc Af Amer 58 (*)    All other  components within normal limits  BRAIN NATRIURETIC PEPTIDE - Abnormal; Notable for the following components:   B Natriuretic Peptide 2,778.0 (*)    All other components within normal limits  TROPONIN I - Abnormal; Notable for the following components:   Troponin I 2.63 (*)    All other components within normal limits  CBC WITH DIFFERENTIAL/PLATELET - Abnormal; Notable for the following components:   RBC 3.23 (*)    Hemoglobin 9.4 (*)    HCT 32.7 (*)    MCV 101.2 (*)    MCHC 28.7 (*)    RDW 16.1 (*)    All other components within normal limits  BLOOD GAS, ARTERIAL - Abnormal; Notable for the following components:   pH, Arterial 7.192 (*)    pCO2 arterial 51.1 (*)    pO2, Arterial 132 (*)    Bicarbonate 18.9 (*)    Acid-base deficit 7.9 (*)    All other components within normal limits  GLUCOSE, CAPILLARY - Abnormal; Notable for the following components:   Glucose-Capillary 294 (*)    All other components within normal limits  HEPARIN LEVEL (UNFRACTIONATED)  TROPONIN I  TROPONIN I  TROPONIN I    EKG EKG Interpretation  Date/Time:  Sunday January 22 2018 02:54:56 EST Ventricular Rate:  105 PR Interval:    QRS Duration: 103 QT Interval:  330 QTC Calculation: 437 R Axis:   2 Text Interpretation:  Sinus tachycardia Repol abnrm suggests ischemia, anterolateral Minimal ST elevation, inferior leads When compared with ECG of 08/22/2017, HEART RATE has increased Confirmed by Delora Fuel ([id number]) on 01/22/2018 3:00:52 AM   Radiology Dg Chest Port 1 View  Result Date: 01/22/2018 CLINICAL DATA:  73 year old female with shortness of breath. EXAM: PORTABLE CHEST 1 VIEW COMPARISON:  Chest radiograph dated 08/22/2017 FINDINGS: There is shallow inspiration with bibasilar atelectasis. Pneumonia is not excluded. Clinical correlation is recommended. Mild central and perihilar vascular prominence may represent mild congestion. No pneumothorax. Stable cardiac silhouette. No acute osseous  pathology. IMPRESSION: Shallow inspiration with bibasilar atelectasis. Pneumonia is not excluded. Electronically Signed   By: Anner Crete M.D.   On: 01/22/2018 02:07    Procedures Procedures  CRITICAL CARE Performed by: Delora Fuel Total critical care time: 90 minutes Critical care time was exclusive of separately billable procedures and treating other patients. Critical care was necessary to treat or prevent imminent or life-threatening deterioration. Critical care was time spent personally by me on the following activities: development of treatment plan with patient and/or surrogate as well as nursing, discussions with consultants, evaluation of patient's response to treatment, examination of patient, obtaining history from patient or surrogate, ordering and performing treatments and interventions, ordering and review of laboratory studies, ordering and review of radiographic studies, pulse oximetry and re-evaluation of patient's condition.  Medications Ordered in ED Medications  heparin bolus via infusion 4,000 Units (0 Units Intravenous Hold 01/22/18 0607)  heparin ADULT infusion 100 units/mL (25000 units/265mL sodium chloride 0.45%) (0 Units/hr Intravenous Hold 01/22/18 0606)  isosorbide mononitrate (IMDUR) 24 hr tablet 60 mg (has no administration in time range)  losartan (COZAAR) tablet 25 mg (has no administration in time range)  metoprolol tartrate (LOPRESSOR) tablet 50 mg (has no administration in time range)  rosuvastatin (CRESTOR) tablet 20 mg (has no administration in time range)  ALPRAZolam (XANAX) tablet 0.5 mg (has no administration in time range)  sertraline (ZOLOFT) tablet 50 mg (has  no administration in time range)  polyethylene glycol (MIRALAX / GLYCOLAX) packet 17 g (has no administration in time range)  gabapentin (NEURONTIN) capsule 300 mg (has no administration in time range)  levETIRAcetam (KEPPRA) tablet 1,000 mg (has no administration in time range)  sodium  chloride flush (NS) 0.9 % injection 3 mL (has no administration in time range)  sodium chloride flush (NS) 0.9 % injection 3 mL (has no administration in time range)  0.9 %  sodium chloride infusion (has no administration in time range)  acetaminophen (TYLENOL) tablet 650 mg (has no administration in time range)  ondansetron (ZOFRAN) injection 4 mg (has no administration in time range)  insulin aspart (novoLOG) injection 0-9 Units (has no administration in time range)  insulin detemir (LEVEMIR) injection 5 Units (has no administration in time range)  furosemide (LASIX) injection 40 mg (has no administration in time range)  aspirin chewable tablet 81 mg (has no administration in time range)  albuterol (PROVENTIL) (2.5 MG/3ML) 0.083% nebulizer solution 2.5 mg (has no administration in time range)  morphine 2 MG/ML injection 1-3 mg (2 mg Intravenous Given 01/22/18 0512)  metoprolol tartrate (LOPRESSOR) injection 5 mg (0 mg Intravenous Hold 01/22/18 0606)  ipratropium-albuterol (DUONEB) 0.5-2.5 (3) MG/3ML nebulizer solution 3 mL (3 mLs Nebulization Given 01/22/18 0613)  morphine 2 MG/ML injection 2 mg (0 mg Intravenous Hold 01/22/18 0606)  LORazepam (ATIVAN) injection 1 mg (1 mg Intravenous Given 01/22/18 0559)  aspirin chewable tablet 324 mg (324 mg Oral Given 01/22/18 0512)  furosemide (LASIX) injection 40 mg (40 mg Intravenous Given 01/22/18 0320)  furosemide (LASIX) injection 60 mg (60 mg Intravenous Given 01/22/18 0512)  metoprolol tartrate (LOPRESSOR) injection 5 mg (5 mg Intravenous Given 01/22/18 0600)     Initial Impression / Assessment and Plan / ED Course  I have reviewed the triage vital signs and the nursing notes.  Pertinent labs & imaging results that were available during my care of the patient were reviewed by me and considered in my medical decision making (see chart for details).  Acute dyspnea.  Suspect CHF exacerbation.  Old records are reviewed, and she is noted to have been  hospitalized at [facility] last June for CHF and non-STEMI, additional admission for non-STEMI in August.  She continued to need support from BiPAP.  Chest x-ray shows shallow inspiration, consistent with CHF.  BNP is markedly elevated at over 2700.  Macrocytic anemia is noted which is not significantly changed from baseline.  Troponin has come back significantly elevated at 2.63, she is significantly higher than it had been and previous non-STEMI's.  ECG shows sinus tachycardia and rather diffuse ST and T changes which are not significantly different from prior ECG.  Case was discussed with cardiology fellow who felt that with a nonischemic ECG, she should be admitted to medical service.  Case is discussed with Dr Myna Hidalgo of [facility] hospitalist, who agrees to admit the patient..  Final Clinical Impressions(s) / ED Diagnoses   Final diagnoses:  NSTEMI (non-ST elevated myocardial infarction) ([place])  Acute on chronic diastolic heart failure ([facility])  Macrocytic anemia  Acute respiratory failure with hypoxia [facility])    ED Discharge Orders    None       Delora Fuel, MD [birth year] [phone]

## 2018-01-22 NOTE — Significant Event (Signed)
Rapid Response Event Note  Overview:  Called by RN stating pt with increase work of breathing. RN also contacted Dr. Thereasa Solo and Cardiology, cardiology consulted PCCM. Dr. Thereasa Solo, Dr. Lake Bells and Stver Minor NP at bedside decision already made to transfer to ICU    Initial Focused Assessment: On arrival pt lethargic easy to arouse with verbal stimulation, alert to self only, mild to moderate respiratory distress, placed on Bipap by RT prior to arrival.   Interventions: Transferred to ICU and intubated    Event Summary:  called 0815  Arrived 0820  Event ended [place], [place]

## 2018-01-22 NOTE — Progress Notes (Signed)
I walked into find the patient markedly short of breath on her bipap. Her sats were fine but she appeared tired and worn out. She was alert to self only. I immediately paged and spoke with Dr. Thereasa Solo who stated he would come and see her and he did. I called Respiratory as well as RR RN. Respiratory adjusted some settings on her bipap. I could auscultate crackles anteriorly. Patient had only voided 50cc after the 2 doses of IV lasix given on prior shift. Cards was consulting on the patient so in the meantime of waiting for Dr. Thereasa Solo, I asked Dr. Debara Pickett to assist with patient. He saw patient, consulted CCM. Patients husband at bedside and up to date as doctors were clearly making him aware of situation. Patient transported with CCM, RR RN, RT and myself to [id number]. Report given to RN at bedside.

## 2018-01-22 NOTE — Progress Notes (Signed)
Boarder line BP : Map 60's Fentanyl decreased to 73mcg: discussed troponin of 3.91 with E-link: patient not on heparin drip :denies CP

## 2018-01-22 NOTE — ED Triage Notes (Signed)
Pt BIB [facility] for eval of SOB. Pt reports she was having worsening SOB throughout the day, became acutely worse in the 1-2 hours PTA. Pt wears 2L [place] 24/7 at home, arrives on CPAP w/ EMS. EMS reports initial RA sats 78%.

## 2018-01-22 NOTE — Procedures (Addendum)
Intubation Procedure Note [name] [id number] 03-20-1945  Procedure: Intubation Indications: Respiratory insufficiency  Procedure Details Consent: Risks of procedure as well as the alternatives and risks of each were explained to the (patient/caregiver).  Consent for procedure obtained. Time Out: Verified patient identification, verified procedure, site/side was marked, verified correct patient position, special equipment/implants available, medications/allergies/relevent history reviewed, required imaging and test results available.  Performed  MAC and 3 Medications:  Fentanyl 100 mcg Etomidate 20 mg Versed 2 mg NMB Rocuronium 50 mg    Evaluation Hemodynamic Status: BP stable throughout; O2 sats: stable throughout Patient's Current Condition: stable Complications: No apparent complications Patient did tolerate procedure well. Chest X-ray ordered to verify placement.  CXR: pending.     Richardson Landry Irem Stoneham ACNP Maryanna Shape PCCM Pager [phone] till 3 pm If no answer page [phone] 01/22/2018, 9:33 AM

## 2018-01-22 NOTE — H&P (Addendum)
History and Physical    Pearl Bents PZW:[id number] DOB: 1945-10-02 DOA: 01/22/2018  PCP: Briscoe Deutscher, DO   Patient coming from: Home   Chief Complaint: SOB   HPI: [name] is a 73 y.o. female with medical history significant for coronary artery disease, insulin-dependent diabetes mellitus, depression with anxiety, chronic kidney disease stage III, chronic anemia, hypertension, chronic 2 L/min supplemental oxygen requirement, and seizure disorder, now presenting to the emergency department with respiratory distress.  Patient reportedly developed dyspnea over the past day and worsened acutely 1 to 2 hours prior to arrival in the ED.  She gives a limited history due to her acute respiratory distress.  She denies any chest pain during this illness.  Has not been coughing much and denies any fevers or chills.  She reported adherence with her medications and denies dietary indiscretion.  ED Course: Upon arrival to the ED, patient is found to be afebrile, saturating 78% on room air, tachypneic, slightly tachycardic, and with stable blood pressure.  EKG features sinus tachycardia with rate 105 and repolarization abnormality.  Chest x-ray is notable for shallow inspiration with bibasilar atelectasis and pneumonia not excluded.  Chemistry panel is notable for glucose of 208, potassium 5.4, bicarbonate 19, and creatinine 1.10, consistent with her apparent baseline.  CBC is notable for stable anemia with hemoglobin 9.4.  Troponin is elevated to 2.63 and BNP is elevated to 2778.  Patient was given 324 mg of aspirin and 40 mg IV Lasix in the ED.  She was placed on BiPAP in the ED.  IV heparin infusion was ordered and cardiology was consulted by the ED physician.  Cardiology recommends a medical admission for further evaluation and management.  Review of Systems:  Unable to complete ROS secondary to patient's clinical condition.  Past Medical History:  Diagnosis Date  . Anxiety   . Coronary artery  disease   . Depression   . Diabetes mellitus    insulin dependent  . Edema of lower extremity   . Esophageal stricture 2008   EGD  . Fatigue   . GERD (gastroesophageal reflux disease)   . Heart murmur   . Hiatal hernia 2008   EGD  . Hyperlipidemia   . Hypertension   . Knee pain   . Morbid obesity ([place])   . Nonproductive cough    chronic  . Orthopnea   . Seizure disorder ([place])   . SOB (shortness of breath)   . Syncope and collapse   . Tremor     Past Surgical History:  Procedure Laterality Date  . ABDOMINAL HYSTERECTOMY    . CARDIAC CATHETERIZATION  03/28/2009  . KNEE ARTHROSCOPY    . TRANSTHORACIC ECHOCARDIOGRAM     showed ef of 65% with no regional wall motion abnormalities     reports that she quit smoking about 47 years ago. She has never used smokeless tobacco. She reports that she does not drink alcohol or use drugs.  Allergies  Allergen Reactions  . Codeine Itching    Family History  Problem Relation Age of Onset  . Heart attack Mother   . Hypertension Mother   . COPD Father   . Heart disease Sister   . Diabetes Brother   . Diabetes Brother   . Prostate cancer Brother   . Kidney disease Brother      Prior to Admission medications   Medication Sig Start Date End Date Taking? Authorizing Provider  acetaminophen (TYLENOL) 325 MG tablet Take 2 tablets (650 mg total)  by mouth every 4 (four) hours as needed for headache or mild pain. 09/12/16   Robbie Lis, MD  albuterol (PROVENTIL HFA;VENTOLIN HFA) 108 (90 Base) MCG/ACT inhaler Inhale 2 puffs into the lungs every 6 (six) hours as needed for wheezing or shortness of breath. 10/31/17   Briscoe Deutscher, DO  ALPRAZolam Duanne Moron) 0.5 MG tablet Take 1 tablet (0.5 mg total) by mouth at bedtime. 10/31/17   Briscoe Deutscher, DO  aspirin 325 MG EC tablet Take 1 tablet (325 mg total) by mouth daily. 10/04/17   Briscoe Deutscher, DO  clopidogrel (PLAVIX) 75 MG tablet TAKE ONE TABLET BY MOUTH DAILY 11/08/17   Briscoe Deutscher, DO    collagenase (SANTYL) ointment Apply 1 application topically daily. 10/14/17   Briscoe Deutscher, DO  furosemide (LASIX) 20 MG tablet TAKE ONE TABLET BY MOUTH TWICE A DAY 12/14/17   Briscoe Deutscher, DO  gabapentin (NEURONTIN) 300 MG capsule TAKE ONE CAPSULE BY MOUTH AT BEDTIME 01/16/18   Briscoe Deutscher, DO  glucose blood (ONETOUCH VERIO) test strip 1 each by Other route daily. And lancets 1/day. Dx code E11.9. 08/26/17   Briscoe Deutscher, DO  insulin aspart (NOVOLOG) 100 UNIT/ML injection Inject 0-9 Units into the skin 3 (three) times daily with meals. Sliding scale CBG 70 - 120: 0 units CBG 121 - 150: 1 unit,  CBG 151 - 200: 2 units,  CBG 201 - 250: 3 units,  CBG 251 - 300: 5 units,  CBG 301 - 350: 7 units,  CBG 351 - 400: 9 units   CBG > 400: 9 units and notify your MD 08/26/17   Briscoe Deutscher, DO  insulin detemir (LEVEMIR) 100 UNIT/ML injection Inject 0.1 mLs (10 Units total) into the skin daily. 08/26/17   Briscoe Deutscher, DO  isosorbide mononitrate (IMDUR) 60 MG 24 hr tablet Take 1 tablet (60 mg total) by mouth daily. 10/04/17   Briscoe Deutscher, DO  levETIRAcetam (KEPPRA) 1000 MG tablet TAKE 1 TABLET BY MOUTH TWICE DAILY 12/13/17   Briscoe Deutscher, DO  loratadine (CLARITIN) 10 MG tablet Take 1 tablet (10 mg total) by mouth daily as needed for allergies. 08/26/17   Briscoe Deutscher, DO  losartan (COZAAR) 25 MG tablet TAKE ONE TABLET BY MOUTH DAILY 11/14/17   Briscoe Deutscher, DO  Melatonin 3 MG TABS Take 1 tablet (3 mg total) by mouth at bedtime. 08/26/17   Briscoe Deutscher, DO  metoprolol tartrate (LOPRESSOR) 50 MG tablet TAKE 1 TABLET BY MOUTH ONCE DAILY 12/13/17   Briscoe Deutscher, DO  ondansetron (ZOFRAN) 4 MG tablet Take 1 tablet (4 mg total) by mouth every 6 (six) hours as needed for nausea or vomiting. 08/26/17   Briscoe Deutscher, DO  oxymetazoline (AFRIN) 0.05 % nasal spray Place 1 spray into both nostrils 2 (two) times daily as needed (nosebleeds). 08/26/17   Briscoe Deutscher, DO  polyethylene glycol (MIRALAX /  GLYCOLAX) packet Take 17 g by mouth daily as needed for mild constipation. 08/26/17   Briscoe Deutscher, DO  potassium chloride (K-DUR) 10 MEQ tablet TAKE ONE TABLET BY MOUTH DAILY 12/14/17   Briscoe Deutscher, DO  potassium chloride (K-DUR,KLOR-CON) 10 MEQ tablet Take 1 tablet (10 mEq total) by mouth daily. 10/04/17   Briscoe Deutscher, DO  rizatriptan (MAXALT) 10 MG tablet TAKE 1 TABLET BY MOUTH DAILY AS NEEDED FOR CHRONIC  PAIN 12/17/17   Briscoe Deutscher, DO  rosuvastatin (CRESTOR) 20 MG tablet Take 1 tablet (20 mg total) by mouth every evening. 10/04/17   Briscoe Deutscher, DO  SANTYL ointment APPLY ONE APPLICATION TOPICALLY DAILY 11/08/17   Briscoe Deutscher, DO  sertraline (ZOLOFT) 50 MG tablet Take 1 tablet (50 mg total) by mouth at bedtime. 10/31/17   Briscoe Deutscher, DO  sodium chloride (OCEAN) 0.65 % SOLN nasal spray Place 2 sprays into both nostrils 3 (three) times daily. 08/26/17   Briscoe Deutscher, DO  traMADol (ULTRAM) 50 MG tablet Take 1 tablet (50 mg total) by mouth every 6 (six) hours as needed. 12/16/17   Briscoe Deutscher, DO    Physical Exam: Vitals:   01/22/18 0151 01/22/18 0245 01/22/18 0300 01/22/18 0322  BP: (!) 157/85 (!) 148/71 103/88   Pulse: (!) 108 (!) 104  (!) 102  Resp: (!) 28 18 (!) 24 16  Temp: (!) 97.5 F (36.4 C)     SpO2: 92% 100%  99%  Weight:      Height:        Constitutional: In acute distress with tachypnea and accessory muscle recruitment, no diaphoresis, no cyanosis  Eyes: PERTLA, lids and conjunctivae normal ENMT: Mucous membranes are moist. Posterior pharynx clear of any exudate or lesions.   Neck: normal, supple, no masses, no thyromegaly Respiratory: Breath sounds diminished bilaterally, no wheezing, no rhonchi. Labored breathing. No pallor or cyanosis.  Cardiovascular: Rate ~120 and regular. Pitting edema to bilateral LE's. Abdomen: No distension, no tenderness, soft. Bowel sounds active.  Musculoskeletal: no clubbing / cyanosis. No joint deformity upper and lower  extremities.  Skin: Infraorbital ecchymosis bilaterally. Warm, dry, well-perfused. Neurologic: No facial asymmetry. Sensation intact. Moving all extremities.  Psychiatric: Alert and oriented to person, place, and situatino. Anxious.    Labs on Admission: I have personally reviewed following labs and imaging studies  CBC: Recent Labs  Lab 01/22/18 0155  WBC 9.5  NEUTROABS 6.3  HGB 9.4*  HCT 32.7*  MCV 101.2*  PLT 836   Basic Metabolic Panel: Recent Labs  Lab 01/22/18 0155  NA 142  K 5.4*  CL 110  CO2 19*  GLUCOSE 208*  BUN 39*  CREATININE 1.10*  CALCIUM 9.4   GFR: Estimated Creatinine Clearance: 49.1 mL/min (A) (by C-G formula based on SCr of 1.1 mg/dL (H)). Liver Function Tests: No results for input(s): AST, ALT, ALKPHOS, BILITOT, PROT, ALBUMIN in the last 168 hours. No results for input(s): LIPASE, AMYLASE in the last 168 hours. No results for input(s): AMMONIA in the last 168 hours. Coagulation Profile: No results for input(s): INR, PROTIME in the last 168 hours. Cardiac Enzymes: Recent Labs  Lab 01/22/18 0155  TROPONINI 2.63*   BNP (last 3 results) No results for input(s): PROBNP in the last 8760 hours. HbA1C: No results for input(s): HGBA1C in the last 72 hours. CBG: No results for input(s): GLUCAP in the last 168 hours. Lipid Profile: No results for input(s): CHOL, HDL, LDLCALC, TRIG, CHOLHDL, LDLDIRECT in the last 72 hours. Thyroid Function Tests: No results for input(s): TSH, T4TOTAL, FREET4, T3FREE, THYROIDAB in the last 72 hours. Anemia Panel: No results for input(s): VITAMINB12, FOLATE, FERRITIN, TIBC, IRON, RETICCTPCT in the last 72 hours. Urine analysis:    Component Value Date/Time   COLORURINE YELLOW 07/23/2017 1142   APPEARANCEUR CLOUDY (A) 07/23/2017 1142   LABSPEC 1.016 07/23/2017 1142   PHURINE 5.0 07/23/2017 1142   GLUCOSEU NEGATIVE 07/23/2017 1142   HGBUR SMALL (A) 07/23/2017 1142   BILIRUBINUR NEGATIVE 07/23/2017 1142    BILIRUBINUR Negative 01/21/2017 1044   KETONESUR NEGATIVE 07/23/2017 1142   PROTEINUR 100 (A) 07/23/2017 1142   UROBILINOGEN  0.2 01/21/2017 1044   UROBILINOGEN 0.2 08/28/2014 1555   NITRITE NEGATIVE 07/23/2017 1142   LEUKOCYTESUR NEGATIVE 07/23/2017 1142   Sepsis Labs: @LABRCNTIP (procalcitonin:4,lacticidven:4) )No results found for this or any previous visit (from the past 240 hour(s)).   Radiological Exams on Admission: Dg Chest Port 1 View  Result Date: 01/22/2018 CLINICAL DATA:  73 year old female with shortness of breath. EXAM: PORTABLE CHEST 1 VIEW COMPARISON:  Chest radiograph dated 08/22/2017 FINDINGS: There is shallow inspiration with bibasilar atelectasis. Pneumonia is not excluded. Clinical correlation is recommended. Mild central and perihilar vascular prominence may represent mild congestion. No pneumothorax. Stable cardiac silhouette. No acute osseous pathology. IMPRESSION: Shallow inspiration with bibasilar atelectasis. Pneumonia is not excluded. Electronically Signed   By: Anner Crete M.D.   On: 01/22/2018 02:07    EKG: Independently reviewed. Sinus tachycardia (rate 105), repolarization abnormality.   Assessment/Plan  1. 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 Elevated troponin; CAD   - Troponin is elevated to 2.63 in ED; she has known CAD but denies any chest pain during the current illness  - Likely a  type II event  - Treated with ASA 324 mg in ED and had IV heparin infusion ordered  - Cardiology is consulting and much appreciated  - Patient reports hx of bleeding with heparin, but appears to have tolerated without incident during admit for NSTEMI last August  - Continue ASA, statin, beta-blocker, check echocardiogram   3. Insulin-dependent DM  - A1c was 7.2% in October 2019  - Managed at home with Levemir and Novolog sliding-scale  - Check CBG's, continue Levemir and Novolog    4. Hypertension  - BP at goal  - Continue losartan and metoprolol as tolerated    5. CKD stage III  - SCr is 1.10 on admission, consistent with her baseline  - Renally-dose medications, monitor chem panels during IV diuresis   6. Depression with anxiety  - Continue Zoloft and Xanax    7. Seizure disorder  - Continue Keppra    8. Anemia  - Hgb is 9.4 on admission, similar to priors  - Likely secondary to chronic disease though MCV is newly elevated  - Check B12 and folate, follow closely while on heparin     DVT prophylaxis: IV heparin infusion  Code Status: Patient undecided, said DNR initially, now wants FULL CODE Family Communication: Discussed with patient  Consults called: cardiology  Admission status: Inpatient     Vianne Bulls, MD [facility] Pager [phone]  If 7PM-7AM, please contact night-coverage [email or web address] Password [email or web address]  01/22/2018, 4:38 AM

## 2018-01-22 NOTE — Progress Notes (Signed)
eLink Physician-Brief Progress Note Patient Name: [name] DOB: 12/27/1945 MRN: [id number]   Date of Service  01/22/2018  HPI/Events of Note  Heparin held this morning in light of bruising periorbitally.  CT head done 01/22/2018 with no acute abnormality.  eICU Interventions  Follow troponin. Restart heparin. Check ABG given hypercapnia from AM ABG.     Intervention Category Major Interventions: Other:  Elsie Lincoln 01/22/2018, 9:06 PM

## 2018-01-22 NOTE — Progress Notes (Signed)
[place] TEAM 1 - Stepdown/ICU TEAM  Vy Badley  HKV:[id number] DOB: [birth year] DOA: 01/22/2018 PCP: Briscoe Deutscher, DO    Brief Narrative:  73 y.o. female w/ a hx of CAD, DM, depression, anxiety, CKD stage III, chronic anemia, HTN, chronic hypoxic resp failure (2 L oxygen requirement), morbid obesity, and seizure disorder who presented to the ED with respiratory distress. Patient reportedly developed dyspnea over the day, and worsened acutely 1 to 2 hours prior to arrival in the ED.  In the ED EKG featured a sinus tachycardia with rate 105. Chest x-ray was notable for shallow inspiration with bibasilar atelectasis and pneumonia not excluded. Troponin was elevated to 2.63 and BNP was elevated to 2778.   Significant Events: 1/19 admit   Subjective: Pt is seen for a f/u visit.    I was contacted by the patient's nurse who reported elevated work of breathing.  Prior to my arrival on the floor the nurse contacted Cardiology, who in turn consulted PCCM.  I arrived in the room prior to PCCM.  The patient indeed was exhibiting significant increased work of breathing but was alert and able to communicate with me.  She was maintaining sats greater than 90% on BiPAP.  I asked her if she would wish to be placed on the mechanical ventilator if this was necessary and she replied in the affirmative.  I asked her again and she confirmed that she understood what I was asking her and that yes she would wish to be intubated and mechanically ventilated if necessary.  Orders were entered to facilitate transfer to the ICU.  Shortly thereafter PCCM arrived and assumed care of the patient.  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; acute on chronic hypoxic respiratory failure   - EF 45-50% on TTE July 2019 - No evidence for infection, no chest pain or evidence for DVT  - reported peripheral edema and recent wt gain  Elevated troponin; CAD   - Troponin is elevated to 2.63 in  ED; she has known CAD but denies any chest pain during the current illness  - Likely a type II event  - Treated with ASA 324 mg in ED and had IV heparin infusion ordered  - Cardiology is consulting - Patient reported hx of bleeding with heparin, but appears to have tolerated without incident during admit for NSTEMI last August   Insulin-dependent DM  - A1c was 7.2% in October 2019  - Managed at home with Levemir and Novolog sliding-scale    Hypertension    CKD stage III  - SCr is 1.10 on admission, consistent with her baseline   Depression with anxiety   Seizure disorder  - Continue Keppra    Anemia  - Hgb is 9.4 on admission, similar to priors   DVT prophylaxis: SCDs  Code Status: FULL CODE Family Communication:  Disposition Plan:   Consultants:  Cardiology PCCM  Antimicrobials:  None  Objective: Blood pressure (!) 146/91, pulse 100, temperature 98.3 F (36.8 C), temperature source Oral, resp. rate (!) 26, height 4\' 11"  (1.499 m), weight 104.4 kg, SpO2 100 %. No intake or output data in the 24 hours ending 01/22/18 0844 Filed Weights   01/22/18 0150 01/22/18 0441  Weight: 100 kg 104.4 kg    Examination: Pt was seen for a f/u visit.    CBC: Recent Labs  Lab 01/22/18 0155  WBC 9.5  NEUTROABS 6.3  HGB 9.4*  HCT 32.7*  MCV 101.2*  PLT  492   Basic Metabolic Panel: Recent Labs  Lab 01/22/18 0155  NA 142  K 5.4*  CL 110  CO2 19*  GLUCOSE 208*  BUN 39*  CREATININE 1.10*  CALCIUM 9.4   GFR: Estimated Creatinine Clearance: 49.4 mL/min (A) (by C-G formula based on SCr of 1.1 mg/dL (H)).  Liver Function Tests: No results for input(s): AST, ALT, ALKPHOS, BILITOT, PROT, ALBUMIN in the last 168 hours. No results for input(s): LIPASE, AMYLASE in the last 168 hours. No results for input(s): AMMONIA in the last 168 hours.  Coagulation Profile: No results for input(s): INR, PROTIME in the last 168 hours.  Cardiac Enzymes: Recent Labs  Lab  01/22/18 0155  TROPONINI 2.63*    HbA1C: Hgb A1c MFr Bld  Date/Time Value Ref Range Status  10/31/2017 10:35 AM 7.6 (H) 4.6 - 6.5 % Final    Comment:    Glycemic Control Guidelines for People with Diabetes:Non Diabetic:  <6%Goal of Therapy: <7%Additional Action Suggested:  >8%   07/22/2017 02:06 PM 7.9 (H) 4.8 - 5.6 % Final    Comment:    (NOTE) Pre diabetes:          5.7%-6.4% Diabetes:              >6.4% Glycemic control for   <7.0% adults with diabetes     CBG: Recent Labs  Lab 01/22/18 0731  GLUCAP 294*    Scheduled Meds: . ALPRAZolam  0.5 mg Oral QHS  . aspirin  81 mg Oral Daily  . furosemide  40 mg Intravenous BID  . gabapentin  300 mg Oral QHS  . heparin  4,000 Units Intravenous Once  . insulin aspart  0-9 Units Subcutaneous Q4H  . insulin detemir  5 Units Subcutaneous QHS  . isosorbide mononitrate  60 mg Oral Daily  . levETIRAcetam  1,000 mg Oral BID  . losartan  25 mg Oral Daily  . metoprolol tartrate  5 mg Intravenous Once  . metoprolol tartrate  50 mg Oral Daily  .  morphine injection  2 mg Intravenous Once  . rosuvastatin  20 mg Oral QPM  . sertraline  50 mg Oral QHS  . sodium chloride flush  3 mL Intravenous Q12H     LOS: 0 days   Time spent: No Charge  Cherene Altes, MD [facility] Office  [phone] Pager - Text Page per Amion as per below:  On-Call/Text Page:      [email or web address]  If 7PM-7AM, please contact night-coverage [email or web address] 01/22/2018, 8:44 AM

## 2018-01-22 NOTE — Consult Note (Signed)
Cardiology Consultation:   Patient ID: [name] MRN: [id number]; DOB: 18-Jun-1945  Admit date: 01/22/2018 Date of Consult: 01/22/2018  Primary Care Provider: Briscoe Deutscher, DO Primary Cardiologist: Wyline Copas [facility])  Patient Profile:   [name] is a 73 y.o. female with a hx of severe AS, HFrEF, CAD, pHTN, HTN, HLD, DM2 who is admitted with acute respiratory failure who is being seen today for the evaluation of HF and elevated troponin at the request of Dr. Roxanne Mins.  History of Present Illness:   [name] is a 73 y.o. female with a hx of severe AS, HFrEF, CAD, pHTN, HTN, HLD, DM2 who is admitted with acute respiratory failure who is being seen today for the evaluation of HF and elevated troponin.  The patient has multiple cardiac comorbidities and has followed previously with [facility] cardiology, although she more recently follows with cardiology at [facility]. She has had multiple hospitalizations for NSTEMI including in 06/2017, at which time she underwent echocardiogram that showed EF 30% and moderate to severe AS with possible low-flow, low-gradient component. LHC performed showed diffuse 3 vessel CAD (including severe LCx diease and RCA complete stent restenosis) that was felt to be similar to prior angiogram from 2017. RHC showed elevated left and right sided filling pressures, and AVA was measured to be 0.5 cm. TAVR assessment was discussed, but ongoing conditioning and medical optimization was recommended.   She was subsequently hospitalized at [facility] in 07/2017 for stroke and in 08/2017 with NSTEMI. Echocardiogram at [facility] in 07/2017 showed EF 45-50% with moderate AS.   She was most recently seen in clinic with Dr. Wyline Copas in September 2019. She reported ongoing LE edema without chest pain.  The patient presented to the [facility] ED today with dyspnea. In the ED, pt initially with BP 157/85, HR 108, O2 in 70s, for which she was placed on BiPAP. Labs included BNP 2778, troponin 2.63, K 5.4, Cr 1.1  (baseline), Hgb 9.4 (baseline). ECG showed sinus tachycardia with QRS 103, ST depressions in V2, V3, and TWI in lateral leads. CXR showed atalectasis with possible PNA. Heparin was started for anticoagulation. She was admitted to hospitalist, and cardiology was consulted.   On my evaluation, the patient is on BiPAP, although she states she does not want to wear it. She provides a very limited history. She states that she has had several days of dyspnea with some lightheadedness but without syncope. She denies any chest pain. She states that her LE edema is stable, but she has gained 3 lbs. She denies any medication noncompliance or increased sodium intake. She states that she is on oxygen full time and also notes that she no longer takes clopidogrel.   I called the patient's husband who reports that the patient has had significant dyspnea over the past day. He states that the patient is private about her mediations and is unsure if she has missed any.  Past Medical History:  Diagnosis Date  . Anxiety   . Coronary artery disease   . Depression   . Diabetes mellitus    insulin dependent  . Edema of lower extremity   . Esophageal stricture 2008   EGD  . Fatigue   . GERD (gastroesophageal reflux disease)   . Heart murmur   . Hiatal hernia 2008   EGD  . Hyperlipidemia   . Hypertension   . Knee pain   . Morbid obesity ([place])   . Nonproductive cough    chronic  . Orthopnea   .  Seizure disorder ([place])   . SOB (shortness of breath)   . Syncope and collapse   . Tremor     Past Surgical History:  Procedure Laterality Date  . ABDOMINAL HYSTERECTOMY    . CARDIAC CATHETERIZATION  03/28/2009  . KNEE ARTHROSCOPY    . TRANSTHORACIC ECHOCARDIOGRAM     showed ef of 65% with no regional wall motion abnormalities     Home Medications:  Prior to Admission medications   Medication Sig Start Date End Date Taking? Authorizing Provider  acetaminophen (TYLENOL) 325 MG tablet Take 2 tablets (650 mg  total) by mouth every 4 (four) hours as needed for headache or mild pain. 09/12/16   Robbie Lis, MD  albuterol (PROVENTIL HFA;VENTOLIN HFA) 108 (90 Base) MCG/ACT inhaler Inhale 2 puffs into the lungs every 6 (six) hours as needed for wheezing or shortness of breath. 10/31/17   Briscoe Deutscher, DO  ALPRAZolam Duanne Moron) 0.5 MG tablet Take 1 tablet (0.5 mg total) by mouth at bedtime. 10/31/17   Briscoe Deutscher, DO  aspirin 325 MG EC tablet Take 1 tablet (325 mg total) by mouth daily. 10/04/17   Briscoe Deutscher, DO  clopidogrel (PLAVIX) 75 MG tablet TAKE ONE TABLET BY MOUTH DAILY 11/08/17   Briscoe Deutscher, DO  collagenase (SANTYL) ointment Apply 1 application topically daily. 10/14/17   Briscoe Deutscher, DO  furosemide (LASIX) 20 MG tablet TAKE ONE TABLET BY MOUTH TWICE A DAY 12/14/17   Briscoe Deutscher, DO  gabapentin (NEURONTIN) 300 MG capsule TAKE ONE CAPSULE BY MOUTH AT BEDTIME 01/16/18   Briscoe Deutscher, DO  glucose blood (ONETOUCH VERIO) test strip 1 each by Other route daily. And lancets 1/day. Dx code E11.9. 08/26/17   Briscoe Deutscher, DO  insulin aspart (NOVOLOG) 100 UNIT/ML injection Inject 0-9 Units into the skin 3 (three) times daily with meals. Sliding scale CBG 70 - 120: 0 units CBG 121 - 150: 1 unit,  CBG 151 - 200: 2 units,  CBG 201 - 250: 3 units,  CBG 251 - 300: 5 units,  CBG 301 - 350: 7 units,  CBG 351 - 400: 9 units   CBG > 400: 9 units and notify your MD 08/26/17   Briscoe Deutscher, DO  insulin detemir (LEVEMIR) 100 UNIT/ML injection Inject 0.1 mLs (10 Units total) into the skin daily. 08/26/17   Briscoe Deutscher, DO  isosorbide mononitrate (IMDUR) 60 MG 24 hr tablet Take 1 tablet (60 mg total) by mouth daily. 10/04/17   Briscoe Deutscher, DO  levETIRAcetam (KEPPRA) 1000 MG tablet TAKE 1 TABLET BY MOUTH TWICE DAILY 12/13/17   Briscoe Deutscher, DO  loratadine (CLARITIN) 10 MG tablet Take 1 tablet (10 mg total) by mouth daily as needed for allergies. 08/26/17   Briscoe Deutscher, DO  losartan (COZAAR) 25 MG tablet  TAKE ONE TABLET BY MOUTH DAILY 11/14/17   Briscoe Deutscher, DO  Melatonin 3 MG TABS Take 1 tablet (3 mg total) by mouth at bedtime. 08/26/17   Briscoe Deutscher, DO  metoprolol tartrate (LOPRESSOR) 50 MG tablet TAKE 1 TABLET BY MOUTH ONCE DAILY 12/13/17   Briscoe Deutscher, DO  ondansetron (ZOFRAN) 4 MG tablet Take 1 tablet (4 mg total) by mouth every 6 (six) hours as needed for nausea or vomiting. 08/26/17   Briscoe Deutscher, DO  oxymetazoline (AFRIN) 0.05 % nasal spray Place 1 spray into both nostrils 2 (two) times daily as needed (nosebleeds). 08/26/17   Briscoe Deutscher, DO  polyethylene glycol (MIRALAX / GLYCOLAX) packet Take 17 g by mouth  daily as needed for mild constipation. 08/26/17   Briscoe Deutscher, DO  potassium chloride (K-DUR) 10 MEQ tablet TAKE ONE TABLET BY MOUTH DAILY 12/14/17   Briscoe Deutscher, DO  potassium chloride (K-DUR,KLOR-CON) 10 MEQ tablet Take 1 tablet (10 mEq total) by mouth daily. 10/04/17   Briscoe Deutscher, DO  rizatriptan (MAXALT) 10 MG tablet TAKE 1 TABLET BY MOUTH DAILY AS NEEDED FOR CHRONIC  PAIN 12/17/17   Briscoe Deutscher, DO  rosuvastatin (CRESTOR) 20 MG tablet Take 1 tablet (20 mg total) by mouth every evening. 10/04/17   Briscoe Deutscher, DO  SANTYL ointment APPLY ONE APPLICATION TOPICALLY DAILY 11/08/17   Briscoe Deutscher, DO  sertraline (ZOLOFT) 50 MG tablet Take 1 tablet (50 mg total) by mouth at bedtime. 10/31/17   Briscoe Deutscher, DO  sodium chloride (OCEAN) 0.65 % SOLN nasal spray Place 2 sprays into both nostrils 3 (three) times daily. 08/26/17   Briscoe Deutscher, DO  traMADol (ULTRAM) 50 MG tablet Take 1 tablet (50 mg total) by mouth every 6 (six) hours as needed. 12/16/17   Briscoe Deutscher, DO    Inpatient Medications: Scheduled Meds: . ALPRAZolam  0.5 mg Oral QHS  . aspirin  81 mg Oral Daily  . furosemide  40 mg Intravenous BID  . gabapentin  300 mg Oral QHS  . heparin  4,000 Units Intravenous Once  . insulin aspart  0-9 Units Subcutaneous Q4H  . insulin detemir  5 Units  Subcutaneous QHS  . isosorbide mononitrate  60 mg Oral Daily  . levETIRAcetam  1,000 mg Oral BID  . losartan  25 mg Oral Daily  . metoprolol tartrate  50 mg Oral Daily  . rosuvastatin  20 mg Oral QPM  . sertraline  50 mg Oral QHS  . sodium chloride flush  3 mL Intravenous Q12H   Continuous Infusions: . sodium chloride    . heparin     PRN Meds:   Allergies:    Allergies  Allergen Reactions  . Codeine Itching    Social History:   Social History   Socioeconomic History  . Marital status: Married    Spouse name: Not on file  . Number of children: 2  . Years of education: 94  . Highest education level: Not on file  Occupational History  . Occupation: Retired  Scientific laboratory technician  . Financial resource strain: Not on file  . Food insecurity:    Worry: Not on file    Inability: Not on file  . Transportation needs:    Medical: Not on file    Non-medical: Not on file  Tobacco Use  . Smoking status: Former Smoker    Last attempt to quit: 06/02/1970    Years since quitting: 47.6  . Smokeless tobacco: Never Used  Substance and Sexual Activity  . Alcohol use: No  . Drug use: No  . Sexual activity: Not on file  Lifestyle  . Physical activity:    Days per week: Not on file    Minutes per session: Not on file  . Stress: Not on file  Relationships  . Social connections:    Talks on phone: Not on file    Gets together: Not on file    Attends religious service: Not on file    Active member of club or organization: Not on file    Attends meetings of clubs or organizations: Not on file    Relationship status: Not on file  . Intimate partner violence:    Fear of current  or ex partner: Not on file    Emotionally abused: Not on file    Physically abused: Not on file    Forced sexual activity: Not on file  Other Topics Concern  . Not on file  Social History Narrative   Lives at home with her husband.   Left-handed.   No caffeine use.    Family History:    Family History    Problem Relation Age of Onset  . Heart attack Mother   . Hypertension Mother   . COPD Father   . Heart disease Sister   . Diabetes Brother   . Diabetes Brother   . Prostate cancer Brother   . Kidney disease Brother      ROS:  Please see the history of present illness.  All other ROS reviewed and negative.     Physical Exam/Data:   Vitals:   01/22/18 0245 01/22/18 0300 01/22/18 0322 01/22/18 0441  BP: (!) 148/71 103/88  (!) 146/91  Pulse: (!) 104  (!) 102 (!) 114  Resp: 18 (!) 24 16 (!) 25  Temp:      SpO2: 100%  99% 100%  Weight:    104.4 kg  Height:    5\' 4"  (1.626 m)   No intake or output data in the 24 hours ending 01/22/18 0514 Last 3 Weights 01/22/2018 01/22/2018 10/31/2017  Weight (lbs) 230 lb 2.6 oz 220 lb 7.4 oz 219 lb 6.4 oz  Weight (kg) 104.4 kg 100 kg 99.519 kg     Body mass index is 39.51 kg/m.  General:  Obese, chronically ill appearing woman in moderate distress HEENT: normal Neck: JVD difficult to assess given use of accessory muscles with breathing Cardiac:  Tachycardic and regular with II/VI systolic murmur Lungs:  Significant increased work of breathing. Crackles and wheezes bilaterally.  Abd: soft, nontender  Ext: 2+ edema with minimal pitting  Musculoskeletal:  No deformities, BUE and BLE strength normal and equal Skin: warm and dry  Neuro:  No focal abnormalities noted Psych:  Distressed  EKG:  The EKG was personally reviewed and demonstrates:  sinus tachycardia with QRS 103, ST depressions in V2, V3, and TWI in lateral leads Telemetry:  Telemetry was personally reviewed and demonstrates:  Sinus tachycardia   Relevant CV Studies:  Echo 07/2017: - Left ventricle: Inferior wall hypokinesis. The cavity size was   mildly dilated. Wall thickness was increased in a pattern of mild   LVH. Systolic function was mildly reduced. The estimated ejection   fraction was in the range of 45% to 50%. Doppler parameters are   consistent with both elevated  ventricular end-diastolic filling   pressure and elevated left atrial filling pressure. - Aortic valve: There was moderate stenosis. There was mild   regurgitation. - Mitral valve: Severely calcified annulus. Moderately thickened,   moderately calcified leaflets . There was mild regurgitation. - Right atrium: The atrium was mildly dilated. - Atrial septum: No defect or patent foramen ovale was identified. - Tricuspid valve: There was moderate regurgitation. - Pulmonary arteries: PA peak pressure: 57 mm Hg (S).  Echo 06/2017 [facility]) Limited follow up echo with Definity ultrasound contrast to evaluate aortic valve gradients and left ventricular function. Severely reduced LV systolic function, EF 60%. Regional wall motion was not assessed completely. Low-gradient aortic stenosis, probably severe. PSV 381 cm/s Mean gradient 31 mmHg Dimensionless index 0.25  LHC 06/2017 [facility]): INDICATION: NSTEMI, Aortic stenosis. 1. There is no change comparing today's angiogram to the angiogram from 2017.  The MI last week was probably caused by capillary level microvascular disease in the setting of CHF and AS. 2. There is in-stent occlusion of the PDA (known previously). 3. There is 95% in-stent stenosis of the OM1. But the outflow vessel is very small and would not benefit from stenting. 4. Elevated right and left heart filling pressures, signifying intravascular volume overload. 5. Reduced cardiac output, 3.3 L/min (CI 1.6). 6. Severe pulmonary hypertension, 85/32 (mean 51) mmHg. 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 7. Severe aortic stenosis, AVA index 0.5.  Angiographic findings  Cardiac Arteries and Lesion Findings LMCA: Abnormal. Distal - 20% LAD: Abnormal. Proximal - 40% Mid - 60% Distal - 30% diffuse Diagonal 1 - 50% mid LCx: Abnormal.Small vessel with minimal outflow due to microvascular disease. Proximal - 95% Mid - 95% OM1 - 95% RCA:  Abnormal.Large, dominant vessel with diffuse calcification. Proximal - 30% Mid - 40% Distal - 40% rPDA - 100% in stent.  Laboratory Data:  Chemistry Recent Labs  Lab 01/22/18 0155  NA 142  K 5.4*  CL 110  CO2 19*  GLUCOSE 208*  BUN 39*  CREATININE 1.10*  CALCIUM 9.4  GFRNONAA 50*  GFRAA 58*  ANIONGAP 13    No results for input(s): PROT, ALBUMIN, AST, ALT, ALKPHOS, BILITOT in the last 168 hours. Hematology Recent Labs  Lab 01/22/18 0155  WBC 9.5  RBC 3.23*  HGB 9.4*  HCT 32.7*  MCV 101.2*  MCH 29.1  MCHC 28.7*  RDW 16.1*  PLT 298   Cardiac Enzymes Recent Labs  Lab 01/22/18 0155  TROPONINI 2.63*   No results for input(s): TROPIPOC in the last 168 hours.  BNP Recent Labs  Lab 01/22/18 0155  BNP 2,778.0*    DDimer No results for input(s): DDIMER in the last 168 hours.  Radiology/Studies:  Dg Chest Port 1 View  Result Date: 01/22/2018 CLINICAL DATA:  73 year old female with shortness of breath. EXAM: PORTABLE CHEST 1 VIEW COMPARISON:  Chest radiograph dated 08/22/2017 FINDINGS: There is shallow inspiration with bibasilar atelectasis. Pneumonia is not excluded. Clinical correlation is recommended. Mild central and perihilar vascular prominence may represent mild congestion. No pneumothorax. Stable cardiac silhouette. No acute osseous pathology. IMPRESSION: Shallow inspiration with bibasilar atelectasis. Pneumonia is not excluded. Electronically Signed   By: Anner Crete M.D.   On: 01/22/2018 02:07    Assessment and Plan:   Acute hypoxic respiratory failure HFrEF The patient has HFrEF with EF most recently in the 45-50, although it has been in the 30s previously. She presents with several days of dyspnea and is hypoxic and volume overloaded, consistent with heart failure exacerbation. It is unclear if she has another component of her respiratory failure including COPD and/or pneumonia. She continues to have increased work of breathing and does not to wear  BiPAP.  At this time, she will need ongoing aggressive diuresis and optimization of her HF. -Continue diuresis with IV furosemide.  -Can restart home metoprolol and losartan when clinically stable and able to take oral medications  -Echocardiogram ordered to assess stability of EF  Elevated troponin CAD The patient has known severe CAD. Medial management was recommended after last LHC in 06/2017. Her troponin is elevated in the ED. ECG shows nonspecific QRS delay and nonspecific ST changes. She denies chest pain. Her elevated troponin is likely due to demand ischemia from HF exacerbation rather than acute plaque rupture, although ACS cannot be excluded at this time. She has been started on heparin  gtt by the ED. -Continue to trend troponin -Empiric ACS treatment reasonable at this time -Continue home ASA (decreasing to 81 mg daily)  -Unclear why patient is not taking her clopidogrel. She has not had recent PCI, but she may benefit from this medication for ongoing secondary prevention. -Continue home rosuvastatin, isosorbide when able to safely take PO  AS Has been described to be moderate on most recent echocardiogram. TAVR has previously been discussed due to prior studies describing low-flow, low-gradient severe AS, although she was not felt to be a good candidate. -Repeat echocardiogram ordered as above    HLD -Continue home rosuvastatin.  For questions or updates, please contact [place] Please consult [email or web address] for contact info under     Signed, Nila Nephew, MD  01/22/2018 5:14 AM

## 2018-01-22 NOTE — Progress Notes (Signed)
CRITICAL VALUE ALERT  Critical Value:  Troponin 10.71  Date & Time Notied:  01/22/2018 10:28  Provider Notified: Dr James Ivanoff  Orders Received/Actions taken: Heparin Drip

## 2018-01-22 NOTE — Consult Note (Signed)
[place] Nurse wound consult note Reason for Consult: moisture associated skin damage (MASD), specifically IAD to buttocks and to left medial thigh and intertriginous dermatitis to three areas of skin folds: the inframammary, abdominal and subpannicular skin folds. Pressure injury (Stage 3, healing) is POA and much improved according to caregiver Wound type:Moisture, pressure Pressure Injury POA: Yes Measurement: Right posterior heel Stage 3 pressure injury:  1cm x 2.5cm x 0.1cm with 80% pink and 20% yellow tissue. Scant serous drainage Intertriginous skin damage secondary to moisture with fungal overgrowth: the most severe area is the abdominal skin fold; the subpannicular and inframammary area while affected are not bleeding. Buttocks with erythema, no lesions, macerated Wound bed:Red, moist Drainage (amount, consistency, odor) As described above Periwound:intact, dry Dressing procedure/placement/frequency: I will provide guidance for topical care of the right heel (collagenase) and the skin folds (InterDry Ag+) as well as the buttocks and left medial thigh (Gerhart's Butt Cream).   [place] nursing team will not follow, but will remain available to this patient, the nursing and medical teams.  Please re-consult if needed. Thanks, Maudie Flakes, MSN, RN, [place], Arther Abbott  Pager# [phone]

## 2018-01-22 NOTE — Progress Notes (Signed)
ANTICOAGULATION CONSULT NOTE - Initial Consult  Pharmacy Consult for Heparin Indication: chest pain/ACS  Allergies  Allergen Reactions  . Codeine Itching    Patient Measurements: Height: 5' (152.4 cm) Weight: 220 lb 7.4 oz (100 kg) IBW/kg (Calculated) : 45.5 Heparin Dosing Weight: 75 kg  Vital Signs: Temp: 97.5 F (36.4 C) (01/19 0151) BP: 103/88 (01/19 0300) Pulse Rate: 104 (01/19 0245)  Labs: Recent Labs    01/22/18 0155  HGB 9.4*  HCT 32.7*  PLT 298  CREATININE 1.10*  TROPONINI 2.63*    Estimated Creatinine Clearance: 49.1 mL/min (A) (by C-G formula based on SCr of 1.1 mg/dL (H)).   Medical History: Past Medical History:  Diagnosis Date  . Anxiety   . Coronary artery disease   . Depression   . Diabetes mellitus    insulin dependent  . Edema of lower extremity   . Esophageal stricture 2008   EGD  . Fatigue   . GERD (gastroesophageal reflux disease)   . Heart murmur   . Hiatal hernia 2008   EGD  . Hyperlipidemia   . Hypertension   . Knee pain   . Morbid obesity ([place])   . Nonproductive cough    chronic  . Orthopnea   . Seizure disorder ([place])   . SOB (shortness of breath)   . Syncope and collapse   . Tremor     Medications:  No current facility-administered medications on file prior to encounter.    Current Outpatient Medications on File Prior to Encounter  Medication Sig Dispense Refill  . acetaminophen (TYLENOL) 325 MG tablet Take 2 tablets (650 mg total) by mouth every 4 (four) hours as needed for headache or mild pain. 30 tablet 0  . albuterol (PROVENTIL HFA;VENTOLIN HFA) 108 (90 Base) MCG/ACT inhaler Inhale 2 puffs into the lungs every 6 (six) hours as needed for wheezing or shortness of breath. 1 Inhaler 3  . ALPRAZolam (XANAX) 0.5 MG tablet Take 1 tablet (0.5 mg total) by mouth at bedtime. 30 tablet 3  . aspirin 325 MG EC tablet Take 1 tablet (325 mg total) by mouth daily. 30 tablet 0  . clopidogrel (PLAVIX) 75 MG tablet TAKE ONE TABLET  BY MOUTH DAILY 30 tablet 0  . collagenase (SANTYL) ointment Apply 1 application topically daily. 30 g 0  . furosemide (LASIX) 20 MG tablet TAKE ONE TABLET BY MOUTH TWICE A DAY 30 tablet 3  . gabapentin (NEURONTIN) 300 MG capsule TAKE ONE CAPSULE BY MOUTH AT BEDTIME 90 capsule 0  . glucose blood (ONETOUCH VERIO) test strip 1 each by Other route daily. And lancets 1/day. Dx code E11.9. 100 each 3  . insulin aspart (NOVOLOG) 100 UNIT/ML injection Inject 0-9 Units into the skin 3 (three) times daily with meals. Sliding scale CBG 70 - 120: 0 units CBG 121 - 150: 1 unit,  CBG 151 - 200: 2 units,  CBG 201 - 250: 3 units,  CBG 251 - 300: 5 units,  CBG 301 - 350: 7 units,  CBG 351 - 400: 9 units   CBG > 400: 9 units and notify your MD 10 mL 11  . insulin detemir (LEVEMIR) 100 UNIT/ML injection Inject 0.1 mLs (10 Units total) into the skin daily. 10 mL 0  . isosorbide mononitrate (IMDUR) 60 MG 24 hr tablet Take 1 tablet (60 mg total) by mouth daily. 30 tablet 0  . levETIRAcetam (KEPPRA) 1000 MG tablet TAKE 1 TABLET BY MOUTH TWICE DAILY 60 tablet 1  . loratadine (  CLARITIN) 10 MG tablet Take 1 tablet (10 mg total) by mouth daily as needed for allergies. 30 tablet 0  . losartan (COZAAR) 25 MG tablet TAKE ONE TABLET BY MOUTH DAILY 30 tablet 6  . Melatonin 3 MG TABS Take 1 tablet (3 mg total) by mouth at bedtime. 30 tablet 0  . metoprolol tartrate (LOPRESSOR) 50 MG tablet TAKE 1 TABLET BY MOUTH ONCE DAILY 30 tablet 1  . ondansetron (ZOFRAN) 4 MG tablet Take 1 tablet (4 mg total) by mouth every 6 (six) hours as needed for nausea or vomiting. 20 tablet 0  . oxymetazoline (AFRIN) 0.05 % nasal spray Place 1 spray into both nostrils 2 (two) times daily as needed (nosebleeds). 30 mL 0  . polyethylene glycol (MIRALAX / GLYCOLAX) packet Take 17 g by mouth daily as needed for mild constipation. 14 each 0  . potassium chloride (K-DUR) 10 MEQ tablet TAKE ONE TABLET BY MOUTH DAILY 30 tablet 3  . potassium chloride  (K-DUR,KLOR-CON) 10 MEQ tablet Take 1 tablet (10 mEq total) by mouth daily. 30 tablet 0  . rizatriptan (MAXALT) 10 MG tablet TAKE 1 TABLET BY MOUTH DAILY AS NEEDED FOR CHRONIC  PAIN 10 tablet 0  . rosuvastatin (CRESTOR) 20 MG tablet Take 1 tablet (20 mg total) by mouth every evening. 30 tablet 0  . SANTYL ointment APPLY ONE APPLICATION TOPICALLY DAILY 90 g 1  . sertraline (ZOLOFT) 50 MG tablet Take 1 tablet (50 mg total) by mouth at bedtime. 90 tablet 3  . sodium chloride (OCEAN) 0.65 % SOLN nasal spray Place 2 sprays into both nostrils 3 (three) times daily. 15 mL 0  . traMADol (ULTRAM) 50 MG tablet Take 1 tablet (50 mg total) by mouth every 6 (six) hours as needed. 30 tablet 0     Assessment: 73 y.o. female with SOB and elevated cardiac markers for heparin Goal of Therapy:  Heparin level 0.3-0.7 units/ml Monitor platelets by anticoagulation protocol: Yes   Plan:  Heparin 4000 units IV bolus, then start heparin 1200 units/hr Check heparin level in 8 hours.   Caryl Pina 01/22/2018,3:11 AM

## 2018-01-22 NOTE — Progress Notes (Signed)
eLink Physician-Brief Progress Note Patient Name: [name] DOB: 1945-09-15 MRN: [id number]   Date of Service  01/22/2018  HPI/Events of Note  Troponin elevated at 10.7.  EKG earlier today with ischemic changes. Ct with no acute bleed.  eICU Interventions  Restart heparin. Follow PTT per protocol. Continue to trend troponin.     Intervention Category Major Interventions: Other:  Elsie Lincoln 01/22/2018, 10:37 PM

## 2018-01-22 NOTE — ED Notes (Signed)
Dr. Roxanne Mins notified on pt.'s elevated Troponin result.

## 2018-01-22 NOTE — Progress Notes (Signed)
Called to the bedside to see Ms. Hacking - she is in acute respiratory distress on BIPAP. She appears to be tiring and stated such. ABG this am on BIPAP shows more of a metabolic acidosis 6.226/33/354/56. Troponin is elevated at 2.63 - high BNP. Patient seen and evaluated - morbidly obese, on BIPAP, diffusely decreased breath sounds, accessory muscle breathing with cheyne-stokes pattern, RRR, 1-2+ LE edema. She has not urinated much with lasix and has little residual on bladder scan. Have notified Dr. Thereasa Solo and PCCM as I feel the patient will likely need intubation and transfer to the ICU.  Cardiology will continue to follow.  CRITICAL CARE TIME: I have spent a total of 35 minutes with patient reviewing hospital notes, telemetry, EKGs, labs and examining the patient as well as establishing an assessment and plan that was discussed with the patient. > 50% of time was spent in direct patient care. The patient is critically ill with multi-organ system failure and requires high complexity decision making for assessment and support, frequent evaluation and titration of therapies, application of advanced monitoring technologies and extensive interpretation of multiple databases.  Pixie Casino, MD, [facility], [place] Director of the [facility]  Card[facility] Diplomate of the American Board of Clinical Lipidology Attending Cardiologist  Direct Dial: [phone]  Fax: [phone]  Website:  [email or web address]

## 2018-01-22 NOTE — Progress Notes (Signed)
Called Critical ABG results to RN

## 2018-01-22 NOTE — Progress Notes (Addendum)
ETT pulled back 2 cm per NP Steve Minor from 21 at the lip to 19 at the lip. Patient stable throughout, no complications.

## 2018-01-22 NOTE — Progress Notes (Signed)
[facility] PCCM  Bruising noted around eyes, discussed with Dr. Thereasa Solo, holding heparin.  Will order CT head.    Roselie Awkward, MD [place] [facility] Pager: [phone] Cell: [phone] If no response, call [phone]

## 2018-01-22 NOTE — Consult Note (Signed)
NAME:  [name], MRN:  [id number], DOB:  1945/12/30, LOS: 0 ADMISSION DATE:  01/22/2018, CONSULTATION DATE: January 22, 2018 REFERRING MD: Dr. Thereasa Solo, CHIEF COMPLAINT: Shortness of breath  Brief History   73 year old female with coronary artery disease and aortic stenosis presented with acute respiratory failure with hypoxemia, pulmonary critical care medicine was consulted for worsening respiratory failure on January 22, 2018.  History of present illness   73 year old female who has known coronary artery disease, diabetes among multiple other medical problems was admitted early in the morning on January 22, 2018 in the setting of acute respiratory failure with hypoxemia.  Upon my arrival she was unable to provide history, history is obtained by talking to other providers as well as reviewing the chart.  It appears that she developed shortness of breath for about 1 to 2 days prior to admission.  In the emergency room she was given IV diuretics, morphine, and noninvasive mechanical ventilation.  She was admitted by the hospitalist service and discussions were held with the patient in regards to advanced life support and she and her husband felt that that would be okay if necessary.  After admission her respiratory failure worsened so pulmonary and critical care medicine was consulted for further evaluation  Past Medical History  Coronary artery disease Aortic stenosis Chronic kidney disease Diabetes mellitus Morbid obesity   Significant Hospital Events   January 19 admission to intensive care unit  Consults:  Cardiology  Procedures:  July 2019 echocardiogram showed mild LVH, LVEF 45 to 50%, moderate aortic valve stenosis, mild aortic valve regurgitation, mitral valve with mild regurgitation, right atrium mildly dilated, PA pressure 57 mmHg  Significant Diagnostic Tests:    Micro Data:    Antimicrobials:     Interim history/subjective:  As above  Objective   Blood  pressure (!) 146/91, pulse 100, temperature 98.3 F (36.8 C), temperature source Oral, resp. rate (!) 26, height _0  (1.499 m), weight 104.4 kg, SpO2 100 %.    FiO2 (%):  [50 %] 50 %  No intake or output data in the 24 hours ending 01/22/18 0916 Filed Weights   01/22/18 0150 01/22/18 0441  Weight: 100 kg 104.4 kg    Examination:  General:  Morbidly obese, in respiratory distress in bed HENT: NCAT OP clear BIPAP in place PULM: Crackles bases, increased work of breathing CV: RRR, loud aortic stenosis murmur GI: BS+, soft, nontender MSK: normal bulk and tone Neuro: confused, can answer some questions and follow commands, moves all four ext well   Resolved Hospital Problem list     Assessment & Plan:  Acute respiratory failure with hypercarbia and hypoxemia due to acute pulmonary edema: -Moved to ICU -Intubate, mechanical ventilation -Ventilator associated pneumonia prevention protocol -Sedation for mechanical ventilation: RA SS score targeted to -1 to -2, PAD protocol, fentanyl infusion, PRN midazolam -Daily wake-up assessment and spontaneous breathing trial  Acute pulmonary edema: -IV furosemide, increase dose to match renal function -Monitor urine output closely  Severe aortic stenosis: -Minimize afterload, monitor hemodynamics -Discuss intervention with cardiology after resolution of acute process  Demand ischemia with coronary artery disease, not consistent with acute coronary syndrome but due to an acute cardiac process: -Treat pulmonary edema as detailed above -Telemetry monitoring -Aspirin -Heparin infusion -monitor Troponin -consider repeat echo: per cardiology -likely needs CVP to monitor hemodynamics after intubation -continue IMDUR for now -continue metoprolol for now  CKD: unclear stage -Monitor BMET and UOP -Replace electrolytes as needed  Macrocytic anemia, no bleeding -  monitor for bleeding -transfuse if Hgb < 7gm/dL   Best practice:  Diet:  NPO Pain/Anxiety/Delirium protocol (if indicated): Yes, as above VAP protocol (if indicated): yes DVT prophylaxis: heparin infusion GI prophylaxis: famotidine Glucose control: monitor, SSI Mobility: bedrest Code Status: full Family Communication: met with family at bedside, discussed with daughter 1/19 AM they confirm full code status; this is consistent with family conversations by hospitalist service earlier Disposition: remain in ICU  Labs   CBC: Recent Labs  Lab 01/22/18 0155  WBC 9.5  NEUTROABS 6.3  HGB 9.4*  HCT 32.7*  MCV 101.2*  PLT 594    Basic Metabolic Panel: Recent Labs  Lab 01/22/18 0155  NA 142  K 5.4*  CL 110  CO2 19*  GLUCOSE 208*  BUN 39*  CREATININE 1.10*  CALCIUM 9.4   GFR: Estimated Creatinine Clearance: 49.4 mL/min (A) (by C-G formula based on SCr of 1.1 mg/dL (H)). Recent Labs  Lab 01/22/18 0155  WBC 9.5    Liver Function Tests: No results for input(s): AST, ALT, ALKPHOS, BILITOT, PROT, ALBUMIN in the last 168 hours. No results for input(s): LIPASE, AMYLASE in the last 168 hours. No results for input(s): AMMONIA in the last 168 hours.  ABG    Component Value Date/Time   PHART 7.192 (LL) 01/22/2018 0640   PCO2ART 51.1 (H) 01/22/2018 0640   PO2ART 132 (H) 01/22/2018 0640   HCO3 18.9 (L) 01/22/2018 0640   TCO2 26 07/21/2017 1604   ACIDBASEDEF 7.9 (H) 01/22/2018 0640   O2SAT 97.8 01/22/2018 0640     Coagulation Profile: No results for input(s): INR, PROTIME in the last 168 hours.  Cardiac Enzymes: Recent Labs  Lab 01/22/18 0155 01/22/18 0736  TROPONINI 2.63* 2.66*    HbA1C: Hgb A1c MFr Bld  Date/Time Value Ref Range Status  10/31/2017 10:35 AM 7.6 (H) 4.6 - 6.5 % Final    Comment:    Glycemic Control Guidelines for People with Diabetes:Non Diabetic:  <6%Goal of Therapy: <7%Additional Action Suggested:  >8%   07/22/2017 02:06 PM 7.9 (H) 4.8 - 5.6 % Final    Comment:    (NOTE) Pre diabetes:          5.7%-6.4% Diabetes:               >6.4% Glycemic control for   <7.0% adults with diabetes     CBG: Recent Labs  Lab 01/22/18 [place] 294*     Critical care time: 35 minutes    Roselie Awkward, MD [place] [facility] Pager: [phone] Cell: [phone] If no response, call [phone]

## 2018-01-22 NOTE — Significant Event (Addendum)
Rapid Response Event Note  Overview:  Called by bedside RN stating pt refusing to wear Bipap with increased WOB and tachycardic in the 130s. Informed RN I was with another pt and awaiting a call back from Md and informed her I would let him know pt status and I will be up there to assess as soon as I am finished with current call.    Initial Focused Assessment: On arrival pt tripoding in bed, RR 36, HR 131, spO2 94% on 4L [place]. Pt in clear respiratory distress and unable to complete sentences. Pt code status change from full code to DNR from admitting Md, pt had requested for RN to call husband and he stated to tell pt to keep the mask on and do whatever needed to help with her breathing. Pt appears anxious and afraid stating she does not want the mask. After multiple conversations pt back and forth on what she wanted [place] to do regarding her code status. Metoprolol ordered for HR, Morphine and Ativan ordered to relax pt in hopes of putting her back on bipap.Eventually pt agreed to place mask back on and stated she would want to be intubated and have all measures taken. Md notified by bedside RN and he stated he will change status back to full code. [place] made aware of pt respiratory status despite being back on bipap her RR continues to be mid 30s and is only able to pull 160 tVol (500s prior). Instructed to get abg and he will notify CCM if gas looks bad. Pt had also received 100mg  Lasix IV and only had 66ml out from [place]. RN notified to bladder scan and cath if needed in order to remove fluid.  Plan of Care (if not transferred): Continue to monitor resp status. Scan bladder and cath if needed. RN instructed to call with and changes or concerns. Told to call if ABG abnormal. Will continue to monitor.   Event Summary:  called at  0527    arrived at  0540   Event ended at [phone]  0730: While looking at pt chart Abg results noted to be 7.19/51/132/18.9. Will have day RN assess pt status.    Sherilyn Dacosta

## 2018-01-22 NOTE — Procedures (Addendum)
Central Venous Catheter Insertion Procedure Note [name] [id number] [birth year]  Procedure: Insertion of Central Venous Catheter Indications: Assessment of intravascular volume, Drug and/or fluid administration and Frequent blood sampling  Procedure Details Consent: Risks of procedure as well as the alternatives and risks of each were explained to the (patient/caregiver).  Consent for procedure obtained. Time Out: Verified patient identification, verified procedure, site/side was marked, verified correct patient position, special equipment/implants available, medications/allergies/relevent history reviewed, required imaging and test results available.  Performed  Maximum sterile technique was used including antiseptics, cap, gloves, gown, hand hygiene, mask and sheet. Skin prep: Chlorhexidine; local anesthetic administered A antimicrobial bonded/coated triple lumen catheter was placed in the left internal jugular vein using the Seldinger technique. Ultrasound guidance used.Yes.   Catheter placed to 20 cm. Blood aspirated via all 3 ports and then flushed x 3. Line sutured x 2 and dressing applied.  Evaluation Blood flow good Complications: No apparent complications Patient did tolerate procedure well. Chest X-ray ordered to verify placement.  CXR: pending.   Left I Clemens Catholic Sydnee Lamour ACNP [facility] P[facility]M Pager [phone] till 1 pm If no answer page [phone] 01/22/2018, 9:36 AM

## 2018-01-23 ENCOUNTER — Inpatient Hospital Stay (HOSPITAL_COMMUNITY): Payer: Medicare Other

## 2018-01-23 DIAGNOSIS — I340 Nonrheumatic mitral (valve) insufficiency: Secondary | ICD-10-CM

## 2018-01-23 DIAGNOSIS — I361 Nonrheumatic tricuspid (valve) insufficiency: Secondary | ICD-10-CM

## 2018-01-23 DIAGNOSIS — J9601 Acute respiratory failure with hypoxia: Secondary | ICD-10-CM

## 2018-01-23 DIAGNOSIS — I351 Nonrheumatic aortic (valve) insufficiency: Secondary | ICD-10-CM

## 2018-01-23 LAB — GLUCOSE, CAPILLARY
GLUCOSE-CAPILLARY: 195 mg/dL — AB (ref 70–99)
Glucose-Capillary: 192 mg/dL — ABNORMAL HIGH (ref 70–99)
Glucose-Capillary: 135 mg/dL — ABNORMAL HIGH (ref 70–99)
Glucose-Capillary: 126 mg/dL — ABNORMAL HIGH (ref 70–99)
Glucose-Capillary: 137 mg/dL — ABNORMAL HIGH (ref 70–99)
Glucose-Capillary: 156 mg/dL — ABNORMAL HIGH (ref 70–99)

## 2018-01-23 LAB — ECHOCARDIOGRAM COMPLETE
Height: 59 in
Weight: 3693.15 oz

## 2018-01-23 LAB — CBC
HCT: 27.2 % — ABNORMAL LOW (ref 36.0–46.0)
Hemoglobin: 8 g/dL — ABNORMAL LOW (ref 12.0–15.0)
MCH: 30.7 pg (ref 26.0–34.0)
MCHC: 29.4 g/dL — ABNORMAL LOW (ref 30.0–36.0)
MCV: 104.2 fL — ABNORMAL HIGH (ref 80.0–100.0)
NRBC: 0 % (ref 0.0–0.2)
Platelets: 241 10*3/uL (ref 150–400)
RBC: 2.61 MIL/uL — AB (ref 3.87–5.11)
RDW: 16.3 % — ABNORMAL HIGH (ref 11.5–15.5)
WBC: 8 10*3/uL (ref 4.0–10.5)

## 2018-01-23 LAB — MAGNESIUM
Magnesium: 2 mg/dL (ref 1.7–2.4)
Magnesium: 2.1 mg/dL (ref 1.7–2.4)

## 2018-01-23 LAB — BASIC METABOLIC PANEL
Anion gap: 10 (ref 5–15)
BUN: 60 mg/dL — ABNORMAL HIGH (ref 8–23)
CO2: 21 mmol/L — ABNORMAL LOW (ref 22–32)
Calcium: 8.6 mg/dL — ABNORMAL LOW (ref 8.9–10.3)
Chloride: 109 mmol/L (ref 98–111)
Creatinine, Ser: 1.74 mg/dL — ABNORMAL HIGH (ref 0.44–1.00)
GFR calc Af Amer: 33 mL/min — ABNORMAL LOW (ref >60)
GFR, EST NON AFRICAN AMERICAN: 29 mL/min — AB (ref >60)
Glucose, Bld: 176 mg/dL — ABNORMAL HIGH (ref 70–99)
POTASSIUM: 5.5 mmol/L — AB (ref 3.5–5.1)
Sodium: 140 mmol/L (ref 135–145)

## 2018-01-23 LAB — PHOSPHORUS
Phosphorus: 5 mg/dL — ABNORMAL HIGH (ref 2.5–4.6)
Phosphorus: 4.2 mg/dL (ref 2.5–4.6)

## 2018-01-23 LAB — NA AND K (SODIUM & POTASSIUM), RAND UR
Potassium Urine: 48 mmol/L
SODIUM UR: 62 mmol/L

## 2018-01-23 LAB — HEPARIN LEVEL (UNFRACTIONATED)
HEPARIN UNFRACTIONATED: 0.48 [IU]/mL (ref 0.30–0.70)
Heparin Unfractionated: 0.46 IU/mL (ref 0.30–0.70)

## 2018-01-23 LAB — TROPONIN I: Troponin I: 21.39 ng/mL (ref <0.03)

## 2018-01-23 MED ORDER — CHLORHEXIDINE GLUCONATE CLOTH 2 % EX PADS
6.0000 | MEDICATED_PAD | Freq: Every day | CUTANEOUS | Status: AC
  Administered 2018-01-24 – 2018-01-28 (×5): 6 via TOPICAL

## 2018-01-23 MED ORDER — NOREPINEPHRINE-SODIUM CHLORIDE 4-0.9 MG/250ML-% IV SOLN
0.0000 ug/min | INTRAVENOUS | Status: DC
  Filled 2018-01-23: qty 250

## 2018-01-23 MED ORDER — ADULT MULTIVITAMIN LIQUID CH
15.0000 mL | Freq: Every day | ORAL | Status: DC
  Administered 2018-01-23 – 2018-01-26 (×4): 15 mL via ORAL
  Filled 2018-01-23 (×4): qty 15

## 2018-01-23 MED ORDER — VITAL HIGH PROTEIN PO LIQD
1000.0000 mL | ORAL | Status: DC
  Administered 2018-01-23 – 2018-01-25 (×3): 1000 mL
  Filled 2018-01-23: qty 1000

## 2018-01-23 MED ORDER — PERFLUTREN LIPID MICROSPHERE
INTRAVENOUS | Status: AC
  Administered 2018-01-23: 4 mL via INTRAVENOUS
  Filled 2018-01-23: qty 10

## 2018-01-23 MED ORDER — SODIUM POLYSTYRENE SULFONATE 15 GM/60ML PO SUSP
15.0000 g | Freq: Once | ORAL | Status: AC
  Administered 2018-01-23: 15 g via ORAL
  Filled 2018-01-23: qty 60

## 2018-01-23 MED ORDER — SERTRALINE HCL 50 MG PO TABS
50.0000 mg | ORAL_TABLET | Freq: Every day | ORAL | Status: DC
  Administered 2018-01-23 – 2018-01-25 (×3): 50 mg
  Filled 2018-01-23 (×3): qty 1

## 2018-01-23 MED ORDER — PERFLUTREN LIPID MICROSPHERE
1.0000 mL | INTRAVENOUS | Status: AC | PRN
  Administered 2018-01-23: 4 mL via INTRAVENOUS
  Filled 2018-01-23: qty 10

## 2018-01-23 MED ORDER — FUROSEMIDE 10 MG/ML IJ SOLN
80.0000 mg | Freq: Two times a day (BID) | INTRAMUSCULAR | Status: DC
  Administered 2018-01-23 – 2018-01-24 (×2): 80 mg via INTRAVENOUS
  Filled 2018-01-23 (×2): qty 8

## 2018-01-23 MED ORDER — CHLORHEXIDINE GLUCONATE CLOTH 2 % EX PADS: 6.0000 | MEDICATED_PAD | Freq: Every day | CUTANEOUS | Status: DC

## 2018-01-23 MED ORDER — GABAPENTIN 250 MG/5ML PO SOLN
300.0000 mg | Freq: Every day | ORAL | Status: DC
  Administered 2018-01-23 – 2018-01-25 (×3): 300 mg
  Filled 2018-01-23 (×4): qty 6

## 2018-01-23 MED ORDER — SODIUM CHLORIDE 0.9 % IV BOLUS
500.0000 mL | Freq: Once | INTRAVENOUS | Status: AC
  Administered 2018-01-23: 500 mL via INTRAVENOUS

## 2018-01-23 MED ORDER — MUPIROCIN 2 % EX OINT
1.0000 "application " | TOPICAL_OINTMENT | Freq: Two times a day (BID) | CUTANEOUS | Status: AC
  Administered 2018-01-23 – 2018-01-27 (×10): 1 via NASAL
  Filled 2018-01-23 (×2): qty 22

## 2018-01-23 MED ORDER — METOPROLOL TARTRATE 25 MG/10 ML ORAL SUSPENSION
50.0000 mg | Freq: Every day | ORAL | Status: DC
  Administered 2018-01-24: 25 mg
  Administered 2018-01-25 – 2018-01-27 (×3): 50 mg
  Filled 2018-01-23 (×5): qty 20

## 2018-01-23 MED ORDER — LEVETIRACETAM 100 MG/ML PO SOLN
1000.0000 mg | Freq: Two times a day (BID) | ORAL | Status: DC
  Administered 2018-01-23 – 2018-01-26 (×7): 1000 mg
  Filled 2018-01-23 (×9): qty 10

## 2018-01-23 MED ORDER — NITROGLYCERIN 2 % TD OINT
0.5000 [in_us] | TOPICAL_OINTMENT | Freq: Three times a day (TID) | TRANSDERMAL | Status: DC
  Filled 2018-01-23: qty 30

## 2018-01-23 MED ORDER — HEPARIN (PORCINE) 25000 UT/250ML-% IV SOLN
1000.0000 [IU]/h | INTRAVENOUS | Status: DC
  Administered 2018-01-23 – 2018-01-24 (×2): 1200 [IU]/h via INTRAVENOUS
  Administered 2018-01-25: 1000 [IU]/h via INTRAVENOUS
  Filled 2018-01-23 (×4): qty 250

## 2018-01-23 MED ORDER — VITAL HIGH PROTEIN PO LIQD: 1000.0000 mL | ORAL | Status: DC

## 2018-01-23 NOTE — Progress Notes (Signed)
eLink Physician-Brief Progress Note Patient Name: [name] DOB: Jun 03, 1945 MRN: [id number]   Date of Service  01/23/2018  HPI/Events of Note  Pt with poor urine output.  BUN and Crea are both trending up, and BP borderline.  Suspect she is intravascularly depleted at this point.   Pt also with hyperkalemia.  eICU Interventions  Check EKG Give 500cc NS bolus. Give kayexalate through the oGT.     Intervention Category Intermediate Interventions: Electrolyte abnormality - evaluation and management;Oliguria - evaluation and management  Elsie Lincoln 01/23/2018, 2:52 AM

## 2018-01-23 NOTE — Progress Notes (Signed)
Potassium 6.0, Urinary output 140 cc in 7 hours , GFR decreased: dicussed with Elink MD

## 2018-01-23 NOTE — Progress Notes (Signed)
NAME:  [name], MRN:  [id number], DOB:  1945-04-02, LOS: 1 ADMISSION DATE:  01/22/2018, CONSULTATION DATE: January 22, 2018 REFERRING MD: Dr. Thereasa Solo, CHIEF COMPLAINT: Shortness of breath  Brief History   This is a 73 year old female with a history of CAD and aortic stenosis who presented with hypoxic respiratory failure, PCCM consulted for worsening respiratory failure on 01/22/18.  History of present illness   This is a 73 year old female with a history of CAD, DM, HFrEF, HTN, HLD, on 2L supplemental oxygen at home, and other medical issues listed below who was admitted the morning of 01/22/18 for acute respiratory failure with hypoxia. All information was gathered from the chart. She had been having shortness of breath for about 1-2 days prior to admission. In the ED she was given IV diuretics, morphine, and noninvasive mechanical ventilation. Labs showed an elevated BNP to 2778, troponin 2.63, K 5.4, and hgb 9.4 (near baseline). EKG showed sinus tachycardia with ST depressions in leads V2, V3, and T wave inversions in lateral leads. Cardiology was consulted and heparin drip was started. She was admitted to the hospital service however her her respiratory status worsened with worsening hypoxia requiring intubation and she was transferred to the ICU.  Central line was placed in her left IJ.  She was also noted to have bruising around her eyes, heparin was held and CT head was ordered. CT head showed no acute findings so heparin restarted.   Past Medical History  Coronary artery disease Aortic stenosis Chronic kidney disease Diabetes mellitus Morbid obesity  Significant Hospital Events   01/22/18 > ICU  Consults:  Cardiology  Procedures:  01/22/18 > Central line placement 01/22/18 > intubated   Significant Diagnostic Tests:  July 2019 echocardiogram showed mild LVH, LVEF 45 to 50%, moderate aortic valve stenosis, mild aortic valve regurgitation, mitral valve with mild regurgitation,  right atrium mildly dilated, PA pressure 57 mmHg  CT head 01/22/18: IMPRESSION: Atrophy and chronic microvascular ischemic change in the white matter. No acute abnormality.  Micro Data:    Antimicrobials:     Interim history/subjective:  Patient was intubated and sedated, no family at bedside however nurse reports that the husband and daughter had been present yesterday. Yesterday she was noted to have bruising around her eyes and CT scan was done. This showed atrophy and chronic changes, no acute findings. Heparin was restarted. She has had poor urinary output, and noted to have hyperkalemia and given kayexelate.   Spoke with daughter and husband at bedside. Daughter reports that Mrs. Gerling had been living with her for the past 5 months until about 2 weeks ago when she went home. The daughter had been helping with her medications however she thinks that the patient was not taking her fluid pills. She also reports that the patient had taken 15 Xanax within the past couple of days. She has been being evaluated for a TAVR however apparently she had decided not to get it. The patient had expressed to the daughter and husband that she did not want any CPR or serious interventions prior to this hospitalization however apparently had changed her mind yesterday when she agreed to be intubated.  They report that she has been having shortenss of breath for the past 2-3 days, and has had some orthopnea. She wears 3L [place] at home.  They report that her dry weight is around 219.   Objective   Blood pressure (!) 108/57, pulse 61, temperature 98.7 F (37.1 C),  temperature source Axillary, resp. rate (!) 25, height 4\' 11"  (1.499 m), weight 104.7 kg, SpO2 98 %. CVP:  [13 mmHg] 13 mmHg  Vent Mode: PRVC FiO2 (%):  [40 %-100 %] 40 % Set Rate:  [14 bmp-26 bmp] 26 bmp Vt Set:  [350 mL] 350 mL PEEP:  [5 cmH20] 5 cmH20 Plateau Pressure:  [18 cmH20-27 cmH20] 18 cmH20   Intake/Output Summary (Last 24 hours) at  01/23/2018 0622 Last data filed at 01/23/2018 0400 Gross per 24 hour  Intake 385.87 ml  Output 360 ml  Net 25.87 ml   Filed Weights   01/22/18 0150 01/22/18 0441 01/23/18 0500  Weight: 100 kg 104.4 kg 104.7 kg    Examination: General: Minimally sedated, opens eyes and intermittently follows commands, intubated,  HENT: Normocephalic, atraumatic Lungs: Bibasilar crackles, on ventilator Cardiovascular: RRR, systolic murmur, JVD difficult to assess Abdomen: Soft, non-tender, no gaurding Extremities: 1+ BL LE edema Neuro: Sedated, intermittently following commands,  GU: Foley in place  Resolved Hospital Problem list     Assessment & Plan:    Hypercarbic and hypoxic respiratory failure 2/2 acute pulmonary edema HFrEF: Echo in July showed EF 45-50% Respiratory acidosis -Initial ABG showed pH 7.192, pCO2 51, and pO2 of 132, she was given bicarbonate, intubated and placed on a ventilator. Follow up ABG showed improvement to pH 7.32, pCO2 46, and pO2 of 197. BNP was significantly elevated, she has had poor diuresis thus far. S/p IV lasix 160 mg IV yesterday.Currently net positive, not much change on repeat CXR. Cr also increased to 1.74. Still appears volume overload on exam and will need further diuresis. CVP was up to 18-19. I think that we need to have further diuresis prior to considering extubation.  -Current vent settings at FiO2 40, PEEP 5, RR 26, TV 350 -Currently intubated -Mechanical ventilation -Fentanyl and PRN midazolam for sedation, RASS score target of -1 to -2 -Daily sedation vacation and SBT -Increase lasix to 80 mg IV BID -Strict I and Os, daily weight -Bladder  -F/u echocardiogram  Aortic stenosis Elevated troponin 2/2 Demand ischemia with CAD, not consistent with ACS -Cardiology on board, appreciate recommendations. She is in the process of being evaluated for a TAVR and needs to be medically optimized before undergoing that procedure.  -Troponin continues to  increase, now up to 21 this morning suggesting a ACS.  -Telemetry monitoring -ASA -Continue Heparin drip -Hold Imdur -Hold metoprolol  CKD: -Cr on admission was 1.1, increased to 1.74. This is likely 2/2 her heart failure exacerbation causing  -Renal u/s, urine Cr and urine Na -Repeat BMP in AM  Hyperkalemia: -Up to 6 this morning, patient was given kayexcelate.  -Repeat BMP showed K of 5.5, will repeat Kayexelate.   Macrocytic anemia: -On admission Hgb 9.4 and MCV 101.2. Today Hgb 8.  -No signs of active bleeding -Transfuse for <7  Diabetes mellitus: -Patient is on Levemir and novolog at home -SSI -Frequent CBGs  Seizure disorder: -On Keppra at home, continue this  Best practice:  Diet: Tube feeds Pain/Anxiety/Delirium protocol (if indicated): Fentanyl and versed VAP protocol (if indicated): Yes DVT prophylaxis: Heparin gtt GI prophylaxis: Famotidine Glucose control: SSI Mobility: Bedrest Code Status: Full code Family Communication: Spoke with husband and daughter Disposition: Remain in ICU  Labs   CBC: Recent Labs  Lab 01/22/18 0155  WBC 9.5  NEUTROABS 6.3  HGB 9.4*  HCT 32.7*  MCV 101.2*  PLT 390    Basic Metabolic Panel: Recent Labs  Lab 01/22/18 0155  01/22/18 0736 01/22/18 2109 01/22/18 2253  NA 142  --   --  135  K 5.4*  --   --  6.0*  CL 110  --   --  106  CO2 19*  --   --  22  GLUCOSE 208*  --   --  256*  BUN 39*  --   --  54*  CREATININE 1.10*  --   --  1.47*  CALCIUM 9.4  --   --  8.7*  MG  --  2.1 2.1  --   PHOS  --  5.2* 4.6  --    GFR: Estimated Creatinine Clearance: 37 mL/min (A) (by C-G formula based on SCr of 1.47 mg/dL (H)). Recent Labs  Lab 01/22/18 0155  WBC 9.5    Liver Function Tests: No results for input(s): AST, ALT, ALKPHOS, BILITOT, PROT, ALBUMIN in the last 168 hours. No results for input(s): LIPASE, AMYLASE in the last 168 hours. No results for input(s): AMMONIA in the last 168 hours.  ABG    Component  Value Date/Time   PHART 7.320 (L) 01/22/2018 2149   PCO2ART 46.1 01/22/2018 2149   PO2ART 197.0 (H) 01/22/2018 2149   HCO3 24.0 01/22/2018 2149   TCO2 25 01/22/2018 2149   ACIDBASEDEF 2.0 01/22/2018 2149   O2SAT 100.0 01/22/2018 2149     Coagulation Profile: Recent Labs  Lab 01/22/18 2109  INR 1.35    Cardiac Enzymes: Recent Labs  Lab 01/22/18 0155 01/22/18 0736 01/22/18 1414 01/22/18 2109  TROPONINI 2.63* 2.66* 3.91* 10.71*    HbA1C: Hgb A1c MFr Bld  Date/Time Value Ref Range Status  10/31/2017 10:35 AM 7.6 (H) 4.6 - 6.5 % Final    Comment:    Glycemic Control Guidelines for People with Diabetes:Non Diabetic:  <6%Goal of Therapy: <7%Additional Action Suggested:  >8%   07/22/2017 02:06 PM 7.9 (H) 4.8 - 5.6 % Final    Comment:    (NOTE) Pre diabetes:          5.7%-6.4% Diabetes:              >6.4% Glycemic control for   <7.0% adults with diabetes     CBG: Recent Labs  Lab 01/22/18 1356 01/22/18 1552 01/22/18 1952 01/22/18 2316 01/23/18 0325  GLUCAP 320* 281* 241* 246* 192*    Review of Systems:   Unable to obtain due to patients mental status.   Past Medical History  She,  has a past medical history of Anxiety, Coronary artery disease, Depression, Diabetes mellitus, Edema of lower extremity, Esophageal stricture (2008), Fatigue, GERD (gastroesophageal reflux disease), Heart murmur, Hiatal hernia (2008), Hyperlipidemia, Hypertension, Knee pain, Morbid obesity ([place]), Nonproductive cough, Orthopnea, Seizure disorder ([place]), SOB (shortness of breath), Syncope and collapse, and Tremor.   Surgical History    Past Surgical History:  Procedure Laterality Date  . ABDOMINAL HYSTERECTOMY    . CARDIAC CATHETERIZATION  03/28/2009  . KNEE ARTHROSCOPY    . TRANSTHORACIC ECHOCARDIOGRAM     showed ef of 65% with no regional wall motion abnormalities     Social History   reports that she quit smoking about 47 years ago. She has never used smokeless tobacco. She  reports that she does not drink alcohol or use drugs.   Family History   Her family history includes COPD in her father; Diabetes in her brother and brother; Heart attack in her mother; Heart disease in her sister; Hypertension in her mother; Kidney disease in her brother; Prostate cancer  in her brother.   Allergies Allergies  Allergen Reactions  . Codeine Itching     Home Medications  Prior to Admission medications   Medication Sig Start Date End Date Taking? Authorizing Provider  acetaminophen (TYLENOL) 500 MG tablet Take 1,000 mg by mouth 2 (two) times daily. For leg pain   Yes [provider]  ALPRAZolam (XANAX) 0.5 MG tablet Take 1 tablet (0.5 mg total) by mouth at bedtime. 10/31/17  Yes Briscoe Deutscher, DO  aspirin 325 MG EC tablet Take 1 tablet (325 mg total) by mouth daily. 10/04/17  Yes Briscoe Deutscher, DO  clopidogrel (PLAVIX) 75 MG tablet TAKE ONE TABLET BY MOUTH DAILY Patient taking differently: Take 75 mg by mouth at bedtime.  11/08/17  Yes Briscoe Deutscher, DO  collagenase (SANTYL) ointment Apply 1 application topically daily. Patient taking differently: Apply 1 application topically daily. Apply to feet 10/14/17  Yes Briscoe Deutscher, DO  furosemide (LASIX) 20 MG tablet TAKE ONE TABLET BY MOUTH TWICE A DAY Patient taking differently: Take 20 mg by mouth daily.  12/14/17  Yes Briscoe Deutscher, DO  gabapentin (NEURONTIN) 300 MG capsule TAKE ONE CAPSULE BY MOUTH AT BEDTIME Patient taking differently: Take 300 mg by mouth at bedtime.  01/16/18  Yes Briscoe Deutscher, DO  insulin detemir (LEVEMIR) 100 unit/ml SOLN Inject 15 Units into the skin at bedtime.   Yes [provider]  isosorbide mononitrate (IMDUR) 60 MG 24 hr tablet Take 1 tablet (60 mg total) by mouth daily. 10/04/17  Yes Briscoe Deutscher, DO  levETIRAcetam (KEPPRA) 1000 MG tablet TAKE 1 TABLET BY MOUTH TWICE DAILY Patient taking differently: Take 1,000 mg by mouth 2 (two) times daily.  12/13/17  Yes Briscoe Deutscher,  DO  losartan (COZAAR) 25 MG tablet TAKE ONE TABLET BY MOUTH DAILY Patient taking differently: Take 25 mg by mouth at bedtime.  11/14/17  Yes Briscoe Deutscher, DO  Melatonin 3 MG TABS Take 1 tablet (3 mg total) by mouth at bedtime. 08/26/17  Yes Briscoe Deutscher, DO  metoprolol tartrate (LOPRESSOR) 50 MG tablet TAKE 1 TABLET BY MOUTH ONCE DAILY Patient taking differently: Take 50 mg by mouth daily.  12/13/17  Yes Briscoe Deutscher, DO  ondansetron (ZOFRAN) 4 MG tablet Take 1 tablet (4 mg total) by mouth every 6 (six) hours as needed for nausea or vomiting. 08/26/17  Yes Briscoe Deutscher, DO  oxymetazoline (AFRIN) 0.05 % nasal spray Place 1 spray into both nostrils 2 (two) times daily as needed (nosebleeds). 08/26/17  Yes Briscoe Deutscher, DO  potassium chloride (K-DUR) 10 MEQ tablet TAKE ONE TABLET BY MOUTH DAILY Patient taking differently: Take 10 mEq by mouth daily.  12/14/17  Yes Briscoe Deutscher, DO  rizatriptan (MAXALT) 10 MG tablet TAKE 1 TABLET BY MOUTH DAILY AS NEEDED FOR CHRONIC  PAIN Patient taking differently: Take 10 mg by mouth daily as needed for migraine.  12/17/17  Yes Briscoe Deutscher, DO  rosuvastatin (CRESTOR) 20 MG tablet Take 1 tablet (20 mg total) by mouth every evening. Patient taking differently: Take 20 mg by mouth at bedtime.  10/04/17  Yes Briscoe Deutscher, DO  sertraline (ZOLOFT) 50 MG tablet Take 1 tablet (50 mg total) by mouth at bedtime. 10/31/17  Yes Briscoe Deutscher, DO  sodium chloride (OCEAN) 0.65 % SOLN nasal spray Place 2 sprays into both nostrils 3 (three) times daily. Patient taking differently: Place 2 sprays into both nostrils 3 (three) times daily as needed for congestion.  08/26/17  Yes Briscoe Deutscher, DO  traMADol Veatrice [place])  50 MG tablet Take 1 tablet (50 mg total) by mouth every 6 (six) hours as needed. Patient taking differently: Take 50 mg by mouth every 6 (six) hours as needed (pain).  12/16/17  Yes Briscoe Deutscher, DO  acetaminophen (TYLENOL) 325 MG tablet Take 2 tablets (650  mg total) by mouth every 4 (four) hours as needed for headache or mild pain. Patient not taking: Reported on 01/22/2018 09/12/16   Robbie Lis, MD  albuterol (PROVENTIL HFA;VENTOLIN HFA) 108 [phone] Base) MCG/ACT inhaler Inhale 2 puffs into the lungs every 6 (six) hours as needed for wheezing or shortness of breath. Patient not taking: Reported on 01/22/2018 10/31/17   Briscoe Deutscher, DO  glucose blood (ONETOUCH VERIO) test strip 1 each by Other route daily. And lancets 1/day. Dx code E11.9. 08/26/17   Briscoe Deutscher, DO  insulin aspart (NOVOLOG) 100 UNIT/ML injection Inject 0-9 Units into the skin 3 (three) times daily with meals. Sliding scale CBG 70 - 120: 0 units CBG 121 - 150: 1 unit,  CBG 151 - 200: 2 units,  CBG 201 - 250: 3 units,  CBG 251 - 300: 5 units,  CBG 301 - 350: 7 units,  CBG 351 - 400: 9 units   CBG > 400: 9 units and notify your MD Patient not taking: Reported on 01/22/2018 08/26/17   Briscoe Deutscher, DO  insulin detemir (LEVEMIR) 100 UNIT/ML injection Inject 0.1 mLs (10 Units total) into the skin daily. Patient not taking: Reported on 01/22/2018 08/26/17   Briscoe Deutscher, DO  loratadine (CLARITIN) 10 MG tablet Take 1 tablet (10 mg total) by mouth daily as needed for allergies. Patient not taking: Reported on 01/22/2018 08/26/17   Briscoe Deutscher, DO  polyethylene glycol [facility] / GLYCOLAX) packet Take 17 g by mouth daily as needed for mild constipation. Patient not taking: Reported on 01/22/2018 08/26/17   Briscoe Deutscher, DO  potassium chloride (K-DUR,KLOR-CON) 10 MEQ tablet Take 1 tablet (10 mEq total) by mouth daily. Patient not taking: Reported on 01/22/2018 10/04/17   Briscoe Deutscher, DO  SANTYL ointment APPLY ONE APPLICATION TOPICALLY DAILY Patient not taking: Reported on 01/22/2018 11/08/17   Briscoe Deutscher, DO        Asencion Noble, M.D. PGY1 Pager [phone] 01/23/2018 8:34 AM

## 2018-01-23 NOTE — Progress Notes (Signed)
ANTICOAGULATION CONSULT NOTE - [place] for Heparin Indication: chest pain/ACS  Allergies  Allergen Reactions  . Codeine Itching    Patient Measurements: Height: 4\' 11"  (149.9 cm) Weight: 230 lb 13.2 oz (104.7 kg) IBW/kg (Calculated) : 43.2 Heparin Dosing Weight: 75 kg  Vital Signs: Temp: 97.9 F (36.6 C) (01/20 1533) Temp Source: Oral (01/20 1533) BP: 118/50 (01/20 1830) Pulse Rate: 69 (01/20 1830)  Labs: Recent Labs    01/22/18 0155  01/22/18 1414 01/22/18 2109 01/22/18 2253 01/23/18 0608 01/23/18 0610 01/23/18 1536  HGB 9.4*  --   --   --   --  8.0*  --   --   HCT 32.7*  --   --   --   --  27.2*  --   --   PLT 298  --   --   --   --  241  --   --   APTT  --   --   --  31  --   --   --   --   LABPROT  --   --   --  16.6*  --   --   --   --   INR  --   --   --  1.35  --   --   --   --   HEPARINUNFRC  --   --   --   --   --   --  0.46 0.48  CREATININE 1.10*  --   --   --  1.47* 1.74*  --   --   TROPONINI 2.63*   < > 3.91* 10.71*  --  21.39*  --   --    < > = values in this interval not displayed.    Estimated Creatinine Clearance: 31.3 mL/min (A) (by C-G formula based on SCr of 1.74 mg/dL (H)).   Medical History: Past Medical History:  Diagnosis Date  . Anxiety   . Coronary artery disease   . Depression   . Diabetes mellitus    insulin dependent  . Edema of lower extremity   . Esophageal stricture 2008   EGD  . Fatigue   . GERD (gastroesophageal reflux disease)   . Heart murmur   . Hiatal hernia 2008   EGD  . Hyperlipidemia   . Hypertension   . Knee pain   . Morbid obesity ([place])   . Nonproductive cough    chronic  . Orthopnea   . Seizure disorder ([place])   . SOB (shortness of breath)   . Syncope and collapse   . Tremor     Medications:  No current facility-administered medications on file prior to encounter.    Current Outpatient Medications on File Prior to Encounter  Medication Sig Dispense Refill  .  acetaminophen (TYLENOL) 500 MG tablet Take 1,000 mg by mouth 2 (two) times daily. For leg pain    . ALPRAZolam (XANAX) 0.5 MG tablet Take 1 tablet (0.5 mg total) by mouth at bedtime. 30 tablet 3  . aspirin 325 MG EC tablet Take 1 tablet (325 mg total) by mouth daily. 30 tablet 0  . clopidogrel (PLAVIX) 75 MG tablet TAKE ONE TABLET BY MOUTH DAILY (Patient taking differently: Take 75 mg by mouth at bedtime. ) 30 tablet 0  . collagenase (SANTYL) ointment Apply 1 application topically daily. (Patient taking differently: Apply 1 application topically daily. Apply to feet) 30 g 0  . furosemide (LASIX) 20 MG tablet TAKE  ONE TABLET BY MOUTH TWICE A DAY (Patient taking differently: Take 20 mg by mouth daily. ) 30 tablet 3  . gabapentin (NEURONTIN) 300 MG capsule TAKE ONE CAPSULE BY MOUTH AT BEDTIME (Patient taking differently: Take 300 mg by mouth at bedtime. ) 90 capsule 0  . insulin detemir (LEVEMIR) 100 unit/ml SOLN Inject 15 Units into the skin at bedtime.    . isosorbide mononitrate (IMDUR) 60 MG 24 hr tablet Take 1 tablet (60 mg total) by mouth daily. 30 tablet 0  . levETIRAcetam (KEPPRA) 1000 MG tablet TAKE 1 TABLET BY MOUTH TWICE DAILY (Patient taking differently: Take 1,000 mg by mouth 2 (two) times daily. ) 60 tablet 1  . losartan (COZAAR) 25 MG tablet TAKE ONE TABLET BY MOUTH DAILY (Patient taking differently: Take 25 mg by mouth at bedtime. ) 30 tablet 6  . Melatonin 3 MG TABS Take 1 tablet (3 mg total) by mouth at bedtime. 30 tablet 0  . metoprolol tartrate (LOPRESSOR) 50 MG tablet TAKE 1 TABLET BY MOUTH ONCE DAILY (Patient taking differently: Take 50 mg by mouth daily. ) 30 tablet 1  . ondansetron (ZOFRAN) 4 MG tablet Take 1 tablet (4 mg total) by mouth every 6 (six) hours as needed for nausea or vomiting. 20 tablet 0  . oxymetazoline (AFRIN) 0.05 % nasal spray Place 1 spray into both nostrils 2 (two) times daily as needed (nosebleeds). 30 mL 0  . potassium chloride (K-DUR) 10 MEQ tablet TAKE ONE  TABLET BY MOUTH DAILY (Patient taking differently: Take 10 mEq by mouth daily. ) 30 tablet 3  . rizatriptan (MAXALT) 10 MG tablet TAKE 1 TABLET BY MOUTH DAILY AS NEEDED FOR CHRONIC  PAIN (Patient taking differently: Take 10 mg by mouth daily as needed for migraine. ) 10 tablet 0  . rosuvastatin (CRESTOR) 20 MG tablet Take 1 tablet (20 mg total) by mouth every evening. (Patient taking differently: Take 20 mg by mouth at bedtime. ) 30 tablet 0  . sertraline (ZOLOFT) 50 MG tablet Take 1 tablet (50 mg total) by mouth at bedtime. 90 tablet 3  . sodium chloride (OCEAN) 0.65 % SOLN nasal spray Place 2 sprays into both nostrils 3 (three) times daily. (Patient taking differently: Place 2 sprays into both nostrils 3 (three) times daily as needed for congestion. ) 15 mL 0  . traMADol (ULTRAM) 50 MG tablet Take 1 tablet (50 mg total) by mouth every 6 (six) hours as needed. (Patient taking differently: Take 50 mg by mouth every 6 (six) hours as needed (pain). ) 30 tablet 0  . acetaminophen (TYLENOL) 325 MG tablet Take 2 tablets (650 mg total) by mouth every 4 (four) hours as needed for headache or mild pain. (Patient not taking: Reported on 01/22/2018) 30 tablet 0  . albuterol (PROVENTIL HFA;VENTOLIN HFA) 108 (90 Base) MCG/ACT inhaler Inhale 2 puffs into the lungs every 6 (six) hours as needed for wheezing or shortness of breath. (Patient not taking: Reported on 01/22/2018) 1 Inhaler 3  . glucose blood (ONETOUCH VERIO) test strip 1 each by Other route daily. And lancets 1/day. Dx code E11.9. 100 each 3  . insulin aspart (NOVOLOG) 100 UNIT/ML injection Inject 0-9 Units into the skin 3 (three) times daily with meals. Sliding scale CBG 70 - 120: 0 units CBG 121 - 150: 1 unit,  CBG 151 - 200: 2 units,  CBG 201 - 250: 3 units,  CBG 251 - 300: 5 units,  CBG 301 - 350: 7 units,  CBG  351 - 400: 9 units   CBG > 400: 9 units and notify your MD (Patient not taking: Reported on 01/22/2018) 10 mL 11  . insulin detemir (LEVEMIR) 100  UNIT/ML injection Inject 0.1 mLs (10 Units total) into the skin daily. (Patient not taking: Reported on 01/22/2018) 10 mL 0  . loratadine (CLARITIN) 10 MG tablet Take 1 tablet (10 mg total) by mouth daily as needed for allergies. (Patient not taking: Reported on 01/22/2018) 30 tablet 0  . polyethylene glycol (MIRALAX / GLYCOLAX) packet Take 17 g by mouth daily as needed for mild constipation. (Patient not taking: Reported on 01/22/2018) 14 each 0  . potassium chloride (K-DUR,KLOR-CON) 10 MEQ tablet Take 1 tablet (10 mEq total) by mouth daily. (Patient not taking: Reported on 01/22/2018) 30 tablet 0  . SANTYL ointment APPLY ONE APPLICATION TOPICALLY DAILY (Patient not taking: Reported on 01/22/2018) 90 g 1     Assessment: 72 yoF on IV heparin for elevated troponins and concern for ACS. Heparin level therapeutic, pltc wnl, Hgb down slightly.  Goal of Therapy:  Heparin level 0.3-0.7 units/ml Monitor platelets by anticoagulation protocol: Yes   Plan:  -Continue heparin 1200 units/hr -Daily heparin level and CBC  Arrie Senate, PharmD, BCPS Clinical Pharmacist Please check AMION for all [place] numbers 01/23/2018

## 2018-01-23 NOTE — Progress Notes (Signed)
Progress Note  Patient Name: [name] Date of Encounter: 01/23/2018  Primary Cardiologist: Rozann Lesches, MD   Subjective   73 year old female with a history of severe aortic stenosis, chronic systolic congestive heart failure, coronary artery disease, pulmonary hypertension, hypertension and hyperlipidemia.  The patient was hospitalized in [facility] in June, 2019.  Heart catheterization revealed severe three-vessel coronary artery disease as well as severe aortic stenosis.  TAVR assessment is in progress but it was recommended that she be optimized medically.  She had respiratory distress yesterday.  It was thought that this was primarily due to heart failure. Troponin levels overnight gone as high as 21.  This suggest that this event was actually an acute coronary syndrome. He is minimally responsive on the ventilator this morning.  Inpatient Medications    Scheduled Meds: . ALPRAZolam  0.5 mg Per Tube QHS  . aspirin  81 mg Per Tube Daily  . chlorhexidine gluconate (MEDLINE KIT)  15 mL Mouth Rinse BID  . Chlorhexidine Gluconate Cloth  6 each Topical Daily  . collagenase   Topical Daily  . feeding supplement (PRO-STAT SUGAR FREE 64)  30 mL Per Tube BID  . feeding supplement (VITAL HIGH PROTEIN)  1,000 mL Per Tube Q24H  . fentaNYL (SUBLIMAZE) injection  50 mcg Intravenous Once  . furosemide  60 mg Intravenous BID  . gabapentin  300 mg Per Tube QHS  . Gerhardt's butt cream   Topical TID  . insulin aspart  0-9 Units Subcutaneous Q4H  . insulin detemir  5 Units Subcutaneous QHS  . isosorbide mononitrate  60 mg Oral Daily  . levETIRAcetam  1,000 mg Per Tube BID  . losartan  25 mg Per Tube Daily  . mouth rinse  15 mL Mouth Rinse 10 times per day  . metoprolol tartrate  50 mg Per Tube Daily  . metoprolol tartrate  5 mg Intravenous Once  .  morphine injection  2 mg Intravenous Once  . rosuvastatin  20 mg Per Tube QPM  . sertraline  50 mg Per Tube QHS  . sodium chloride flush   3 mL Intravenous Q12H   Continuous Infusions: . sodium chloride 10 mL/hr at 01/22/18 1900  . famotidine (PEPCID) IV Stopped (01/23/18 0941)  . fentaNYL infusion INTRAVENOUS 50 mcg/hr (01/23/18 1000)  . heparin 1,200 Units/hr (01/23/18 1000)   PRN Meds: sodium chloride, acetaminophen, docusate, fentaNYL, ipratropium-albuterol, midazolam, midazolam, morphine injection, ondansetron (ZOFRAN) IV, perflutren lipid microspheres (DEFINITY) IV suspension, polyethylene glycol, sodium chloride flush   Vital Signs    Vitals:   01/23/18 0900 01/23/18 0907 01/23/18 0930 01/23/18 1000  BP: (!) 99/50 (!) 99/50 (!) 107/49 (!) 106/44  Pulse: 60 65 60 61  Resp: (!) 26 (!) 26 (!) 23 (!) 26  Temp:      TempSrc:      SpO2: 100% 100% 100% 99%  Weight:      Height:        Intake/Output Summary (Last 24 hours) at 01/23/2018 1034 Last data filed at 01/23/2018 1000 Gross per 24 hour  Intake 1590.73 ml  Output 810 ml  Net 780.73 ml   Last 3 Weights 01/23/2018 01/22/2018 01/22/2018  Weight (lbs) 230 lb 13.2 oz 230 lb 2.6 oz 220 lb 7.4 oz  Weight (kg) 104.7 kg 104.4 kg 100 kg      Telemetry    Sinus brady - Personally Reviewed  ECG     - Personally Reviewed  Physical Exam   GEN:  Elderly  female, she is sedated and on the vent.  Moderately obese Neck: No JVD Cardiac: RRR, crescendo decrescendo 2/6 systolic murmur at the left sternal border. Respiratory:  The ventilator GI:  obese  MS: No edema; No deformity. Neuro:  Nonfocal  Psych: Sedated  Labs    Chemistry Recent Labs  Lab 01/22/18 0155 01/22/18 2253 01/23/18 0608  NA 142 135 140  K 5.4* 6.0* 5.5*  CL 110 106 109  CO2 19* 22 21*  GLUCOSE 208* 256* 176*  BUN 39* 54* 60*  CREATININE 1.10* 1.47* 1.74*  CALCIUM 9.4 8.7* 8.6*  GFRNONAA 50* 35* 29*  GFRAA 58* 41* 33*  ANIONGAP '13 7 10     ' Hematology Recent Labs  Lab 01/22/18 0155 01/23/18 0608  WBC 9.5 8.0  RBC 3.23* 2.61*  HGB 9.4* 8.0*  HCT 32.7* 27.2*  MCV 101.2*  104.2*  MCH 29.1 30.7  MCHC 28.7* 29.4*  RDW 16.1* 16.3*  PLT 298 241    Cardiac Enzymes Recent Labs  Lab 01/22/18 0736 01/22/18 1414 01/22/18 2109 01/23/18 0608  TROPONINI 2.66* 3.91* 10.71* 21.39*   No results for input(s): TROPIPOC in the last 168 hours.   BNP Recent Labs  Lab 01/22/18 0155  BNP 2,778.0*     DDimer No results for input(s): DDIMER in the last 168 hours.   Radiology    Ct Head Wo Contrast  Result Date: 01/22/2018 CLINICAL DATA:  Confusion.  Unexplained bruising around eyes EXAM: CT HEAD WITHOUT CONTRAST TECHNIQUE: Contiguous axial images were obtained from the base of the skull through the vertex without intravenous contrast. COMPARISON:  CT head 07/22/2017 FINDINGS: Brain: Mild atrophy. Chronic microvascular ischemic changes in the white matter bilaterally. Negative for acute infarct, hemorrhage, or mass lesion. No midline shift. Vascular: Negative for hyperdense vessel Skull: Negative Sinuses/Orbits: Paranasal sinuses clear. Normal orbit. No orbital fracture identified. Other: None IMPRESSION: Atrophy and chronic microvascular ischemic change in the white matter. No acute abnormality. Electronically Signed   By: Franchot Gallo M.D.   On: 01/22/2018 13:06   Portable Chest Xray  Result Date: 01/23/2018 CLINICAL DATA:  Endotracheal tube EXAM: PORTABLE CHEST 1 VIEW COMPARISON:  Yesterday FINDINGS: Endotracheal tube tip is at the clavicular heads. Left IJ line with tip at the left brachiocephalic SVC junction. An orogastric tube is coiled in the stomach. Haziness of the bilateral mid and lower chest. Cardiomegaly. No pneumothorax. IMPRESSION: 1. Stable hardware positioning. 2. Unchanged bilateral hazy lower lung opacity likely from edema, atelectasis and pleural fluid. Electronically Signed   By: Monte Fantasia M.D.   On: 01/23/2018 04:15   Dg Chest Port 1 View  Result Date: 01/22/2018 CLINICAL DATA:  Check endotracheal tube placement EXAM: PORTABLE CHEST 1 VIEW  COMPARISON:  01/22/2018 FINDINGS: Endotracheal tube is been withdrawn slightly when compared with the prior exam now lying 2 cm above the carina. It previously measured 12 mm above the carina. Nasogastric catheter is noted coiled within the stomach. Left jugular central line is again seen and stable. Cardiac shadow remains enlarged. Central vascular congestion and pulmonary edema with bilateral pleural effusions is seen. IMPRESSION: Changes consistent with CHF slightly increased from the prior exam. Tubes and lines as described above. Slight interval withdrawal of the endotracheal tube is seen. Electronically Signed   By: Inez Catalina M.D.   On: 01/22/2018 14:27   Portable Chest X-ray  Result Date: 01/22/2018 CLINICAL DATA:  ETT placement EXAM: PORTABLE CHEST 1 VIEW COMPARISON:  January 22, 2018 FINDINGS: An ET tube  is been placed in the interval. The distal tip is 12 mm above the carina. Recommend withdrawing 2 cm. A left IJ has been placed in the interval. No pneumothorax. Cardiomediastinal silhouette is unchanged. Diffuse interstitial opacities suggest pulmonary edema. An NG tube terminates below today's film. No other acute abnormalities. IMPRESSION: 1. The ETT terminates 12 mm above the carina. Recommend withdrawing 2 cm. 2. Mild pulmonary edema. These results will be called to the ordering clinician or representative by the Radiologist Assistant, and communication documented in the PACS or zVision Dashboard. Electronically Signed   By: Dorise Bullion III M.D   On: 01/22/2018 10:30   Dg Chest Port 1 View  Result Date: 01/22/2018 CLINICAL DATA:  73 year old female with shortness of breath. EXAM: PORTABLE CHEST 1 VIEW COMPARISON:  Chest radiograph dated 08/22/2017 FINDINGS: There is shallow inspiration with bibasilar atelectasis. Pneumonia is not excluded. Clinical correlation is recommended. Mild central and perihilar vascular prominence may represent mild congestion. No pneumothorax. Stable cardiac  silhouette. No acute osseous pathology. IMPRESSION: Shallow inspiration with bibasilar atelectasis. Pneumonia is not excluded. Electronically Signed   By: Anner Crete M.D.   On: 01/22/2018 02:07    Cardiac Studies     Patient Profile     73 y.o. female with known coronary artery disease and aortic stenosis. We are asked to see her following a respiratory arrest.  Assessment & Plan    1.  Acute hypoxic respiratory failure: Patient has known chronic systolic congestive heart failure.  She now presents with respiratory failure and an elevated troponin level that is likely due to an acute coronary syndrome. Keep her on the vent today.  Might be able to wean her tomorrow.  2.  Coronary artery disease: Her troponin level is 21.  This is consistent with an acute coronary syndrome.  Echocardiogram was just performed. Apparently had stopped her Plavix.  It is unclear why she stopped her Plavix. She is intubated and has a nasogastric tube.  We will change the isosorbide to Nitropaste till she is able to eat again.  3.  Aortic stenosis: The patient has moderate to severe aortic stenosis.  TAVR consultation is underway the patient needed to be tuned up from a medical standpoint.        For questions or updates, please contact [place] Please consult [email or web address] for contact info under        Signed, Mertie Moores, MD  01/23/2018, 10:34 AM

## 2018-01-23 NOTE — Progress Notes (Signed)
Initial Nutrition Assessment  DOCUMENTATION CODES:   Morbid obesity  INTERVENTION:   -Vital High Protein @ 55 ml/hr (1320 ml) via OGT -Liquid MVI  Provides: 1320 kcals, 116 grams protein, 1104 ml free water. Meets 100% of needs.   Recommend supplementation of Vitamin C- 500 mg BID  NUTRITION DIAGNOSIS:   Increased nutrient needs related to wound healing as evidenced by estimated needs.  GOAL:   Patient will meet greater than or equal to 90% of their needs  MONITOR:   Diet advancement, Vent status, Skin, TF tolerance, Weight trends, Labs, I & O's  REASON FOR ASSESSMENT:   Consult Enteral/tube feeding initiation and management  ASSESSMENT:   Patient with PMH significant for CAD, depression, DM, GERD, HLD, HTN, seizure disorder, and CKD III. Presents this admission with acute exacerbation of CHF.    1/19- pulmonary edema- intubated   Spoke with RN. Pt given IV lasix, UOP slow to increase at this time. No plans to extubate today. No family at bedside to provide history. Per records, pt's wt shows to fluctuate throughout the year (unsure of actual dry wt).   Pt started on PEPup protocol over the weekend, tolerating well. Will adjust TF to better meet pts needs (utilize dry weight of 100 kg to estimate needs).   Pt show to have petechiae on her left foot- recommend supplementing Vit C  Patient is currently intubated on ventilator support MV: 10.6 L/min Temp (24hrs), Avg:98.1 F (36.7 C), Min:97.2 F (36.2 C), Max:98.7 F (37.1 C) MAP: 62  I/O: +781 ml since admit UOP: 675 ml x 24 hrs- continues to diurese   Medications reviewed and include: 60 mg lasix BID, SSI, levemir, fentanyl Labs reviewed: K 5.5 (H) Phosphorus 5.0 (H) CBG 135-246   NUTRITION - FOCUSED PHYSICAL EXAM:    Most Recent Value  Orbital Region  No depletion  Upper Arm Region  No depletion  Thoracic and Lumbar Region  Unable to assess  Buccal Region  No depletion  Temple Region  Mild depletion   Clavicle Bone Region  No depletion  Clavicle and Acromion Bone Region  No depletion  Scapular Bone Region  Unable to assess  Dorsal Hand  Unable to assess [mits]  Patellar Region  No depletion  Anterior Thigh Region  No depletion  Posterior Calf Region  Unable to assess  Edema (RD Assessment)  Unable to assess  Hair  Reviewed  Eyes  Unable to assess  Mouth  Unable to assess  Skin  Reviewed  Nails  Unable to assess     Diet Order:   Diet Order            Diet NPO time specified  Diet effective now              EDUCATION NEEDS:   Not appropriate for education at this time  Skin:  Skin Assessment: Skin Integrity Issues: Skin Integrity Issues:: Stage II, Other (Comment) Stage II: right heel, right abdomen  Other: MASD- abdomen, perineum/ Petechiae- left foot  Last BM:  1/18  Height:   Ht Readings from Last 1 Encounters:  01/22/18 4\' 11"  (1.499 m)    Weight:   Wt Readings from Last 1 Encounters:  01/23/18 104.7 kg    Ideal Body Weight:  44.3 kg  BMI:  Body mass index is 46.62 kg/m.  Estimated Nutritional Needs:   Kcal:  1100-1400 kcal  Protein:  90-115 grams  Fluid:  >/= 1.4 L/day   Mariana Single RD, LDN Clinical  Nutrition Pager # - [phone]

## 2018-01-24 LAB — BASIC METABOLIC PANEL WITH GFR
Anion gap: 7 (ref 5–15)
BUN: 73 mg/dL — ABNORMAL HIGH (ref 8–23)
CO2: 24 mmol/L (ref 22–32)
Calcium: 8.5 mg/dL — ABNORMAL LOW (ref 8.9–10.3)
Chloride: 109 mmol/L (ref 98–111)
Creatinine, Ser: 1.75 mg/dL — ABNORMAL HIGH (ref 0.44–1.00)
GFR calc Af Amer: 33 mL/min — ABNORMAL LOW
GFR calc non Af Amer: 29 mL/min — ABNORMAL LOW
Glucose, Bld: 178 mg/dL — ABNORMAL HIGH (ref 70–99)
Potassium: 5 mmol/L (ref 3.5–5.1)
Sodium: 140 mmol/L (ref 135–145)

## 2018-01-24 LAB — GLUCOSE, CAPILLARY
Glucose-Capillary: 107 mg/dL — ABNORMAL HIGH (ref 70–99)
Glucose-Capillary: 153 mg/dL — ABNORMAL HIGH (ref 70–99)
Glucose-Capillary: 177 mg/dL — ABNORMAL HIGH (ref 70–99)
Glucose-Capillary: 182 mg/dL — ABNORMAL HIGH (ref 70–99)
Glucose-Capillary: 189 mg/dL — ABNORMAL HIGH (ref 70–99)
Glucose-Capillary: 199 mg/dL — ABNORMAL HIGH (ref 70–99)

## 2018-01-24 LAB — CBC
HCT: 26.5 % — ABNORMAL LOW (ref 36.0–46.0)
Hemoglobin: 8 g/dL — ABNORMAL LOW (ref 12.0–15.0)
MCH: 30.5 pg (ref 26.0–34.0)
MCHC: 30.2 g/dL (ref 30.0–36.0)
MCV: 101.1 fL — ABNORMAL HIGH (ref 80.0–100.0)
Platelets: 228 10*3/uL (ref 150–400)
RBC: 2.62 MIL/uL — ABNORMAL LOW (ref 3.87–5.11)
RDW: 16.5 % — ABNORMAL HIGH (ref 11.5–15.5)
WBC: 11.3 10*3/uL — ABNORMAL HIGH (ref 4.0–10.5)
nRBC: 0 % (ref 0.0–0.2)

## 2018-01-24 LAB — HEPARIN LEVEL (UNFRACTIONATED): Heparin Unfractionated: 0.59 [IU]/mL (ref 0.30–0.70)

## 2018-01-24 MED ORDER — FUROSEMIDE 10 MG/ML IJ SOLN
10.0000 mg/h | INTRAVENOUS | Status: DC
  Administered 2018-01-24 – 2018-01-25 (×2): 10 mg/h via INTRAVENOUS
  Filled 2018-01-24 (×3): qty 25

## 2018-01-24 MED ORDER — FUROSEMIDE 10 MG/ML IJ SOLN
80.0000 mg | Freq: Once | INTRAMUSCULAR | Status: DC
  Filled 2018-01-24 (×2): qty 8

## 2018-01-24 NOTE — Progress Notes (Signed)
ANTICOAGULATION CONSULT NOTE - [facility] for Heparin Indication: chest pain/ACS  Allergies  Allergen Reactions  . Codeine Itching    Patient Measurements: Height: 4\' 11"  (149.9 cm) Weight: 231 lb 7.7 oz (105 kg) IBW/kg (Calculated) : 43.2 Heparin Dosing Weight: 75 kg  Vital Signs: Temp: 100.5 F (38.1 C) (01/21 0800) Temp Source: Axillary (01/21 0800) BP: 128/67 (01/21 1100) Pulse Rate: 84 (01/21 1100)  Labs: Recent Labs    01/22/18 0155  01/22/18 1414 01/22/18 2109 01/22/18 2253 01/23/18 0608 01/23/18 0610 01/23/18 1536 01/24/18 0414  HGB 9.4*  --   --   --   --  8.0*  --   --  8.0*  HCT 32.7*  --   --   --   --  27.2*  --   --  26.5*  PLT 298  --   --   --   --  241  --   --  228  APTT  --   --   --  31  --   --   --   --   --   LABPROT  --   --   --  16.6*  --   --   --   --   --   INR  --   --   --  1.35  --   --   --   --   --   HEPARINUNFRC  --   --   --   --   --   --  0.46 0.48 0.59  CREATININE 1.10*  --   --   --  1.47* 1.74*  --   --  1.75*  TROPONINI 2.63*   < > 3.91* 10.71*  --  21.39*  --   --   --    < > = values in this interval not displayed.    Estimated Creatinine Clearance: 31.1 mL/min (A) (by C-G formula based on SCr of 1.75 mg/dL (H)).   Medical History: Past Medical History:  Diagnosis Date  . Anxiety   . Coronary artery disease   . Depression   . Diabetes mellitus    insulin dependent  . Edema of lower extremity   . Esophageal stricture 2008   EGD  . Fatigue   . GERD (gastroesophageal reflux disease)   . Heart murmur   . Hiatal hernia 2008   EGD  . Hyperlipidemia   . Hypertension   . Knee pain   . Morbid obesity ([place])   . Nonproductive cough    chronic  . Orthopnea   . Seizure disorder ([place])   . SOB (shortness of breath)   . Syncope and collapse   . Tremor     Medications:  No current facility-administered medications on file prior to encounter.    Current Outpatient Medications on File  Prior to Encounter  Medication Sig Dispense Refill  . acetaminophen (TYLENOL) 500 MG tablet Take 1,000 mg by mouth 2 (two) times daily. For leg pain    . ALPRAZolam (XANAX) 0.5 MG tablet Take 1 tablet (0.5 mg total) by mouth at bedtime. 30 tablet 3  . aspirin 325 MG EC tablet Take 1 tablet (325 mg total) by mouth daily. 30 tablet 0  . clopidogrel (PLAVIX) 75 MG tablet TAKE ONE TABLET BY MOUTH DAILY (Patient taking differently: Take 75 mg by mouth at bedtime. ) 30 tablet 0  . collagenase (SANTYL) ointment Apply 1 application topically daily. (Patient taking differently:  Apply 1 application topically daily. Apply to feet) 30 g 0  . furosemide (LASIX) 20 MG tablet TAKE ONE TABLET BY MOUTH TWICE A DAY (Patient taking differently: Take 20 mg by mouth daily. ) 30 tablet 3  . gabapentin (NEURONTIN) 300 MG capsule TAKE ONE CAPSULE BY MOUTH AT BEDTIME (Patient taking differently: Take 300 mg by mouth at bedtime. ) 90 capsule 0  . insulin detemir (LEVEMIR) 100 unit/ml SOLN Inject 15 Units into the skin at bedtime.    . isosorbide mononitrate (IMDUR) 60 MG 24 hr tablet Take 1 tablet (60 mg total) by mouth daily. 30 tablet 0  . levETIRAcetam (KEPPRA) 1000 MG tablet TAKE 1 TABLET BY MOUTH TWICE DAILY (Patient taking differently: Take 1,000 mg by mouth 2 (two) times daily. ) 60 tablet 1  . losartan (COZAAR) 25 MG tablet TAKE ONE TABLET BY MOUTH DAILY (Patient taking differently: Take 25 mg by mouth at bedtime. ) 30 tablet 6  . Melatonin 3 MG TABS Take 1 tablet (3 mg total) by mouth at bedtime. 30 tablet 0  . metoprolol tartrate (LOPRESSOR) 50 MG tablet TAKE 1 TABLET BY MOUTH ONCE DAILY (Patient taking differently: Take 50 mg by mouth daily. ) 30 tablet 1  . ondansetron (ZOFRAN) 4 MG tablet Take 1 tablet (4 mg total) by mouth every 6 (six) hours as needed for nausea or vomiting. 20 tablet 0  . oxymetazoline (AFRIN) 0.05 % nasal spray Place 1 spray into both nostrils 2 (two) times daily as needed (nosebleeds). 30 mL  0  . potassium chloride (K-DUR) 10 MEQ tablet TAKE ONE TABLET BY MOUTH DAILY (Patient taking differently: Take 10 mEq by mouth daily. ) 30 tablet 3  . rizatriptan (MAXALT) 10 MG tablet TAKE 1 TABLET BY MOUTH DAILY AS NEEDED FOR CHRONIC  PAIN (Patient taking differently: Take 10 mg by mouth daily as needed for migraine. ) 10 tablet 0  . rosuvastatin (CRESTOR) 20 MG tablet Take 1 tablet (20 mg total) by mouth every evening. (Patient taking differently: Take 20 mg by mouth at bedtime. ) 30 tablet 0  . sertraline (ZOLOFT) 50 MG tablet Take 1 tablet (50 mg total) by mouth at bedtime. 90 tablet 3  . sodium chloride (OCEAN) 0.65 % SOLN nasal spray Place 2 sprays into both nostrils 3 (three) times daily. (Patient taking differently: Place 2 sprays into both nostrils 3 (three) times daily as needed for congestion. ) 15 mL 0  . traMADol (ULTRAM) 50 MG tablet Take 1 tablet (50 mg total) by mouth every 6 (six) hours as needed. (Patient taking differently: Take 50 mg by mouth every 6 (six) hours as needed (pain). ) 30 tablet 0  . acetaminophen (TYLENOL) 325 MG tablet Take 2 tablets (650 mg total) by mouth every 4 (four) hours as needed for headache or mild pain. (Patient not taking: Reported on 01/22/2018) 30 tablet 0  . albuterol (PROVENTIL HFA;VENTOLIN HFA) 108 (90 Base) MCG/ACT inhaler Inhale 2 puffs into the lungs every 6 (six) hours as needed for wheezing or shortness of breath. (Patient not taking: Reported on 01/22/2018) 1 Inhaler 3  . glucose blood (ONETOUCH VERIO) test strip 1 each by Other route daily. And lancets 1/day. Dx code E11.9. 100 each 3  . insulin aspart (NOVOLOG) 100 UNIT/ML injection Inject 0-9 Units into the skin 3 (three) times daily with meals. Sliding scale CBG 70 - 120: 0 units CBG 121 - 150: 1 unit,  CBG 151 - 200: 2 units,  CBG 201 -  250: 3 units,  CBG 251 - 300: 5 units,  CBG 301 - 350: 7 units,  CBG 351 - 400: 9 units   CBG > 400: 9 units and notify your MD (Patient not taking: Reported on  01/22/2018) 10 mL 11  . insulin detemir (LEVEMIR) 100 UNIT/ML injection Inject 0.1 mLs (10 Units total) into the skin daily. (Patient not taking: Reported on 01/22/2018) 10 mL 0  . loratadine (CLARITIN) 10 MG tablet Take 1 tablet (10 mg total) by mouth daily as needed for allergies. (Patient not taking: Reported on 01/22/2018) 30 tablet 0  . polyethylene glycol (MIRALAX / GLYCOLAX) packet Take 17 g by mouth daily as needed for mild constipation. (Patient not taking: Reported on 01/22/2018) 14 each 0  . potassium chloride (K-DUR,KLOR-CON) 10 MEQ tablet Take 1 tablet (10 mEq total) by mouth daily. (Patient not taking: Reported on 01/22/2018) 30 tablet 0  . SANTYL ointment APPLY ONE APPLICATION TOPICALLY DAILY (Patient not taking: Reported on 01/22/2018) 90 g 1     Assessment: 72 yoF on IV heparin for elevated troponins and concern for ACS. Heparin level therapeutic this AM, pltc wnl, Hgb low but stable. No signs or symptom of bleeding.  Goal of Therapy:  Heparin level 0.3-0.7 units/ml Monitor platelets by anticoagulation protocol: Yes   Plan:  -Continue heparin 1200 units/hr -Daily heparin level and CBC  Thank you for allowing pharmacy to be a part of this patient's care.  Tamela Gammon, PharmD 01/24/2018 11:37 AM PGY-1 Pharmacy Resident Direct Phone: [phone] Please check [email or web address] for unit-specific pharmacist phone numbers

## 2018-01-24 NOTE — Progress Notes (Signed)
Progress Note  Patient Name: Anneke Cundy Date of Encounter: 01/24/2018  Primary Cardiologist: Rozann Lesches, MD   Subjective   73 year old female with a history of severe aortic stenosis, chronic systolic congestive heart failure, coronary artery disease, pulmonary hypertension, hypertension and hyperlipidemia.  The patient was hospitalized in [facility] in June, 2019.  Heart catheterization revealed severe three-vessel coronary artery disease as well as severe aortic stenosis.  TAVR assessment is in progress but it was recommended that she be optimized medically.  She had respiratory distress yesterday.  It was thought that this was primarily due to heart failure. Troponin levels overnight gone as high as 21.  This suggest that this event was actually an acute coronary syndrome.  She is more alert on the vent today  Still unable to come off the vent       Inpatient Medications    Scheduled Meds: . ALPRAZolam  0.5 mg Per Tube QHS  . aspirin  81 mg Per Tube Daily  . chlorhexidine gluconate (MEDLINE KIT)  15 mL Mouth Rinse BID  . Chlorhexidine Gluconate Cloth  6 each Topical Daily  . Chlorhexidine Gluconate Cloth  6 each Topical Q0600  . collagenase   Topical Daily  . fentaNYL (SUBLIMAZE) injection  50 mcg Intravenous Once  . furosemide  80 mg Intravenous BID  . gabapentin  300 mg Per Tube QHS  . Gerhardt's butt cream   Topical TID  . insulin aspart  0-9 Units Subcutaneous Q4H  . insulin detemir  5 Units Subcutaneous QHS  . levETIRAcetam  1,000 mg Per Tube BID  . losartan  25 mg Per Tube Daily  . mouth rinse  15 mL Mouth Rinse 10 times per day  . metoprolol tartrate  50 mg Per Tube Daily  . metoprolol tartrate  5 mg Intravenous Once  .  morphine injection  2 mg Intravenous Once  . multivitamin  15 mL Oral Daily  . mupirocin ointment  1 application Nasal BID  . rosuvastatin  20 mg Per Tube QPM  . sertraline  50 mg Per Tube QHS  . sodium chloride flush  3 mL Intravenous  Q12H   Continuous Infusions: . sodium chloride 10 mL/hr at 01/22/18 1900  . famotidine (PEPCID) IV 20 mg (01/24/18 0955)  . feeding supplement (VITAL HIGH PROTEIN) 55 mL/hr at 01/24/18 0500  . fentaNYL infusion INTRAVENOUS 75 mcg/hr (01/24/18 0600)  . heparin 1,200 Units/hr (01/24/18 0600)  . norepinephrine (LEVOPHED) Adult infusion     PRN Meds: sodium chloride, acetaminophen, docusate, fentaNYL, ipratropium-albuterol, midazolam, midazolam, morphine injection, ondansetron (ZOFRAN) IV, polyethylene glycol, sodium chloride flush   Vital Signs    Vitals:   01/24/18 0700 01/24/18 0800 01/24/18 0844 01/24/18 1000  BP: 119/60 (!) 102/43 (!) 102/43 124/63  Pulse: 87 68 95 (!) 101  Resp: 19 (!) 26 (!) 21 (!) 22  Temp:  (!) 100.5 F (38.1 C)    TempSrc:  Axillary    SpO2: 100% 100% 99% 98%  Weight:      Height:        Intake/Output Summary (Last 24 hours) at 01/24/2018 1040 Last data filed at 01/24/2018 0603 Gross per 24 hour  Intake 1304.33 ml  Output 1800 ml  Net -495.67 ml   Last 3 Weights 01/24/2018 01/23/2018 01/22/2018  Weight (lbs) 231 lb 7.7 oz 230 lb 13.2 oz 230 lb 2.6 oz  Weight (kg) 105 kg 104.7 kg 104.4 kg      Telemetry    Sinus  brady - Personally Reviewed  ECG     - Personally Reviewed  Physical Exam   GEN:  Moderately obese female.  She is more alert today. Neck: No JVD Cardiac:  Right S1-S2.  She has a murmur consistent with aortic gnosis. Respiratory:  She is currently on the ventilator. GI: , Nontender MS: No edema; No deformity. Neuro:   Nonfocal neuro exam Psych: Unable to assess she is on the vent.  Labs    Chemistry Recent Labs  Lab 01/22/18 2253 01/23/18 0608 01/24/18 0414  NA 135 140 140  K 6.0* 5.5* 5.0  CL 106 109 109  CO2 22 21* 24  GLUCOSE 256* 176* 178*  BUN 54* 60* 73*  CREATININE 1.47* 1.74* 1.75*  CALCIUM 8.7* 8.6* 8.5*  GFRNONAA 35* 29* 29*  GFRAA 41* 33* 33*  ANIONGAP _0 Hematology Recent Labs  Lab  01/22/18 0155 01/23/18 0608 01/24/18 0414  WBC 9.5 8.0 11.3*  RBC 3.23* 2.61* 2.62*  HGB 9.4* 8.0* 8.0*  HCT 32.7* 27.2* 26.5*  MCV 101.2* 104.2* 101.1*  MCH 29.1 30.7 30.5  MCHC 28.7* 29.4* 30.2  RDW 16.1* 16.3* 16.5*  PLT 298 241 228    Cardiac Enzymes Recent Labs  Lab 01/22/18 0736 01/22/18 1414 01/22/18 2109 01/23/18 0608  TROPONINI 2.66* 3.91* 10.71* 21.39*   No results for input(s): TROPIPOC in the last 168 hours.   BNP Recent Labs  Lab 01/22/18 0155  BNP 2,778.0*     DDimer No results for input(s): DDIMER in the last 168 hours.   Radiology    Ct Head Wo Contrast  Result Date: 01/22/2018 CLINICAL DATA:  Confusion.  Unexplained bruising around eyes EXAM: CT HEAD WITHOUT CONTRAST TECHNIQUE: Contiguous axial images were obtained from the base of the skull through the vertex without intravenous contrast. COMPARISON:  CT head 07/22/2017 FINDINGS: Brain: Mild atrophy. Chronic microvascular ischemic changes in the white matter bilaterally. Negative for acute infarct, hemorrhage, or mass lesion. No midline shift. Vascular: Negative for hyperdense vessel Skull: Negative Sinuses/Orbits: Paranasal sinuses clear. Normal orbit. No orbital fracture identified. Other: None IMPRESSION: Atrophy and chronic microvascular ischemic change in the white matter. No acute abnormality. Electronically Signed   By: Franchot Gallo M.D.   On: 01/22/2018 13:06   Portable Chest Xray  Result Date: 01/23/2018 CLINICAL DATA:  Endotracheal tube EXAM: PORTABLE CHEST 1 VIEW COMPARISON:  Yesterday FINDINGS: Endotracheal tube tip is at the clavicular heads. Left IJ line with tip at the left brachiocephalic SVC junction. An orogastric tube is coiled in the stomach. Haziness of the bilateral mid and lower chest. Cardiomegaly. No pneumothorax. IMPRESSION: 1. Stable hardware positioning. 2. Unchanged bilateral hazy lower lung opacity likely from edema, atelectasis and pleural fluid. Electronically Signed   By:  Monte Fantasia M.D.   On: 01/23/2018 04:15   Dg Chest Port 1 View  Result Date: 01/22/2018 CLINICAL DATA:  Check endotracheal tube placement EXAM: PORTABLE CHEST 1 VIEW COMPARISON:  01/22/2018 FINDINGS: Endotracheal tube is been withdrawn slightly when compared with the prior exam now lying 2 cm above the carina. It previously measured 12 mm above the carina. Nasogastric catheter is noted coiled within the stomach. Left jugular central line is again seen and stable. Cardiac shadow remains enlarged. Central vascular congestion and pulmonary edema with bilateral pleural effusions is seen. IMPRESSION: Changes consistent with CHF slightly increased from the prior exam. Tubes and lines as described above. Slight interval withdrawal of the endotracheal tube is seen.  Electronically Signed   By: Inez Catalina M.D.   On: 01/22/2018 14:27    Cardiac Studies     Patient Profile     73 y.o. female with known coronary artery disease and aortic stenosis. We are asked to see her following a respiratory arrest.  Assessment & Plan    1.  Acute hypoxic respiratory failure: Patient has known chronic systolic congestive heart failure.  She now presents with respiratory failure and an elevated troponin level that is likely due to an acute coronary syndrome.  She seems to be making progress but she is still unable to come off the ventilator.  We will need to wait till she is off the vent to further assess her.  Echocardiogram from yesterday reveals moderate LV dysfunction with an ejection fraction of 35%.   Diffuse hypokinesis.  She has grade 2 diastolic dysfunction. Moderate aortic stenosis with an mean aortic valve gradient of 31 mmHg.   2.  Coronary artery disease: Her troponin level is 21.  Will likely need another heart catheterization at some point when she is more stable. .  3.  Aortic stenosis:  Echocardiogram reveals mean aortic valve gradient of 31 mmHg..   For questions or updates, please  contact [facility] Please consult [email or web address] for contact info under        Signed, Mertie Moores, MD  01/24/2018, 10:40 AM

## 2018-01-24 NOTE — Progress Notes (Signed)
NAME:  [name], MRN:  [id number], DOB:  01-Oct-1945, LOS: 2 ADMISSION DATE:  01/22/2018, CONSULTATION DATE: January 22, 2018 REFERRING MD: Dr. Thereasa Solo, CHIEF COMPLAINT: Shortness of breath  Brief History   This is a 73 year old female with a history of CAD and aortic stenosis who presented with hypoxic respiratory failure, PCCM consulted for worsening respiratory failure on 01/22/18.  History of present illness   This is a 73 year old female with a history of CAD, DM, HFrEF, HTN, HLD, on 2L supplemental oxygen at home, and other medical issues listed below who was admitted the morning of 01/22/18 for acute respiratory failure with hypoxia. All information was gathered from the chart. She had been having shortness of breath for about 1-2 days prior to admission. In the ED she was given IV diuretics, morphine, and noninvasive mechanical ventilation. Labs showed an elevated BNP to 2778, troponin 2.63, K 5.4, and hgb 9.4 (near baseline). EKG showed sinus tachycardia with ST depressions in leads V2, V3, and T wave inversions in lateral leads. Cardiology was consulted and heparin drip was started. She was admitted to the hospital service however her her respiratory status worsened with worsening hypoxia requiring intubation and she was transferred to the ICU.  Central line was placed in her left IJ.  She was also noted to have bruising around her eyes, heparin was held and CT head was ordered. CT head showed no acute findings so heparin restarted.   Spoke with daughter and husband at bedside. Daughter reports that [name] had been living with her for the past 5 months until about 2 weeks ago when she went home. The daughter had been helping with her medications however she thinks that the patient was not taking her fluid pills. She also reports that the patient had taken 15 Xanax within the past couple of days. She has been being evaluated for a TAVR however apparently she had decided not to get it. The  patient had expressed to the daughter and husband that she did not want any CPR or serious interventions prior to this hospitalization however apparently had changed her mind yesterday when she agreed to be intubated.  They report that she has been having shortenss of breath for the past 2-3 days, and has had some orthopnea. She wears 3L Brand[place] at home.  They report that her dry weight is around 219.   Past Medical History  Coronary artery disease Aortic stenosis Chronic kidney disease Diabetes mellitus Morbid obesity  Significant Hospital Events   01/22/18 > ICU  Consults:  Cardiology  Procedures:  01/22/18 > Central line placement 01/22/18 > intubated   Significant Diagnostic Tests:  July 2019 echocardiogram showed mild LVH, LVEF 45 to 50%, moderate aortic valve stenosis, mild aortic valve regurgitation, mitral valve with mild regurgitation, right atrium mildly dilated, PA pressure 57 mmHg  CT head 01/22/18: IMPRESSION: Atrophy and chronic microvascular ischemic change in the white matter. No acute abnormality.  Micro Data:    Antimicrobials:     Interim history/subjective:   Patient is currently intubated. Awake and alert today, no family at bedside.  CVP 16  Objective   Blood pressure (!) 116/59, pulse 74, temperature 99.7 F (37.6 C), temperature source Axillary, resp. rate 19, height 4\' 11"  (1.499 m), weight 105 kg, SpO2 100 %.    Vent Mode: PRVC FiO2 (%):  [40 %] 40 % Set Rate:  [26 bmp] 26 bmp Vt Set:  [350 mL] 350 mL PEEP:  [5  [place] Pressure:  [18 cmH20-23 cmH20] 18 cmH20   Intake/Output Summary (Last 24 hours) at 01/24/2018 5573 Last data filed at 01/24/2018 2202 Gross per 24 hour  Intake 1883.28 ml  Output 1935 ml  Net -51.72 ml   Filed Weights   01/22/18 0441 01/23/18 0500 01/24/18 0409  Weight: 104.4 kg 104.7 kg 105 kg    Examination: General: Minimally sedated, opens eyes and follows commands, intubated HENT: Normocephalic,  atraumatic Lungs: Bibasilar crackles, on ventilator Cardiovascular: RRR, systolic murmur, JVD difficult to assess Abdomen: Soft, non-tender, no gaurding Extremities: 1+ BL LE edema Neuro: Sedated, following commands GU: Foley in place   Resolved Hospital Problem list     Assessment & Plan:    Hypercarbic and hypoxic respiratory failure 2/2 acute pulmonary edema HFrEF: Echo in July showed EF 45-50% Respiratory acidosis -Patient was able to be put on pressure support and is tolerating it. She has had poor urine output and it now net equal fluid balance. She received 80 mg IV BID lasix yesterday, kidney function has been stable. We will need to have a net negative fluid balance before we can consider extubating since her primary issue is the acute heart failure exacerbation. CVP today was 16.  -Currently intubated -continue mechanical ventilation -Fentanyl and PRN midazolam for sedation, RASS score target of -1 to -2 -Daily sedation vacation and SBT -Lasix 80 mg IV once, then 10 mg/hr lasix drip -Strict I and Os, daily weight -Bladder scan -Echocardiogram showed EF of 35%, mild LVH, grade 2 diastolic dysfunction, moderate to severe aortic stenosis, diffuse hypokinesis. Dilated IVC.   Aortic stenosis Elevated troponin 2/2 Demand ischemia with CAD, not consistent with ACS -Cardiology on board, appreciate recommendations.  -Troponin elevated to 21, likely an ACS -Telemetry monitoring -ASA -Continue Heparin drip -Hold Imdur -Hold metoprolol  CKD: -Cr on admission was 1.1, increased to 1.74. This is likely 2/2 her heart failure exacerbation.  -Cr today at 1.75 -Repeat BMP in AM  Hyperkalemia: -Improved to 5 today -Daily BMP  Macrocytic anemia: -On admission Hgb 9.4 and MCV 101.2. Today Hgb 8.  -No signs of active bleeding -Transfuse for <7  Diabetes mellitus: -Patient is on Levemir and novolog at home -SSI -Frequent CBGs  Seizure disorder: -On Keppra at home, continue  this  Best practice:  Diet: Tube feeds Pain/Anxiety/Delirium protocol (if indicated): Fentanyl and versed VAP protocol (if indicated): Yes DVT prophylaxis: Heparin gtt GI prophylaxis: Famotidine Glucose control: SSI Mobility: Bedrest Code Status: Full code Family Communication:  Disposition: Remain in ICU  Labs   CBC: Recent Labs  Lab 01/22/18 0155 01/23/18 0608 01/24/18 0414  WBC 9.5 8.0 11.3*  NEUTROABS 6.3  --   --   HGB 9.4* 8.0* 8.0*  HCT 32.7* 27.2* 26.5*  MCV 101.2* 104.2* 101.1*  PLT 298 241 542    Basic Metabolic Panel: Recent Labs  Lab 01/22/18 0155 01/22/18 0736 01/22/18 2109 01/22/18 2253 01/23/18 0608 01/23/18 1536 01/24/18 0414  NA 142  --   --  135 140  --  140  K 5.4*  --   --  6.0* 5.5*  --  5.0  CL 110  --   --  106 109  --  109  CO2 19*  --   --  22 21*  --  24  GLUCOSE 208*  --   --  256* 176*  --  178*  BUN 39*  --   --  54* 60*  --  73*  CREATININE 1.10*  --   --  1.47* 1.74*  --  1.75*  CALCIUM 9.4  --   --  8.7* 8.6*  --  8.5*  MG  --  2.1 2.1  --  2.0 2.1  --   PHOS  --  5.2* 4.6  --  5.0* 4.2  --    GFR: Estimated Creatinine Clearance: 31.1 mL/min (A) (by C-G formula based on SCr of 1.75 mg/dL (H)). Recent Labs  Lab 01/22/18 0155 01/23/18 0608 01/24/18 0414  WBC 9.5 8.0 11.3*    Liver Function Tests: No results for input(s): AST, ALT, ALKPHOS, BILITOT, PROT, ALBUMIN in the last 168 hours. No results for input(s): LIPASE, AMYLASE in the last 168 hours. No results for input(s): AMMONIA in the last 168 hours.  ABG    Component Value Date/Time   PHART 7.320 (L) 01/22/2018 2149   PCO2ART 46.1 01/22/2018 2149   PO2ART 197.0 (H) 01/22/2018 2149   HCO3 24.0 01/22/2018 2149   TCO2 25 01/22/2018 2149   ACIDBASEDEF 2.0 01/22/2018 2149   O2SAT 100.0 01/22/2018 2149     Coagulation Profile: Recent Labs  Lab 01/22/18 2109  INR 1.35    Cardiac Enzymes: Recent Labs  Lab 01/22/18 0155 01/22/18 0736 01/22/18 1414  01/22/18 2109 01/23/18 0608  TROPONINI 2.63* 2.66* 3.91* 10.71* 21.39*    HbA1C: Hgb A1c MFr Bld  Date/Time Value Ref Range Status  10/31/2017 10:35 AM 7.6 (H) 4.6 - 6.5 % Final    Comment:    Glycemic Control Guidelines for People with Diabetes:Non Diabetic:  <6%Goal of Therapy: <7%Additional Action Suggested:  >8%   07/22/2017 02:06 PM 7.9 (H) 4.8 - 5.6 % Final    Comment:    (NOTE) Pre diabetes:          5.7%-6.4% Diabetes:              >6.4% Glycemic control for   <7.0% adults with diabetes     CBG: Recent Labs  Lab 01/23/18 1115 01/23/18 1526 01/23/18 1946 01/23/18 2307 01/24/18 0337  GLUCAP 126* 137* 156* 195* 153*    Review of Systems:   Unable to obtain due to patients mental status.   Past Medical History  She,  has a past medical history of Anxiety, Coronary artery disease, Depression, Diabetes mellitus, Edema of lower extremity, Esophageal stricture (2008), Fatigue, GERD (gastroesophageal reflux disease), Heart murmur, Hiatal hernia (2008), Hyperlipidemia, Hypertension, Knee pain, Morbid obesity ([place]), Nonproductive cough, Orthopnea, Seizure disorder ([place]), SOB (shortness of breath), Syncope and collapse, and Tremor.   Surgical History    Past Surgical History:  Procedure Laterality Date  . ABDOMINAL HYSTERECTOMY    . CARDIAC CATHETERIZATION  03/28/2009  . KNEE ARTHROSCOPY    . TRANSTHORACIC ECHOCARDIOGRAM     showed ef of 65% with no regional wall motion abnormalities     Social History   reports that she quit smoking about 47 years ago. She has never used smokeless tobacco. She reports that she does not drink alcohol or use drugs.   Family History   Her family history includes COPD in her father; Diabetes in her brother and brother; Heart attack in her mother; Heart disease in her sister; Hypertension in her mother; Kidney disease in her brother; Prostate cancer in her brother.   Allergies Allergies  Allergen Reactions  . Codeine Itching      Home Medications  Prior to Admission medications   Medication Sig Start Date End Date Taking? Authorizing Provider  acetaminophen (  TYLENOL) 500 MG tablet Take 1,000 mg by mouth 2 (two) times daily. For leg pain   Yes [provider]  ALPRAZolam (XANAX) 0.5 MG tablet Take 1 tablet (0.5 mg total) by mouth at bedtime. 10/31/17  Yes Briscoe Deutscher, DO  aspirin 325 MG EC tablet Take 1 tablet (325 mg total) by mouth daily. 10/04/17  Yes Briscoe Deutscher, DO  clopidogrel (PLAVIX) 75 MG tablet TAKE ONE TABLET BY MOUTH DAILY Patient taking differently: Take 75 mg by mouth at bedtime.  11/08/17  Yes Briscoe Deutscher, DO  collagenase (SANTYL) ointment Apply 1 application topically daily. Patient taking differently: Apply 1 application topically daily. Apply to feet 10/14/17  Yes Briscoe Deutscher, DO  furosemide (LASIX) 20 MG tablet TAKE ONE TABLET BY MOUTH TWICE A DAY Patient taking differently: Take 20 mg by mouth daily.  12/14/17  Yes Briscoe Deutscher, DO  gabapentin (NEURONTIN) 300 MG capsule TAKE ONE CAPSULE BY MOUTH AT BEDTIME Patient taking differently: Take 300 mg by mouth at bedtime.  01/16/18  Yes Briscoe Deutscher, DO  insulin detemir (LEVEMIR) 100 unit/ml SOLN Inject 15 Units into the skin at bedtime.   Yes [provider]  isosorbide mononitrate (IMDUR) 60 MG 24 hr tablet Take 1 tablet (60 mg total) by mouth daily. 10/04/17  Yes Briscoe Deutscher, DO  levETIRAcetam (KEPPRA) 1000 MG tablet TAKE 1 TABLET BY MOUTH TWICE DAILY Patient taking differently: Take 1,000 mg by mouth 2 (two) times daily.  12/13/17  Yes Briscoe Deutscher, DO  losartan (COZAAR) 25 MG tablet TAKE ONE TABLET BY MOUTH DAILY Patient taking differently: Take 25 mg by mouth at bedtime.  11/14/17  Yes Briscoe Deutscher, DO  Melatonin 3 MG TABS Take 1 tablet (3 mg total) by mouth at bedtime. 08/26/17  Yes Briscoe Deutscher, DO  metoprolol tartrate (LOPRESSOR) 50 MG tablet TAKE 1 TABLET BY MOUTH ONCE DAILY Patient taking differently:  Take 50 mg by mouth daily.  12/13/17  Yes Briscoe Deutscher, DO  ondansetron (ZOFRAN) 4 MG tablet Take 1 tablet (4 mg total) by mouth every 6 (six) hours as needed for nausea or vomiting. 08/26/17  Yes Briscoe Deutscher, DO  oxymetazoline (AFRIN) 0.05 % nasal spray Place 1 spray into both nostrils 2 (two) times daily as needed (nosebleeds). 08/26/17  Yes Briscoe Deutscher, DO  potassium chloride (K-DUR) 10 MEQ tablet TAKE ONE TABLET BY MOUTH DAILY Patient taking differently: Take 10 mEq by mouth daily.  12/14/17  Yes Briscoe Deutscher, DO  rizatriptan (MAXALT) 10 MG tablet TAKE 1 TABLET BY MOUTH DAILY AS NEEDED FOR CHRONIC  PAIN Patient taking differently: Take 10 mg by mouth daily as needed for migraine.  12/17/17  Yes Briscoe Deutscher, DO  rosuvastatin (CRESTOR) 20 MG tablet Take 1 tablet (20 mg total) by mouth every evening. Patient taking differently: Take 20 mg by mouth at bedtime.  10/04/17  Yes Briscoe Deutscher, DO  sertraline (ZOLOFT) 50 MG tablet Take 1 tablet (50 mg total) by mouth at bedtime. 10/31/17  Yes Briscoe Deutscher, DO  sodium chloride (OCEAN) 0.65 % SOLN nasal spray Place 2 sprays into both nostrils 3 (three) times daily. Patient taking differently: Place 2 sprays into both nostrils 3 (three) times daily as needed for congestion.  08/26/17  Yes Briscoe Deutscher, DO  traMADol (ULTRAM) 50 MG tablet Take 1 tablet (50 mg total) by mouth every 6 (six) hours as needed. Patient taking differently: Take 50 mg by mouth every 6 (six) hours as needed (pain).  12/16/17  Yes Briscoe Deutscher,  DO  acetaminophen (TYLENOL) 325 MG tablet Take 2 tablets (650 mg total) by mouth every 4 (four) hours as needed for headache or mild pain. Patient not taking: Reported on 01/22/2018 09/12/16   Robbie Lis, MD  albuterol (PROVENTIL HFA;VENTOLIN HFA) [phone] Base) MCG/ACT inhaler Inhale 2 puffs into the lungs every 6 (six) hours as needed for wheezing or shortness of breath. Patient not taking: Reported on 01/22/2018 10/31/17    Briscoe Deutscher, DO  glucose blood (ONETOUCH VERIO) test strip 1 each by Other route daily. And lancets 1/day. Dx code E11.9. 08/26/17   Briscoe Deutscher, DO  insulin aspart (NOVOLOG) 100 UNIT/ML injection Inject 0-9 Units into the skin 3 (three) times daily with meals. Sliding scale CBG 70 - 120: 0 units CBG 121 - 150: 1 unit,  CBG 151 - 200: 2 units,  CBG 201 - 250: 3 units,  CBG 251 - 300: 5 units,  CBG 301 - 350: 7 units,  CBG 351 - 400: 9 units   CBG > 400: 9 units and notify your MD Patient not taking: Reported on 01/22/2018 08/26/17   Briscoe Deutscher, DO  insulin detemir (LEVEMIR) 100 UNIT/ML injection Inject 0.1 mLs (10 Units total) into the skin daily. Patient not taking: Reported on 01/22/2018 08/26/17   Briscoe Deutscher, DO  loratadine (CLARITIN) 10 MG tablet Take 1 tablet (10 mg total) by mouth daily as needed for allergies. Patient not taking: Reported on 01/22/2018 08/26/17   Briscoe Deutscher, DO  polyethylene glycol [facility] / GLYCOLAX) packet Take 17 g by mouth daily as needed for mild constipation. Patient not taking: Reported on 01/22/2018 08/26/17   Briscoe Deutscher, DO  potassium chloride (K-DUR,KLOR-CON) 10 MEQ tablet Take 1 tablet (10 mEq total) by mouth daily. Patient not taking: Reported on 01/22/2018 10/04/17   Briscoe Deutscher, DO  SANTYL ointment APPLY ONE APPLICATION TOPICALLY DAILY Patient not taking: Reported on 01/22/2018 11/08/17   Briscoe Deutscher, DO        Asencion Noble, M.D. PGY1 Pager [phone] 01/24/2018 6:37 AM

## 2018-01-25 LAB — CBC
HCT: 26.5 % — ABNORMAL LOW (ref 36.0–46.0)
Hemoglobin: 8.1 g/dL — ABNORMAL LOW (ref 12.0–15.0)
MCH: 30.5 pg (ref 26.0–34.0)
MCHC: 30.6 g/dL (ref 30.0–36.0)
MCV: 99.6 fL (ref 80.0–100.0)
Platelets: 199 10*3/uL (ref 150–400)
RBC: 2.66 MIL/uL — ABNORMAL LOW (ref 3.87–5.11)
RDW: 16.3 % — ABNORMAL HIGH (ref 11.5–15.5)
WBC: 11 10*3/uL — ABNORMAL HIGH (ref 4.0–10.5)
nRBC: 0 % (ref 0.0–0.2)

## 2018-01-25 LAB — GLUCOSE, CAPILLARY
GLUCOSE-CAPILLARY: 164 mg/dL — AB (ref 70–99)
GLUCOSE-CAPILLARY: 177 mg/dL — AB (ref 70–99)
Glucose-Capillary: 181 mg/dL — ABNORMAL HIGH (ref 70–99)
Glucose-Capillary: 222 mg/dL — ABNORMAL HIGH (ref 70–99)
Glucose-Capillary: 230 mg/dL — ABNORMAL HIGH (ref 70–99)

## 2018-01-25 LAB — BASIC METABOLIC PANEL
ANION GAP: 10 (ref 5–15)
BUN: 81 mg/dL — ABNORMAL HIGH (ref 8–23)
CO2: 26 mmol/L (ref 22–32)
Calcium: 8.5 mg/dL — ABNORMAL LOW (ref 8.9–10.3)
Chloride: 104 mmol/L (ref 98–111)
Creatinine, Ser: 1.61 mg/dL — ABNORMAL HIGH (ref 0.44–1.00)
GFR calc Af Amer: 37 mL/min — ABNORMAL LOW (ref >60)
GFR calc non Af Amer: 32 mL/min — ABNORMAL LOW (ref >60)
Glucose, Bld: 197 mg/dL — ABNORMAL HIGH (ref 70–99)
POTASSIUM: 4.1 mmol/L (ref 3.5–5.1)
Sodium: 140 mmol/L (ref 135–145)

## 2018-01-25 LAB — HEPARIN LEVEL (UNFRACTIONATED)
Heparin Unfractionated: 0.8 IU/mL — ABNORMAL HIGH (ref 0.30–0.70)
Heparin Unfractionated: 0.53 IU/mL (ref 0.30–0.70)

## 2018-01-25 MED ORDER — DEXAMETHASONE SODIUM PHOSPHATE 4 MG/ML IJ SOLN
10.0000 mg | Freq: Once | INTRAMUSCULAR | Status: AC
  Administered 2018-01-25: 10 mg via INTRAVENOUS
  Filled 2018-01-25: qty 3

## 2018-01-25 MED ORDER — FAMOTIDINE 40 MG/5ML PO SUSR: 20.0000 mg | Freq: Two times a day (BID) | ORAL | Status: DC

## 2018-01-25 MED ORDER — INSULIN DETEMIR 100 UNIT/ML ~~LOC~~ SOLN
8.0000 [IU] | Freq: Every day | SUBCUTANEOUS | Status: DC
  Administered 2018-01-25 – 2018-01-27 (×3): 8 [IU] via SUBCUTANEOUS
  Filled 2018-01-25 (×5): qty 0.08

## 2018-01-25 MED ORDER — FAMOTIDINE 40 MG/5ML PO SUSR
20.0000 mg | Freq: Two times a day (BID) | ORAL | Status: DC
  Administered 2018-01-25 – 2018-01-27 (×3): 20 mg
  Filled 2018-01-25 (×4): qty 2.5

## 2018-01-25 NOTE — Progress Notes (Addendum)
ANTICOAGULATION CONSULT NOTE - Follow Up Consult  Pharmacy Consult for Heparin Indication: chest pain/ACS  Allergies  Allergen Reactions  . Codeine Itching    Patient Measurements: Height: _0  (149.9 cm) Weight: 227 lb 11.8 oz (103.3 kg) IBW/kg (Calculated) : 43.2 Heparin Dosing Weight: 75kg  Vital Signs: Temp: 99.9 F (37.7 C) (01/22 0814) Temp Source: Oral (01/22 0814) BP: 132/59 (01/22 0800) Pulse Rate: 79 (01/22 0800)  Labs: Recent Labs    01/22/18 1414 01/22/18 2109  01/23/18 0608  01/23/18 1536 01/24/18 0414 01/25/18 0440  HGB  --   --    < > 8.0*  --   --  8.0* 8.1*  HCT  --   --   --  27.2*  --   --  26.5* 26.5*  PLT  --   --   --  241  --   --  228 199  APTT  --  31  --   --   --   --   --   --   LABPROT  --  16.6*  --   --   --   --   --   --   INR  --  1.35  --   --   --   --   --   --   HEPARINUNFRC  --   --   --   --    < > 0.48 0.59 0.80*  CREATININE  --   --    < > 1.74*  --   --  1.75* 1.61*  TROPONINI 3.91* 10.71*  --  21.39*  --   --   --   --    < > = values in this interval not displayed.    Estimated Creatinine Clearance: 33.5 mL/min (A) (by C-G formula based on SCr of 1.61 mg/dL (H)).   Medications:  Scheduled:  . ALPRAZolam  0.5 mg Per Tube QHS  . aspirin  81 mg Per Tube Daily  . chlorhexidine gluconate (MEDLINE KIT)  15 mL Mouth Rinse BID  . Chlorhexidine Gluconate Cloth  6 each Topical Q0600  . collagenase   Topical Daily  . fentaNYL (SUBLIMAZE) injection  50 mcg Intravenous Once  . furosemide  80 mg Intravenous Once  . gabapentin  300 mg Per Tube QHS  . Gerhardt's butt cream   Topical TID  . insulin aspart  0-9 Units Subcutaneous Q4H  . insulin detemir  5 Units Subcutaneous QHS  . levETIRAcetam  1,000 mg Per Tube BID  . losartan  25 mg Per Tube Daily  . mouth rinse  15 mL Mouth Rinse 10 times per day  . metoprolol tartrate  50 mg Per Tube Daily  . metoprolol tartrate  5 mg Intravenous Once  .  morphine injection  2 mg  Intravenous Once  . multivitamin  15 mL Oral Daily  . mupirocin ointment  1 application Nasal BID  . rosuvastatin  20 mg Per Tube QPM  . sertraline  50 mg Per Tube QHS  . sodium chloride flush  3 mL Intravenous Q12H    Assessment: 73 year old female with elevated troponins and concern for ACS. Heparin level SUPRAtherapeutic this AM at 0.80, heparin was decreased to 1000 units / hr and repeat heparin level came back this afternoon therapeutic at 0.53.   Goal of Therapy:  Heparin level 0.3-0.7 units/ml Monitor platelets by anticoagulation protocol: Yes   Plan:  Continue current heparin infusion at 1000 units/hr Continue  to monitor H&H and platelets Daily Heparin level and CBC in AM  Thank you for allowing pharmacy to be a part of this patient's care.  Tamela Gammon, PharmD 01/25/2018 2:15 PM PGY-1 Pharmacy Resident Direct Phone: [phone] Please check [email or web address] for unit-specific pharmacist phone numbers

## 2018-01-25 NOTE — Progress Notes (Signed)
[place] for Heparin Indication: chest pain/ACS  Allergies  Allergen Reactions  . Codeine Itching    Patient Measurements: Height: 4\' 11"  (149.9 cm) Weight: 227 lb 11.8 oz (103.3 kg) IBW/kg (Calculated) : 43.2 Heparin Dosing Weight: 75 kg  Vital Signs: Temp: 98.4 F (36.9 C) (01/22 0344) Temp Source: Oral (01/22 0344) BP: 116/58 (01/22 0400) Pulse Rate: 61 (01/22 0400)  Labs: Recent Labs    01/22/18 1414 01/22/18 2109  01/23/18 0608  01/23/18 1536 01/24/18 0414 01/25/18 0440  HGB  --   --    < > 8.0*  --   --  8.0* 8.1*  HCT  --   --   --  27.2*  --   --  26.5* 26.5*  PLT  --   --   --  241  --   --  228 199  APTT  --  31  --   --   --   --   --   --   LABPROT  --  16.6*  --   --   --   --   --   --   INR  --  1.35  --   --   --   --   --   --   HEPARINUNFRC  --   --   --   --    < > 0.48 0.59 0.80*  CREATININE  --   --    < > 1.74*  --   --  1.75* 1.61*  TROPONINI 3.91* 10.71*  --  21.39*  --   --   --   --    < > = values in this interval not displayed.    Estimated Creatinine Clearance: 33.5 mL/min (A) (by C-G formula based on SCr of 1.61 mg/dL (H)).  Assessment: 73 yo female with elevated cardiac markers for heparin  Goal of Therapy:  Heparin level 0.3-0.7 units/ml Monitor platelets by anticoagulation protocol: Yes   Plan:  Decrease Heparin 1000 units/hr  Phillis Knack, PharmD, BCPS  01/25/2018

## 2018-01-25 NOTE — Progress Notes (Signed)
Inpatient Diabetes Program Recommendations  AACE/ADA: New Consensus Statement on Inpatient Glycemic Control (2015)  Target Ranges:  Prepandial:   less than 140 mg/dL      Peak postprandial:   less than 180 mg/dL (1-2 hours)      Critically ill patients:  140 - 180 mg/dL   Lab Results  Component Value Date   GLUCAP 222 (H) 01/25/2018   HGBA1C 7.6 (H) 10/31/2017    Review of Glycemic ControlResults for [name], [name] (MRN [id number]) as of 01/25/2018 11:16  Ref. Range 01/24/2018 15:59 01/24/2018 20:21 01/24/2018 23:42 01/25/2018 03:45 01/25/2018 08:04 01/25/2018 11:08  Glucose-Capillary Latest Ref Range: 70 - 99 mg/dL 189 (H) 177 (H) 182 (H) 164 (H) 181 (H) 222 (H)    Diabetes history: DM 2 Outpatient Diabetes medications: Levemir 15 units daily Current orders for Inpatient glycemic control:  Novolog sensitive q 4 hours, Levemir 5 units daily  Inpatient Diabetes Program Recommendations:    May consider increasing Levemir to 10 units daily.    Thanks  Adah Perl, RN, BC-ADM Inpatient Diabetes Coordinator Pager [phone] (8a-5p)

## 2018-01-25 NOTE — Progress Notes (Signed)
NAME:  [name], MRN:  [id number], DOB:  [birth year], LOS: 3 ADMISSION DATE:  01/22/2018, CONSULTATION DATE: January 22, 2018 REFERRING MD: Dr. Thereasa Solo, CHIEF COMPLAINT: Shortness of breath  Brief History   This is a 73 year old female with a history of CAD and aortic stenosis who presented with hypoxic respiratory failure, PCCM consulted for worsening respiratory failure on 01/22/18.  History of present illness   This is a 73 year old female with a history of CAD, DM, HFrEF, HTN, HLD, on 2L supplemental oxygen at home, and other medical issues listed below who was admitted the morning of 01/22/18 for acute respiratory failure with hypoxia. All information was gathered from the chart. She had been having shortness of breath for about 1-2 days prior to admission. In the ED she was given IV diuretics, morphine, and noninvasive mechanical ventilation. Labs showed an elevated BNP to 2778, troponin 2.63, K 5.4, and hgb 9.4 (near baseline). EKG showed sinus tachycardia with ST depressions in leads V2, V3, and T wave inversions in lateral leads. Cardiology was consulted and heparin drip was started. She was admitted to the hospital service however her her respiratory status worsened with worsening hypoxia requiring intubation and she was transferred to the ICU.  Central line was placed in her left IJ.  She was also noted to have bruising around her eyes, heparin was held and CT head was ordered. CT head showed no acute findings so heparin restarted.   Spoke with daughter and husband at bedside. Daughter reports that [name] had been living with her for the past 5 months until about 2 weeks ago when she went home. The daughter had been helping with her medications however she thinks that the patient was not taking her fluid pills. She also reports that the patient had taken 15 Xanax within the past couple of days. She has been being evaluated for a TAVR however apparently she had decided not to get it. The  patient had expressed to the daughter and husband that she did not want any CPR or serious interventions prior to this hospitalization however apparently had changed her mind yesterday when she agreed to be intubated.  They report that she has been having shortenss of breath for the past 2-3 days, and has had some orthopnea. She wears 3L [place] at home.  They report that her dry weight is around 219.   Past Medical History  Coronary artery disease Aortic stenosis Chronic kidney disease Diabetes mellitus Morbid obesity  Significant Hospital Events   01/22/18 > ICU  Consults:  Cardiology  Procedures:  01/22/18 > Central line placement 01/22/18 > intubated   Significant Diagnostic Tests:  July 2019 echocardiogram showed mild LVH, LVEF 45 to 50%, moderate aortic valve stenosis, mild aortic valve regurgitation, mitral valve with mild regurgitation, right atrium mildly dilated, PA pressure 57 mmHg  CT head 01/22/18: IMPRESSION: Atrophy and chronic microvascular ischemic change in the white matter. No acute abnormality.  Micro Data:    Antimicrobials:     Interim history/subjective:  Patient is awake and alert, no family at bedside. Currently intubated. No acute events overnight.   Objective   Blood pressure (!) 117/52, pulse 64, temperature 98.4 F (36.9 C), temperature source Oral, resp. rate (!) 26, height 4\' 11"  (1.499 m), weight 103.3 kg, SpO2 100 %.    Vent Mode: PRVC FiO2 (%):  [40 %] 40 % Set Rate:  [26 bmp] 26 bmp Vt Set:  [350 mL] 350 mL PEEP:  [  5 cmH20] 5 cmH20 Pressure Support:  [5 cmH20] 5 cmH20 Plateau Pressure:  [18 cmH20] 18 cmH20   Intake/Output Summary (Last 24 hours) at 01/25/2018 3474 Last data filed at 01/25/2018 0600 Gross per 24 hour  Intake 2502.01 ml  Output 4100 ml  Net -1597.99 ml   Filed Weights   01/23/18 0500 01/24/18 0409 01/25/18 0323  Weight: 104.7 kg 105 kg 103.3 kg    Examination: General: Minimally sedated, opens eyes and follows  commands, intubated HENT: Normocephalic, atraumatic Lungs: Bibasilar crackles, on ventilator Cardiovascular: RRR, systolic murmur, JVD difficult to assess Abdomen: Soft, non-tender, no gaurding Extremities: 1+ BL LE edema Neuro: Sedated, following commands GU: Foley in place   Resolved Hospital Problem list     Assessment & Plan:    Hypercarbic and hypoxic respiratory failure 2/2 acute pulmonary edema HFrEF: Echo in July showed EF 45-50% Respiratory acidosis -She was started on a lasix drip yesterday, had good output, net output of -1300. We attempted to extubate today however patient did not have a cuff leak. She is still volume overloaded, with some LE edema and bibasliar crackles on exam. CVP today was 19-20. Ordered decadron and will continue to diurese today and attempt to extubate tomorrow.  -Echocardiogram showed EF of 35%, mild LVH, grade 2 diastolic dysfunction, moderate to severe aortic stenosis, diffuse hypokinesis. Dilated IVC.  -Remain intubated today -continue mechanical ventilation -Fentanyl and PRN midazolam for sedation, RASS score target of -1 to -2 -Daily sedation vacation and SBT -Continue lasix 10 mg/hr lasix drip -Strict I and Os, daily weight  Aortic stenosis Elevated troponin 2/2 Demand ischemia with CAD, not consistent with ACS -Cardiology on board, appreciate recommendations.  -Troponin elevated to 21, likely an ACS -Telemetry monitoring -ASA -Continue Heparin drip -Hold Imdur -Hold metoprolol  CKD: -Cr has been improving, had a lsight bump likely 2/2 her heart failure exacerbation.  -Repeat BMP in AM  Hyperkalemia: -Resolved -Daily BMP to monitor  Macrocytic anemia: -On admission Hgb 9.4 and MCV 101.2. Today Hgb 8.1  -No signs of active bleeding -Transfuse for <7  Diabetes mellitus: -Patient is on Levemir and novolog at home -Increase levemir to 8 units daily -SSI -Frequent CBGs  Seizure disorder: -On Keppra at home, continue  this  Best practice:  Diet: Tube feeds Pain/Anxiety/Delirium protocol (if indicated): Fentanyl and versed VAP protocol (if indicated): Yes DVT prophylaxis: Heparin gtt GI prophylaxis: Famotidine Glucose control: SSI Mobility: Bedrest Code Status: Full code Family Communication: Spoke with husband Disposition: Remain in ICU  Labs   CBC: Recent Labs  Lab 01/22/18 0155 01/23/18 0608 01/24/18 0414 01/25/18 0440  WBC 9.5 8.0 11.3* 11.0*  NEUTROABS 6.3  --   --   --   HGB 9.4* 8.0* 8.0* 8.1*  HCT 32.7* 27.2* 26.5* 26.5*  MCV 101.2* 104.2* 101.1* 99.6  PLT 298 241 228 259    Basic Metabolic Panel: Recent Labs  Lab 01/22/18 0155 01/22/18 0736 01/22/18 2109 01/22/18 2253 01/23/18 0608 01/23/18 1536 01/24/18 0414 01/25/18 0440  NA 142  --   --  135 140  --  140 140  K 5.4*  --   --  6.0* 5.5*  --  5.0 4.1  CL 110  --   --  106 109  --  109 104  CO2 19*  --   --  22 21*  --  24 26  GLUCOSE 208*  --   --  256* 176*  --  178* 197*  BUN 39*  --   --  54* 60*  --  73* 81*  CREATININE 1.10*  --   --  1.47* 1.74*  --  1.75* 1.61*  CALCIUM 9.4  --   --  8.7* 8.6*  --  8.5* 8.5*  MG  --  2.1 2.1  --  2.0 2.1  --   --   PHOS  --  5.2* 4.6  --  5.0* 4.2  --   --    GFR: Estimated Creatinine Clearance: 33.5 mL/min (A) (by C-G formula based on SCr of 1.61 mg/dL (H)). Recent Labs  Lab 01/22/18 0155 01/23/18 0608 01/24/18 0414 01/25/18 0440  WBC 9.5 8.0 11.3* 11.0*    Liver Function Tests: No results for input(s): AST, ALT, ALKPHOS, BILITOT, PROT, ALBUMIN in the last 168 hours. No results for input(s): LIPASE, AMYLASE in the last 168 hours. No results for input(s): AMMONIA in the last 168 hours.  ABG    Component Value Date/Time   PHART 7.320 (L) 01/22/2018 2149   PCO2ART 46.1 01/22/2018 2149   PO2ART 197.0 (H) 01/22/2018 2149   HCO3 24.0 01/22/2018 2149   TCO2 25 01/22/2018 2149   ACIDBASEDEF 2.0 01/22/2018 2149   O2SAT 100.0 01/22/2018 2149     Coagulation  Profile: Recent Labs  Lab 01/22/18 2109  INR 1.35    Cardiac Enzymes: Recent Labs  Lab 01/22/18 0155 01/22/18 0736 01/22/18 1414 01/22/18 2109 01/23/18 0608  TROPONINI 2.63* 2.66* 3.91* 10.71* 21.39*    HbA1C: Hgb A1c MFr Bld  Date/Time Value Ref Range Status  10/31/2017 10:35 AM 7.6 (H) 4.6 - 6.5 % Final    Comment:    Glycemic Control Guidelines for People with Diabetes:Non Diabetic:  <6%Goal of Therapy: <7%Additional Action Suggested:  >8%   07/22/2017 02:06 PM 7.9 (H) 4.8 - 5.6 % Final    Comment:    (NOTE) Pre diabetes:          5.7%-6.4% Diabetes:              >6.4% Glycemic control for   <7.0% adults with diabetes     CBG: Recent Labs  Lab 01/24/18 1111 01/24/18 1559 01/24/18 2021 01/24/18 2342 01/25/18 0345  GLUCAP 199* 189* 177* 182* 164*    Review of Systems:   Unable to obtain due to patients mental status.   Past Medical History  She,  has a past medical history of Anxiety, Coronary artery disease, Depression, Diabetes mellitus, Edema of lower extremity, Esophageal stricture (2008), Fatigue, GERD (gastroesophageal reflux disease), Heart murmur, Hiatal hernia (2008), Hyperlipidemia, Hypertension, Knee pain, Morbid obesity ([place]), Nonproductive cough, Orthopnea, Seizure disorder ([place]), SOB (shortness of breath), Syncope and collapse, and Tremor.   Surgical History    Past Surgical History:  Procedure Laterality Date  . ABDOMINAL HYSTERECTOMY    . CARDIAC CATHETERIZATION  03/28/2009  . KNEE ARTHROSCOPY    . TRANSTHORACIC ECHOCARDIOGRAM     showed ef of 65% with no regional wall motion abnormalities     Social History   reports that she quit smoking about 47 years ago. She has never used smokeless tobacco. She reports that she does not drink alcohol or use drugs.   Family History   Her family history includes COPD in her father; Diabetes in her brother and brother; Heart attack in her mother; Heart disease in her sister; Hypertension in her  mother; Kidney disease in her brother; Prostate cancer in her brother.   Allergies Allergies  Allergen Reactions  . Codeine Itching     Home  Medications  Prior to Admission medications   Medication Sig Start Date End Date Taking? Authorizing Provider  acetaminophen (TYLENOL) 500 MG tablet Take 1,000 mg by mouth 2 (two) times daily. For leg pain   Yes [provider]  ALPRAZolam (XANAX) 0.5 MG tablet Take 1 tablet (0.5 mg total) by mouth at bedtime. 10/31/17  Yes Briscoe Deutscher, DO  aspirin 325 MG EC tablet Take 1 tablet (325 mg total) by mouth daily. 10/04/17  Yes Briscoe Deutscher, DO  clopidogrel (PLAVIX) 75 MG tablet TAKE ONE TABLET BY MOUTH DAILY Patient taking differently: Take 75 mg by mouth at bedtime.  11/08/17  Yes Briscoe Deutscher, DO  collagenase (SANTYL) ointment Apply 1 application topically daily. Patient taking differently: Apply 1 application topically daily. Apply to feet 10/14/17  Yes Briscoe Deutscher, DO  furosemide (LASIX) 20 MG tablet TAKE ONE TABLET BY MOUTH TWICE A DAY Patient taking differently: Take 20 mg by mouth daily.  12/14/17  Yes Briscoe Deutscher, DO  gabapentin (NEURONTIN) 300 MG capsule TAKE ONE CAPSULE BY MOUTH AT BEDTIME Patient taking differently: Take 300 mg by mouth at bedtime.  01/16/18  Yes Briscoe Deutscher, DO  insulin detemir (LEVEMIR) 100 unit/ml SOLN Inject 15 Units into the skin at bedtime.   Yes [provider]  isosorbide mononitrate (IMDUR) 60 MG 24 hr tablet Take 1 tablet (60 mg total) by mouth daily. 10/04/17  Yes Briscoe Deutscher, DO  levETIRAcetam (KEPPRA) 1000 MG tablet TAKE 1 TABLET BY MOUTH TWICE DAILY Patient taking differently: Take 1,000 mg by mouth 2 (two) times daily.  12/13/17  Yes Briscoe Deutscher, DO  losartan (COZAAR) 25 MG tablet TAKE ONE TABLET BY MOUTH DAILY Patient taking differently: Take 25 mg by mouth at bedtime.  11/14/17  Yes Briscoe Deutscher, DO  Melatonin 3 MG TABS Take 1 tablet (3 mg total) by mouth at bedtime.  08/26/17  Yes Briscoe Deutscher, DO  metoprolol tartrate (LOPRESSOR) 50 MG tablet TAKE 1 TABLET BY MOUTH ONCE DAILY Patient taking differently: Take 50 mg by mouth daily.  12/13/17  Yes Briscoe Deutscher, DO  ondansetron (ZOFRAN) 4 MG tablet Take 1 tablet (4 mg total) by mouth every 6 (six) hours as needed for nausea or vomiting. 08/26/17  Yes Briscoe Deutscher, DO  oxymetazoline (AFRIN) 0.05 % nasal spray Place 1 spray into both nostrils 2 (two) times daily as needed (nosebleeds). 08/26/17  Yes Briscoe Deutscher, DO  potassium chloride (K-DUR) 10 MEQ tablet TAKE ONE TABLET BY MOUTH DAILY Patient taking differently: Take 10 mEq by mouth daily.  12/14/17  Yes Briscoe Deutscher, DO  rizatriptan (MAXALT) 10 MG tablet TAKE 1 TABLET BY MOUTH DAILY AS NEEDED FOR CHRONIC  PAIN Patient taking differently: Take 10 mg by mouth daily as needed for migraine.  12/17/17  Yes Briscoe Deutscher, DO  rosuvastatin (CRESTOR) 20 MG tablet Take 1 tablet (20 mg total) by mouth every evening. Patient taking differently: Take 20 mg by mouth at bedtime.  10/04/17  Yes Briscoe Deutscher, DO  sertraline (ZOLOFT) 50 MG tablet Take 1 tablet (50 mg total) by mouth at bedtime. 10/31/17  Yes Briscoe Deutscher, DO  sodium chloride (OCEAN) 0.65 % SOLN nasal spray Place 2 sprays into both nostrils 3 (three) times daily. Patient taking differently: Place 2 sprays into both nostrils 3 (three) times daily as needed for congestion.  08/26/17  Yes Briscoe Deutscher, DO  traMADol (ULTRAM) 50 MG tablet Take 1 tablet (50 mg total) by mouth every 6 (six) hours as needed. Patient taking  differently: Take 50 mg by mouth every 6 (six) hours as needed (pain).  12/16/17  Yes Briscoe Deutscher, DO  acetaminophen (TYLENOL) 325 MG tablet Take 2 tablets (650 mg total) by mouth every 4 (four) hours as needed for headache or mild pain. Patient not taking: Reported on 01/22/2018 09/12/16   Robbie Lis, MD  albuterol (PROVENTIL HFA;VENTOLIN HFA) 108 [phone] Base) MCG/ACT inhaler Inhale 2  puffs into the lungs every 6 (six) hours as needed for wheezing or shortness of breath. Patient not taking: Reported on 01/22/2018 10/31/17   Briscoe Deutscher, DO  glucose blood (ONETOUCH VERIO) test strip 1 each by Other route daily. And lancets 1/day. Dx code E11.9. 08/26/17   Briscoe Deutscher, DO  insulin aspart (NOVOLOG) 100 UNIT/ML injection Inject 0-9 Units into the skin 3 (three) times daily with meals. Sliding scale CBG 70 - 120: 0 units CBG 121 - 150: 1 unit,  CBG 151 - 200: 2 units,  CBG 201 - 250: 3 units,  CBG 251 - 300: 5 units,  CBG 301 - 350: 7 units,  CBG 351 - 400: 9 units   CBG > 400: 9 units and notify your MD Patient not taking: Reported on 01/22/2018 08/26/17   Briscoe Deutscher, DO  insulin detemir (LEVEMIR) 100 UNIT/ML injection Inject 0.1 mLs (10 Units total) into the skin daily. Patient not taking: Reported on 01/22/2018 08/26/17   Briscoe Deutscher, DO  loratadine (CLARITIN) 10 MG tablet Take 1 tablet (10 mg total) by mouth daily as needed for allergies. Patient not taking: Reported on 01/22/2018 08/26/17   Briscoe Deutscher, DO  polyethylene glycol [facility] / GLYCOLAX) packet Take 17 g by mouth daily as needed for mild constipation. Patient not taking: Reported on 01/22/2018 08/26/17   Briscoe Deutscher, DO  potassium chloride (K-DUR,KLOR-CON) 10 MEQ tablet Take 1 tablet (10 mEq total) by mouth daily. Patient not taking: Reported on 01/22/2018 10/04/17   Briscoe Deutscher, DO  SANTYL ointment APPLY ONE APPLICATION TOPICALLY DAILY Patient not taking: Reported on 01/22/2018 11/08/17   Briscoe Deutscher, DO        Asencion Noble, M.D. PGY1 Pager [phone] 01/25/2018 6:39 AM

## 2018-01-25 NOTE — Progress Notes (Signed)
RT NOTE:  Extubation orders are in place for pt., cuff leak performed and pt did not have a cuff leak. Paged MD to have a dose of steroids ordered; extubation pending after steroids administered

## 2018-01-26 DIAGNOSIS — I25118 Atherosclerotic heart disease of native coronary artery with other forms of angina pectoris: Secondary | ICD-10-CM

## 2018-01-26 LAB — BASIC METABOLIC PANEL
Anion gap: 9 (ref 5–15)
BUN: 86 mg/dL — ABNORMAL HIGH (ref 8–23)
CALCIUM: 8.3 mg/dL — AB (ref 8.9–10.3)
CO2: 28 mmol/L (ref 22–32)
Chloride: 101 mmol/L (ref 98–111)
Creatinine, Ser: 1.4 mg/dL — ABNORMAL HIGH (ref 0.44–1.00)
GFR calc Af Amer: 43 mL/min — ABNORMAL LOW (ref >60)
GFR calc non Af Amer: 37 mL/min — ABNORMAL LOW (ref >60)
Glucose, Bld: 325 mg/dL — ABNORMAL HIGH (ref 70–99)
Potassium: 3.9 mmol/L (ref 3.5–5.1)
Sodium: 138 mmol/L (ref 135–145)

## 2018-01-26 LAB — CBC
HCT: 25.8 % — ABNORMAL LOW (ref 36.0–46.0)
Hemoglobin: 7.9 g/dL — ABNORMAL LOW (ref 12.0–15.0)
MCH: 30 pg (ref 26.0–34.0)
MCHC: 30.6 g/dL (ref 30.0–36.0)
MCV: 98.1 fL (ref 80.0–100.0)
Platelets: 184 10*3/uL (ref 150–400)
RBC: 2.63 MIL/uL — ABNORMAL LOW (ref 3.87–5.11)
RDW: 16.3 % — AB (ref 11.5–15.5)
WBC: 8.5 10*3/uL (ref 4.0–10.5)
nRBC: 0 % (ref 0.0–0.2)

## 2018-01-26 LAB — HEPARIN LEVEL (UNFRACTIONATED): HEPARIN UNFRACTIONATED: 0.46 [IU]/mL (ref 0.30–0.70)

## 2018-01-26 LAB — GLUCOSE, CAPILLARY
Glucose-Capillary: 162 mg/dL — ABNORMAL HIGH (ref 70–99)
Glucose-Capillary: 173 mg/dL — ABNORMAL HIGH (ref 70–99)
Glucose-Capillary: 228 mg/dL — ABNORMAL HIGH (ref 70–99)
Glucose-Capillary: 244 mg/dL — ABNORMAL HIGH (ref 70–99)
Glucose-Capillary: 266 mg/dL — ABNORMAL HIGH (ref 70–99)
Glucose-Capillary: 299 mg/dL — ABNORMAL HIGH (ref 70–99)

## 2018-01-26 MED ORDER — DOCUSATE SODIUM 100 MG PO CAPS
100.0000 mg | ORAL_CAPSULE | Freq: Every day | ORAL | Status: DC | PRN
  Administered 2018-01-27: 100 mg via ORAL
  Filled 2018-01-26: qty 1

## 2018-01-26 MED ORDER — POLYETHYLENE GLYCOL 3350 17 G PO PACK
17.0000 g | PACK | Freq: Every day | ORAL | Status: DC | PRN
  Administered 2018-01-27: 17 g
  Filled 2018-01-26: qty 1

## 2018-01-26 MED ORDER — DIPHENHYDRAMINE HCL 25 MG PO CAPS: 25.0000 mg | ORAL_CAPSULE | Freq: Four times a day (QID) | ORAL | Status: DC | PRN

## 2018-01-26 MED ORDER — ORAL CARE MOUTH RINSE
15.0000 mL | Freq: Two times a day (BID) | OROMUCOSAL | Status: DC
  Administered 2018-01-26 – 2018-01-29 (×6): 15 mL via OROMUCOSAL

## 2018-01-26 MED ORDER — INSULIN ASPART 100 UNIT/ML ~~LOC~~ SOLN
0.0000 [IU] | SUBCUTANEOUS | Status: DC
  Administered 2018-01-26: 3 [IU] via SUBCUTANEOUS
  Administered 2018-01-26: 5 [IU] via SUBCUTANEOUS
  Administered 2018-01-26: 3 [IU] via SUBCUTANEOUS
  Administered 2018-01-27: 2 [IU] via SUBCUTANEOUS
  Administered 2018-01-27: 3 [IU] via SUBCUTANEOUS
  Administered 2018-01-27: 2 [IU] via SUBCUTANEOUS
  Administered 2018-01-27: 3 [IU] via SUBCUTANEOUS
  Administered 2018-01-27: 2 [IU] via SUBCUTANEOUS
  Administered 2018-01-28: 3 [IU] via SUBCUTANEOUS
  Administered 2018-01-28 – 2018-01-29 (×4): 2 [IU] via SUBCUTANEOUS

## 2018-01-26 MED ORDER — ADULT MULTIVITAMIN LIQUID CH
15.0000 mL | Freq: Every day | ORAL | Status: DC
  Administered 2018-01-27: 15 mL
  Filled 2018-01-26: qty 15

## 2018-01-26 MED ORDER — DEXAMETHASONE SODIUM PHOSPHATE 10 MG/ML IJ SOLN
10.0000 mg | Freq: Once | INTRAMUSCULAR | Status: AC
  Administered 2018-01-26: 10 mg via INTRAVENOUS
  Filled 2018-01-26: qty 1

## 2018-01-26 NOTE — Care Management Note (Signed)
Case Management Note  Patient Details  Name: Chrissy Ealey MRN: [id number] Date of Birth: 25-Aug-1945  Subjective/Objective:                    Action/Plan:  Spoke w patient's family at bedside. Discussed dispo plan. They provide that primary choice is for patient to return home. Lives with spouse. Patient has [place] R[facility] hospital bed, home O2 through [facility].  Patient has used [facility] for [facility] in the past, they are unsure if they would like to use them again. Discussed private duty care. Explained she could have both private duty care (self pay) and [place] services through Medicare. Directed them to resource [email or web address]  CM will continue to follow.   Expected Discharge Date:                  Expected Discharge Plan:     In-House Referral:     Discharge planning Services  CM Consult  Post Acute Care Choice:    Choice offered to:     DME Arranged:    DME Agency:     HH Arranged:    HH Agency:     Status of Service:  In process, will continue to follow  If discussed at Long Length of Stay Meetings, dates discussed:    Additional Comments:  Carles Collet, RN 01/26/2018, 2:47 PM

## 2018-01-26 NOTE — Procedures (Signed)
Extubation Procedure Note  Patient Details:   Name: [name] DOB: [birth year] MRN: [id number]   Airway Documentation:    Vent end date: 01/26/18 Vent end time: 1030   Evaluation  O2 sats: stable throughout Complications: No apparent complications Patient did tolerate procedure well. Bilateral Breath Sounds: Clear, Diminished   Yes, pt had no cuff leak Dr Debbora Dus notifed & ordered to continue with extubation.   Cordella Register 01/26/2018, 10:36 AM

## 2018-01-26 NOTE — Progress Notes (Signed)
ANTICOAGULATION CONSULT NOTE - Follow Up Consult  Pharmacy Consult for Heparin Indication: chest pain/ACS  Allergies  Allergen Reactions  . Codeine Itching    Patient Measurements: Height: _0  (149.9 cm) Weight: 222 lb 14.2 oz (101.1 kg) IBW/kg (Calculated) : 43.2 Heparin Dosing Weight: 75kg  Vital Signs: Temp: 98 F (36.7 C) (01/23 0415) Temp Source: Oral (01/23 0415) BP: 123/58 (01/23 0600) Pulse Rate: 71 (01/23 0600)  Labs: Recent Labs    01/24/18 0414 01/25/18 0440 01/25/18 1400 01/26/18 0431  HGB 8.0* 8.1*  --  7.9*  HCT 26.5* 26.5*  --  25.8*  PLT 228 199  --  184  HEPARINUNFRC 0.59 0.80* 0.53 0.46  CREATININE 1.75* 1.61*  --  1.40*    Estimated Creatinine Clearance: 38.1 mL/min (A) (by C-G formula based on SCr of 1.4 mg/dL (H)).   Medications:  Scheduled:  . ALPRAZolam  0.5 mg Per Tube QHS  . aspirin  81 mg Per Tube Daily  . chlorhexidine gluconate (MEDLINE KIT)  15 mL Mouth Rinse BID  . Chlorhexidine Gluconate Cloth  6 each Topical Q0600  . collagenase   Topical Daily  . famotidine  20 mg Per Tube BID  . fentaNYL (SUBLIMAZE) injection  50 mcg Intravenous Once  . furosemide  80 mg Intravenous Once  . gabapentin  300 mg Per Tube QHS  . Gerhardt's butt cream   Topical TID  . insulin aspart  0-9 Units Subcutaneous Q4H  . insulin detemir  8 Units Subcutaneous QHS  . levETIRAcetam  1,000 mg Per Tube BID  . losartan  25 mg Per Tube Daily  . mouth rinse  15 mL Mouth Rinse 10 times per day  . metoprolol tartrate  50 mg Per Tube Daily  . metoprolol tartrate  5 mg Intravenous Once  .  morphine injection  2 mg Intravenous Once  . multivitamin  15 mL Oral Daily  . mupirocin ointment  1 application Nasal BID  . rosuvastatin  20 mg Per Tube QPM  . sertraline  50 mg Per Tube QHS  . sodium chloride flush  3 mL Intravenous Q12H    Assessment: 73 year old female here with acute respiratory failure due to decompensated HF with LV dysfunction and aortic  stenosis. Patient presented with elevated troponins and concern for ACS.   Heparin level therapeutic this AM at 0.46 Hgb slightly low at 7.9 from 8.1 day prior. Hct 25.8 - low, but relatively stable.  Platelets decrease from 199 to 184. Slight bleeding around the tongue, patient is still intubated, no other signs or symptom of bleeding per Nurse. Nurse said she will continue to monitor.   Goal of Therapy:  Heparin level 0.3-0.7 units/ml Monitor platelets by anticoagulation protocol: Yes   Plan:  Continue current heparin infusion at 1000 units/hr Continue to monitor H&H and platelets Daily Heparin level and CBC in AM  Thank you for allowing pharmacy to be a part of this patient's care.  Tamela Gammon, PharmD 01/26/2018 6:54 AM PGY-1 Pharmacy Resident Direct Phone: [phone] Please check [email or web address] for unit-specific pharmacist phone numbers

## 2018-01-26 NOTE — Progress Notes (Signed)
NAME:  [name], MRN:  [id number], DOB:  Feb 11, 1945, LOS: 4 ADMISSION DATE:  01/22/2018, CONSULTATION DATE: January 22, 2018 REFERRING MD: Dr. Thereasa Solo, CHIEF COMPLAINT: Shortness of breath  Brief History   This is a 73 year old female with a history of CAD and aortic stenosis who presented with hypoxic respiratory failure, PCCM consulted for worsening respiratory failure on 01/22/18.  History of present illness   This is a 73 year old female with a history of CAD, DM, HFrEF, HTN, HLD, on 2L supplemental oxygen at home, and other medical issues listed below who was admitted the morning of 01/22/18 for acute respiratory failure with hypoxia. All information was gathered from the chart. She had been having shortness of breath for about 1-2 days prior to admission. In the ED she was given IV diuretics, morphine, and noninvasive mechanical ventilation. Labs showed an elevated BNP to 2778, troponin 2.63, K 5.4, and hgb 9.4 (near baseline). EKG showed sinus tachycardia with ST depressions in leads V2, V3, and T wave inversions in lateral leads. Cardiology was consulted and heparin drip was started. She was admitted to the hospital service however her her respiratory status worsened with worsening hypoxia requiring intubation and she was transferred to the ICU.  Central line was placed in her left IJ.  She was also noted to have bruising around her eyes, heparin was held and CT head was ordered. CT head showed no acute findings so heparin restarted.   Spoke with daughter and husband at bedside. Daughter reports that [name] had been living with her for the past 5 months until about 2 weeks ago when she went home. The daughter had been helping with her medications however she thinks that the patient was not taking her fluid pills. She also reports that the patient had taken 15 Xanax within the past couple of days. She has been being evaluated for a TAVR however apparently she had decided not to get it. The  patient had expressed to the daughter and husband that she did not want any CPR or serious interventions prior to this hospitalization however apparently had changed her mind yesterday when she agreed to be intubated.  They report that she has been having shortenss of breath for the past 2-3 days, and has had some orthopnea. She wears 3L [place] at home.  They report that her dry weight is around 219.   Past Medical History  Coronary artery disease Aortic stenosis Chronic kidney disease Diabetes mellitus Morbid obesity  Significant Hospital Events   01/22/18 > ICU  Consults:  Cardiology  Procedures:  01/22/18 > Central line placement 01/22/18 > intubated   Significant Diagnostic Tests:  July 2019 echocardiogram showed mild LVH, LVEF 45 to 50%, moderate aortic valve stenosis, mild aortic valve regurgitation, mitral valve with mild regurgitation, right atrium mildly dilated, PA pressure 57 mmHg  CT head 01/22/18: IMPRESSION: Atrophy and chronic microvascular ischemic change in the white matter. No acute abnormality.  Micro Data:    Antimicrobials:     Interim history/subjective:  Patient is awake and alert, following commands. No family at bedside, updated husband yesterday. No acute events overnight. Has had good diuresis and CVP is 7 today.   Objective   Blood pressure (!) 121/57, pulse (!) 58, temperature 98 F (36.7 C), temperature source Oral, resp. rate (!) 26, height 4\' 11"  (1.499 m), weight 101.1 kg, SpO2 100 %. CVP:  [19 mmHg] 19 mmHg  Vent Mode: PRVC FiO2 (%):  [40 %] 40 %  Set Rate:  [26 bmp] 26 bmp Vt Set:  [350 mL] 350 mL PEEP:  [5 cmH20] 5 cmH20 Pressure Support:  [8 cmH20] 8 cmH20 Plateau Pressure:  [14 cmH20-18 cmH20] 18 cmH20   Intake/Output Summary (Last 24 hours) at 01/26/2018 0640 Last data filed at 01/26/2018 0600 Gross per 24 hour  Intake 2048.9 ml  Output 3825 ml  Net -1776.1 ml   Filed Weights   01/24/18 0409 01/25/18 0323 01/26/18 0500  Weight:  105 kg 103.3 kg 101.1 kg    Examination: General: Minimally sedated, opens eyes and follows commands, intubated HENT: Normocephalic, atraumatic Lungs: Bibasilar crackles, on ventilator Cardiovascular: RRR, systolic murmur, JVD difficult to assess Abdomen: Soft, non-tender, no gaurding Extremities: 1+ BL LE edema Neuro: Sedated, following commands GU: Foley in place   Resolved Hospital Problem list     Assessment & Plan:    Hypercarbic and hypoxic respiratory failure 2/2 acute pulmonary edema HFrEF: Echo in July showed EF 45-50% Respiratory acidosis -Patient was on a lasix drip yesterday, had good output of -1,700 cc. Wt today was 222, down from 227 yesterday. CVP today was 7. Still has bibasilar crackles on exam and 1+ BL edema.  -Echocardiogram showed EF of 35%, mild LVH, grade 2 diastolic dysfunction, moderate to severe aortic stenosis, diffuse hypokinesis. Dilated IVC.  -Fentanyl and PRN midazolam for sedation -Daily sedation vacation and SBT -Attempt to extubate today -Continue mechanical ventilation -Discontinue lasix 10 mg/hr lasix drip -Start lasix 20 mg IV BID, she is on 20 mg PO BID at home -Strict I and Os, daily weight  Aortic stenosis Elevated troponin 2/2 Demand ischemia with CAD, not consistent with ACS -Cardiology on board, appreciate recommendations.  -Troponin elevated to 21, likely an ACS -Telemetry monitoring -ASA -Continue Heparin drip -Hold Imdur -Hold metoprolol  CKD: -Cr has been improving, had a slight bump likely 2/2 her heart failure exacerbation.  -Repeat BMP in AM  Hyperkalemia: -Resolved -Daily BMP to monitor  Macrocytic anemia: -On admission Hgb 9.4 and MCV 101.2. Today Hgb 7.9  -No signs of active bleeding -Transfuse for <7  Diabetes mellitus: -Patient is on Levemir and novolog at home -CBGs have been elevated, she did receive dexamethasone yesterday due to no cuff leak when attempting to extubate.  -Increase levemir to 10 units  daily -SSI -Frequent CBGs  Seizure disorder: -On Keppra at home, continue this  Best practice:  Diet: Tube feeds Pain/Anxiety/Delirium protocol (if indicated): Fentanyl and versed VAP protocol (if indicated): Yes DVT prophylaxis: Heparin gtt GI prophylaxis: Famotidine Glucose control: SSI Mobility: Bedrest Code Status: Full code Family Communication:  Disposition: Remain in ICU  Labs   CBC: Recent Labs  Lab 01/22/18 0155 01/23/18 0608 01/24/18 0414 01/25/18 0440 01/26/18 0431  WBC 9.5 8.0 11.3* 11.0* 8.5  NEUTROABS 6.3  --   --   --   --   HGB 9.4* 8.0* 8.0* 8.1* 7.9*  HCT 32.7* 27.2* 26.5* 26.5* 25.8*  MCV 101.2* 104.2* 101.1* 99.6 98.1  PLT 298 241 228 199 948    Basic Metabolic Panel: Recent Labs  Lab 01/22/18 0736 01/22/18 2109 01/22/18 2253 01/23/18 0608 01/23/18 1536 01/24/18 0414 01/25/18 0440 01/26/18 0431  NA  --   --  135 140  --  140 140 138  K  --   --  6.0* 5.5*  --  5.0 4.1 3.9  CL  --   --  106 109  --  109 104 101  CO2  --   --  22 21*  --  24 26 28   GLUCOSE  --   --  256* 176*  --  178* 197* 325*  BUN  --   --  54* 60*  --  73* 81* 86*  CREATININE  --   --  1.47* 1.74*  --  1.75* 1.61* 1.40*  CALCIUM  --   --  8.7* 8.6*  --  8.5* 8.5* 8.3*  MG 2.1 2.1  --  2.0 2.1  --   --   --   PHOS 5.2* 4.6  --  5.0* 4.2  --   --   --    GFR: Estimated Creatinine Clearance: 38.1 mL/min (A) (by C-G formula based on SCr of 1.4 mg/dL (H)). Recent Labs  Lab 01/23/18 0608 01/24/18 0414 01/25/18 0440 01/26/18 0431  WBC 8.0 11.3* 11.0* 8.5    Liver Function Tests: No results for input(s): AST, ALT, ALKPHOS, BILITOT, PROT, ALBUMIN in the last 168 hours. No results for input(s): LIPASE, AMYLASE in the last 168 hours. No results for input(s): AMMONIA in the last 168 hours.  ABG    Component Value Date/Time   PHART 7.320 (L) 01/22/2018 2149   PCO2ART 46.1 01/22/2018 2149   PO2ART 197.0 (H) 01/22/2018 2149   HCO3 24.0 01/22/2018 2149   TCO2 25  01/22/2018 2149   ACIDBASEDEF 2.0 01/22/2018 2149   O2SAT 100.0 01/22/2018 2149     Coagulation Profile: Recent Labs  Lab 01/22/18 2109  INR 1.35    Cardiac Enzymes: Recent Labs  Lab 01/22/18 0155 01/22/18 0736 01/22/18 1414 01/22/18 2109 01/23/18 0608  TROPONINI 2.63* 2.66* 3.91* 10.71* 21.39*    HbA1C: Hgb A1c MFr Bld  Date/Time Value Ref Range Status  10/31/2017 10:35 AM 7.6 (H) 4.6 - 6.5 % Final    Comment:    Glycemic Control Guidelines for People with Diabetes:Non Diabetic:  <6%Goal of Therapy: <7%Additional Action Suggested:  >8%   07/22/2017 02:06 PM 7.9 (H) 4.8 - 5.6 % Final    Comment:    (NOTE) Pre diabetes:          5.7%-6.4% Diabetes:              >6.4% Glycemic control for   <7.0% adults with diabetes     CBG: Recent Labs  Lab 01/25/18 1108 01/25/18 1528 01/25/18 2003 01/26/18 0004 01/26/18 0412  GLUCAP 222* 177* 230* 266* 299*    Review of Systems:   Unable to obtain due to patients mental status.   Past Medical History  She,  has a past medical history of Anxiety, Coronary artery disease, Depression, Diabetes mellitus, Edema of lower extremity, Esophageal stricture (2008), Fatigue, GERD (gastroesophageal reflux disease), Heart murmur, Hiatal hernia (2008), Hyperlipidemia, Hypertension, Knee pain, Morbid obesity ([place]), Nonproductive cough, Orthopnea, Seizure disorder ([place]), SOB (shortness of breath), Syncope and collapse, and Tremor.   Surgical History    Past Surgical History:  Procedure Laterality Date  . ABDOMINAL HYSTERECTOMY    . CARDIAC CATHETERIZATION  03/28/2009  . KNEE ARTHROSCOPY    . TRANSTHORACIC ECHOCARDIOGRAM     showed ef of 65% with no regional wall motion abnormalities     Social History   reports that she quit smoking about 47 years ago. She has never used smokeless tobacco. She reports that she does not drink alcohol or use drugs.   Family History   Her family history includes COPD in her father; Diabetes in her  brother and brother; Heart attack in her mother; Heart disease  in her sister; Hypertension in her mother; Kidney disease in her brother; Prostate cancer in her brother.   Allergies Allergies  Allergen Reactions  . Codeine Itching     Home Medications  Prior to Admission medications   Medication Sig Start Date End Date Taking? Authorizing Provider  acetaminophen (TYLENOL) 500 MG tablet Take 1,000 mg by mouth 2 (two) times daily. For leg pain   Yes [provider]  ALPRAZolam (XANAX) 0.5 MG tablet Take 1 tablet (0.5 mg total) by mouth at bedtime. 10/31/17  Yes Briscoe Deutscher, DO  aspirin 325 MG EC tablet Take 1 tablet (325 mg total) by mouth daily. 10/04/17  Yes Briscoe Deutscher, DO  clopidogrel (PLAVIX) 75 MG tablet TAKE ONE TABLET BY MOUTH DAILY Patient taking differently: Take 75 mg by mouth at bedtime.  11/08/17  Yes Briscoe Deutscher, DO  collagenase (SANTYL) ointment Apply 1 application topically daily. Patient taking differently: Apply 1 application topically daily. Apply to feet 10/14/17  Yes Briscoe Deutscher, DO  furosemide (LASIX) 20 MG tablet TAKE ONE TABLET BY MOUTH TWICE A DAY Patient taking differently: Take 20 mg by mouth daily.  12/14/17  Yes Briscoe Deutscher, DO  gabapentin (NEURONTIN) 300 MG capsule TAKE ONE CAPSULE BY MOUTH AT BEDTIME Patient taking differently: Take 300 mg by mouth at bedtime.  01/16/18  Yes Briscoe Deutscher, DO  insulin detemir (LEVEMIR) 100 unit/ml SOLN Inject 15 Units into the skin at bedtime.   Yes [provider]  isosorbide mononitrate (IMDUR) 60 MG 24 hr tablet Take 1 tablet (60 mg total) by mouth daily. 10/04/17  Yes Briscoe Deutscher, DO  levETIRAcetam (KEPPRA) 1000 MG tablet TAKE 1 TABLET BY MOUTH TWICE DAILY Patient taking differently: Take 1,000 mg by mouth 2 (two) times daily.  12/13/17  Yes Briscoe Deutscher, DO  losartan (COZAAR) 25 MG tablet TAKE ONE TABLET BY MOUTH DAILY Patient taking differently: Take 25 mg by mouth at bedtime.  11/14/17   Yes Briscoe Deutscher, DO  Melatonin 3 MG TABS Take 1 tablet (3 mg total) by mouth at bedtime. 08/26/17  Yes Briscoe Deutscher, DO  metoprolol tartrate (LOPRESSOR) 50 MG tablet TAKE 1 TABLET BY MOUTH ONCE DAILY Patient taking differently: Take 50 mg by mouth daily.  12/13/17  Yes Briscoe Deutscher, DO  ondansetron (ZOFRAN) 4 MG tablet Take 1 tablet (4 mg total) by mouth every 6 (six) hours as needed for nausea or vomiting. 08/26/17  Yes Briscoe Deutscher, DO  oxymetazoline (AFRIN) 0.05 % nasal spray Place 1 spray into both nostrils 2 (two) times daily as needed (nosebleeds). 08/26/17  Yes Briscoe Deutscher, DO  potassium chloride (K-DUR) 10 MEQ tablet TAKE ONE TABLET BY MOUTH DAILY Patient taking differently: Take 10 mEq by mouth daily.  12/14/17  Yes Briscoe Deutscher, DO  rizatriptan (MAXALT) 10 MG tablet TAKE 1 TABLET BY MOUTH DAILY AS NEEDED FOR CHRONIC  PAIN Patient taking differently: Take 10 mg by mouth daily as needed for migraine.  12/17/17  Yes Briscoe Deutscher, DO  rosuvastatin (CRESTOR) 20 MG tablet Take 1 tablet (20 mg total) by mouth every evening. Patient taking differently: Take 20 mg by mouth at bedtime.  10/04/17  Yes Briscoe Deutscher, DO  sertraline (ZOLOFT) 50 MG tablet Take 1 tablet (50 mg total) by mouth at bedtime. 10/31/17  Yes Briscoe Deutscher, DO  sodium chloride (OCEAN) 0.65 % SOLN nasal spray Place 2 sprays into both nostrils 3 (three) times daily. Patient taking differently: Place 2 sprays into both nostrils 3 (three) times daily  as needed for congestion.  08/26/17  Yes Briscoe Deutscher, DO  traMADol (ULTRAM) 50 MG tablet Take 1 tablet (50 mg total) by mouth every 6 (six) hours as needed. Patient taking differently: Take 50 mg by mouth every 6 (six) hours as needed (pain).  12/16/17  Yes Briscoe Deutscher, DO  acetaminophen (TYLENOL) 325 MG tablet Take 2 tablets (650 mg total) by mouth every 4 (four) hours as needed for headache or mild pain. Patient not taking: Reported on 01/22/2018 09/12/16   Robbie Lis, MD  albuterol (PROVENTIL HFA;VENTOLIN HFA) 108 [phone] Base) MCG/ACT inhaler Inhale 2 puffs into the lungs every 6 (six) hours as needed for wheezing or shortness of breath. Patient not taking: Reported on 01/22/2018 10/31/17   Briscoe Deutscher, DO  glucose blood (ONETOUCH VERIO) test strip 1 each by Other route daily. And lancets 1/day. Dx code E11.9. 08/26/17   Briscoe Deutscher, DO  insulin aspart (NOVOLOG) 100 UNIT/ML injection Inject 0-9 Units into the skin 3 (three) times daily with meals. Sliding scale CBG 70 - 120: 0 units CBG 121 - 150: 1 unit,  CBG 151 - 200: 2 units,  CBG 201 - 250: 3 units,  CBG 251 - 300: 5 units,  CBG 301 - 350: 7 units,  CBG 351 - 400: 9 units   CBG > 400: 9 units and notify your MD Patient not taking: Reported on 01/22/2018 08/26/17   Briscoe Deutscher, DO  insulin detemir (LEVEMIR) 100 UNIT/ML injection Inject 0.1 mLs (10 Units total) into the skin daily. Patient not taking: Reported on 01/22/2018 08/26/17   Briscoe Deutscher, DO  loratadine (CLARITIN) 10 MG tablet Take 1 tablet (10 mg total) by mouth daily as needed for allergies. Patient not taking: Reported on 01/22/2018 08/26/17   Briscoe Deutscher, DO  polyethylene glycol [facility] / GLYCOLAX) packet Take 17 g by mouth daily as needed for mild constipation. Patient not taking: Reported on 01/22/2018 08/26/17   Briscoe Deutscher, DO  potassium chloride (K-DUR,KLOR-CON) 10 MEQ tablet Take 1 tablet (10 mEq total) by mouth daily. Patient not taking: Reported on 01/22/2018 10/04/17   Briscoe Deutscher, DO  SANTYL ointment APPLY ONE APPLICATION TOPICALLY DAILY Patient not taking: Reported on 01/22/2018 11/08/17   Briscoe Deutscher, DO        Asencion Noble, M.D. PGY1 Pager [phone] 01/26/2018 6:40 AM

## 2018-01-26 NOTE — Progress Notes (Signed)
Progress Note  Patient Name: [name] Date of Encounter: 01/26/2018  Primary Cardiologist: Rozann Lesches, MD    Subjective    73 year old female who receives her care at [facility] hospital.  She has a history of severe aortic stenosis and severe three-vessel coronary artery disease.  She was admitted with respiratory failure.  Had a heart catheterization in June 2019.  She has severe small vessel disease.  The notes from [facility] suggest that there are no good surgical targets. She was in the process of being evaluated for TAVR but needed to be optimized medically.  She remains on the vent but seems to be improving.  The critical care team's hope is that she can be extubated today. We still do not do not have any idea what her functional status is.  Notes  from Dr. Wyline Copas suggest that she is not a good candidate for coronary artery bypass grafting because of lack of sufficient targets.  And was to proceed with TAVR.  I have called Dr. Rhetta Mura office today to discuss with him but he is on vacation until Feb. 10  Inpatient Medications    Scheduled Meds: . ALPRAZolam  0.5 mg Per Tube QHS  . aspirin  81 mg Per Tube Daily  . chlorhexidine gluconate (MEDLINE KIT)  15 mL Mouth Rinse BID  . Chlorhexidine Gluconate Cloth  6 each Topical Q0600  . collagenase   Topical Daily  . famotidine  20 mg Per Tube BID  . fentaNYL (SUBLIMAZE) injection  50 mcg Intravenous Once  . furosemide  80 mg Intravenous Once  . gabapentin  300 mg Per Tube QHS  . Gerhardt's butt cream   Topical TID  . insulin aspart  0-9 Units Subcutaneous Q4H  . insulin detemir  8 Units Subcutaneous QHS  . levETIRAcetam  1,000 mg Per Tube BID  . losartan  25 mg Per Tube Daily  . mouth rinse  15 mL Mouth Rinse 10 times per day  . metoprolol tartrate  50 mg Per Tube Daily  . metoprolol tartrate  5 mg Intravenous Once  .  morphine injection  2 mg Intravenous Once  . multivitamin  15 mL Oral Daily  . mupirocin  ointment  1 application Nasal BID  . rosuvastatin  20 mg Per Tube QPM  . sertraline  50 mg Per Tube QHS  . sodium chloride flush  3 mL Intravenous Q12H   Continuous Infusions: . sodium chloride 10 mL/hr at 01/22/18 1900  . feeding supplement (VITAL HIGH PROTEIN) 1,000 mL (01/25/18 1517)  . fentaNYL infusion INTRAVENOUS Stopped (01/26/18 1517)  . furosemide (LASIX) infusion 10 mg/hr (01/26/18 0800)  . heparin 1,000 Units/hr (01/26/18 0800)  . norepinephrine (LEVOPHED) Adult infusion     PRN Meds: sodium chloride, acetaminophen, diphenhydrAMINE, docusate, fentaNYL, ipratropium-albuterol, midazolam, morphine injection, ondansetron (ZOFRAN) IV, polyethylene glycol, sodium chloride flush   Vital Signs    Vitals:   01/26/18 0600 01/26/18 0700 01/26/18 0734 01/26/18 0800  BP: (!) 123/58 127/64  132/71  Pulse: 71 61  63  Resp: (!) 26 (!) 26  (!) 26  Temp:   98.3 F (36.8 C)   TempSrc:   Oral   SpO2: 100% 100%  100%  Weight:      Height:        Intake/Output Summary (Last 24 hours) at 01/26/2018 0848 Last data filed at 01/26/2018 0800 Gross per 24 hour  Intake 2040.67 ml  Output 3875 ml  Net -1834.33 ml  Last 3 Weights 01/26/2018 01/25/2018 01/24/2018  Weight (lbs) 222 lb 14.2 oz 227 lb 11.8 oz 231 lb 7.7 oz  Weight (kg) 101.1 kg 103.3 kg 105 kg      Telemetry    NSR  - Personally Reviewed  ECG     NSR  - Personally Reviewed  Physical Exam   GEN: Elderly female, she appears to be older than her stated age. Neck: No JVD Cardiac: RRR, 3/6 systolic ejection murmur consistent with aortic stenosis. Respiratory:  Vent. GI: Soft, nontender, non-distended  MS: No edema; No deformity. Neuro:  Nonfocal  Psych: Normal affect   Labs    Chemistry Recent Labs  Lab 01/24/18 0414 01/25/18 0440 01/26/18 0431  NA 140 140 138  K 5.0 4.1 3.9  CL 109 104 101  CO2 '24 26 28  ' GLUCOSE 178* 197* 325*  BUN 73* 81* 86*  CREATININE 1.75* 1.61* 1.40*  CALCIUM 8.5* 8.5* 8.3*    GFRNONAA 29* 32* 37*  GFRAA 33* 37* 43*  ANIONGAP '7 10 9     ' Hematology Recent Labs  Lab 01/24/18 0414 01/25/18 0440 01/26/18 0431  WBC 11.3* 11.0* 8.5  RBC 2.62* 2.66* 2.63*  HGB 8.0* 8.1* 7.9*  HCT 26.5* 26.5* 25.8*  MCV 101.1* 99.6 98.1  MCH 30.5 30.5 30.0  MCHC 30.2 30.6 30.6  RDW 16.5* 16.3* 16.3*  PLT 228 199 184    Cardiac Enzymes Recent Labs  Lab 01/22/18 0736 01/22/18 1414 01/22/18 2109 01/23/18 0608  TROPONINI 2.66* 3.91* 10.71* 21.39*   No results for input(s): TROPIPOC in the last 168 hours.   BNP Recent Labs  Lab 01/22/18 0155  BNP 2,778.0*     DDimer No results for input(s): DDIMER in the last 168 hours.   Radiology    No results found.  Cardiac Studies     Patient Profile     73 y.o. female with a known history of severe aortic stenosis and history of diffuse small vessel three-vessel coronary artery disease. Notes  from Dr. Wyline Copas suggest that she is not a good candidate for coronary artery bypass grafting because of lack of sufficient targets.  And was to proceed with TAVR.  I have called Dr. Rhetta Mura office today to discuss with him but he is on vacation until Feb. [place]    1.  Respiratory failure: Patient seems to be making progress.  The critical care teams hope is that she can be extubated today.   2.  Severe aortic stenosis: The patient is thought to have low output low gradient severe aortic stenosis. An aortic valve gradient was 31 by last echo.  Left ventricular ejection fraction is 35%. We will need to allow her to improve some.  We will need to see what her functional status is.  At present he is not medically stable enough to have any cardiac procedures.  3.  Coronary disease: Heart catheterization reported reveals severe diffuse small vessel disease.  She had a non-ST segment elevation myocardial infarction.  She may need another heart catheterization before we decide to do any further procedures.       For questions or updates, please contact [place] Please consult [email or web address] for contact info under        Signed, Mertie Moores, MD  01/26/2018, 8:48 AM

## 2018-01-27 ENCOUNTER — Inpatient Hospital Stay (HOSPITAL_COMMUNITY): Payer: Medicare Other

## 2018-01-27 LAB — HEPARIN LEVEL (UNFRACTIONATED): Heparin Unfractionated: 0.55 IU/mL (ref 0.30–0.70)

## 2018-01-27 LAB — CBC
HCT: 27.4 % — ABNORMAL LOW (ref 36.0–46.0)
Hemoglobin: 8.2 g/dL — ABNORMAL LOW (ref 12.0–15.0)
MCH: 29.6 pg (ref 26.0–34.0)
MCHC: 29.9 g/dL — ABNORMAL LOW (ref 30.0–36.0)
MCV: 98.9 fL (ref 80.0–100.0)
Platelets: 216 10*3/uL (ref 150–400)
RBC: 2.77 MIL/uL — ABNORMAL LOW (ref 3.87–5.11)
RDW: 15.9 % — ABNORMAL HIGH (ref 11.5–15.5)
WBC: 7.8 10*3/uL (ref 4.0–10.5)
nRBC: 0 % (ref 0.0–0.2)

## 2018-01-27 LAB — BASIC METABOLIC PANEL
Anion gap: 12 (ref 5–15)
BUN: 89 mg/dL — ABNORMAL HIGH (ref 8–23)
CO2: 28 mmol/L (ref 22–32)
Calcium: 8.9 mg/dL (ref 8.9–10.3)
Chloride: 101 mmol/L (ref 98–111)
Creatinine, Ser: 1.36 mg/dL — ABNORMAL HIGH (ref 0.44–1.00)
GFR calc Af Amer: 45 mL/min — ABNORMAL LOW (ref >60)
GFR calc non Af Amer: 39 mL/min — ABNORMAL LOW (ref >60)
Glucose, Bld: 148 mg/dL — ABNORMAL HIGH (ref 70–99)
Potassium: 3.9 mmol/L (ref 3.5–5.1)
Sodium: 141 mmol/L (ref 135–145)

## 2018-01-27 LAB — GLUCOSE, CAPILLARY
Glucose-Capillary: 176 mg/dL — ABNORMAL HIGH (ref 70–99)
Glucose-Capillary: 150 mg/dL — ABNORMAL HIGH (ref 70–99)
Glucose-Capillary: 114 mg/dL — ABNORMAL HIGH (ref 70–99)
Glucose-Capillary: 128 mg/dL — ABNORMAL HIGH (ref 70–99)
Glucose-Capillary: 174 mg/dL — ABNORMAL HIGH (ref 70–99)
Glucose-Capillary: 149 mg/dL — ABNORMAL HIGH (ref 70–99)

## 2018-01-27 MED ORDER — GABAPENTIN 250 MG/5ML PO SOLN: 300.0000 mg | Freq: Every day | ORAL | Status: DC

## 2018-01-27 MED ORDER — ENSURE ENLIVE PO LIQD
237.0000 mL | Freq: Two times a day (BID) | ORAL | Status: DC
  Administered 2018-01-27 – 2018-01-28 (×2): 237 mL via ORAL

## 2018-01-27 MED ORDER — METOPROLOL TARTRATE 50 MG PO TABS
50.0000 mg | ORAL_TABLET | Freq: Once | ORAL | Status: AC
  Administered 2018-01-28: 50 mg via ORAL
  Filled 2018-01-27: qty 1

## 2018-01-27 MED ORDER — POLYETHYLENE GLYCOL 3350 17 G PO PACK: 17.0000 g | PACK | Freq: Every day | ORAL | Status: DC | PRN

## 2018-01-27 MED ORDER — METOPROLOL TARTRATE 25 MG/10 ML ORAL SUSPENSION: 50.0000 mg | Freq: Every day | ORAL | Status: DC

## 2018-01-27 MED ORDER — FAMOTIDINE 20 MG PO TABS
20.0000 mg | ORAL_TABLET | Freq: Two times a day (BID) | ORAL | Status: DC
  Administered 2018-01-27 – 2018-01-29 (×4): 20 mg via ORAL
  Filled 2018-01-27 (×4): qty 1

## 2018-01-27 MED ORDER — LEVETIRACETAM 500 MG PO TABS
1000.0000 mg | ORAL_TABLET | Freq: Two times a day (BID) | ORAL | Status: DC
  Administered 2018-01-27 – 2018-01-29 (×4): 1000 mg via ORAL
  Filled 2018-01-27 (×4): qty 2

## 2018-01-27 MED ORDER — TORSEMIDE 20 MG PO TABS
30.0000 mg | ORAL_TABLET | Freq: Every day | ORAL | Status: DC
  Administered 2018-01-27 – 2018-01-29 (×3): 30 mg via ORAL
  Filled 2018-01-27 (×2): qty 2
  Filled 2018-01-27: qty 1

## 2018-01-27 MED ORDER — LOSARTAN POTASSIUM 25 MG PO TABS
25.0000 mg | ORAL_TABLET | Freq: Every day | ORAL | Status: DC
  Administered 2018-01-28 – 2018-01-29 (×2): 25 mg via ORAL
  Filled 2018-01-27 (×2): qty 1

## 2018-01-27 MED ORDER — LEVETIRACETAM IN NACL 1000 MG/100ML IV SOLN
1000.0000 mg | Freq: Two times a day (BID) | INTRAVENOUS | Status: DC
  Administered 2018-01-27: 1000 mg via INTRAVENOUS
  Filled 2018-01-27 (×2): qty 100

## 2018-01-27 MED ORDER — ALPRAZOLAM 0.5 MG PO TABS
0.5000 mg | ORAL_TABLET | Freq: Every day | ORAL | Status: DC
  Administered 2018-01-27 – 2018-01-28 (×2): 0.5 mg via ORAL
  Filled 2018-01-27 (×2): qty 1

## 2018-01-27 MED ORDER — TORSEMIDE 20 MG PO TABS: 20.0000 mg | ORAL_TABLET | Freq: Every day | ORAL | Status: DC

## 2018-01-27 MED ORDER — METOPROLOL TARTRATE 50 MG PO TABS: 50.0000 mg | ORAL_TABLET | Freq: Once | ORAL | Status: DC

## 2018-01-27 MED ORDER — ENOXAPARIN SODIUM 40 MG/0.4ML ~~LOC~~ SOLN
40.0000 mg | SUBCUTANEOUS | Status: DC
  Administered 2018-01-27 – 2018-01-28 (×2): 40 mg via SUBCUTANEOUS
  Filled 2018-01-27 (×2): qty 0.4

## 2018-01-27 MED ORDER — SERTRALINE HCL 50 MG PO TABS
50.0000 mg | ORAL_TABLET | Freq: Every day | ORAL | Status: DC
  Administered 2018-01-27 – 2018-01-28 (×2): 50 mg via ORAL
  Filled 2018-01-27 (×2): qty 1

## 2018-01-27 MED ORDER — ACETAMINOPHEN 325 MG PO TABS
650.0000 mg | ORAL_TABLET | ORAL | Status: DC | PRN
  Administered 2018-01-29: 650 mg via ORAL
  Filled 2018-01-27: qty 2

## 2018-01-27 MED ORDER — ASPIRIN 81 MG PO CHEW
81.0000 mg | CHEWABLE_TABLET | Freq: Every day | ORAL | Status: DC
  Administered 2018-01-28 – 2018-01-29 (×2): 81 mg via ORAL
  Filled 2018-01-27 (×2): qty 1

## 2018-01-27 MED ORDER — ROSUVASTATIN CALCIUM 20 MG PO TABS
20.0000 mg | ORAL_TABLET | Freq: Every evening | ORAL | Status: DC
  Administered 2018-01-27 – 2018-01-28 (×2): 20 mg via ORAL
  Filled 2018-01-27 (×2): qty 1

## 2018-01-27 MED ORDER — GABAPENTIN 300 MG PO CAPS
300.0000 mg | ORAL_CAPSULE | Freq: Every day | ORAL | Status: DC
  Administered 2018-01-27 – 2018-01-28 (×2): 300 mg via ORAL
  Filled 2018-01-27 (×2): qty 1

## 2018-01-27 NOTE — Evaluation (Addendum)
Occupational Therapy Evaluation Patient Details Name: [name] MRN: [id number] DOB: Dec 16, 1945 Today's Date: 01/27/2018    History of Present Illness Pt is a 73 year old woman admitted 01/22/18 with acute on chronic CHF and respiratory failure. Intubated 1/19-1/23. PMH: severe aortic stenosis, small vessel disease and 3 vessel CAD, DM, depression, anxiety, CKD, chronic anemia, HTN, chronic 02 use, seizures.   Clinical Impression   Pt was living her her elderly husband, walking short distances in her house and dependent in much of her bathing and dressing. Pt presents with impaired cognition, generalized weakness and impaired standing balance. VSS on 3L 02 with pt able to stand with +2 min assist and RW. Husband is not able to manage pt's many meds or physically assist her at her current level. Recommending SNF for rehab. Will follow acutely    Follow Up Recommendations  SNF;Supervision/Assistance - 24 hour    Equipment Recommendations  Hospital bed    Recommendations for Other Services       Precautions / Restrictions Precautions Precautions: Fall Restrictions Weight Bearing Restrictions: No      Mobility Bed Mobility Overal bed mobility: Needs Assistance Bed Mobility: Supine to Sit;Sit to Supine     Supine to sit: +2 for physical assistance;Mod assist     General bed mobility comments: cues for sequencing, assist for R LE over EOB, to raise trunk and position hips at EOB, assist for LEs back into bed  Transfers Overall transfer level: Needs assistance Equipment used: Rolling walker (2 wheeled) Transfers: Sit to/from Stand Sit to Stand: +2 physical assistance;Min assist         General transfer comment: assist to rise and steady    Balance Overall balance assessment: Needs assistance   Sitting balance-Leahy Scale: Fair       Standing balance-Leahy Scale: Poor                             ADL either performed or assessed with clinical judgement    ADL Overall ADL's : Needs assistance/impaired Eating/Feeding: Set up;Bed level   Grooming: Set up;Minimal assistance;Sitting   Upper Body Bathing: Moderate assistance;Sitting   Lower Body Bathing: Total assistance;+2 for physical assistance;Sit to/from stand   Upper Body Dressing : Moderate assistance;Sitting   Lower Body Dressing: Total assistance;Sit to/from stand;+2 for physical assistance       Toileting- Clothing Manipulation and Hygiene: Total assistance;+2 for physical assistance;Sit to/from stand       Functional mobility during ADLs: Rolling walker;Cueing for sequencing;+2 for physical assistance;Moderate assistance(took steps along EOB)       Vision Patient Visual Report: No change from baseline       Perception     Praxis      Pertinent Vitals/Pain Pain Assessment: Faces Faces Pain Scale: Hurts little more Pain Location: R heel Pain Descriptors / Indicators: Sore Pain Intervention(s): Repositioned;Monitored during session     Hand Dominance Right   Extremity/Trunk Assessment Upper Extremity Assessment Upper Extremity Assessment: Generalized weakness   Lower Extremity Assessment Lower Extremity Assessment: Defer to PT evaluation   Cervical / Trunk Assessment Cervical / Trunk Assessment: Other exceptions(obesity)   Communication Communication Communication: Expressive difficulties(hoarse and at times having low volume)   Cognition Arousal/Alertness: Awake/alert Behavior During Therapy: Flat affect Overall Cognitive Status: Impaired/Different from baseline Area of Impairment: Orientation;Memory;Following commands;Safety/judgement;Problem solving                 Orientation Level: Disoriented to;Time;Situation  Memory: Decreased short-term memory Following Commands: Follows one step commands with increased time Safety/Judgement: Decreased awareness of safety;Decreased awareness of deficits   Problem Solving: Slow  processing;Difficulty sequencing;Requires verbal cues     General Comments       Exercises     Shoulder Instructions      Home Living Family/patient expects to be discharged to:: Private residence Living Arrangements: Spouse/significant other Available Help at Discharge: Family;Available 24 hours/day Type of Home: House Home Access: Level entry     Home Layout: One level     Bathroom Shower/Tub: Tub/shower unit         Home Equipment: Cane - single point;Walker - 2 wheels;Wheelchair - manual;Shower seat;Bedside commode   Additional Comments: Husband cannot assist pt at home.      Prior Functioning/Environment Level of Independence: Needs assistance  Gait / Transfers Assistance Needed: reports she was walking short distances with RW ADL's / Homemaking Assistance Needed: self fed, participated in grooming, otherwise needing assistance for bathing, dressing, toileting   Comments: husband in room and contributing to PLOF        OT Problem List: Decreased activity tolerance;Impaired balance (sitting and/or standing);Pain;Decreased cognition;Decreased knowledge of use of DME or AE;Cardiopulmonary status limiting activity;Obesity;Decreased strength      OT Treatment/Interventions: Self-care/ADL training;Neuromuscular education;Patient/family education;Balance training;Cognitive remediation/compensation;DME and/or AE instruction    OT Goals(Current goals can be found in the care plan section) Acute Rehab OT Goals Patient Stated Goal: to go home, but agreeable to further rehab in SNF OT Goal Formulation: With patient Time For Goal Achievement: 02/10/18 Potential to Achieve Goals: Good  OT Frequency: Min 2X/week   Barriers to D/C: Decreased caregiver support          Co-evaluation PT/OT/SLP Co-Evaluation/Treatment: Yes Reason for Co-Treatment: Complexity of the patient's impairments (multi-system involvement);For patient/therapist safety   OT goals addressed during  session: ADL's and self-care      AM-PAC OT "6 Clicks" Daily Activity     Outcome Measure Help from another person eating meals?: A Little Help from another person taking care of personal grooming?: A Little Help from another person toileting, which includes using toliet, bedpan, or urinal?: Total Help from another person bathing (including washing, rinsing, drying)?: A Lot Help from another person to put on and taking off regular upper body clothing?: A Lot Help from another person to put on and taking off regular lower body clothing?: Total 6 Click Score: 12   End of Session Equipment Utilized During Treatment: Gait belt;Rolling walker;Oxygen(3L) Nurse Communication: Mobility status  Activity Tolerance: Patient tolerated treatment well Patient left: in bed;with call bell/phone within reach;with family/visitor present  OT Visit Diagnosis: Unsteadiness on feet (R26.81);Other abnormalities of gait and mobility (R26.89);Pain;Muscle weakness (generalized) (M62.81)                Time: 7544-9201 OT Time Calculation (min): 27 min Charges:  OT General Charges $OT Visit: 1 Visit OT Evaluation $OT Eval Moderate Complexity: 1 Mod  Nestor Lewandowsky, OTR/L Acute Rehabilitation Services Pager: [phone] Office: [phone]  Malka So 01/27/2018, 1:08 PM

## 2018-01-27 NOTE — Progress Notes (Signed)
NAME:  [name], MRN:  [id number], DOB:  1945-12-18, LOS: 5 ADMISSION DATE:  01/22/2018, CONSULTATION DATE: January 22, 2018 REFERRING MD: Dr. Thereasa Solo, CHIEF COMPLAINT: Shortness of breath  Brief History   This is a 73 year old female with a history of CAD and aortic stenosis who presented with hypoxic respiratory failure, PCCM consulted for worsening respiratory failure on 01/22/18.  History of present illness   This is a 73 year old female with a history of CAD, DM, HFrEF, HTN, HLD, on 2L supplemental oxygen at home, and other medical issues listed below who was admitted the morning of 01/22/18 for acute respiratory failure with hypoxia. All information was gathered from the chart. She had been having shortness of breath for about 1-2 days prior to admission. In the ED she was given IV diuretics, morphine, and noninvasive mechanical ventilation. Labs showed an elevated BNP to 2778, troponin 2.63, K 5.4, and hgb 9.4 (near baseline). EKG showed sinus tachycardia with ST depressions in leads V2, V3, and T wave inversions in lateral leads. Cardiology was consulted and heparin drip was started. She was admitted to the hospital service however her her respiratory status worsened with worsening hypoxia requiring intubation and she was transferred to the ICU.  Central line was placed in her left IJ.  She was also noted to have bruising around her eyes, heparin was held and CT head was ordered. CT head showed no acute findings so heparin restarted.   Spoke with daughter and husband at bedside. Daughter reports that [name] had been living with her for the past 5 months until about 2 weeks ago when she went home. The daughter had been helping with her medications however she thinks that the patient was not taking her fluid pills. She also reports that the patient had taken 15 Xanax within the past couple of days. She has been being evaluated for a TAVR however apparently she had decided not to get it. The  patient had expressed to the daughter and husband that she did not want any CPR or serious interventions prior to this hospitalization however apparently had changed her mind yesterday when she agreed to be intubated.  They report that she has been having shortenss of breath for the past 2-3 days, and has had some orthopnea. She wears 3L [place] at home.  They report that her dry weight is around 219.   Past Medical History  Coronary artery disease Aortic stenosis Chronic kidney disease Diabetes mellitus Morbid obesity  Significant Hospital Events   01/22/18 > ICU  Consults:  Cardiology  Procedures:  01/22/18 > Central line placement 01/22/18 > intubated 1/23 > extubated   Significant Diagnostic Tests:  July 2019 echocardiogram showed mild LVH, LVEF 45 to 50%, moderate aortic valve stenosis, mild aortic valve regurgitation, mitral valve with mild regurgitation, right atrium mildly dilated, PA pressure 57 mmHg  CT head 01/22/18: IMPRESSION: Atrophy and chronic microvascular ischemic change in the white matter. No acute abnormality.  Micro Data:    Antimicrobials:     Interim history/subjective:  Patient had been extubated yesterday and is doing well today.  No acute events overnight.  She did not pass her swallow test yesterday however swallow test was passed today.  She is still having some hoarseness however she denies any shortness of breath difficulty breathing.  Did discuss that she does have severe aortic stenosis and discussed the possibility of a procedure to repair that valve.  She reported that she does not want  any procedures done.  We discussed that given the extent of her disease she will likely have a recurrence of heart failure and she is aware of that but still reports that she doesn't want anything done.  CVP today was 13.   Objective   Blood pressure 127/70, pulse 72, temperature 97.8 F (36.6 C), temperature source Oral, resp. rate 20, height 4\' 11"  (1.499 m),  weight 99.7 kg, SpO2 100 %. CVP:  [7 mmHg] 7 mmHg  Vent Mode: CPAP;PSV FiO2 (%):  [40 %] 40 % PEEP:  [5 cmH20] 5 cmH20 Pressure Support:  [8 cmH20] 8 cmH20   Intake/Output Summary (Last 24 hours) at 01/27/2018 0651 Last data filed at 01/27/2018 0600 Gross per 24 hour  Intake 1218.43 ml  Output 1900 ml  Net -681.57 ml   Filed Weights   01/25/18 0323 01/26/18 0500 01/27/18 0240  Weight: 103.3 kg 101.1 kg 99.7 kg    Examination: General: Frail-appearing female, awake and alert HENT: Normocephalic, atraumatic Lungs: Minimal bibasilar crackles, normal work of breathing Cardiovascular: RRR, systolic murmur, JVD difficult to assess Abdomen: Soft, non-tender, no gaurding Extremities: 1+ BL LE edema Neuro: Sedated, following commands GU: Foley in place   Resolved Hospital Problem list     Assessment & Plan:    Hypercarbic and hypoxic respiratory failure 2/2 acute pulmonary edema HFrEF: Echo in July showed EF 45-50% Respiratory acidosis -Patient has had improvement in her heart failure cessation.  She has had good diuresis and is now extubated.  She has had a net output of -600 yesterday.  Today her CVP is 13 and she still has some bibasilar crackles it appears that she may still have some fluid overload.  She may have been having a poor absorption of the Lasix so we will do a trial of torsemide today.  Exacerbation of heart failure may have been related to medication noncompliance versus worsening of her cardiac function.  Had discussed the possibility of a TAVR however states that she does not want any cardiac procedure.  Given that there is a possibility of a recurrence of heart failure exacerbation we discussed involving palliative care to evaluate her goals of care is in agreement. -Echocardiogram showed EF of 35%, mild LVH, grade 2 diastolic dysfunction, moderate to severe aortic stenosis, diffuse hypokinesis. Dilated IVC.  -Discontinue fentanyl and PRN midazolam for  sedation -Discontinue lasix  -Start torsemide 30 mg daily -Strict I and Os, daily weight -Palliative care consult -PT OT -Stable to transfer out of ICU  Aortic stenosis Elevated troponin 2/2 Demand ischemia with CAD, not consistent with ACS -Cardiology on board, appreciate recommendations.  Patient reports that she does not want any cardiac procedures.  Will continue medical management at this time. -Telemetry monitoring -ASA -Discontinue Heparin drip -Hold Imdur -Hold metoprolol  CKD: -Cr has been improving, had a slight bump likely 2/2 her heart failure exacerbation.  -Repeat BMP in AM  Hyperkalemia: -Resolved -Daily BMP to monitor  Macrocytic anemia: -On admission Hgb 9.4 and MCV 101.2. Today Hgb 8.2 -No signs of active bleeding -Transfuse for <7  Diabetes mellitus: -Patient is on Levemir and novolog at home.  She is resuming a diet today. -Levemir 8 units daily -SSI -Frequent CBGs  Seizure disorder: -On Keppra at home, continue this  Best practice:  Diet: Dysphagia diet  Pain/Anxiety/Delirium protocol (if indicated): No VAP protocol (if indicated): No DVT prophylaxis: SCDs GI prophylaxis: Famotidine Glucose control: SSI Mobility: Bedrest Code Status: Full code Family Communication: Spoke with patient and husband  Disposition: Transfer to telemetry  Labs   CBC: Recent Labs  Lab 01/22/18 0155 01/23/18 3532 01/24/18 0414 01/25/18 0440 01/26/18 0431 01/27/18 0526  WBC 9.5 8.0 11.3* 11.0* 8.5 7.8  NEUTROABS 6.3  --   --   --   --   --   HGB 9.4* 8.0* 8.0* 8.1* 7.9* 8.2*  HCT 32.7* 27.2* 26.5* 26.5* 25.8* 27.4*  MCV 101.2* 104.2* 101.1* 99.6 98.1 98.9  PLT 298 241 228 199 184 992    Basic Metabolic Panel: Recent Labs  Lab 01/22/18 0736 01/22/18 2109  01/23/18 0608 01/23/18 1536 01/24/18 0414 01/25/18 0440 01/26/18 0431 01/27/18 0526  NA  --   --    < > 140  --  140 140 138 141  K  --   --    < > 5.5*  --  5.0 4.1 3.9 3.9  CL  --   --    <  > 109  --  109 104 101 101  CO2  --   --    < > 21*  --  24 26 28 28   GLUCOSE  --   --    < > 176*  --  178* 197* 325* 148*  BUN  --   --    < > 60*  --  73* 81* 86* 89*  CREATININE  --   --    < > 1.74*  --  1.75* 1.61* 1.40* 1.36*  CALCIUM  --   --    < > 8.6*  --  8.5* 8.5* 8.3* 8.9  MG 2.1 2.1  --  2.0 2.1  --   --   --   --   PHOS 5.2* 4.6  --  5.0* 4.2  --   --   --   --    < > = values in this interval not displayed.   GFR: Estimated Creatinine Clearance: 38.8 mL/min (A) (by C-G formula based on SCr of 1.36 mg/dL (H)). Recent Labs  Lab 01/24/18 0414 01/25/18 0440 01/26/18 0431 01/27/18 0526  WBC 11.3* 11.0* 8.5 7.8    Liver Function Tests: No results for input(s): AST, ALT, ALKPHOS, BILITOT, PROT, ALBUMIN in the last 168 hours. No results for input(s): LIPASE, AMYLASE in the last 168 hours. No results for input(s): AMMONIA in the last 168 hours.  ABG    Component Value Date/Time   PHART 7.320 (L) 01/22/2018 2149   PCO2ART 46.1 01/22/2018 2149   PO2ART 197.0 (H) 01/22/2018 2149   HCO3 24.0 01/22/2018 2149   TCO2 25 01/22/2018 2149   ACIDBASEDEF 2.0 01/22/2018 2149   O2SAT 100.0 01/22/2018 2149     Coagulation Profile: Recent Labs  Lab 01/22/18 2109  INR 1.35    Cardiac Enzymes: Recent Labs  Lab 01/22/18 0155 01/22/18 0736 01/22/18 1414 01/22/18 2109 01/23/18 0608  TROPONINI 2.63* 2.66* 3.91* 10.71* 21.39*    HbA1C: Hgb A1c MFr Bld  Date/Time Value Ref Range Status  10/31/2017 10:35 AM 7.6 (H) 4.6 - 6.5 % Final    Comment:    Glycemic Control Guidelines for People with Diabetes:Non Diabetic:  <6%Goal of Therapy: <7%Additional Action Suggested:  >8%   07/22/2017 02:06 PM 7.9 (H) 4.8 - 5.6 % Final    Comment:    (NOTE) Pre diabetes:          5.7%-6.4% Diabetes:              >6.4% Glycemic control for   <7.0% adults with  diabetes     CBG: Recent Labs  Lab 01/26/18 1144 01/26/18 1538 01/26/18 1936 01/26/18 2323 01/27/18 0402  GLUCAP  228* 173* 162* 176* 150*    Review of Systems:   Unable to obtain due to patients mental status.   Past Medical History  She,  has a past medical history of Anxiety, Coronary artery disease, Depression, Diabetes mellitus, Edema of lower extremity, Esophageal stricture (2008), Fatigue, GERD (gastroesophageal reflux disease), Heart murmur, Hiatal hernia (2008), Hyperlipidemia, Hypertension, Knee pain, Morbid obesity ([place]), Nonproductive cough, Orthopnea, Seizure disorder ([place]), SOB (shortness of breath), Syncope and collapse, and Tremor.   Surgical History    Past Surgical History:  Procedure Laterality Date  . ABDOMINAL HYSTERECTOMY    . CARDIAC CATHETERIZATION  03/28/2009  . KNEE ARTHROSCOPY    . TRANSTHORACIC ECHOCARDIOGRAM     showed ef of 65% with no regional wall motion abnormalities     Social History   reports that she quit smoking about 47 years ago. She has never used smokeless tobacco. She reports that she does not drink alcohol or use drugs.   Family History   Her family history includes COPD in her father; Diabetes in her brother and brother; Heart attack in her mother; Heart disease in her sister; Hypertension in her mother; Kidney disease in her brother; Prostate cancer in her brother.   Allergies Allergies  Allergen Reactions  . Codeine Itching     Home Medications  Prior to Admission medications   Medication Sig Start Date End Date Taking? Authorizing Provider  acetaminophen (TYLENOL) 500 MG tablet Take 1,000 mg by mouth 2 (two) times daily. For leg pain   Yes [provider]  ALPRAZolam (XANAX) 0.5 MG tablet Take 1 tablet (0.5 mg total) by mouth at bedtime. 10/31/17  Yes Briscoe Deutscher, DO  aspirin 325 MG EC tablet Take 1 tablet (325 mg total) by mouth daily. 10/04/17  Yes Briscoe Deutscher, DO  clopidogrel (PLAVIX) 75 MG tablet TAKE ONE TABLET BY MOUTH DAILY Patient taking differently: Take 75 mg by mouth at bedtime.  11/08/17  Yes Briscoe Deutscher, DO   collagenase (SANTYL) ointment Apply 1 application topically daily. Patient taking differently: Apply 1 application topically daily. Apply to feet 10/14/17  Yes Briscoe Deutscher, DO  furosemide (LASIX) 20 MG tablet TAKE ONE TABLET BY MOUTH TWICE A DAY Patient taking differently: Take 20 mg by mouth daily.  12/14/17  Yes Briscoe Deutscher, DO  gabapentin (NEURONTIN) 300 MG capsule TAKE ONE CAPSULE BY MOUTH AT BEDTIME Patient taking differently: Take 300 mg by mouth at bedtime.  01/16/18  Yes Briscoe Deutscher, DO  insulin detemir (LEVEMIR) 100 unit/ml SOLN Inject 15 Units into the skin at bedtime.   Yes [provider]  isosorbide mononitrate (IMDUR) 60 MG 24 hr tablet Take 1 tablet (60 mg total) by mouth daily. 10/04/17  Yes Briscoe Deutscher, DO  levETIRAcetam (KEPPRA) 1000 MG tablet TAKE 1 TABLET BY MOUTH TWICE DAILY Patient taking differently: Take 1,000 mg by mouth 2 (two) times daily.  12/13/17  Yes Briscoe Deutscher, DO  losartan (COZAAR) 25 MG tablet TAKE ONE TABLET BY MOUTH DAILY Patient taking differently: Take 25 mg by mouth at bedtime.  11/14/17  Yes Briscoe Deutscher, DO  Melatonin 3 MG TABS Take 1 tablet (3 mg total) by mouth at bedtime. 08/26/17  Yes Briscoe Deutscher, DO  metoprolol tartrate (LOPRESSOR) 50 MG tablet TAKE 1 TABLET BY MOUTH ONCE DAILY Patient taking differently: Take 50 mg by mouth daily.  12/13/17  Yes Briscoe Deutscher, DO  ondansetron (ZOFRAN) 4 MG tablet Take 1 tablet (4 mg total) by mouth every 6 (six) hours as needed for nausea or vomiting. 08/26/17  Yes Briscoe Deutscher, DO  oxymetazoline (AFRIN) 0.05 % nasal spray Place 1 spray into both nostrils 2 (two) times daily as needed (nosebleeds). 08/26/17  Yes Briscoe Deutscher, DO  potassium chloride (K-DUR) 10 MEQ tablet TAKE ONE TABLET BY MOUTH DAILY Patient taking differently: Take 10 mEq by mouth daily.  12/14/17  Yes Briscoe Deutscher, DO  rizatriptan (MAXALT) 10 MG tablet TAKE 1 TABLET BY MOUTH DAILY AS NEEDED FOR CHRONIC   PAIN Patient taking differently: Take 10 mg by mouth daily as needed for migraine.  12/17/17  Yes Briscoe Deutscher, DO  rosuvastatin (CRESTOR) 20 MG tablet Take 1 tablet (20 mg total) by mouth every evening. Patient taking differently: Take 20 mg by mouth at bedtime.  10/04/17  Yes Briscoe Deutscher, DO  sertraline (ZOLOFT) 50 MG tablet Take 1 tablet (50 mg total) by mouth at bedtime. 10/31/17  Yes Briscoe Deutscher, DO  sodium chloride (OCEAN) 0.65 % SOLN nasal spray Place 2 sprays into both nostrils 3 (three) times daily. Patient taking differently: Place 2 sprays into both nostrils 3 (three) times daily as needed for congestion.  08/26/17  Yes Briscoe Deutscher, DO  traMADol (ULTRAM) 50 MG tablet Take 1 tablet (50 mg total) by mouth every 6 (six) hours as needed. Patient taking differently: Take 50 mg by mouth every 6 (six) hours as needed (pain).  12/16/17  Yes Briscoe Deutscher, DO  acetaminophen (TYLENOL) 325 MG tablet Take 2 tablets (650 mg total) by mouth every 4 (four) hours as needed for headache or mild pain. Patient not taking: Reported on 01/22/2018 09/12/16   Robbie Lis, MD  albuterol (PROVENTIL HFA;VENTOLIN HFA) 108 [phone] Base) MCG/ACT inhaler Inhale 2 puffs into the lungs every 6 (six) hours as needed for wheezing or shortness of breath. Patient not taking: Reported on 01/22/2018 10/31/17   Briscoe Deutscher, DO  glucose blood (ONETOUCH VERIO) test strip 1 each by Other route daily. And lancets 1/day. Dx code E11.9. 08/26/17   Briscoe Deutscher, DO  insulin aspart (NOVOLOG) 100 UNIT/ML injection Inject 0-9 Units into the skin 3 (three) times daily with meals. Sliding scale CBG 70 - 120: 0 units CBG 121 - 150: 1 unit,  CBG 151 - 200: 2 units,  CBG 201 - 250: 3 units,  CBG 251 - 300: 5 units,  CBG 301 - 350: 7 units,  CBG 351 - 400: 9 units   CBG > 400: 9 units and notify your MD Patient not taking: Reported on 01/22/2018 08/26/17   Briscoe Deutscher, DO  insulin detemir (LEVEMIR) 100 UNIT/ML injection Inject 0.1  mLs (10 Units total) into the skin daily. Patient not taking: Reported on 01/22/2018 08/26/17   Briscoe Deutscher, DO  loratadine (CLARITIN) 10 MG tablet Take 1 tablet (10 mg total) by mouth daily as needed for allergies. Patient not taking: Reported on 01/22/2018 08/26/17   Briscoe Deutscher, DO  polyethylene glycol [facility] / GLYCOLAX) packet Take 17 g by mouth daily as needed for mild constipation. Patient not taking: Reported on 01/22/2018 08/26/17   Briscoe Deutscher, DO  potassium chloride (K-DUR,KLOR-CON) 10 MEQ tablet Take 1 tablet (10 mEq total) by mouth daily. Patient not taking: Reported on 01/22/2018 10/04/17   Briscoe Deutscher, DO  SANTYL ointment APPLY ONE APPLICATION TOPICALLY DAILY Patient not taking: Reported on 01/22/2018 11/08/17   Briscoe Deutscher,  DO        Asencion Noble, M.D. PGY1 Pager [phone] 01/27/2018 6:51 AM

## 2018-01-27 NOTE — Evaluation (Signed)
Clinical/Bedside Swallow Evaluation Patient Details  Name: [name] MRN: [id number] Date of Birth: April 09, 1945  Today's Date: 01/27/2018 Time: SLP Start Time (ACUTE ONLY): 9767 SLP Stop Time (ACUTE ONLY): 0950 SLP Time Calculation (min) (ACUTE ONLY): 45 min  Past Medical History:  Past Medical History:  Diagnosis Date  . Anxiety   . Coronary artery disease   . Depression   . Diabetes mellitus    insulin dependent  . Edema of lower extremity   . Esophageal stricture 2008   EGD  . Fatigue   . GERD (gastroesophageal reflux disease)   . Heart murmur   . Hiatal hernia 2008   EGD  . Hyperlipidemia   . Hypertension   . Knee pain   . Morbid obesity ([place])   . Nonproductive cough    chronic  . Orthopnea   . Seizure disorder ([place])   . SOB (shortness of breath)   . Syncope and collapse   . Tremor    Past Surgical History:  Past Surgical History:  Procedure Laterality Date  . ABDOMINAL HYSTERECTOMY    . CARDIAC CATHETERIZATION  03/28/2009  . KNEE ARTHROSCOPY    . TRANSTHORACIC ECHOCARDIOGRAM     showed ef of 65% with no regional wall motion abnormalities   HPI:  Pt is a 73 y.o. female with medical history significant for coronary artery disease, insulin-dependent diabetes mellitus, depression with anxiety, chronic kidney disease stage III, chronic anemia, hypertension, chronic 2 L/min supplemental oxygen requirement, GERD and seizure disorder, presented to the emergency department (1/19) with respiratory distress. Pt reports development and worsening dyspnea over the past day. Pt intubated from 1/19- 1/23. Previous BSE on 07/22/17 when she was admitted to [facility] for renal failure. BSE results revealed no s/sx of aspiration, but pt did exhibit impulsive eating practices (eating large boluses and stuffing mouth).    Assessment / Plan / Recommendation Clinical Impression  Upon observation, pt's vocal quality was hoarse and breathy, suggesting pharyngeal/laryngeal irritation from  prolonged intubation and possibly decreased airway protection. Prolonged intubation and hx of respiratory failure and GERD place pt at risk for aspiration. While pt passed 3oz water test and appeared to tolerate other textures (puree, regular solid), she exhibited delayed coughing which suggests possible silent aspiration. Recommend MBS for f/u assessment.   SLP Visit Diagnosis: Dysphagia, oropharyngeal phase (R13.12)    Aspiration Risk       Diet Recommendation NPO except meds(meds with puree; ice chips as desired)   Medication Administration: Whole meds with puree Supervision: Staff to assist with self feeding;Patient able to self feed    Other  Recommendations Oral Care Recommendations: Oral care QID   Follow up Recommendations Skilled Nursing facility      Frequency and Duration            Prognosis Prognosis for Safe Diet Advancement: Good      Swallow Study   General HPI: Pt is a 73 y.o. female with medical history significant for coronary artery disease, insulin-dependent diabetes mellitus, depression with anxiety, chronic kidney disease stage III, chronic anemia, hypertension, chronic 2 L/min supplemental oxygen requirement, GERD and seizure disorder, presented to the emergency department (1/19) with respiratory distress. Pt reports development and worsening dyspnea over the past day. Pt intubated from 1/19- 1/23. Previous BSE on 07/22/17 when she was admitted to [facility] for renal failure. BSE results revealed no s/sx of aspiration, but pt did exhibit impulsive eating practices (eating large boluses and stuffing mouth).  Type of Study: Bedside Swallow Evaluation  Previous Swallow Assessment: Noted in HPI. Diet Prior to this Study: NPO Temperature Spikes Noted: No Respiratory Status: Nasal cannula History of Recent Intubation: Yes Length of Intubations (days): 5 days Date extubated: 01/26/18 Behavior/Cognition: Alert;Cooperative;Pleasant mood Oral Cavity Assessment: Within  Functional Limits Oral Care Completed by SLP: No Oral Cavity - Dentition: Edentulous Vision: Functional for self-feeding Self-Feeding Abilities: Able to feed self;Needs assist Patient Positioning: Upright in bed Baseline Vocal Quality: Breathy;Hoarse Volitional Cough: Weak    Oral/Motor/Sensory Function Overall Oral Motor/Sensory Function: Within functional limits   Ice Chips Ice chips: Impaired Presentation: Spoon;Cup Pharyngeal Phase Impairments: Cough - Delayed   Thin Liquid Thin Liquid: Impaired Presentation: Spoon;Self Fed;Cup;Straw Pharyngeal  Phase Impairments: Cough - Delayed    Nectar Thick Nectar Thick Liquid: Not tested   Honey Thick Honey Thick Liquid: Not tested   Puree Puree: Impaired Presentation: Spoon Pharyngeal Phase Impairments: Cough - Delayed   Solid     Solid: Impaired Presentation: Self Fed Pharyngeal Phase Impairments: Cough - Delayed      Ellis Savage, SLP Student  01/27/2018,11:03 AM

## 2018-01-27 NOTE — Progress Notes (Signed)
Patient and family only want females to help patient

## 2018-01-27 NOTE — Progress Notes (Signed)
ANTICOAGULATION CONSULT NOTE - Follow Up Consult  Pharmacy Consult for Heparin Indication: chest pain/ACS  Allergies  Allergen Reactions  . Codeine Itching    Patient Measurements: Height: _0  (149.9 cm) Weight: 219 lb 12.8 oz (99.7 kg) IBW/kg (Calculated) : 43.2 Heparin Dosing Weight: 75kg  Vital Signs: Temp: 97.8 F (36.6 C) (01/24 0406) Temp Source: Oral (01/24 0406) BP: 127/70 (01/24 0600) Pulse Rate: 72 (01/24 0500)  Labs: Recent Labs    01/25/18 0440 01/25/18 1400 01/26/18 0431 01/27/18 0526  HGB 8.1*  --  7.9* 8.2*  HCT 26.5*  --  25.8* 27.4*  PLT 199  --  184 216  HEPARINUNFRC 0.80* 0.53 0.46 0.55  CREATININE 1.61*  --  1.40* 1.36*    Estimated Creatinine Clearance: 38.8 mL/min (A) (by C-G formula based on SCr of 1.36 mg/dL (H)).   Medications:  Scheduled:  . ALPRAZolam  0.5 mg Per Tube QHS  . aspirin  81 mg Per Tube Daily  . chlorhexidine gluconate (MEDLINE KIT)  15 mL Mouth Rinse BID  . Chlorhexidine Gluconate Cloth  6 each Topical Q0600  . collagenase   Topical Daily  . famotidine  20 mg Per Tube BID  . fentaNYL (SUBLIMAZE) injection  50 mcg Intravenous Once  . furosemide  80 mg Intravenous Once  . gabapentin  300 mg Per Tube QHS  . Gerhardt's butt cream   Topical TID  . insulin aspart  0-15 Units Subcutaneous Q4H  . insulin detemir  8 Units Subcutaneous QHS  . levETIRAcetam  1,000 mg Per Tube BID  . losartan  25 mg Per Tube Daily  . mouth rinse  15 mL Mouth Rinse BID  . metoprolol tartrate  50 mg Per Tube Daily  . metoprolol tartrate  5 mg Intravenous Once  .  morphine injection  2 mg Intravenous Once  . multivitamin  15 mL Per Tube Daily  . mupirocin ointment  1 application Nasal BID  . rosuvastatin  20 mg Per Tube QPM  . sertraline  50 mg Per Tube QHS  . sodium chloride flush  3 mL Intravenous Q12H    Assessment: 73 year old female here with acute respiratory failure due to decompensated HF with LV dysfunction and aortic stenosis.  Patient presented with elevated troponins and concern for ACS.   Heparin level therapeutic this AM at 0.55 Hgb up at 8.2 from 7.9 day prior. Hct up to 27.4 from 25.8.  Platelets increase from 184 to 216. No signs or symptoms of bleeding per nurse.    Goal of Therapy:  Heparin level 0.3-0.7 units/ml Monitor platelets by anticoagulation protocol: Yes   Plan:  Continue current heparin infusion at 1000 units/hr Continue to monitor H&H and platelets Daily Heparin level and CBC in AM  Thank you for allowing pharmacy to be a part of this patient's care.  Tamela Gammon, PharmD 01/27/2018 6:55 AM PGY-1 Pharmacy Resident Direct Phone: [phone] Please check [email or web address] for unit-specific pharmacist phone numbers

## 2018-01-27 NOTE — Evaluation (Signed)
Physical Therapy Evaluation Patient Details Name: [name] MRN: [id number] DOB: [birth year] Today's Date: 01/27/2018   History of Present Illness  Pt is a 73 year old woman admitted 01/22/18 with acute on chronic CHF and respiratory failure. Intubated 1/19-1/23. PMH: severe aortc stenosis, small vessel disease and 3 vessel CAD, DM, depression, anxiety, CKD, chronic anemia, HTN, chronic 02 use, seizures.    Clinical Impression  Pt admitted with above diagnosis. Pt currently with functional limitations due to the deficits listed below (see PT Problem List). PTA, pt patient living at home limited household ambulation. Today pt mod A for bed mobility, standing briefly EOB with min A x2, unable to safely side step or walk without fatigue and premature sit. Will need post acute rehab. Pt will benefit from skilled PT to increase their independence and safety with mobility to allow discharge to the venue listed below.       Follow Up Recommendations SNF    Equipment Recommendations  (TBD next venue)    Recommendations for Other Services       Precautions / Restrictions Precautions Precautions: Fall Restrictions Weight Bearing Restrictions: No      Mobility  Bed Mobility Overal bed mobility: Needs Assistance Bed Mobility: Supine to Sit;Sit to Supine           General bed mobility comments: cues for sequencing, assist for R LE over EOB, to raise trunk and position hips at EOB, assist for LEs back into bed  Transfers Overall transfer level: Needs assistance Equipment used: Rolling walker (2 wheeled) Transfers: Sit to/from Stand Sit to Stand: +2 physical assistance;Min assist         General transfer comment: assist to rise and steady  Ambulation/Gait                Stairs            Wheelchair Mobility    Modified Rankin (Stroke Patients Only)       Balance Overall balance assessment: Needs assistance   Sitting balance-Leahy Scale: Fair        Standing balance-Leahy Scale: Poor                               Pertinent Vitals/Pain Pain Assessment: Faces Faces Pain Scale: Hurts little more Pain Location: R heel Pain Descriptors / Indicators: Sore Pain Intervention(s): Limited activity within patient's tolerance;Monitored during session    Home Living Family/patient expects to be discharged to:: Private residence Living Arrangements: Spouse/significant other Available Help at Discharge: Family;Available 24 hours/day Type of Home: House Home Access: Level entry     Home Layout: One level Home Equipment: Cane - single point;Walker - 2 wheels;Wheelchair - manual;Shower seat;Bedside commode Additional Comments: Husband cannot assist pt at home.    Prior Function Level of Independence: Needs assistance   Gait / Transfers Assistance Needed: reports she was walking short distances with RW  ADL's / Homemaking Assistance Needed: self fed, participated in grooming, otherwise needing assistance for bathing, dressing, toileting  Comments: husband in room and contributing to PLOF     Hand Dominance   Dominant Hand: Right    Extremity/Trunk Assessment   Upper Extremity Assessment Upper Extremity Assessment: Overall WFL for tasks assessed    Lower Extremity Assessment Lower Extremity Assessment: Overall WFL for tasks assessed    Cervical / Trunk Assessment Cervical / Trunk Assessment: Other exceptions  Communication   Communication: Expressive difficulties(low volume hoarse voice)  Cognition  Arousal/Alertness: Awake/alert Behavior During Therapy: Flat affect Overall Cognitive Status: Impaired/Different from baseline Area of Impairment: Orientation;Memory;Following commands;Safety/judgement;Problem solving                 Orientation Level: Disoriented to;Time;Situation   Memory: Decreased short-term memory Following Commands: Follows one step commands with increased time Safety/Judgement:  Decreased awareness of safety;Decreased awareness of deficits   Problem Solving: Slow processing;Difficulty sequencing;Requires verbal cues        General Comments      Exercises     Assessment/Plan    PT Assessment Patient needs continued PT services  PT Problem List Decreased strength       PT Treatment Interventions DME instruction;Gait training;Stair training;Therapeutic activities;Therapeutic exercise;Functional mobility training;Balance training    PT Goals (Current goals can be found in the Care Plan section)  Acute Rehab PT Goals Patient Stated Goal: to go home, but agreeable to further rehab in SNF PT Goal Formulation: With patient Potential to Achieve Goals: Good    Frequency Min 2X/week   Barriers to discharge Decreased caregiver support      Co-evaluation PT/OT/SLP Co-Evaluation/Treatment: Yes Reason for Co-Treatment: Complexity of the patient's impairments (multi-system involvement);Necessary to address cognition/behavior during functional activity           AM-PAC PT "6 Clicks" Mobility  Outcome Measure Help needed turning from your back to your side while in a flat bed without using bedrails?: A Lot Help needed moving from lying on your back to sitting on the side of a flat bed without using bedrails?: A Lot Help needed moving to and from a bed to a chair (including a wheelchair)?: Total Help needed standing up from a chair using your arms (e.g., wheelchair or bedside chair)?: Total Help needed to walk in hospital room?: Total Help needed climbing 3-5 steps with a railing? : Total 6 Click Score: 8    End of Session Equipment Utilized During Treatment: Gait belt Activity Tolerance: Patient tolerated treatment well Patient left: in bed;with call bell/phone within reach;with bed alarm set;with nursing/sitter in room Nurse Communication: Mobility status PT Visit Diagnosis: Unsteadiness on feet (R26.81)    Time: 1140-1210 PT Time Calculation (min)  (ACUTE ONLY): 30 min   Charges:   PT Evaluation $PT Eval Moderate Complexity: 1 Mod         Reinaldo Berber, PT, DPT Acute Rehabilitation Services Pager: [phone] Office: [phone]    Reinaldo Berber 01/27/2018, 4:02 PM

## 2018-01-27 NOTE — Progress Notes (Signed)
Patient breathing with mouth wide open while asleep.  Appears to be struggling for breath, although all numbers are normal and patient denies.  Around 2130 hrs patient asked me if she were going to die.  She was worried that her daughters hadnt visited her.  We spoke for 20  Minutes while I attempted to put her at ease.

## 2018-01-27 NOTE — Progress Notes (Signed)
Nutrition Follow-up  DOCUMENTATION CODES:   Morbid obesity  INTERVENTION:   Ensure Enlive po BID, each supplement provides 350 kcal and 20 grams of protein  Based on po intake on follow-up, may consider changing supplement to Juven BID  D/C Vital High Protein TF D/C Liquid MVI  NUTRITION DIAGNOSIS:   Increased nutrient needs related to wound healing as evidenced by estimated needs.  Being addressed as diet advanced, supplement  GOAL:   Patient will meet greater than or equal to 90% of their needs  Progressing  MONITOR:   PO intake, Supplement acceptance, Labs, Weight trends  REASON FOR ASSESSMENT:   Consult Enteral/tube feeding initiation and management  ASSESSMENT:   Patient with PMH significant for CAD, depression, DM, GERD, HLD, HTN, seizure disorder, and CKD III. Presents this admission with acute exacerbation of CHF.   1/19 Intubated 1/23 Extubated 1/24 Diet advanced to Dysphagia III, Thins per SLP  Pt hungry this AM, waiting on first meal tray.   Weight down with diuresis; current wt 99.7 kg; Admission wt 104.4 kg. Net negative 3 L since admission  Labs: CBGs 114-176, Creatinine 1.36, BUN 89 Meds: decadron, ss novolog, levemir  Diet Order:   Diet Order            DIET DYS 3 Room service appropriate? Yes; Fluid consistency: Thin  Diet effective now              EDUCATION NEEDS:   Not appropriate for education at this time  Skin:  Skin Assessment: Skin Integrity Issues: Skin Integrity Issues:: Stage III Stage II: abdomen Stage III: r. heel Other: MASD- abdomen, perineum/ Petechiae- left foot  Last BM:  1/18  Height:   Ht Readings from Last 1 Encounters:  01/22/18 4\' 11"  (1.499 m)    Weight:   Wt Readings from Last 1 Encounters:  01/27/18 99.7 kg    Ideal Body Weight:  44.3 kg  BMI:  Body mass index is 44.39 kg/m.  Estimated Nutritional Needs:   Kcal:  1800-2000 kcals   Protein:  100-125 g   Fluid:  >/= 1.5 L   Kerman Passey MS, RD, LDN, CNSC [phone] Pager  [phone] Weekend/On-Call Pager

## 2018-01-27 NOTE — Progress Notes (Signed)
Modified Barium Swallow Progress Note  Patient Details  Name: [name] MRN: [id number] Date of Birth: August 06, 1945  Today's Date: 01/27/2018  Modified Barium Swallow completed.  Full report located under Chart Review in the Imaging Section.  Brief recommendations include the following:  Clinical Impression  Pt's swallowing function is [facility] for age, with adequate strength and coordination of oropharyngeal structures. She initiated swallow of thin liquids at pyriform sinuses, which frequently led to penetration above vocal folds during the swallow. Pt exhibited great strength and coordination of laryngeal closure, which led to frequent ejection of penetrated material. Some trace penetration above vocal folds observed without ejection from airway, however this is [facility] for pt's age. Following evaluation, pt began to cough. Fluoroscopy turned on revealed no esophageal or oropharyngeal causations. Recommend advancement to Dys 3 diet and thin liquids. No SLP f/u warranted.   Swallow Evaluation Recommendations       SLP Diet Recommendations: Dysphagia 3 (Mech soft) solids;Thin liquid   Liquid Administration via: Cup;Straw   Medication Administration: Whole meds with liquid   Supervision: Patient able to self feed;Staff to assist with self feeding           Oral Care Recommendations: Oral care BID        Ellis Savage, SLP Student  01/27/2018,2:59 PM

## 2018-01-27 NOTE — Progress Notes (Signed)
Progress Note  Patient Name: [name] Date of Encounter: 01/27/2018  Primary Cardiologist: Rozann Lesches, MD    Subjective    73 year old female who receives her care at [facility] hospital.  She has a history of severe aortic stenosis and severe three-vessel coronary artery disease.  She was admitted with respiratory failure.  Had a heart catheterization in June 2019.  She has severe small vessel disease.  The notes from [facility] suggest that there are no good surgical targets. She was in the process of being evaluated for TAVR but needed to be optimized medically.  She remains on the vent but seems to be improving.  The critical care team's hope is that she can be extubated today. We still do not do not have any idea what her functional status is.  Notes  from Dr. Wyline Copas suggest that she is not a good candidate for coronary artery bypass grafting because of lack of sufficient targets.  And was to proceed with TAVR.  I  called Dr. Rhetta Mura office yesterday  to discuss with him but he is on vacation until Feb. 10  She has now been extubated.   More awake and alter. Up eating soft food with speech pathology .   We spoke today for the first time. I explained that we were getting her strong enough so that we can possible fix her valve.   She said that she does not want any heart operation / procedure.   Inpatient Medications    Scheduled Meds: . ALPRAZolam  0.5 mg Per Tube QHS  . aspirin  81 mg Per Tube Daily  . chlorhexidine gluconate (MEDLINE KIT)  15 mL Mouth Rinse BID  . Chlorhexidine Gluconate Cloth  6 each Topical Q0600  . collagenase   Topical Daily  . famotidine  20 mg Per Tube BID  . fentaNYL (SUBLIMAZE) injection  50 mcg Intravenous Once  . furosemide  80 mg Intravenous Once  . gabapentin  300 mg Per Tube QHS  . Gerhardt's butt cream   Topical TID  . insulin aspart  0-15 Units Subcutaneous Q4H  . insulin detemir  8 Units Subcutaneous QHS  . losartan  25 mg  Per Tube Daily  . mouth rinse  15 mL Mouth Rinse BID  . metoprolol tartrate  50 mg Per Tube Daily  . metoprolol tartrate  5 mg Intravenous Once  .  morphine injection  2 mg Intravenous Once  . multivitamin  15 mL Per Tube Daily  . mupirocin ointment  1 application Nasal BID  . rosuvastatin  20 mg Per Tube QPM  . sertraline  50 mg Per Tube QHS  . sodium chloride flush  3 mL Intravenous Q12H   Continuous Infusions: . sodium chloride 10 mL/hr at 01/27/18 0600  . feeding supplement (VITAL HIGH PROTEIN) Stopped (01/26/18 2000)  . fentaNYL infusion INTRAVENOUS Stopped (01/26/18 0233)  . heparin 1,000 Units/hr (01/27/18 0800)  . levETIRAcetam     PRN Meds: sodium chloride, acetaminophen, diphenhydrAMINE, docusate sodium, fentaNYL, ipratropium-albuterol, midazolam, morphine injection, ondansetron (ZOFRAN) IV, polyethylene glycol, sodium chloride flush   Vital Signs    Vitals:   01/27/18 0600 01/27/18 0700 01/27/18 0714 01/27/18 0800  BP: 127/70 (!) 145/81  139/66  Pulse:    69  Resp: _0 Temp:   97.9 F (36.6 C)   TempSrc:   Oral   SpO2:    100%  Weight:      Height:  Intake/Output Summary (Last 24 hours) at 01/27/2018 0933 Last data filed at 01/27/2018 0800 Gross per 24 hour  Intake 1205.64 ml  Output 1750 ml  Net -544.36 ml   Last 3 Weights 01/27/2018 01/26/2018 01/25/2018  Weight (lbs) 219 lb 12.8 oz 222 lb 14.2 oz 227 lb 11.8 oz  Weight (kg) 99.7 kg 101.1 kg 103.3 kg      Telemetry    NSR  - Personally Reviewed  ECG     NSR  - Personally Reviewed  Physical Exam     Physical Exam: Blood pressure 139/66, pulse 69, temperature 97.9 F (36.6 C), temperature source Oral, resp. rate 20, height _0  (1.499 m), weight 99.7 kg, SpO2 100 %.  GEN:  Chronically ill female,  Sleepy  HEENT: Normal, hoarse voice  NECK: No JVD; No carotid bruits LYMPHATICS: No lymphadenopathy CARDIAC: RRR   8-4/1 systolic murmur  RESPIRATORY:  Clear to auscultation without  rales, wheezing or rhonchi  ABDOMEN: Soft, non-tender, non-distended MUSCULOSKELETAL:  No edema; No deformity  SKIN: Warm and dry NEUROLOGIC:  Alert and oriented x 3    Labs    Chemistry Recent Labs  Lab 01/25/18 0440 01/26/18 0431 01/27/18 0526  NA 140 138 141  K 4.1 3.9 3.9  CL 104 101 101  CO2 _1 GLUCOSE 197* 325* 148*  BUN 81* 86* 89*  CREATININE 1.61* 1.40* 1.36*  CALCIUM 8.5* 8.3* 8.9  GFRNONAA 32* 37* 39*  GFRAA 37* 43* 45*  ANIONGAP _2 Hematology Recent Labs  Lab 01/25/18 0440 01/26/18 0431 01/27/18 0526  WBC 11.0* 8.5 7.8  RBC 2.66* 2.63* 2.77*  HGB 8.1* 7.9* 8.2*  HCT 26.5* 25.8* 27.4*  MCV 99.6 98.1 98.9  MCH 30.5 30.0 29.6  MCHC 30.6 30.6 29.9*  RDW 16.3* 16.3* 15.9*  PLT 199 184 216    Cardiac Enzymes Recent Labs  Lab 01/22/18 0736 01/22/18 1414 01/22/18 2109 01/23/18 0608  TROPONINI 2.66* 3.91* 10.71* 21.39*   No results for input(s): TROPIPOC in the last 168 hours.   BNP Recent Labs  Lab 01/22/18 0155  BNP 2,778.0*     DDimer No results for input(s): DDIMER in the last 168 hours.   Radiology    No results found.  Cardiac Studies     Patient Profile     73 y.o. female with a known history of severe aortic stenosis and history of diffuse small vessel three-vessel coronary artery disease. Notes  from Dr. Wyline Copas suggest that she is not a good candidate for coronary artery bypass grafting because of lack of sufficient targets.  And was to proceed with TAVR.  I have called Dr. Rhetta Mura office today to discuss with him but he is on vacation until Feb. [place]    1.  Respiratory failure:  Pt has been extubated. .   2.  Severe aortic stenosis:   She informed me today that she does not want any cardiac procedure. Will continue with medical therapy   3.  Coronary disease: Heart catheterization reported reveals severe diffuse small vessel disease.    Continue medical therapy       For questions  or updates, please contact [place] Please consult [email or web address] for contact info under        Signed, Mertie Moores, MD  01/27/2018, 9:33 AM

## 2018-01-28 DIAGNOSIS — Z515 Encounter for palliative care: Secondary | ICD-10-CM

## 2018-01-28 DIAGNOSIS — N183 Chronic kidney disease, stage 3 (moderate): Secondary | ICD-10-CM

## 2018-01-28 DIAGNOSIS — G40909 Epilepsy, unspecified, not intractable, without status epilepticus: Secondary | ICD-10-CM

## 2018-01-28 DIAGNOSIS — J9622 Acute and chronic respiratory failure with hypercapnia: Secondary | ICD-10-CM

## 2018-01-28 DIAGNOSIS — Z7189 Other specified counseling: Secondary | ICD-10-CM

## 2018-01-28 DIAGNOSIS — J9621 Acute and chronic respiratory failure with hypoxia: Secondary | ICD-10-CM

## 2018-01-28 LAB — GLUCOSE, CAPILLARY
GLUCOSE-CAPILLARY: 116 mg/dL — AB (ref 70–99)
GLUCOSE-CAPILLARY: 146 mg/dL — AB (ref 70–99)
Glucose-Capillary: 122 mg/dL — ABNORMAL HIGH (ref 70–99)
Glucose-Capillary: 140 mg/dL — ABNORMAL HIGH (ref 70–99)
Glucose-Capillary: 145 mg/dL — ABNORMAL HIGH (ref 70–99)
Glucose-Capillary: 152 mg/dL — ABNORMAL HIGH (ref 70–99)

## 2018-01-28 NOTE — Consult Note (Signed)
Consultation Note Date: 01/28/2018   Patient Name: [name]  DOB: [birth year]  MRN: [id number]  Age / Sex: 73 y.o., female  PCP: Briscoe Deutscher, DO Referring Physician: Mariel Aloe, MD  Reason for Consultation: Establishing goals of care and Psychosocial/spiritual support  HPI/Patient Profile: 73 y.o. female  with past medical history of three-vessel coronary artery disease, history of heart catheterization June 2019, seizure disorder, COPD on home O2, hypertension, chronic kidney disease stage III, depression, diabetes type 2, severe aortic stenosis admitted on 01/22/2018 with worsening shortness of breath.  Patient was found to have O2 sats at 78% on room air.  Her potassium upon admission was 5.4, troponin  0.63, BNP 2778.  She was placed on BiPAP in the emergency room, but subsequently required intubation from 01/22/2018 to 01/26/2018.  She is now out of ICU and on the medical floor, extubated.  Consult ordered for goals of care secondary to end-stage CHF, uncorrectable aortic stenosis.   Clinical Assessment and Goals of Care: Patient seen, chart reviewed.  Patient reports having a good night and feeling much better.  She does not remember much of coming to the hospital, nor does she remember being intubated.  She reports a large supportive family.  She is followed by cardiologist in [facility] Dr. Wyline Copas.  She verbalizes good understanding of her heart disease specifically her severe aortic stenosis.  She states she has discussed aortic repair, specifically TAVR procedure during this hospitalization as well as with her primary cardiologist.  She has consistently indicated that she does not want this procedure.  She verbalizes good understanding that her underlying comorbidities will worsen and that the nature of congestive heart failure chronic kidney disease and COPD are not only chronic but progressive.  She  describes a large support system including her husband of 75 years as well as her daughter, grandchildren.  Her main goal is to be in her home and be able to spend time with her family, specifically her grandchildren for as long as she can.  We discussed the difference between full scope of care as well as care focused on symptom management, comfort.  Also discussed and defined terms of full code versus DNR.  Endpoint for patient would be quality of life where she can no longer could live in her home but other than that she wishes to remain a full code.  She states if she were to become sicker during this hospitalization she would undergo intubation again as well as CPR, defibrillation  Patient is able to speak for herself at this point.  Her healthcare proxy where she unable to would be her husband, [name] at [place]   Full code by choice Home with home health Patient would benefit from ongoing goals of care.  Those services can be accessed in the community through [place] at [phone] or [facility] palliative [facility] division at [phone] Code Status/Advance Care Planning:  Full code   Palliative Prophylaxis:  Aspiration, Bowel Regimen, Delirium Protocol, Eye Care, Frequent Pain Assessment, Oral Care and Turn Reposition  Additional Recommendations (Limitations, Scope, Preferences):  Full Scope Treatment  Psycho-social/Spiritual:   Desire for further Chaplaincy support:no  Additional Recommendations: Referral to Community Resources   Prognosis:   Unable to determine  Discharge Planning: Home with Home Health      Primary Diagnoses: Present on Admission: . Acute on chronic combined systolic and diastolic CHF (congestive heart failure) ([place]) . Hypertension associated with diabetes ([place]) . NSTEMI (non-ST elevated myocardial infarction) ([place]) . Anemia of chronic disease . Anxiety .  CKD (chronic kidney disease), stage III ([place]) . Chronic respiratory failure with hypoxia ([place])   I have reviewed the medical record, interviewed the patient and family, and examined the patient. The following aspects are pertinent.  Past Medical History:  Diagnosis Date  . Anxiety   . Coronary artery disease   . Depression   . Diabetes mellitus    insulin dependent  . Edema of lower extremity   . Esophageal stricture 2008   EGD  . Fatigue   . GERD (gastroesophageal reflux disease)   . Heart murmur   . Hiatal hernia 2008   EGD  . Hyperlipidemia   . Hypertension   . Knee pain   . Morbid obesity ([place])   . Nonproductive cough    chronic  . Orthopnea   . Seizure disorder ([place])   . SOB (shortness of breath)   . Syncope and collapse   . Tremor    Social History   Socioeconomic History  . Marital status: Married    Spouse name: Not on file  . Number of children: 2  . Years of education: 63  . Highest education level: Not on file  Occupational History  . Occupation: Retired  Scientific laboratory technician  . Financial resource strain: Not on file  . Food insecurity:    Worry: Not on file    Inability: Not on file  . Transportation needs:    Medical: Not on file    Non-medical: Not on file  Tobacco Use  . Smoking status: Former Smoker    Last attempt to quit: 06/02/1970    Years since quitting: 47.6  . Smokeless tobacco: Never Used  Substance and Sexual Activity  . Alcohol use: No  . Drug use: No  . Sexual activity: Not on file  Lifestyle  . Physical activity:    Days per week: Not on file    Minutes per session: Not on file  . Stress: Not on file  Relationships  . Social connections:    Talks on phone: Not on file    Gets together: Not on file    Attends religious service: Not on file    Active member of club or organization: Not on file    Attends meetings of clubs or organizations: Not on file    Relationship status: Not on file  Other Topics Concern  . Not on file    Social History Narrative   Lives at home with her husband.   Left-handed.   No caffeine use.   Family History  Problem Relation Age of Onset  . Heart attack Mother   . Hypertension Mother   . COPD Father   . Heart disease Sister   . Diabetes Brother   . Diabetes Brother   . Prostate cancer Brother   . Kidney disease Brother    Scheduled Meds: . ALPRAZolam  0.5 mg Oral QHS  . aspirin  81 mg Oral Daily  . chlorhexidine gluconate (MEDLINE KIT)  15 mL Mouth Rinse BID  . collagenase   Topical Daily  . enoxaparin (LOVENOX) injection  40 mg Subcutaneous Q24H  . famotidine  20 mg Oral BID  . feeding supplement (ENSURE ENLIVE)  237 mL Oral BID BM  . gabapentin  300 mg Oral QHS  . Gerhardt's butt cream   Topical TID  . insulin aspart  0-15 Units Subcutaneous Q4H  . insulin detemir  8 Units Subcutaneous QHS  . levETIRAcetam  1,000 mg Oral BID  . losartan  25 mg Oral Daily  . mouth rinse  15 mL Mouth Rinse BID  . metoprolol tartrate  5 mg Intravenous Once  . metoprolol tartrate  50 mg Oral Once  .  morphine injection  2 mg Intravenous Once  . rosuvastatin  20 mg Oral QPM  . sertraline  50 mg Oral QHS  . sodium chloride flush  3 mL Intravenous Q12H  . torsemide  30 mg Oral Daily   Continuous Infusions: . sodium chloride Stopped (01/27/18 1647)   PRN Meds:.sodium chloride, acetaminophen, diphenhydrAMINE, docusate sodium, ipratropium-albuterol, ondansetron (ZOFRAN) IV, polyethylene glycol, sodium chloride flush Medications Prior to Admission:  Prior to Admission medications   Medication Sig Start Date End Date Taking? Authorizing Provider  acetaminophen (TYLENOL) 500 MG tablet Take 1,000 mg by mouth 2 (two) times daily. For leg pain   Yes [provider]  ALPRAZolam (XANAX) 0.5 MG tablet Take 1 tablet (0.5 mg total) by mouth at bedtime. 10/31/17  Yes Briscoe Deutscher, DO  aspirin 325 MG EC tablet Take 1 tablet (325 mg total) by mouth daily. 10/04/17  Yes Briscoe Deutscher, DO   clopidogrel (PLAVIX) 75 MG tablet TAKE ONE TABLET BY MOUTH DAILY Patient taking differently: Take 75 mg by mouth at bedtime.  11/08/17  Yes Briscoe Deutscher, DO  collagenase (SANTYL) ointment Apply 1 application topically daily. Patient taking differently: Apply 1 application topically daily. Apply to feet 10/14/17  Yes Briscoe Deutscher, DO  furosemide (LASIX) 20 MG tablet TAKE ONE TABLET BY MOUTH TWICE A DAY Patient taking differently: Take 20 mg by mouth daily.  12/14/17  Yes Briscoe Deutscher, DO  gabapentin (NEURONTIN) 300 MG capsule TAKE ONE CAPSULE BY MOUTH AT BEDTIME Patient taking differently: Take 300 mg by mouth at bedtime.  01/16/18  Yes Briscoe Deutscher, DO  insulin detemir (LEVEMIR) 100 unit/ml SOLN Inject 15 Units into the skin at bedtime.   Yes [provider]  isosorbide mononitrate (IMDUR) 60 MG 24 hr tablet Take 1 tablet (60 mg total) by mouth daily. 10/04/17  Yes Briscoe Deutscher, DO  levETIRAcetam (KEPPRA) 1000 MG tablet TAKE 1 TABLET BY MOUTH TWICE DAILY Patient taking differently: Take 1,000 mg by mouth 2 (two) times daily.  12/13/17  Yes Briscoe Deutscher, DO  losartan (COZAAR) 25 MG tablet TAKE ONE TABLET BY MOUTH DAILY Patient taking differently: Take 25 mg by mouth at bedtime.  11/14/17  Yes Briscoe Deutscher, DO  Melatonin 3 MG TABS Take 1 tablet (3 mg total) by mouth at bedtime. 08/26/17  Yes Briscoe Deutscher, DO  metoprolol tartrate (LOPRESSOR) 50 MG tablet TAKE 1 TABLET BY MOUTH ONCE DAILY Patient taking differently: Take 50 mg by mouth daily.  12/13/17  Yes Briscoe Deutscher, DO  ondansetron (ZOFRAN) 4 MG tablet Take 1 tablet (4 mg total) by mouth every 6 (six) hours as needed for nausea or vomiting. 08/26/17  Yes Briscoe Deutscher, DO  oxymetazoline (AFRIN) 0.05 % nasal  spray Place 1 spray into both nostrils 2 (two) times daily as needed (nosebleeds). 08/26/17  Yes Briscoe Deutscher, DO  potassium chloride (K-DUR) 10 MEQ tablet TAKE ONE TABLET BY MOUTH DAILY Patient taking differently:  Take 10 mEq by mouth daily.  12/14/17  Yes Briscoe Deutscher, DO  rizatriptan (MAXALT) 10 MG tablet TAKE 1 TABLET BY MOUTH DAILY AS NEEDED FOR CHRONIC  PAIN Patient taking differently: Take 10 mg by mouth daily as needed for migraine.  12/17/17  Yes Briscoe Deutscher, DO  rosuvastatin (CRESTOR) 20 MG tablet Take 1 tablet (20 mg total) by mouth every evening. Patient taking differently: Take 20 mg by mouth at bedtime.  10/04/17  Yes Briscoe Deutscher, DO  sertraline (ZOLOFT) 50 MG tablet Take 1 tablet (50 mg total) by mouth at bedtime. 10/31/17  Yes Briscoe Deutscher, DO  sodium chloride (OCEAN) 0.65 % SOLN nasal spray Place 2 sprays into both nostrils 3 (three) times daily. Patient taking differently: Place 2 sprays into both nostrils 3 (three) times daily as needed for congestion.  08/26/17  Yes Briscoe Deutscher, DO  traMADol (ULTRAM) 50 MG tablet Take 1 tablet (50 mg total) by mouth every 6 (six) hours as needed. Patient taking differently: Take 50 mg by mouth every 6 (six) hours as needed (pain).  12/16/17  Yes Briscoe Deutscher, DO  acetaminophen (TYLENOL) 325 MG tablet Take 2 tablets (650 mg total) by mouth every 4 (four) hours as needed for headache or mild pain. Patient not taking: Reported on 01/22/2018 09/12/16   Robbie Lis, MD  albuterol (PROVENTIL HFA;VENTOLIN HFA) [phone] Base) MCG/ACT inhaler Inhale 2 puffs into the lungs every 6 (six) hours as needed for wheezing or shortness of breath. Patient not taking: Reported on 01/22/2018 10/31/17   Briscoe Deutscher, DO  glucose blood (ONETOUCH VERIO) test strip 1 each by Other route daily. And lancets 1/day. Dx code E11.9. 08/26/17   Briscoe Deutscher, DO  insulin aspart (NOVOLOG) 100 UNIT/ML injection Inject 0-9 Units into the skin 3 (three) times daily with meals. Sliding scale CBG 70 - 120: 0 units CBG 121 - 150: 1 unit,  CBG 151 - 200: 2 units,  CBG 201 - 250: 3 units,  CBG 251 - 300: 5 units,  CBG 301 - 350: 7 units,  CBG 351 - 400: 9 units   CBG > 400: 9 units and  notify your MD Patient not taking: Reported on 01/22/2018 08/26/17   Briscoe Deutscher, DO  insulin detemir (LEVEMIR) 100 UNIT/ML injection Inject 0.1 mLs (10 Units total) into the skin daily. Patient not taking: Reported on 01/22/2018 08/26/17   Briscoe Deutscher, DO  loratadine (CLARITIN) 10 MG tablet Take 1 tablet (10 mg total) by mouth daily as needed for allergies. Patient not taking: Reported on 01/22/2018 08/26/17   Briscoe Deutscher, DO  polyethylene glycol [facility] / GLYCOLAX) packet Take 17 g by mouth daily as needed for mild constipation. Patient not taking: Reported on 01/22/2018 08/26/17   Briscoe Deutscher, DO  potassium chloride (K-DUR,KLOR-CON) 10 MEQ tablet Take 1 tablet (10 mEq total) by mouth daily. Patient not taking: Reported on 01/22/2018 10/04/17   Briscoe Deutscher, DO  SANTYL ointment APPLY ONE APPLICATION TOPICALLY DAILY Patient not taking: Reported on 01/22/2018 11/08/17   Briscoe Deutscher, DO   Allergies  Allergen Reactions  . Codeine Itching   Review of Systems  Unable to perform ROS: Other    Physical Exam Vitals signs and nursing note reviewed.  Constitutional:  Appearance: She is well-developed. She is ill-appearing.  HENT:     Head: Normocephalic and atraumatic.     Comments: Facial bruising secondary to a fall Neck:     Musculoskeletal: Normal range of motion.  Cardiovascular:     Rate and Rhythm: Normal rate.  Pulmonary:     Comments: Mild increased work of breathing at rest.  Subjectively she denies feeling short of breath Skin:    General: Skin is warm and dry.     Coloration: Skin is pale.     Findings: Bruising present.  Neurological:     General: No focal deficit present.     Mental Status: She is alert and oriented to person, place, and time.  Psychiatric:        Mood and Affect: Mood normal.        Behavior: Behavior normal.     Vital Signs: BP (!) 150/71 (BP Location: Left Arm)   Pulse 72   Temp 97.6 F (36.4 C) (Oral)   Resp 16   Ht '4\' 11"'  (1.499  m)   Wt 101.7 kg   SpO2 100%   BMI 45.26 kg/m  Pain Scale: 0-10   Pain Score: 0-No pain   SpO2: SpO2: 100 % O2 Device:SpO2: 100 % O2 Flow Rate: .O2 Flow Rate (L/min): 4 L/min  IO: Intake/output summary:   Intake/Output Summary (Last 24 hours) at 01/28/2018 1058 Last data filed at 01/28/2018 0900 Gross per 24 hour  Intake 1663 ml  Output 1900 ml  Net -237 ml    LBM: Last BM Date: 01/27/18 Baseline Weight: Weight: 100 kg Most recent weight: Weight: 101.7 kg     Palliative Assessment/Data:   Flowsheet Rows     Most Recent Value  Intake Tab  Referral Department  Hospitalist  Unit at Time of Referral  Med/Surg Unit  Palliative Care Primary Diagnosis  Cardiac  Date Notified  01/27/18  Reason for referral  Clarify Goals of Care, Psychosocial or Spiritual support  Date of Admission  01/22/18  Date first seen by Palliative Care  01/28/18  # of days Palliative referral response time  1 Day(s)  # of days IP prior to Palliative referral  5  Clinical Assessment  Palliative Performance Scale Score  40%  Pain Max last 24 hours  Not able to report  Pain Min Last 24 hours  Not able to report  Dyspnea Max Last 24 Hours  Not able to report  Dyspnea Min Last 24 hours  Not able to report  Nausea Max Last 24 Hours  Not able to report  Nausea Min Last 24 Hours  Not able to report  Anxiety Max Last 24 Hours  Not able to report  Anxiety Min Last 24 Hours  Not able to report  Other Max Last 24 Hours  Not able to report  Psychosocial & Spiritual Assessment  Palliative Care Outcomes  Patient/Family meeting held?  Yes  Who was at the meeting?  pt  Palliative Care Outcomes  Provided psychosocial or spiritual support      Time In: 0900 Time Out: 1000 Time Total: 60 min Greater than 50%  of this time was spent counseling and coordinating care related to the above assessment and plan.  Signed by: Dory Horn, NP   Please contact Palliative Medicine Team phone at [phone] for  questions and concerns.  For individual provider: See Shea Evans

## 2018-01-28 NOTE — Progress Notes (Signed)
Patient resting comfortably during shift report. Denies complaints.  

## 2018-01-28 NOTE — Clinical Social Work Note (Signed)
Clinical Social Work Assessment  Patient Details  Name: [name] MRN: [id number] Date of Birth: 1945/01/10  Date of referral:  01/28/18               Reason for consult:  Facility Placement                Permission sought to share information with:  Facility Sport and exercise psychologist Permission granted to share information::  Yes, Verbal Permission Granted  Name::     [name]  Agency::     Relationship::  Daughter  Contact Information:  [phone]  Housing/Transportation Living arrangements for the past 2 months:  [place] of Information:  Patient Patient Interpreter Needed:  None Criminal Activity/Legal Involvement Pertinent to Current Situation/Hospitalization:  No - Comment as needed Significant Relationships:  Adult Children Lives with:  Self Do you feel safe going back to the place where you live?  Yes Need for family participation in patient care:  No (Coment)  Care giving concerns:  CSW received consult for possible SNF placement at time of discharge. CSW spoke with patient regarding PT recommendation of SNF placement at time of discharge. Patient reported that she will be going to stay with her daughter at discharge. She provided CSW with permission to contact her daughter to confirm plan. CSW to continue to follow and assist with discharge planning needs.   Social Worker assessment / plan:  CSW spoke with patient concerning possibility of rehab at [facility] before returning home.  Employment status:  Retired Forensic scientist:  Medicare PT Recommendations:  [place] / Referral to community resources:  [place]  Patient/Family's Response to care:  Patient reports that she would like to return home with her daughter at discharge. She has been to SNF before and did not like it. She reports that she feels safe with her daughter and others in the home who will help out. CSW spoke with patient's daughter. She states  that patient will be returning to patient's home with her spouse and a private nurse (not daughter's home because she has to work). They are agreeable to home health through [facility] since they already use them for patient's oxygen. Patient already has all equipment, including a hospital bed.  Patient/Family's Understanding of and Emotional Response to Diagnosis, Current Treatment, and Prognosis:  Patient/family is realistic regarding therapy needs and expressed being hopeful for return home. She stated that she is feeling better. Patient expressed understanding of CSW role and discharge process as well as medical condition. No questions/concerns about plan or treatment.    Emotional Assessment Appearance:  Appears stated age Attitude/Demeanor/Rapport:  Engaged, Gracious Affect (typically observed):  Accepting, Appropriate Orientation:  Oriented to Self, Oriented to Place, Oriented to  Time, Oriented to Situation Alcohol / Substance use:  Not Applicable Psych involvement (Current and /or in the community):  No (Comment)  Discharge Needs  Concerns to be addressed:  Care Coordination Readmission within the last 30 days:  No Current discharge risk:  Dependent with Mobility Barriers to Discharge:  Continued Medical Work up  [place] signing off as no further needs present.    Lissa Morales Mekiyah Gladwell, LCSW 01/28/2018, 10:01 AM

## 2018-01-28 NOTE — Progress Notes (Signed)
    Called to see the patient and her husband in regards to questions about her current care plan. By review of notes, she has refused further testing. Upon introducing myself as a Cardiology PA, the patient's husband voiced he respects his wife's wishes in that she does not wish to undergo further testing. His main concern at this time is if he will be able to care for her at the time of discharge. PT recommended SNF but the patient has declined. He is interested in having a Home Health RN as well as being informed about possible [facility] so she could have more frequent care.   Case Management has been consulted. Made patient's nurse aware.   Signed, Erma Heritage, PA-C 01/28/2018, 12:50 PM Pager: [phone]

## 2018-01-28 NOTE — Progress Notes (Signed)
Progress Note  Patient Name: [name] Date of Encounter: 01/28/2018  Primary Cardiologist: Rozann Lesches, MD    Subjective    73 year old female who receives her care at [facility] hospital.  She has a history of severe aortic stenosis and severe three-vessel coronary artery disease.  She was admitted with respiratory failure.  Had a heart catheterization in June 2019.  She has severe small vessel disease.  The notes from [facility] suggest that there are no good surgical targets. She was in the process of being evaluated for TAVR but needed to be optimized medically.  Currently no complaints eating breakfast Good night sleep  Inpatient Medications    Scheduled Meds: . ALPRAZolam  0.5 mg Oral QHS  . aspirin  81 mg Oral Daily  . chlorhexidine gluconate (MEDLINE KIT)  15 mL Mouth Rinse BID  . collagenase   Topical Daily  . enoxaparin (LOVENOX) injection  40 mg Subcutaneous Q24H  . famotidine  20 mg Oral BID  . feeding supplement (ENSURE ENLIVE)  237 mL Oral BID BM  . gabapentin  300 mg Oral QHS  . Gerhardt's butt cream   Topical TID  . insulin aspart  0-15 Units Subcutaneous Q4H  . insulin detemir  8 Units Subcutaneous QHS  . levETIRAcetam  1,000 mg Oral BID  . losartan  25 mg Oral Daily  . mouth rinse  15 mL Mouth Rinse BID  . metoprolol tartrate  5 mg Intravenous Once  . metoprolol tartrate  50 mg Oral Once  .  morphine injection  2 mg Intravenous Once  . rosuvastatin  20 mg Oral QPM  . sertraline  50 mg Oral QHS  . sodium chloride flush  3 mL Intravenous Q12H  . torsemide  30 mg Oral Daily   Continuous Infusions: . sodium chloride Stopped (01/27/18 1647)   PRN Meds: sodium chloride, acetaminophen, diphenhydrAMINE, docusate sodium, ipratropium-albuterol, ondansetron (ZOFRAN) IV, polyethylene glycol, sodium chloride flush   Vital Signs    Vitals:   01/27/18 1956 01/28/18 0502 01/28/18 0504 01/28/18 0815  BP: 139/72  130/63 (!) 150/71  Pulse: 63  64 72    Resp: _0 Temp: 97.7 F (36.5 C)  97.7 F (36.5 C) 97.6 F (36.4 C)  TempSrc: Oral  Oral Oral  SpO2: 100%  99% 100%  Weight:  101.7 kg    Height:        Intake/Output Summary (Last 24 hours) at 01/28/2018 3559 Last data filed at 01/28/2018 0800 Gross per 24 hour  Intake 1442.99 ml  Output 1700 ml  Net -257.01 ml   Last 3 Weights 01/28/2018 01/27/2018 01/26/2018  Weight (lbs) 224 lb 1.6 oz 219 lb 12.8 oz 222 lb 14.2 oz  Weight (kg) 101.651 kg 99.7 kg 101.1 kg      Telemetry    NSR  - Personally Reviewed  ECG     NSR  - Personally Reviewed  Physical Exam   GEN: Elderly female, she appears to be older than her stated age. Neck: No JVD Cardiac: RRR, AS murmur consistent with aortic stenosis. Respiratory:  Vent. GI: Soft, nontender, non-distended  MS: No edema; No deformity. Neuro:  Nonfocal  Psych: Normal affect   Labs    Chemistry Recent Labs  Lab 01/25/18 0440 01/26/18 0431 01/27/18 0526  NA 140 138 141  K 4.1 3.9 3.9  CL 104 101 101  CO2 _1 GLUCOSE 197* 325* 148*  BUN 81* 86* 89*  CREATININE 1.61* 1.40* 1.36*  CALCIUM 8.5* 8.3* 8.9  GFRNONAA 32* 37* 39*  GFRAA 37* 43* 45*  ANIONGAP _0 Hematology Recent Labs  Lab 01/25/18 0440 01/26/18 0431 01/27/18 0526  WBC 11.0* 8.5 7.8  RBC 2.66* 2.63* 2.77*  HGB 8.1* 7.9* 8.2*  HCT 26.5* 25.8* 27.4*  MCV 99.6 98.1 98.9  MCH 30.5 30.0 29.6  MCHC 30.6 30.6 29.9*  RDW 16.3* 16.3* 15.9*  PLT 199 184 216    Cardiac Enzymes Recent Labs  Lab 01/22/18 0736 01/22/18 1414 01/22/18 2109 01/23/18 0608  TROPONINI 2.66* 3.91* 10.71* 21.39*   No results for input(s): TROPIPOC in the last 168 hours.   BNP Recent Labs  Lab 01/22/18 0155  BNP 2,778.0*     DDimer No results for input(s): DDIMER in the last 168 hours.   Radiology    No results found.  Cardiac Studies     Patient Profile     73 y.o. female with a known history of severe aortic stenosis and history of  diffuse small vessel three-vessel coronary artery disease. Notes  from Dr. Wyline Copas suggest that she is not a good candidate for coronary artery bypass grafting because of lack of sufficient targets.  And was to proceed with TAVR.  I have called Dr. Rhetta Mura office today to discuss with him but he is on vacation until Feb. [place]    1.  Respiratory failure: resolved extubated consider post extubation CXR  2.  Severe aortic stenosis: The patient is thought to have low output low gradient severe aortic stenosis. An aortic valve gradient was 31 by last echo.  Left ventricular ejection fraction is 35%. She clearly indicates again that she is not interested in further procedures and specifically not interested In TAVR. I told her she had a high likely hood of recurrent CHF without it.   3.  Coronary disease: Heart catheterization reported reveals severe diffuse small vessel disease.  She had a non-ST segment elevation myocardial infarction.  Continue medical RX asa plavix and nitrates     Will sign off Patient will f/u with Dr Wyline Copas in [facility] Patient does not wish for further procedures  For questions or updates, please contact [facility] Please consult [email or web address] for contact info under        Signed, Jenkins Rouge, MD  01/28/2018, 8:22 AM

## 2018-01-28 NOTE — Progress Notes (Signed)
PROGRESS NOTE    [name]  UGQ:[id number] DOB: [birth year] DOA: 01/22/2018 PCP: Briscoe Deutscher, DO   Brief Narrative: [name] is a 73 y.o. female with a history of CAD, seizures, diabetes, chronic respiratory failure and aortic stenosis. Patient presented secondary to hypoxic respiratory failure requiring mechanical ventilation.   Assessment & Plan:   Principal Problem:   Acute on chronic combined systolic and diastolic CHF (congestive heart failure) (HCC) Active Problems:   Hypertension associated with diabetes ([place])   Seizure disorder (HCC)   Coronary artery disease with history of myocardial infarction without history of CABG   Anemia of chronic disease   CKD (chronic kidney disease), stage III (HCC)   Anxiety   NSTEMI (non-ST elevated myocardial infarction) ([place])   Insulin-requiring or dependent type II diabetes mellitus (HCC)   Chronic respiratory failure with hypoxia (HCC)   Pressure injury of skin   Acute respiratory failure with hypoxia (HCC)   Goals of care, counseling/discussion   Palliative care by specialist   Acute on chronic respiratory failure with hypoxia and hypercapnia Secondary to acute pulmonary which was secondary to acute heart failure. Patient intubated from 1/19 to 1/23. Currently on chronic 4 L oxygen.  Aortic stenosis Recommendation for TAVR, however patient is declining. Palliative care consulted and patient requests full code. -Continue losartan  Elevated troponin Cardiology consulted. Not consistent with ACS and likely secondary to demand ischemia. Initially on heparin drip which was discontinued.  AKI on CKD stage III Baseline creatinine of about 1.1. Up to 1.75. unknown etiology. Improving with diuresis.  Macrocytic anemia Chronic. Stable.  Diabetes mellitus -Continue Levemir 8 units BID and SSI moderate  Seizure disorder -Continue Keppra  Acute on chronic combined systolic and diastolic heart failure Patient required  diuresis with IV lasix. Transitioned to torsemide.   Pressure injury Right heel , present on arrival   DVT prophylaxis: Lovenox Code Status:   Code Status: Full Code Family Communication: Husband at bedside Disposition Plan: Discharge home likely in 24 hours   Consultants:   Critical care medicine  Palliative care medicine  Procedures:   ETT (1/19 >> 1/23)  Transthoracic Echocardiogram (01/23/2018) Study Conclusions  - Left ventricle: The cavity size was normal. Wall thickness was   increased in a pattern of mild LVH. The estimated ejection   fraction was 35%. Diffuse hypokinesis. Features are consistent   with a pseudonormal left ventricular filling pattern, with   concomitant abnormal relaxation and increased filling pressure   (grade 2 diastolic dysfunction). - Aortic valve: Trileaflet; severely calcified leaflets. There was   moderate to severe stenosis. There was mild regurgitation. Mean   gradient (S): 31 mm Hg. Valve area (VTI): 0.98 cm^2. - Mitral valve: Severely fibrotic annulus. Mildly calcified   leaflets . There was no evidence for stenosis. There was mild   regurgitation. - Left atrium: The atrium was mildly dilated. - Right ventricle: The cavity size was mildly dilated. Systolic   function was moderately reduced. - Right atrium: The atrium was mildly dilated. - Tricuspid valve: There was moderate regurgitation. Peak RV-RA   gradient (S): 45 mm Hg. - Pulmonary arteries: PA peak pressure: 60 mm Hg (S). - Systemic veins: IVC measured 2.5 cm with < 50% respirophasic   variation, suggesting RA pressure 15 mmHg.  Impressions:  - Normal LV size with mild LV hypertrophy. EF 35%, diffuse   hypokinesis. Moderate diastolic dysfunction. Mildly dilated RV   with moderately decreased systolic function. There was   moderate-severe aortic stenosis (mean  gradient 31 mmHg, AVA 0.98   cm^2). Moderate pulmonary hypertenion. Dilated IVC with evidence   for elevated RV  filling pressure.  Antimicrobials:  None    Subjective: No dyspnea or chest pain. Declining SNF  Objective: Vitals:   01/28/18 0504 01/28/18 0815 01/28/18 1100 01/28/18 1238  BP: 130/63 (!) 150/71 (!) 130/55 123/66  Pulse: 64 72 67 (!) 54  Resp: '20 16  18  ' Temp: 97.7 F (36.5 C) 97.6 F (36.4 C)  98 F (36.7 C)  TempSrc: Oral Oral  Oral  SpO2: 99% 100%  100%  Weight:      Height:        Intake/Output Summary (Last 24 hours) at 01/28/2018 1616 Last data filed at 01/28/2018 0900 Gross per 24 hour  Intake 1053 ml  Output 1600 ml  Net -547 ml   Filed Weights   01/26/18 0500 01/27/18 0240 01/28/18 0502  Weight: 101.1 kg 99.7 kg 101.7 kg    Examination:  General exam: Appears calm and comfortable Respiratory system: Rales on auscultation. Respiratory effort normal. Cardiovascular system: S1 & S2 heard, RRR. 2/6 systolic murmur. Gastrointestinal system: Abdomen is nondistended, soft and nontender. No organomegaly or masses felt. Normal bowel sounds heard. Central nervous system: Alert and oriented. No focal neurological deficits. Extremities: No calf tenderness Skin: No cyanosis. No rashes Psychiatry: Judgement and insight appear normal. Mood & affect appropriate.     Data Reviewed: I have personally reviewed following labs and imaging studies  CBC: Recent Labs  Lab 01/22/18 0155 01/23/18 0608 01/24/18 0414 01/25/18 0440 01/26/18 0431 01/27/18 0526  WBC 9.5 8.0 11.3* 11.0* 8.5 7.8  NEUTROABS 6.3  --   --   --   --   --   HGB 9.4* 8.0* 8.0* 8.1* 7.9* 8.2*  HCT 32.7* 27.2* 26.5* 26.5* 25.8* 27.4*  MCV 101.2* 104.2* 101.1* 99.6 98.1 98.9  PLT 298 241 228 199 184 102   Basic Metabolic Panel: Recent Labs  Lab 01/22/18 0736 01/22/18 2109  01/23/18 0608 01/23/18 1536 01/24/18 0414 01/25/18 0440 01/26/18 0431 01/27/18 0526  NA  --   --    < > 140  --  140 140 138 141  K  --   --    < > 5.5*  --  5.0 4.1 3.9 3.9  CL  --   --    < > 109  --  109 104 101  101  CO2  --   --    < > 21*  --  '24 26 28 28  ' GLUCOSE  --   --    < > 176*  --  178* 197* 325* 148*  BUN  --   --    < > 60*  --  73* 81* 86* 89*  CREATININE  --   --    < > 1.74*  --  1.75* 1.61* 1.40* 1.36*  CALCIUM  --   --    < > 8.6*  --  8.5* 8.5* 8.3* 8.9  MG 2.1 2.1  --  2.0 2.1  --   --   --   --   PHOS 5.2* 4.6  --  5.0* 4.2  --   --   --   --    < > = values in this interval not displayed.   GFR: Estimated Creatinine Clearance: 39.3 mL/min (A) (by C-G formula based on SCr of 1.36 mg/dL (H)). Liver Function Tests: No results for input(s): AST, ALT, ALKPHOS, BILITOT,  PROT, ALBUMIN in the last 168 hours. No results for input(s): LIPASE, AMYLASE in the last 168 hours. No results for input(s): AMMONIA in the last 168 hours. Coagulation Profile: Recent Labs  Lab 01/22/18 2109  INR 1.35   Cardiac Enzymes: Recent Labs  Lab 01/22/18 0155 01/22/18 0736 01/22/18 1414 01/22/18 2109 01/23/18 0608  TROPONINI 2.63* 2.66* 3.91* 10.71* 21.39*   BNP (last 3 results) No results for input(s): PROBNP in the last 8760 hours. HbA1C: No results for input(s): HGBA1C in the last 72 hours. CBG: Recent Labs  Lab 01/27/18 2029 01/28/18 0027 01/28/18 0406 01/28/18 0800 01/28/18 1235  GLUCAP 149* 122* 116* 145* 152*   Lipid Profile: No results for input(s): CHOL, HDL, LDLCALC, TRIG, CHOLHDL, LDLDIRECT in the last 72 hours. Thyroid Function Tests: No results for input(s): TSH, T4TOTAL, FREET4, T3FREE, THYROIDAB in the last 72 hours. Anemia Panel: No results for input(s): VITAMINB12, FOLATE, FERRITIN, TIBC, IRON, RETICCTPCT in the last 72 hours. Sepsis Labs: No results for input(s): PROCALCITON, LATICACIDVEN in the last 168 hours.  Recent Results (from the past 240 hour(s))  MRSA PCR Screening     Status: Abnormal   Collection Time: 01/22/18 11:20 AM  Result Value Ref Range Status   MRSA by PCR POSITIVE (A) NEGATIVE Final    Comment:        The GeneXpert MRSA Assay  (FDA approved for NASAL specimens only), is one component of a comprehensive MRSA colonization surveillance program. It is not intended to diagnose MRSA infection nor to guide or monitor treatment for MRSA infections. RESULT CALLED TO, READ BACK BY AND VERIFIED WITH: RN Rosalie Gums [id number] Performed at [facility], [address], [place], [place] [address]          Radiology Studies: No results found.      Scheduled Meds: . ALPRAZolam  0.5 mg Oral QHS  . aspirin  81 mg Oral Daily  . chlorhexidine gluconate (MEDLINE KIT)  15 mL Mouth Rinse BID  . collagenase   Topical Daily  . enoxaparin (LOVENOX) injection  40 mg Subcutaneous Q24H  . famotidine  20 mg Oral BID  . feeding supplement (ENSURE ENLIVE)  237 mL Oral BID BM  . gabapentin  300 mg Oral QHS  . Gerhardt's butt cream   Topical TID  . insulin aspart  0-15 Units Subcutaneous Q4H  . insulin detemir  8 Units Subcutaneous QHS  . levETIRAcetam  1,000 mg Oral BID  . losartan  25 mg Oral Daily  . mouth rinse  15 mL Mouth Rinse BID  . metoprolol tartrate  5 mg Intravenous Once  .  morphine injection  2 mg Intravenous Once  . rosuvastatin  20 mg Oral QPM  . sertraline  50 mg Oral QHS  . sodium chloride flush  3 mL Intravenous Q12H  . torsemide  30 mg Oral Daily   Continuous Infusions: . sodium chloride Stopped (01/27/18 1647)     LOS: 6 days     Cordelia Poche, MD [facility] 01/28/2018, 4:16 PM  If 7PM-7AM, please contact night-coverage [email or web address]

## 2018-01-28 NOTE — Progress Notes (Signed)
Page to Cards PA per pt request (per Dr Teryl Lucy) pt and husband have some questions regarding cardiology care-plan.

## 2018-01-29 LAB — GLUCOSE, CAPILLARY
Glucose-Capillary: 122 mg/dL — ABNORMAL HIGH (ref 70–99)
Glucose-Capillary: 123 mg/dL — ABNORMAL HIGH (ref 70–99)
Glucose-Capillary: 124 mg/dL — ABNORMAL HIGH (ref 70–99)
Glucose-Capillary: 129 mg/dL — ABNORMAL HIGH (ref 70–99)
Glucose-Capillary: 175 mg/dL — ABNORMAL HIGH (ref 70–99)

## 2018-01-29 MED ORDER — ASPIRIN 81 MG PO CHEW: 81.0000 mg | CHEWABLE_TABLET | Freq: Every day | ORAL | 0 refills | Status: AC

## 2018-01-29 MED ORDER — TORSEMIDE 10 MG PO TABS: 30.0000 mg | ORAL_TABLET | Freq: Every day | ORAL | 0 refills | Status: AC

## 2018-01-29 MED ORDER — ENSURE ENLIVE PO LIQD: 237.0000 mL | Freq: Two times a day (BID) | ORAL | Status: AC

## 2018-01-29 NOTE — Discharge Summary (Signed)
Physician Discharge Summary  [name] NKN:[id number] DOB: 02/15/1945 DOA: 01/22/2018  PCP: Briscoe Deutscher, DO  Admit date: 01/22/2018 Discharge date: 01/29/2018  Admitted From: Home Disposition: Home  Recommendations for Outpatient Follow-up:  1. Follow up with PCP in 1 week 2. Follow up with Cardiology 3. Please obtain BMP/CBC in one week 4. Please follow up on the following pending results: None  Home Health: PT, OT, RN, Aide Equipment/Devices: None  Discharge Condition: Stable CODE STATUS: Full code Diet recommendation: Heart healthy   Brief/Interim Summary:  Admission HPI written by Vianne Bulls, MD   Chief Complaint: SOB   HPI: [name] is a 73 y.o. female with medical history significant for coronary artery disease, insulin-dependent diabetes mellitus, depression with anxiety, chronic kidney disease stage III, chronic anemia, hypertension, chronic 2 L/min supplemental oxygen requirement, and seizure disorder, now presenting to the emergency department with respiratory distress.  Patient reportedly developed dyspnea over the past day and worsened acutely 1 to 2 hours prior to arrival in the ED.  She gives a limited history due to her acute respiratory distress.  She denies any chest pain during this illness.  Has not been coughing much and denies any fevers or chills.  She reported adherence with her medications and denies dietary indiscretion.  ED Course: Upon arrival to the ED, patient is found to be afebrile, saturating 78% on room air, tachypneic, slightly tachycardic, and with stable blood pressure.  EKG features sinus tachycardia with rate 105 and repolarization abnormality.  Chest x-ray is notable for shallow inspiration with bibasilar atelectasis and pneumonia not excluded.  Chemistry panel is notable for glucose of 208, potassium 5.4, bicarbonate 19, and creatinine 1.10, consistent with her apparent baseline.  CBC is notable for stable anemia with  hemoglobin 9.4.  Troponin is elevated to 2.63 and BNP is elevated to 2778.  Patient was given 324 mg of aspirin and 40 mg IV Lasix in the ED.  She was placed on BiPAP in the ED.  IV heparin infusion was ordered and cardiology was consulted by the ED physician.  Cardiology recommends a medical admission for further evaluation and management.    Hospital course:  Acute on chronic respiratory failure with hypoxia and hypercapnia Secondary to acute pulmonary which was secondary to acute heart failure. Patient intubated from 1/19 to 1/23. Currently on chronic 4 L oxygen which is her baseline.  Aortic stenosis Recommendation for TAVR, however patient is declining. Palliative care consulted and patient requests full code. Continue losartan. Cardiology follow-up.  Elevated troponin Cardiology consulted. Not consistent with ACS and likely secondary to demand ischemia. Initially on heparin drip which was discontinued.  AKI on CKD stage III Baseline creatinine of about 1.1. Up to 1.75. unknown etiology. Improved with diuresis. Repeat BMP as an outpatient.  Macrocytic anemia Chronic. Stable.  Diabetes mellitus Continue Levemir  Seizure disorder Continue Keppra  Acute on chronic combined systolic and diastolic heart failure Patient required diuresis with IV lasix. Transitioned to torsemide.   Pressure injury Right heel , present on arrival  Discharge Diagnoses:  Principal Problem:   Acute on chronic combined systolic and diastolic CHF (congestive heart failure) (HCC) Active Problems:   Hypertension associated with diabetes ([place])   Seizure disorder ([place])   Coronary artery disease with history of myocardial infarction without history of CABG   Anemia of chronic disease   CKD (chronic kidney disease), stage III (HCC)   Anxiety   NSTEMI (non-ST elevated myocardial infarction) ([place])   Insulin-requiring  or dependent type II diabetes mellitus (HCC)   Chronic respiratory failure with  hypoxia (HCC)   Pressure injury of skin   Acute respiratory failure with hypoxia (HCC)   Goals of care, counseling/discussion   Palliative care by specialist    Discharge Instructions  Discharge Instructions    Call MD for:  difficulty breathing, headache or visual disturbances   Complete by:  As directed    Call MD for:  persistant dizziness or light-headedness   Complete by:  As directed    Diet - low sodium heart healthy   Complete by:  As directed    Increase activity slowly   Complete by:  As directed      Allergies as of 01/29/2018      Reactions   Codeine Itching      Medication List    STOP taking these medications   aspirin 325 MG EC tablet Replaced by:  aspirin 81 MG chewable tablet   furosemide 20 MG tablet Commonly known as:  LASIX   insulin aspart 100 UNIT/ML injection Commonly known as:  novoLOG   loratadine 10 MG tablet Commonly known as:  CLARITIN   potassium chloride 10 MEQ tablet Commonly known as:  K-DUR,KLOR-CON     TAKE these medications   acetaminophen 500 MG tablet Commonly known as:  TYLENOL Take 1,000 mg by mouth 2 (two) times daily. For leg pain What changed:  Another medication with the same name was removed. Continue taking this medication, and follow the directions you see here.   albuterol 108 (90 Base) MCG/ACT inhaler Commonly known as:  PROVENTIL HFA;VENTOLIN HFA Inhale 2 puffs into the lungs every 6 (six) hours as needed for wheezing or shortness of breath.   ALPRAZolam 0.5 MG tablet Commonly known as:  XANAX Take 1 tablet (0.5 mg total) by mouth at bedtime.   aspirin 81 MG chewable tablet Chew 1 tablet (81 mg total) by mouth daily. Start taking on:  January 30, 2018 Replaces:  aspirin 325 MG EC tablet   clopidogrel 75 MG tablet Commonly known as:  PLAVIX TAKE ONE TABLET BY MOUTH DAILY What changed:  when to take this   collagenase ointment Commonly known as:  SANTYL Apply 1 application topically daily. What  changed:    additional instructions  Another medication with the same name was removed. Continue taking this medication, and follow the directions you see here.   feeding supplement (ENSURE ENLIVE) Liqd Take 237 mLs by mouth 2 (two) times daily between meals.   gabapentin 300 MG capsule Commonly known as:  NEURONTIN TAKE ONE CAPSULE BY MOUTH AT BEDTIME   glucose blood test strip Commonly known as:  ONETOUCH VERIO 1 each by Other route daily. And lancets 1/day. Dx code E11.9.   insulin detemir 100 unit/ml Soln Commonly known as:  LEVEMIR Inject 15 Units into the skin at bedtime. What changed:  Another medication with the same name was removed. Continue taking this medication, and follow the directions you see here.   isosorbide mononitrate 60 MG 24 hr tablet Commonly known as:  IMDUR Take 1 tablet (60 mg total) by mouth daily.   levETIRAcetam 1000 MG tablet Commonly known as:  KEPPRA TAKE 1 TABLET BY MOUTH TWICE DAILY   losartan 25 MG tablet Commonly known as:  COZAAR TAKE ONE TABLET BY MOUTH DAILY What changed:  when to take this   Melatonin 3 MG Tabs Take 1 tablet (3 mg total) by mouth at bedtime.   metoprolol tartrate  50 MG tablet Commonly known as:  LOPRESSOR TAKE 1 TABLET BY MOUTH ONCE DAILY   ondansetron 4 MG tablet Commonly known as:  ZOFRAN Take 1 tablet (4 mg total) by mouth every 6 (six) hours as needed for nausea or vomiting.   oxymetazoline 0.05 % nasal spray Commonly known as:  AFRIN Place 1 spray into both nostrils 2 (two) times daily as needed (nosebleeds).   polyethylene glycol packet Commonly known as:  MIRALAX / GLYCOLAX Take 17 g by mouth daily as needed for mild constipation.   potassium chloride 10 MEQ tablet Commonly known as:  K-DUR TAKE ONE TABLET BY MOUTH DAILY   rizatriptan 10 MG tablet Commonly known as:  MAXALT TAKE 1 TABLET BY MOUTH DAILY AS NEEDED FOR CHRONIC  PAIN What changed:    how much to take  how to take this  when  to take this  reasons to take this  additional instructions   rosuvastatin 20 MG tablet Commonly known as:  CRESTOR Take 1 tablet (20 mg total) by mouth every evening. What changed:  when to take this   sertraline 50 MG tablet Commonly known as:  ZOLOFT Take 1 tablet (50 mg total) by mouth at bedtime.   sodium chloride 0.65 % Soln nasal spray Commonly known as:  OCEAN Place 2 sprays into both nostrils 3 (three) times daily. What changed:    when to take this  reasons to take this   torsemide 10 MG tablet Commonly known as:  DEMADEX Take 3 tablets (30 mg total) by mouth daily. Start taking on:  January 30, 2018   traMADol 50 MG tablet Commonly known as:  ULTRAM Take 1 tablet (50 mg total) by mouth every 6 (six) hours as needed. What changed:  reasons to take this      Follow-up Information    Briscoe Deutscher, DO. Schedule an appointment as soon as possible for a visit in 1 week(s).   Specialty:  Family Medicine Why:  Hospital follow-up Contact information: [place] [address] [phone]        Sherryl Barters, MD .   Specialty:  Cardiology Contact information: [address] [place] [address] [phone]          Allergies  Allergen Reactions  . Codeine Itching    Consultations:  Critical care medicine  Cardiology  Palliative care medicine   Procedures/Studies: Ct Head Wo Contrast  Result Date: 01/22/2018 CLINICAL DATA:  Confusion.  Unexplained bruising around eyes EXAM: CT HEAD WITHOUT CONTRAST TECHNIQUE: Contiguous axial images were obtained from the base of the skull through the vertex without intravenous contrast. COMPARISON:  CT head 07/22/2017 FINDINGS: Brain: Mild atrophy. Chronic microvascular ischemic changes in the white matter bilaterally. Negative for acute infarct, hemorrhage, or mass lesion. No midline shift. Vascular: Negative for hyperdense vessel Skull: Negative Sinuses/Orbits:  Paranasal sinuses clear. Normal orbit. No orbital fracture identified. Other: None IMPRESSION: Atrophy and chronic microvascular ischemic change in the white matter. No acute abnormality. Electronically Signed   By: Franchot Gallo M.D.   On: 01/22/2018 13:06   Portable Chest Xray  Result Date: 01/23/2018 CLINICAL DATA:  Endotracheal tube EXAM: PORTABLE CHEST 1 VIEW COMPARISON:  Yesterday FINDINGS: Endotracheal tube tip is at the clavicular heads. Left IJ line with tip at the left brachiocephalic SVC junction. An orogastric tube is coiled in the stomach. Haziness of the bilateral mid and lower chest. Cardiomegaly. No pneumothorax. IMPRESSION: 1. Stable hardware positioning. 2.  Unchanged bilateral hazy lower lung opacity likely from edema, atelectasis and pleural fluid. Electronically Signed   By: Monte Fantasia M.D.   On: 01/23/2018 04:15   Dg Chest Port 1 View  Result Date: 01/22/2018 CLINICAL DATA:  Check endotracheal tube placement EXAM: PORTABLE CHEST 1 VIEW COMPARISON:  01/22/2018 FINDINGS: Endotracheal tube is been withdrawn slightly when compared with the prior exam now lying 2 cm above the carina. It previously measured 12 mm above the carina. Nasogastric catheter is noted coiled within the stomach. Left jugular central line is again seen and stable. Cardiac shadow remains enlarged. Central vascular congestion and pulmonary edema with bilateral pleural effusions is seen. IMPRESSION: Changes consistent with CHF slightly increased from the prior exam. Tubes and lines as described above. Slight interval withdrawal of the endotracheal tube is seen. Electronically Signed   By: Inez Catalina M.D.   On: 01/22/2018 14:27   Portable Chest X-ray  Result Date: 01/22/2018 CLINICAL DATA:  ETT placement EXAM: PORTABLE CHEST 1 VIEW COMPARISON:  January 22, 2018 FINDINGS: An ET tube is been placed in the interval. The distal tip is 12 mm above the carina. Recommend withdrawing 2 cm. A left IJ has been placed in  the interval. No pneumothorax. Cardiomediastinal silhouette is unchanged. Diffuse interstitial opacities suggest pulmonary edema. An NG tube terminates below today's film. No other acute abnormalities. IMPRESSION: 1. The ETT terminates 12 mm above the carina. Recommend withdrawing 2 cm. 2. Mild pulmonary edema. These results will be called to the ordering clinician or representative by the Radiologist Assistant, and communication documented in the PACS or zVision Dashboard. Electronically Signed   By: Dorise Bullion III M.D   On: 01/22/2018 10:30   Dg Chest Port 1 View  Result Date: 01/22/2018 CLINICAL DATA:  73 year old female with shortness of breath. EXAM: PORTABLE CHEST 1 VIEW COMPARISON:  Chest radiograph dated 08/22/2017 FINDINGS: There is shallow inspiration with bibasilar atelectasis. Pneumonia is not excluded. Clinical correlation is recommended. Mild central and perihilar vascular prominence may represent mild congestion. No pneumothorax. Stable cardiac silhouette. No acute osseous pathology. IMPRESSION: Shallow inspiration with bibasilar atelectasis. Pneumonia is not excluded. Electronically Signed   By: Anner Crete M.D.   On: 01/22/2018 02:07      ETT (1/19 >> 1/23)  Transthoracic Echocardiogram (01/23/2018) Study Conclusions  - Left ventricle: The cavity size was normal. Wall thickness was increased in a pattern of mild LVH. The estimated ejection fraction was 35%. Diffuse hypokinesis. Features are consistent with a pseudonormal left ventricular filling pattern, with concomitant abnormal relaxation and increased filling pressure (grade 2 diastolic dysfunction). - Aortic valve: Trileaflet; severely calcified leaflets. There was moderate to severe stenosis. There was mild regurgitation. Mean gradient (S): 31 mm Hg. Valve area (VTI): 0.98 cm^2. - Mitral valve: Severely fibrotic annulus. Mildly calcified leaflets . There was no evidence for stenosis. There was  mild regurgitation. - Left atrium: The atrium was mildly dilated. - Right ventricle: The cavity size was mildly dilated. Systolic function was moderately reduced. - Right atrium: The atrium was mildly dilated. - Tricuspid valve: There was moderate regurgitation. Peak RV-RA gradient (S): 45 mm Hg. - Pulmonary arteries: PA peak pressure: 60 mm Hg (S). - Systemic veins: IVC measured 2.5 cm with < 50% respirophasic variation, suggesting RA pressure 15 mmHg.  Impressions:  - Normal LV size with mild LV hypertrophy. EF 35%, diffuse hypokinesis. Moderate diastolic dysfunction. Mildly dilated RV with moderately decreased systolic function. There was moderate-severe aortic stenosis (mean gradient 31 mmHg,  AVA 0.98 cm^2). Moderate pulmonary hypertenion. Dilated IVC with evidence for elevated RV filling pressure.   Subjective: Shortness of breath is baseline  Discharge Exam: Vitals:   01/29/18 0535 01/29/18 0900  BP: (!) 157/97 125/67  Pulse: 67   Resp: 16   Temp: 98.3 F (36.8 C)   SpO2: 100%    Vitals:   01/28/18 1238 01/28/18 2027 01/29/18 0535 01/29/18 0900  BP: 123/66 (!) 134/57 (!) 157/97 125/67  Pulse: (!) 54 68 67   Resp: 18 18 16    Temp: 98 F (36.7 C) 98.3 F (36.8 C) 98.3 F (36.8 C)   TempSrc: Oral Oral Oral   SpO2: 100% 100% 100%   Weight:   100.3 kg   Height:        General exam: Appears calm and comfortable Respiratory system: Rales on auscultation. Respiratory effort normal. Cardiovascular system: S1 & S2 heard, RRR. 2/6 systolic murmur. Gastrointestinal system: Abdomen is nondistended, soft and nontender. No organomegaly or masses felt. Normal bowel sounds heard. Central nervous system: Alert and oriented. No focal neurological deficits. Extremities: No calf tenderness Skin: No cyanosis. No rashes Psychiatry: Judgement and insight appear normal. Mood & affect appropriate.    The results of significant diagnostics from this  hospitalization (including imaging, microbiology, ancillary and laboratory) are listed below for reference.     Microbiology: Recent Results (from the past 240 hour(s))  MRSA PCR Screening     Status: Abnormal   Collection Time: 01/22/18 11:20 AM  Result Value Ref Range Status   MRSA by PCR POSITIVE (A) NEGATIVE Final    Comment:        The GeneXpert MRSA Assay (FDA approved for NASAL specimens only), is one component of a comprehensive MRSA colonization surveillance program. It is not intended to diagnose MRSA infection nor to guide or monitor treatment for MRSA infections. RESULT CALLED TO, READ BACK BY AND VERIFIED WITH: RN Rosalie Gums [id number] MLM Performed at [facility], [address] [place], [place] [address]      Labs: BNP (last 3 results) Recent Labs    07/21/17 1524 07/24/17 0639 01/22/18 0155  BNP 2,532.0* 2,128.9* 9,233.0*   Basic Metabolic Panel: Recent Labs  Lab 01/22/18 2109  01/23/18 0762 01/23/18 1536 01/24/18 0414 01/25/18 0440 01/26/18 0431 01/27/18 0526  NA  --    < > 140  --  140 140 138 141  K  --    < > 5.5*  --  5.0 4.1 3.9 3.9  CL  --    < > 109  --  109 104 101 101  CO2  --    < > 21*  --  24 26 28 28   GLUCOSE  --    < > 176*  --  178* 197* 325* 148*  BUN  --    < > 60*  --  73* 81* 86* 89*  CREATININE  --    < > 1.74*  --  1.75* 1.61* 1.40* 1.36*  CALCIUM  --    < > 8.6*  --  8.5* 8.5* 8.3* 8.9  MG 2.1  --  2.0 2.1  --   --   --   --   PHOS 4.6  --  5.0* 4.2  --   --   --   --    < > = values in this interval not displayed.   CBC: Recent Labs  Lab 01/23/18 [id number] 01/24/18 0414 01/25/18 0440 01/26/18 0431 01/27/18  0526  WBC 8.0 11.3* 11.0* 8.5 7.8  HGB 8.0* 8.0* 8.1* 7.9* 8.2*  HCT 27.2* 26.5* 26.5* 25.8* 27.4*  MCV 104.2* 101.1* 99.6 98.1 98.9  PLT 241 228 199 184 216   Cardiac Enzymes: Recent Labs  Lab 01/22/18 1414 01/22/18 2109 01/23/18 0608  TROPONINI 3.91* 10.71* 21.39*   BNP: Invalid input(s):  POCBNP CBG: Recent Labs  Lab 01/28/18 1640 01/28/18 2144 01/28/18 2342 01/29/18 0536 01/29/18 0732  GLUCAP 146* 140* 122* 129* 123*   Urinalysis    Component Value Date/Time   COLORURINE YELLOW 07/23/2017 1142   APPEARANCEUR CLOUDY (A) 07/23/2017 1142   LABSPEC 1.016 07/23/2017 1142   PHURINE 5.0 07/23/2017 1142   GLUCOSEU NEGATIVE 07/23/2017 1142   HGBUR SMALL (A) 07/23/2017 1142   BILIRUBINUR NEGATIVE 07/23/2017 1142   BILIRUBINUR Negative 01/21/2017 1044   KETONESUR NEGATIVE 07/23/2017 1142   PROTEINUR 100 (A) 07/23/2017 1142   UROBILINOGEN 0.2 01/21/2017 1044   UROBILINOGEN 0.2 08/28/2014 1555   NITRITE NEGATIVE 07/23/2017 1142   LEUKOCYTESUR NEGATIVE 07/23/2017 1142   Sepsis Labs Invalid input(s): PROCALCITONIN,  WBC,  LACTICIDVEN Microbiology Recent Results (from the past 240 hour(s))  MRSA PCR Screening     Status: Abnormal   Collection Time: 01/22/18 11:20 AM  Result Value Ref Range Status   MRSA by PCR POSITIVE (A) NEGATIVE Final    Comment:        The GeneXpert MRSA Assay (FDA approved for NASAL specimens only), is one component of a comprehensive MRSA colonization surveillance program. It is not intended to diagnose MRSA infection nor to guide or monitor treatment for MRSA infections. RESULT CALLED TO, READ BACK BY AND VERIFIED WITH: RN Rosalie Gums [id number] MLM Performed at [facility], [address] [place],  [address]      SIGNED:   Cordelia Poche, MD [facility] 01/29/2018, 10:54 AM

## 2018-01-29 NOTE — Progress Notes (Signed)
Verified with patient and spouse they would like to use [facility] for [facility] services. Referral made to [facility]. No DME needs. Will need PTAR to home. Spouse is at home and ready for her to come home any time. [facility] called, transportation requested. Forms on chart. No other CM needs.

## 2018-01-29 NOTE — Discharge Instructions (Signed)
[name],  You were here because of trouble breathing. This was because of fluid in your lungs from heart failure which was likely precipitated by your aortic valves stenosis. It was recommended that you have a valve repair, which you declined. It was also recommended that you go to rehab, which you declined. You have been stabilized and are back at your home oxygen requirement. You will be discharged with home health and some new medications. Please follow-up with your primary care physician in addition to your cardiologist.

## 2018-01-29 NOTE — Progress Notes (Signed)
Patient sleeping during shift report.      

## 2018-01-30 ENCOUNTER — Telehealth: Payer: Self-pay | Admitting: Family Medicine

## 2018-01-30 ENCOUNTER — Telehealth: Payer: Self-pay

## 2018-01-30 DIAGNOSIS — Z9981 Dependence on supplemental oxygen: Secondary | ICD-10-CM | POA: Diagnosis not present

## 2018-01-30 DIAGNOSIS — Z794 Long term (current) use of insulin: Secondary | ICD-10-CM | POA: Diagnosis not present

## 2018-01-30 DIAGNOSIS — J9621 Acute and chronic respiratory failure with hypoxia: Secondary | ICD-10-CM | POA: Diagnosis not present

## 2018-01-30 DIAGNOSIS — I10 Essential (primary) hypertension: Secondary | ICD-10-CM | POA: Diagnosis not present

## 2018-01-30 DIAGNOSIS — I2510 Atherosclerotic heart disease of native coronary artery without angina pectoris: Secondary | ICD-10-CM | POA: Diagnosis not present

## 2018-01-30 DIAGNOSIS — I5043 Acute on chronic combined systolic (congestive) and diastolic (congestive) heart failure: Secondary | ICD-10-CM | POA: Diagnosis not present

## 2018-01-30 DIAGNOSIS — Z4800 Encounter for change or removal of nonsurgical wound dressing: Secondary | ICD-10-CM | POA: Diagnosis not present

## 2018-01-30 DIAGNOSIS — E1122 Type 2 diabetes mellitus with diabetic chronic kidney disease: Secondary | ICD-10-CM | POA: Diagnosis not present

## 2018-01-30 DIAGNOSIS — Z7902 Long term (current) use of antithrombotics/antiplatelets: Secondary | ICD-10-CM | POA: Diagnosis not present

## 2018-01-30 DIAGNOSIS — E1159 Type 2 diabetes mellitus with other circulatory complications: Secondary | ICD-10-CM | POA: Diagnosis not present

## 2018-01-30 DIAGNOSIS — N183 Chronic kidney disease, stage 3 (moderate): Secondary | ICD-10-CM | POA: Diagnosis not present

## 2018-01-30 DIAGNOSIS — Z79899 Other long term (current) drug therapy: Secondary | ICD-10-CM | POA: Diagnosis not present

## 2018-01-30 DIAGNOSIS — L89612 Pressure ulcer of right heel, stage 2: Secondary | ICD-10-CM | POA: Diagnosis not present

## 2018-01-30 DIAGNOSIS — Z6841 Body Mass Index (BMI) 40.0 and over, adult: Secondary | ICD-10-CM | POA: Diagnosis not present

## 2018-01-30 NOTE — Telephone Encounter (Signed)
Ph has app tomorrow holding for then

## 2018-01-30 NOTE — Telephone Encounter (Signed)
Attempted to reach patient through her daughter, [name].  LM on Teresa's VM to return call.

## 2018-01-30 NOTE — Telephone Encounter (Signed)
Attempted to call patient for TCM /Hospital follow up call.  Patient did not answer and voicemail was full.

## 2018-01-30 NOTE — Telephone Encounter (Signed)
Copied from [place] [phone]. Topic: Quick Communication - Home Health Verbal Orders >> Jan 30, 2018  2:31 PM Scherrie Gerlach wrote: Caller/Agency: [facility]. // christy Callback Number: [phone] Requesting verbal orders:  1.  skilled nursing        2 wk 3       1 wk 6 2. Add a home health aid       2 wk 4       1 wk 5 3. Need Social worker eval 4. Clarify wound care orders.  Daily dressing change. Heel is healing very well.  Does she need to continue applying santyl to wound?

## 2018-01-30 NOTE — Telephone Encounter (Signed)
See note

## 2018-01-30 NOTE — Progress Notes (Deleted)
[name] is a 73 y.o. female is here for follow up.  History of Present Illness:   (SCRIBE ATTESTATION)  HPI:   Health Maintenance Due  Topic Date Due  . OPHTHALMOLOGY EXAM  04/20/1955  . TETANUS/TDAP  04/19/1964  . MAMMOGRAM  04/20/1995  . DEXA SCAN  04/20/2010  . PNA vac Low Risk Adult (1 of 2 - PCV13) 04/20/2010  . COLONOSCOPY  04/26/2016   Depression screen PHQ 2/9 10/04/2017  Decreased Interest 0  Down, Depressed, Hopeless 0  PHQ - 2 Score 0  Altered sleeping 2  Tired, decreased energy 0  Change in appetite 0  Feeling bad or failure about yourself  0  Trouble concentrating 0  Moving slowly or fidgety/restless 0  Suicidal thoughts 0  PHQ-9 Score 2  Difficult doing work/chores Not difficult at all   PMHx, SurgHx, SocialHx, FamHx, Medications, and Allergies were reviewed in the Visit Navigator and updated as appropriate.   Patient Active Problem List   Diagnosis Date Noted  . Goals of care, counseling/discussion   . Palliative care by specialist   . NSTEMI (non-ST elevated myocardial infarction) ([place]) 01/22/2018  . Insulin-requiring or dependent type II diabetes mellitus ([place]) 01/22/2018  . Chronic respiratory failure with hypoxia ([place]) 01/22/2018  . Pressure injury of skin 01/22/2018  . Acute respiratory failure with hypoxia ([place])   . Anxiety 11/02/2017  . Pressure injury of skin of right heel 11/02/2017  . Long term (current) use of aspirin 09/12/2017  . Mixed hyperlipidemia 09/12/2017  . CKD (chronic kidney disease), stage III ([place]) 09/12/2017  . Impaired mobility and ADLs 08/29/2017  . Cerebral thrombosis with cerebral infarction 07/27/2017  . Anemia of chronic disease 07/22/2017  . Congestive heart failure ([place]) 06/20/2017  . LV dysfunction 10/06/2016  . 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 Acute on chronic combined systolic and diastolic CHF (congestive heart failure) ([place]) 09/09/2016    . Migraine 08/20/2016  . Morbid obesity ([place]) 08/20/2016  . DOE (dyspnea on exertion), on O2 10/02/2015  . Hyperlipidemia associated with type 2 diabetes mellitus ([place]), on Crestor 10/02/2015  . Nonrheumatic aortic valve stenosis 10/02/2015  . Old MI (myocardial infarction) 10/02/2015  . Primary insomnia 11/03/2014  . Weakness generalized 08/28/2014  . Seizure disorder ([place]) 08/28/2014  . Coronary artery disease of native artery of native heart with stable angina pectoris ([place]) 06/02/2010  . Depression, recurrent ([place]), on Zoloft 05/20/2007  . Hypertension associated with diabetes ([place]) 05/20/2007  . Diaphragmatic hernia 05/20/2007  . Diverticulosis of colon 05/20/2007  . IBS 05/20/2007  . Fatty liver 05/20/2007  . Hypothyroidism 05/20/2007  . Hx of transient ischemic attack (TIA) 05/20/2007   Social History   Tobacco Use  . Smoking status: Former Smoker    Last attempt to quit: 06/02/1970    Years since quitting: 47.6  . Smokeless tobacco: Never Used  Substance Use Topics  . Alcohol use: No  . Drug use: No   Current Medications and Allergies   Current Outpatient Medications:  .  acetaminophen (TYLENOL) 500 MG tablet, Take 1,000 mg by mouth 2 (two) times daily. For leg pain, Disp: , Rfl:  .  albuterol (PROVENTIL HFA;VENTOLIN HFA) 108 (90 Base) MCG/ACT inhaler, Inhale 2 puffs into the lungs every 6 (six) hours as needed for wheezing or shortness of breath. (Patient not taking: Reported on 01/22/2018), Disp: 1 Inhaler, Rfl: 3 .  ALPRAZolam (XANAX) 0.5 MG tablet, Take 1 tablet (0.5  mg total) by mouth at bedtime., Disp: 30 tablet, Rfl: 3 .  aspirin 81 MG chewable tablet, Chew 1 tablet (81 mg total) by mouth daily., Disp: 30 tablet, Rfl: 0 .  clopidogrel (PLAVIX) 75 MG tablet, TAKE ONE TABLET BY MOUTH DAILY (Patient taking differently: Take 75 mg by mouth at bedtime. ), Disp: 30 tablet, Rfl: 0 .  collagenase (SANTYL) ointment, Apply 1 application topically daily. (Patient taking  differently: Apply 1 application topically daily. Apply to feet), Disp: 30 g, Rfl: 0 .  feeding supplement, ENSURE ENLIVE, (ENSURE ENLIVE) LIQD, Take 237 mLs by mouth 2 (two) times daily between meals., Disp: , Rfl:  .  gabapentin (NEURONTIN) 300 MG capsule, TAKE ONE CAPSULE BY MOUTH AT BEDTIME (Patient taking differently: Take 300 mg by mouth at bedtime. ), Disp: 90 capsule, Rfl: 0 .  glucose blood (ONETOUCH VERIO) test strip, 1 each by Other route daily. And lancets 1/day. Dx code E11.9., Disp: 100 each, Rfl: 3 .  insulin detemir (LEVEMIR) 100 unit/ml SOLN, Inject 15 Units into the skin at bedtime., Disp: , Rfl:  .  isosorbide mononitrate (IMDUR) 60 MG 24 hr tablet, Take 1 tablet (60 mg total) by mouth daily., Disp: 30 tablet, Rfl: 0 .  levETIRAcetam (KEPPRA) 1000 MG tablet, TAKE 1 TABLET BY MOUTH TWICE DAILY (Patient taking differently: Take 1,000 mg by mouth 2 (two) times daily. ), Disp: 60 tablet, Rfl: 1 .  losartan (COZAAR) 25 MG tablet, TAKE ONE TABLET BY MOUTH DAILY (Patient taking differently: Take 25 mg by mouth at bedtime. ), Disp: 30 tablet, Rfl: 6 .  Melatonin 3 MG TABS, Take 1 tablet (3 mg total) by mouth at bedtime., Disp: 30 tablet, Rfl: 0 .  metoprolol tartrate (LOPRESSOR) 50 MG tablet, TAKE 1 TABLET BY MOUTH ONCE DAILY (Patient taking differently: Take 50 mg by mouth daily. ), Disp: 30 tablet, Rfl: 1 .  ondansetron (ZOFRAN) 4 MG tablet, Take 1 tablet (4 mg total) by mouth every 6 (six) hours as needed for nausea or vomiting., Disp: 20 tablet, Rfl: 0 .  oxymetazoline (AFRIN) 0.05 % nasal spray, Place 1 spray into both nostrils 2 (two) times daily as needed (nosebleeds)., Disp: 30 mL, Rfl: 0 .  polyethylene glycol (MIRALAX / GLYCOLAX) packet, Take 17 g by mouth daily as needed for mild constipation. (Patient not taking: Reported on 01/22/2018), Disp: 14 each, Rfl: 0 .  potassium chloride (K-DUR) 10 MEQ tablet, TAKE ONE TABLET BY MOUTH DAILY (Patient taking differently: Take 10 mEq by  mouth daily. ), Disp: 30 tablet, Rfl: 3 .  rizatriptan (MAXALT) 10 MG tablet, TAKE 1 TABLET BY MOUTH DAILY AS NEEDED FOR CHRONIC  PAIN (Patient taking differently: Take 10 mg by mouth daily as needed for migraine. ), Disp: 10 tablet, Rfl: 0 .  rosuvastatin (CRESTOR) 20 MG tablet, Take 1 tablet (20 mg total) by mouth every evening. (Patient taking differently: Take 20 mg by mouth at bedtime. ), Disp: 30 tablet, Rfl: 0 .  sertraline (ZOLOFT) 50 MG tablet, Take 1 tablet (50 mg total) by mouth at bedtime., Disp: 90 tablet, Rfl: 3 .  sodium chloride (OCEAN) 0.65 % SOLN nasal spray, Place 2 sprays into both nostrils 3 (three) times daily. (Patient taking differently: Place 2 sprays into both nostrils 3 (three) times daily as needed for congestion. ), Disp: 15 mL, Rfl: 0 .  torsemide (DEMADEX) 10 MG tablet, Take 3 tablets (30 mg total) by mouth daily., Disp: 30 tablet, Rfl: 0 .  traMADol (ULTRAM)  50 MG tablet, Take 1 tablet (50 mg total) by mouth every 6 (six) hours as needed. (Patient taking differently: Take 50 mg by mouth every 6 (six) hours as needed (pain). ), Disp: 30 tablet, Rfl: 0  Allergies  Allergen Reactions  . Codeine Itching   Review of Systems   Pertinent items are noted in the HPI. Otherwise, a complete ROS is negative.  Vitals  There were no vitals filed for this visit.   There is no height or weight on file to calculate BMI.  Physical Exam   Physical Exam  Results for orders placed or performed during the hospital encounter of 01/22/18  MRSA PCR Screening  Result Value Ref Range   MRSA by PCR POSITIVE (A) NEGATIVE  Basic metabolic panel  Result Value Ref Range   Sodium 142 135 - 145 mmol/L   Potassium 5.4 (H) 3.5 - 5.1 mmol/L   Chloride 110 98 - 111 mmol/L   CO2 19 (L) 22 - 32 mmol/L   Glucose, Bld 208 (H) 70 - 99 mg/dL   BUN 39 (H) 8 - 23 mg/dL   Creatinine, Ser 1.10 (H) 0.44 - 1.00 mg/dL   Calcium 9.4 8.9 - 10.3 mg/dL   GFR calc non Af Amer 50 (L) >60 mL/min   GFR  calc Af Amer 58 (L) >60 mL/min   Anion gap 13 5 - 15  Brain natriuretic peptide  Result Value Ref Range   B Natriuretic Peptide 2,778.0 (H) 0.0 - 100.0 pg/mL  Troponin I - Once  Result Value Ref Range   Troponin I 2.63 (HH) <0.03 ng/mL  CBC with Differential  Result Value Ref Range   WBC 9.5 4.0 - 10.5 K/uL   RBC 3.23 (L) 3.87 - 5.11 MIL/uL   Hemoglobin 9.4 (L) 12.0 - 15.0 g/dL   HCT 32.7 (L) 36.0 - 46.0 %   MCV 101.2 (H) 80.0 - 100.0 fL   MCH 29.1 26.0 - 34.0 pg   MCHC 28.7 (L) 30.0 - 36.0 g/dL   RDW 16.1 (H) 11.5 - 15.5 %   Platelets 298 150 - 400 K/uL   nRBC 0.0 0.0 - 0.2 %   Neutrophils Relative % 67 %   Neutro Abs 6.3 1.7 - 7.7 K/uL   Lymphocytes Relative 26 %   Lymphs Abs 2.5 0.7 - 4.0 K/uL   Monocytes Relative 6 %   Monocytes Absolute 0.6 0.1 - 1.0 K/uL   Eosinophils Relative 1 %   Eosinophils Absolute 0.1 0.0 - 0.5 K/uL   Basophils Relative 0 %   Basophils Absolute 0.0 0.0 - 0.1 K/uL   Immature Granulocytes 0 %   Abs Immature Granulocytes 0.04 0.00 - 0.07 K/uL  Troponin I - Now Then Q6H  Result Value Ref Range   Troponin I 2.66 (HH) <0.03 ng/mL  Troponin I - Now Then Q6H  Result Value Ref Range   Troponin I 3.91 (HH) <0.03 ng/mL  Troponin I - Now Then Q6H  Result Value Ref Range   Troponin I 10.71 (HH) <0.03 ng/mL  Blood gas, arterial  Result Value Ref Range   FIO2 50.00    Delivery systems BILEVEL POSITIVE AIRWAY PRESSURE    Inspiratory PAP 12    Expiratory PAP 6.0    pH, Arterial 7.192 (LL) 7.350 - 7.450   pCO2 arterial 51.1 (H) 32.0 - 48.0 mmHg   pO2, Arterial 132 (H) 83.0 - 108.0 mmHg   Bicarbonate 18.9 (L) 20.0 - 28.0 mmol/L   Acid-base deficit  7.9 (H) 0.0 - 2.0 mmol/L   O2 Saturation 97.8 %   Patient temperature 98.6    Collection site LEFT RADIAL    Drawn by [id number]    Sample type ARTERIAL DRAW    Allens test (pass/fail) PASS PASS  Glucose, capillary  Result Value Ref Range   Glucose-Capillary 294 (H) 70 - 99 mg/dL  Draw ABG 1 hour after  initiation of ventilator  Result Value Ref Range   FIO2 1.00    Delivery systems VENTILATOR    Mode PRESSURE REGULATED VOLUME CONTROL    VT 350 mL   LHR 14 resp/min   Peep/cpap 5.0 cm H20   pH, Arterial 7.094 (LL) 7.350 - 7.450   pCO2 arterial 72.3 (HH) 32.0 - 48.0 mmHg   pO2, Arterial 139 (H) 83.0 - 108.0 mmHg   Bicarbonate 21.2 20.0 - 28.0 mmol/L   Acid-base deficit 7.3 (H) 0.0 - 2.0 mmol/L   O2 Saturation 97.3 %   Patient temperature 98.6    Collection site RIGHT RADIAL    Drawn by [id number]    Sample type ARTERIAL DRAW    Allens test (pass/fail) PASS PASS  Magnesium  Result Value Ref Range   Magnesium 2.1 1.7 - 2.4 mg/dL  Magnesium  Result Value Ref Range   Magnesium 2.1 1.7 - 2.4 mg/dL  Phosphorus  Result Value Ref Range   Phosphorus 5.2 (H) 2.5 - 4.6 mg/dL  Phosphorus  Result Value Ref Range   Phosphorus 4.6 2.5 - 4.6 mg/dL  Glucose, capillary  Result Value Ref Range   Glucose-Capillary 281 (H) 70 - 99 mg/dL  Blood gas, arterial  Result Value Ref Range   FIO2 80.00    Delivery systems VENTILATOR    Mode PRESSURE REGULATED VOLUME CONTROL    VT 350.0 mL   LHR 26.0 resp/min   Peep/cpap 5.0 cm H20   pH, Arterial 7.118 (LL) 7.350 - 7.450   pCO2 arterial 65.2 (HH) 32.0 - 48.0 mmHg   pO2, Arterial 195 (H) 83.0 - 108.0 mmHg   Bicarbonate 20.2 20.0 - 28.0 mmol/L   Acid-base deficit 7.8 (H) 0.0 - 2.0 mmol/L   O2 Saturation 98.6 %   Patient temperature 98.6    Collection site LEFT RADIAL    Drawn by [id number]    Sample type ARTERIAL DRAW    Allens test (pass/fail) PASS PASS  Glucose, capillary  Result Value Ref Range   Glucose-Capillary 320 (H) 70 - 99 mg/dL  Glucose, capillary  Result Value Ref Range   Glucose-Capillary 281 (H) 70 - 99 mg/dL  CBC  Result Value Ref Range   WBC 8.0 4.0 - 10.5 K/uL   RBC 2.61 (L) 3.87 - 5.11 MIL/uL   Hemoglobin 8.0 (L) 12.0 - 15.0 g/dL   HCT 27.2 (L) 36.0 - 46.0 %   MCV 104.2 (H) 80.0 - 100.0 fL   MCH 30.7 26.0 - 34.0 pg   MCHC  29.4 (L) 30.0 - 36.0 g/dL   RDW 16.3 (H) 11.5 - 15.5 %   Platelets 241 150 - 400 K/uL   nRBC 0.0 0.0 - 0.2 %  Basic metabolic panel  Result Value Ref Range   Sodium 140 135 - 145 mmol/L   Potassium 5.5 (H) 3.5 - 5.1 mmol/L   Chloride 109 98 - 111 mmol/L   CO2 21 (L) 22 - 32 mmol/L   Glucose, Bld 176 (H) 70 - 99 mg/dL   BUN 60 (H) 8 - 23 mg/dL   Creatinine, Ser  1.74 (H) 0.44 - 1.00 mg/dL   Calcium 8.6 (L) 8.9 - 10.3 mg/dL   GFR calc non Af Amer 29 (L) >60 mL/min   GFR calc Af Amer 33 (L) >60 mL/min   Anion gap 10 5 - 15  Magnesium  Result Value Ref Range   Magnesium 2.0 1.7 - 2.4 mg/dL  Phosphorus  Result Value Ref Range   Phosphorus 5.0 (H) 2.5 - 4.6 mg/dL  Glucose, capillary  Result Value Ref Range   Glucose-Capillary 241 (H) 70 - 99 mg/dL  APTT  Result Value Ref Range   aPTT 31 24 - 36 seconds  Protime-INR  Result Value Ref Range   Prothrombin Time 16.6 (H) 11.4 - 15.2 seconds   INR 9.37   Basic metabolic panel  Result Value Ref Range   Sodium 135 135 - 145 mmol/L   Potassium 6.0 (H) 3.5 - 5.1 mmol/L   Chloride 106 98 - 111 mmol/L   CO2 22 22 - 32 mmol/L   Glucose, Bld 256 (H) 70 - 99 mg/dL   BUN 54 (H) 8 - 23 mg/dL   Creatinine, Ser 1.47 (H) 0.44 - 1.00 mg/dL   Calcium 8.7 (L) 8.9 - 10.3 mg/dL   GFR calc non Af Amer 35 (L) >60 mL/min   GFR calc Af Amer 41 (L) >60 mL/min   Anion gap 7 5 - 15  Troponin I - Tomorrow AM 0500  Result Value Ref Range   Troponin I 21.39 (HH) <0.03 ng/mL  Heparin level (unfractionated)  Result Value Ref Range   Heparin Unfractionated 0.46 0.30 - 0.70 IU/mL  Glucose, capillary  Result Value Ref Range   Glucose-Capillary 246 (H) 70 - 99 mg/dL  Na and K (sodium & potassium), rand urine  Result Value Ref Range   Sodium, Ur 62 mmol/L   Potassium Urine 48 mmol/L  Glucose, capillary  Result Value Ref Range   Glucose-Capillary 192 (H) 70 - 99 mg/dL  Glucose, capillary  Result Value Ref Range   Glucose-Capillary 135 (H) 70 - 99 mg/dL   Glucose, capillary  Result Value Ref Range   Glucose-Capillary 126 (H) 70 - 99 mg/dL  Glucose, capillary  Result Value Ref Range   Glucose-Capillary 137 (H) 70 - 99 mg/dL  Heparin level (unfractionated)  Result Value Ref Range   Heparin Unfractionated 0.48 0.30 - 0.70 IU/mL  Magnesium  Result Value Ref Range   Magnesium 2.1 1.7 - 2.4 mg/dL  Phosphorus  Result Value Ref Range   Phosphorus 4.2 2.5 - 4.6 mg/dL  CBC  Result Value Ref Range   WBC 11.3 (H) 4.0 - 10.5 K/uL   RBC 2.62 (L) 3.87 - 5.11 MIL/uL   Hemoglobin 8.0 (L) 12.0 - 15.0 g/dL   HCT 26.5 (L) 36.0 - 46.0 %   MCV 101.1 (H) 80.0 - 100.0 fL   MCH 30.5 26.0 - 34.0 pg   MCHC 30.2 30.0 - 36.0 g/dL   RDW 16.5 (H) 11.5 - 15.5 %   Platelets 228 150 - 400 K/uL   nRBC 0.0 0.0 - 0.2 %  Basic metabolic panel  Result Value Ref Range   Sodium 140 135 - 145 mmol/L   Potassium 5.0 3.5 - 5.1 mmol/L   Chloride 109 98 - 111 mmol/L   CO2 24 22 - 32 mmol/L   Glucose, Bld 178 (H) 70 - 99 mg/dL   BUN 73 (H) 8 - 23 mg/dL   Creatinine, Ser 1.75 (H) 0.44 - 1.00 mg/dL  Calcium 8.5 (L) 8.9 - 10.3 mg/dL   GFR calc non Af Amer 29 (L) >60 mL/min   GFR calc Af Amer 33 (L) >60 mL/min   Anion gap 7 5 - 15  Heparin level (unfractionated)  Result Value Ref Range   Heparin Unfractionated 0.59 0.30 - 0.70 IU/mL  Glucose, capillary  Result Value Ref Range   Glucose-Capillary 156 (H) 70 - 99 mg/dL  Glucose, capillary  Result Value Ref Range   Glucose-Capillary 195 (H) 70 - 99 mg/dL  Glucose, capillary  Result Value Ref Range   Glucose-Capillary 153 (H) 70 - 99 mg/dL  Glucose, capillary  Result Value Ref Range   Glucose-Capillary 107 (H) 70 - 99 mg/dL  Glucose, capillary  Result Value Ref Range   Glucose-Capillary 199 (H) 70 - 99 mg/dL  Glucose, capillary  Result Value Ref Range   Glucose-Capillary 189 (H) 70 - 99 mg/dL  CBC  Result Value Ref Range   WBC 11.0 (H) 4.0 - 10.5 K/uL   RBC 2.66 (L) 3.87 - 5.11 MIL/uL   Hemoglobin 8.1  (L) 12.0 - 15.0 g/dL   HCT 26.5 (L) 36.0 - 46.0 %   MCV 99.6 80.0 - 100.0 fL   MCH 30.5 26.0 - 34.0 pg   MCHC 30.6 30.0 - 36.0 g/dL   RDW 16.3 (H) 11.5 - 15.5 %   Platelets 199 150 - 400 K/uL   nRBC 0.0 0.0 - 0.2 %  Basic metabolic panel  Result Value Ref Range   Sodium 140 135 - 145 mmol/L   Potassium 4.1 3.5 - 5.1 mmol/L   Chloride 104 98 - 111 mmol/L   CO2 26 22 - 32 mmol/L   Glucose, Bld 197 (H) 70 - 99 mg/dL   BUN 81 (H) 8 - 23 mg/dL   Creatinine, Ser 1.61 (H) 0.44 - 1.00 mg/dL   Calcium 8.5 (L) 8.9 - 10.3 mg/dL   GFR calc non Af Amer 32 (L) >60 mL/min   GFR calc Af Amer 37 (L) >60 mL/min   Anion gap 10 5 - 15  Heparin level (unfractionated)  Result Value Ref Range   Heparin Unfractionated 0.80 (H) 0.30 - 0.70 IU/mL  Glucose, capillary  Result Value Ref Range   Glucose-Capillary 177 (H) 70 - 99 mg/dL  Glucose, capillary  Result Value Ref Range   Glucose-Capillary 182 (H) 70 - 99 mg/dL  Glucose, capillary  Result Value Ref Range   Glucose-Capillary 164 (H) 70 - 99 mg/dL  Glucose, capillary  Result Value Ref Range   Glucose-Capillary 181 (H) 70 - 99 mg/dL  Heparin level (unfractionated)  Result Value Ref Range   Heparin Unfractionated 0.53 0.30 - 0.70 IU/mL  Glucose, capillary  Result Value Ref Range   Glucose-Capillary 222 (H) 70 - 99 mg/dL  Glucose, capillary  Result Value Ref Range   Glucose-Capillary 177 (H) 70 - 99 mg/dL  CBC  Result Value Ref Range   WBC 8.5 4.0 - 10.5 K/uL   RBC 2.63 (L) 3.87 - 5.11 MIL/uL   Hemoglobin 7.9 (L) 12.0 - 15.0 g/dL   HCT 25.8 (L) 36.0 - 46.0 %   MCV 98.1 80.0 - 100.0 fL   MCH 30.0 26.0 - 34.0 pg   MCHC 30.6 30.0 - 36.0 g/dL   RDW 16.3 (H) 11.5 - 15.5 %   Platelets 184 150 - 400 K/uL   nRBC 0.0 0.0 - 0.2 %  Basic metabolic panel  Result Value Ref Range   Sodium 138  135 - 145 mmol/L   Potassium 3.9 3.5 - 5.1 mmol/L   Chloride 101 98 - 111 mmol/L   CO2 28 22 - 32 mmol/L   Glucose, Bld 325 (H) 70 - 99 mg/dL   BUN 86  (H) 8 - 23 mg/dL   Creatinine, Ser 1.40 (H) 0.44 - 1.00 mg/dL   Calcium 8.3 (L) 8.9 - 10.3 mg/dL   GFR calc non Af Amer 37 (L) >60 mL/min   GFR calc Af Amer 43 (L) >60 mL/min   Anion gap 9 5 - 15  Heparin level (unfractionated)  Result Value Ref Range   Heparin Unfractionated 0.46 0.30 - 0.70 IU/mL  Glucose, capillary  Result Value Ref Range   Glucose-Capillary 230 (H) 70 - 99 mg/dL  Glucose, capillary  Result Value Ref Range   Glucose-Capillary 266 (H) 70 - 99 mg/dL  Glucose, capillary  Result Value Ref Range   Glucose-Capillary 299 (H) 70 - 99 mg/dL  Glucose, capillary  Result Value Ref Range   Glucose-Capillary 244 (H) 70 - 99 mg/dL  Glucose, capillary  Result Value Ref Range   Glucose-Capillary 228 (H) 70 - 99 mg/dL  Glucose, capillary  Result Value Ref Range   Glucose-Capillary 173 (H) 70 - 99 mg/dL  CBC  Result Value Ref Range   WBC 7.8 4.0 - 10.5 K/uL   RBC 2.77 (L) 3.87 - 5.11 MIL/uL   Hemoglobin 8.2 (L) 12.0 - 15.0 g/dL   HCT 27.4 (L) 36.0 - 46.0 %   MCV 98.9 80.0 - 100.0 fL   MCH 29.6 26.0 - 34.0 pg   MCHC 29.9 (L) 30.0 - 36.0 g/dL   RDW 15.9 (H) 11.5 - 15.5 %   Platelets 216 150 - 400 K/uL   nRBC 0.0 0.0 - 0.2 %  Basic metabolic panel  Result Value Ref Range   Sodium 141 135 - 145 mmol/L   Potassium 3.9 3.5 - 5.1 mmol/L   Chloride 101 98 - 111 mmol/L   CO2 28 22 - 32 mmol/L   Glucose, Bld 148 (H) 70 - 99 mg/dL   BUN 89 (H) 8 - 23 mg/dL   Creatinine, Ser 1.36 (H) 0.44 - 1.00 mg/dL   Calcium 8.9 8.9 - 10.3 mg/dL   GFR calc non Af Amer 39 (L) >60 mL/min   GFR calc Af Amer 45 (L) >60 mL/min   Anion gap 12 5 - 15  Heparin level (unfractionated)  Result Value Ref Range   Heparin Unfractionated 0.55 0.30 - 0.70 IU/mL  Glucose, capillary  Result Value Ref Range   Glucose-Capillary 162 (H) 70 - 99 mg/dL  Glucose, capillary  Result Value Ref Range   Glucose-Capillary 176 (H) 70 - 99 mg/dL  Glucose, capillary  Result Value Ref Range   Glucose-Capillary  150 (H) 70 - 99 mg/dL  Glucose, capillary  Result Value Ref Range   Glucose-Capillary 114 (H) 70 - 99 mg/dL  Glucose, capillary  Result Value Ref Range   Glucose-Capillary 128 (H) 70 - 99 mg/dL  Glucose, capillary  Result Value Ref Range   Glucose-Capillary 174 (H) 70 - 99 mg/dL   Comment 1 Notify RN   Glucose, capillary  Result Value Ref Range   Glucose-Capillary 149 (H) 70 - 99 mg/dL  Glucose, capillary  Result Value Ref Range   Glucose-Capillary 122 (H) 70 - 99 mg/dL  Glucose, capillary  Result Value Ref Range   Glucose-Capillary 116 (H) 70 - 99 mg/dL  Glucose, capillary  Result Value  Ref Range   Glucose-Capillary 145 (H) 70 - 99 mg/dL   Comment 1 Notify RN    Comment 2 Document in Chart   Glucose, capillary  Result Value Ref Range   Glucose-Capillary 152 (H) 70 - 99 mg/dL   Comment 1 Notify RN    Comment 2 Document in Chart   Glucose, capillary  Result Value Ref Range   Glucose-Capillary 146 (H) 70 - 99 mg/dL   Comment 1 Notify RN    Comment 2 Document in Chart   Glucose, capillary  Result Value Ref Range   Glucose-Capillary 140 (H) 70 - 99 mg/dL  Glucose, capillary  Result Value Ref Range   Glucose-Capillary 122 (H) 70 - 99 mg/dL  Glucose, capillary  Result Value Ref Range   Glucose-Capillary 129 (H) 70 - 99 mg/dL   Comment 1 Notify RN    Comment 2 Document in Chart   Glucose, capillary  Result Value Ref Range   Glucose-Capillary 123 (H) 70 - 99 mg/dL   Comment 1 Notify RN    Comment 2 Document in Chart   Glucose, capillary  Result Value Ref Range   Glucose-Capillary 175 (H) 70 - 99 mg/dL   Comment 1 Notify RN    Comment 2 Document in Chart   Glucose, capillary  Result Value Ref Range   Glucose-Capillary 124 (H) 70 - 99 mg/dL   Comment 1 Notify RN    Comment 2 Document in Chart   I-STAT 3, arterial blood gas (G3+)  Result Value Ref Range   pH, Arterial 7.320 (L) 7.350 - 7.450   pCO2 arterial 46.1 32.0 - 48.0 mmHg   pO2, Arterial 197.0 (H) 83.0 -  108.0 mmHg   Bicarbonate 24.0 20.0 - 28.0 mmol/L   TCO2 25 22 - 32 mmol/L   O2 Saturation 100.0 %   Acid-base deficit 2.0 0.0 - 2.0 mmol/L   Patient temperature 97.2 F    Collection site RADIAL, ALLEN'S TEST ACCEPTABLE    Drawn by RT    Sample type ARTERIAL   ECHOCARDIOGRAM COMPLETE  Result Value Ref Range   Weight 3,693.15 oz   Height 59 in   BP 112/53 mmHg    Assessment and Plan   There are no diagnoses linked to this encounter.  . Orders and follow up as documented in [facility], reviewed diet, exercise and weight control, cardiovascular risk and specific lipid/LDL goals reviewed, reviewed medications and side effects in detail.  . Reviewed expectations re: course of current medical issues. . Outlined signs and symptoms indicating need for more acute intervention. . Patient verbalized understanding and all questions were answered. . Patient received an After Visit Summary.  *** CMA served as Education administrator during this visit. History, Physical, and Plan performed by medical provider. The above documentation has been reviewed and is accurate and complete. Briscoe Deutscher, D.O.  Briscoe Deutscher, DO [place], [facility] 01/30/2018

## 2018-01-31 ENCOUNTER — Ambulatory Visit: Payer: Medicare Other | Admitting: Family Medicine

## 2018-01-31 ENCOUNTER — Telehealth: Payer: Self-pay | Admitting: Family Medicine

## 2018-01-31 ENCOUNTER — Telehealth: Payer: Self-pay

## 2018-01-31 DIAGNOSIS — Z0289 Encounter for other administrative examinations: Secondary | ICD-10-CM

## 2018-01-31 NOTE — Telephone Encounter (Signed)
Called daughter-Teresa Tomasita Crumble and left detailed  voicemail message on her cell phone about possibly bringing patient in 30 mins early for her appt today.  CRM created.

## 2018-01-31 NOTE — Telephone Encounter (Signed)
See note  Copied from [place] [id number]. Topic: General - Other >> Jan 31, 2018 11:34 AM Oneta Rack wrote: Caller name: Tresa Moore  Relation to pt: PT from [facility]  Call back number: [phone]    Reason for call:  Patient requesting delay in PT eval and unclear when she is willing to do so.

## 2018-02-01 ENCOUNTER — Encounter: Payer: Self-pay | Admitting: Family Medicine

## 2018-02-01 ENCOUNTER — Other Ambulatory Visit: Payer: Self-pay | Admitting: Family Medicine

## 2018-02-01 DIAGNOSIS — I5043 Acute on chronic combined systolic (congestive) and diastolic (congestive) heart failure: Secondary | ICD-10-CM | POA: Diagnosis not present

## 2018-02-01 DIAGNOSIS — E1159 Type 2 diabetes mellitus with other circulatory complications: Secondary | ICD-10-CM | POA: Diagnosis not present

## 2018-02-01 DIAGNOSIS — I2510 Atherosclerotic heart disease of native coronary artery without angina pectoris: Secondary | ICD-10-CM | POA: Diagnosis not present

## 2018-02-01 DIAGNOSIS — I10 Essential (primary) hypertension: Secondary | ICD-10-CM | POA: Diagnosis not present

## 2018-02-01 DIAGNOSIS — J9621 Acute and chronic respiratory failure with hypoxia: Secondary | ICD-10-CM | POA: Diagnosis not present

## 2018-02-01 DIAGNOSIS — G43009 Migraine without aura, not intractable, without status migrainosus: Secondary | ICD-10-CM

## 2018-02-01 DIAGNOSIS — L89612 Pressure ulcer of right heel, stage 2: Secondary | ICD-10-CM | POA: Diagnosis not present

## 2018-02-01 MED ORDER — RIZATRIPTAN BENZOATE 10 MG PO TABS: ORAL_TABLET | ORAL | 1 refills | Status: AC

## 2018-02-01 NOTE — Telephone Encounter (Signed)
Patient no showed follow up ok to give any orders?

## 2018-02-01 NOTE — Addendum Note (Signed)
Addended by: Matilde Sprang on: 02/01/2018 03:08 PM   Modules accepted: Orders

## 2018-02-01 NOTE — Telephone Encounter (Signed)
Patient no-showed yesterday after reminder call. Third time. She will be dismissed from the practice. Okay to sign PT today BUT let them know that it is a courtesy and will not be completed again, so do not send more paperwork.

## 2018-02-01 NOTE — Telephone Encounter (Signed)
Can we refill patient did not keep app.

## 2018-02-01 NOTE — Telephone Encounter (Signed)
Copied from [place]. Topic: Quick Communication - Rx Refill/Question >> Feb 01, 2018  2:54 PM Oneta Rack wrote:  Medication: rizatriptan (MAXALT) 10 MG tablet   Has the patient contacted their pharmacy? Yes   (Agent: If yes, when and what did the pharmacy advise?) to contact PCP office  Preferred Pharmacy (with phone number or street name):  [place], [place] - [address] [phone] (Phone) [phone] (Fax)    Agent: Please be advised that RX refills may take up to 3 business days. We ask that you follow-up with your pharmacy.

## 2018-02-02 DIAGNOSIS — I10 Essential (primary) hypertension: Secondary | ICD-10-CM | POA: Diagnosis not present

## 2018-02-02 DIAGNOSIS — I2510 Atherosclerotic heart disease of native coronary artery without angina pectoris: Secondary | ICD-10-CM | POA: Diagnosis not present

## 2018-02-02 DIAGNOSIS — J9621 Acute and chronic respiratory failure with hypoxia: Secondary | ICD-10-CM | POA: Diagnosis not present

## 2018-02-02 DIAGNOSIS — E1159 Type 2 diabetes mellitus with other circulatory complications: Secondary | ICD-10-CM | POA: Diagnosis not present

## 2018-02-02 DIAGNOSIS — L89612 Pressure ulcer of right heel, stage 2: Secondary | ICD-10-CM | POA: Diagnosis not present

## 2018-02-02 DIAGNOSIS — I5043 Acute on chronic combined systolic (congestive) and diastolic (congestive) heart failure: Secondary | ICD-10-CM | POA: Diagnosis not present

## 2018-02-02 NOTE — Telephone Encounter (Signed)
Copied from [place] [id number]. Topic: Quick Communication - Home Health Verbal Orders >> Feb 02, 2018  3:39 PM Virl A[place] D wrote: Caller/Agency: Clair Gulling, [place] Number: [phone] Secure VM Requesting OT/PT/Skilled Nursing/Social Work: PT Frequency: 2 week 3   Pt does have diminished lung sounds/wheezing / Pt has poorly controlled right knee pain with significant limitation inflection/ Pt is not currently weighing in for her CHF due to limited mobility / Will most likely need an ambulance to get to provider office.

## 2018-02-02 NOTE — Telephone Encounter (Signed)
Please advise 

## 2018-02-03 DIAGNOSIS — I2510 Atherosclerotic heart disease of native coronary artery without angina pectoris: Secondary | ICD-10-CM | POA: Diagnosis not present

## 2018-02-03 DIAGNOSIS — E1159 Type 2 diabetes mellitus with other circulatory complications: Secondary | ICD-10-CM | POA: Diagnosis not present

## 2018-02-03 DIAGNOSIS — I10 Essential (primary) hypertension: Secondary | ICD-10-CM | POA: Diagnosis not present

## 2018-02-03 DIAGNOSIS — J9621 Acute and chronic respiratory failure with hypoxia: Secondary | ICD-10-CM | POA: Diagnosis not present

## 2018-02-03 DIAGNOSIS — L89612 Pressure ulcer of right heel, stage 2: Secondary | ICD-10-CM | POA: Diagnosis not present

## 2018-02-03 DIAGNOSIS — I5043 Acute on chronic combined systolic (congestive) and diastolic (congestive) heart failure: Secondary | ICD-10-CM | POA: Diagnosis not present

## 2018-02-03 NOTE — Telephone Encounter (Signed)
Spoke to [place] and gave verbal orders. Clair Gulling stated that OT was not set up and was wondering if it will still get approved for next week. Also was wondering if Skilled nursing was approved. Clair Gulling was advised that this approval was a courtesy since pt will no longer be under prescriber's care. Clair Gulling verbalized understanding.

## 2018-02-03 NOTE — Telephone Encounter (Signed)
Did you address this with them. See you talked to them in another message that was open on her today.

## 2018-02-03 NOTE — Telephone Encounter (Signed)
Please advise 

## 2018-02-04 ENCOUNTER — Other Ambulatory Visit: Payer: Self-pay | Admitting: Family Medicine

## 2018-02-04 DIAGNOSIS — G894 Chronic pain syndrome: Secondary | ICD-10-CM

## 2018-02-05 NOTE — Telephone Encounter (Signed)
If having such significant issues as documented by [place] below, should consider going back to hospital and SNF (which has been recommended by hospitalists). I cannot treat patient if I cannot see her. Due to multiple no-shows, will be dismissed from [facility]. I will fulfill obligation to patient per [facility] policy for 30 days. Please make sure that patient has been contacted and letter is sent - I have not seen a letter to sign yet.

## 2018-02-06 NOTE — Telephone Encounter (Signed)
Do you know if this has been sent yet?

## 2018-02-06 NOTE — Telephone Encounter (Signed)
I am only able to mail no show letters which have been completed. I am not able to process dismissal letters, Modena Nunnery has to process these once received from the provider. I am forwarding to [place] to assist.

## 2018-02-07 ENCOUNTER — Encounter: Payer: Self-pay | Admitting: Family Medicine

## 2018-02-07 DIAGNOSIS — I10 Essential (primary) hypertension: Secondary | ICD-10-CM | POA: Diagnosis not present

## 2018-02-07 DIAGNOSIS — E1159 Type 2 diabetes mellitus with other circulatory complications: Secondary | ICD-10-CM | POA: Diagnosis not present

## 2018-02-07 DIAGNOSIS — I2510 Atherosclerotic heart disease of native coronary artery without angina pectoris: Secondary | ICD-10-CM | POA: Diagnosis not present

## 2018-02-07 DIAGNOSIS — I5043 Acute on chronic combined systolic (congestive) and diastolic (congestive) heart failure: Secondary | ICD-10-CM | POA: Diagnosis not present

## 2018-02-07 DIAGNOSIS — L89612 Pressure ulcer of right heel, stage 2: Secondary | ICD-10-CM | POA: Diagnosis not present

## 2018-02-07 DIAGNOSIS — J9621 Acute and chronic respiratory failure with hypoxia: Secondary | ICD-10-CM | POA: Diagnosis not present

## 2018-02-07 NOTE — Telephone Encounter (Signed)
Do you know where we are with this and if I need to call patient what do I need to tell them?

## 2018-02-08 DIAGNOSIS — Z8673 Personal history of transient ischemic attack (TIA), and cerebral infarction without residual deficits: Secondary | ICD-10-CM | POA: Diagnosis not present

## 2018-02-08 DIAGNOSIS — Z0000 Encounter for general adult medical examination without abnormal findings: Secondary | ICD-10-CM | POA: Diagnosis not present

## 2018-02-08 DIAGNOSIS — L89899 Pressure ulcer of other site, unspecified stage: Secondary | ICD-10-CM | POA: Diagnosis not present

## 2018-02-08 DIAGNOSIS — I5042 Chronic combined systolic (congestive) and diastolic (congestive) heart failure: Secondary | ICD-10-CM | POA: Diagnosis not present

## 2018-02-08 DIAGNOSIS — E1165 Type 2 diabetes mellitus with hyperglycemia: Secondary | ICD-10-CM | POA: Diagnosis not present

## 2018-02-08 DIAGNOSIS — J9611 Chronic respiratory failure with hypoxia: Secondary | ICD-10-CM | POA: Diagnosis not present

## 2018-02-09 DIAGNOSIS — F419 Anxiety disorder, unspecified: Secondary | ICD-10-CM | POA: Diagnosis not present

## 2018-02-09 DIAGNOSIS — L89612 Pressure ulcer of right heel, stage 2: Secondary | ICD-10-CM | POA: Diagnosis not present

## 2018-02-09 DIAGNOSIS — I5043 Acute on chronic combined systolic (congestive) and diastolic (congestive) heart failure: Secondary | ICD-10-CM | POA: Diagnosis not present

## 2018-02-09 DIAGNOSIS — I509 Heart failure, unspecified: Secondary | ICD-10-CM | POA: Diagnosis not present

## 2018-02-09 DIAGNOSIS — J9621 Acute and chronic respiratory failure with hypoxia: Secondary | ICD-10-CM | POA: Diagnosis not present

## 2018-02-09 DIAGNOSIS — E1159 Type 2 diabetes mellitus with other circulatory complications: Secondary | ICD-10-CM | POA: Diagnosis not present

## 2018-02-09 DIAGNOSIS — I10 Essential (primary) hypertension: Secondary | ICD-10-CM | POA: Diagnosis not present

## 2018-02-09 DIAGNOSIS — I2510 Atherosclerotic heart disease of native coronary artery without angina pectoris: Secondary | ICD-10-CM | POA: Diagnosis not present

## 2018-02-10 NOTE — Telephone Encounter (Signed)
The letter was processed on 02/07/18. We have to provide 30 days of emergency care.

## 2018-02-10 NOTE — Telephone Encounter (Signed)
Per past messages temporary orders have been given.

## 2018-02-13 DIAGNOSIS — I2510 Atherosclerotic heart disease of native coronary artery without angina pectoris: Secondary | ICD-10-CM | POA: Diagnosis not present

## 2018-02-13 DIAGNOSIS — E1159 Type 2 diabetes mellitus with other circulatory complications: Secondary | ICD-10-CM | POA: Diagnosis not present

## 2018-02-13 DIAGNOSIS — I5043 Acute on chronic combined systolic (congestive) and diastolic (congestive) heart failure: Secondary | ICD-10-CM | POA: Diagnosis not present

## 2018-02-13 DIAGNOSIS — J9621 Acute and chronic respiratory failure with hypoxia: Secondary | ICD-10-CM | POA: Diagnosis not present

## 2018-02-13 DIAGNOSIS — L89612 Pressure ulcer of right heel, stage 2: Secondary | ICD-10-CM | POA: Diagnosis not present

## 2018-02-13 DIAGNOSIS — I10 Essential (primary) hypertension: Secondary | ICD-10-CM | POA: Diagnosis not present

## 2018-02-14 DIAGNOSIS — L89612 Pressure ulcer of right heel, stage 2: Secondary | ICD-10-CM | POA: Diagnosis not present

## 2018-02-14 DIAGNOSIS — E1159 Type 2 diabetes mellitus with other circulatory complications: Secondary | ICD-10-CM | POA: Diagnosis not present

## 2018-02-14 DIAGNOSIS — I2510 Atherosclerotic heart disease of native coronary artery without angina pectoris: Secondary | ICD-10-CM | POA: Diagnosis not present

## 2018-02-14 DIAGNOSIS — I5043 Acute on chronic combined systolic (congestive) and diastolic (congestive) heart failure: Secondary | ICD-10-CM | POA: Diagnosis not present

## 2018-02-14 DIAGNOSIS — J9621 Acute and chronic respiratory failure with hypoxia: Secondary | ICD-10-CM | POA: Diagnosis not present

## 2018-02-14 DIAGNOSIS — I10 Essential (primary) hypertension: Secondary | ICD-10-CM | POA: Diagnosis not present

## 2018-02-15 ENCOUNTER — Other Ambulatory Visit: Payer: Self-pay | Admitting: Family Medicine

## 2018-02-15 DIAGNOSIS — G894 Chronic pain syndrome: Secondary | ICD-10-CM

## 2018-02-16 DIAGNOSIS — L89612 Pressure ulcer of right heel, stage 2: Secondary | ICD-10-CM | POA: Diagnosis not present

## 2018-02-16 DIAGNOSIS — I2510 Atherosclerotic heart disease of native coronary artery without angina pectoris: Secondary | ICD-10-CM | POA: Diagnosis not present

## 2018-02-16 DIAGNOSIS — E1159 Type 2 diabetes mellitus with other circulatory complications: Secondary | ICD-10-CM | POA: Diagnosis not present

## 2018-02-16 DIAGNOSIS — I5043 Acute on chronic combined systolic (congestive) and diastolic (congestive) heart failure: Secondary | ICD-10-CM | POA: Diagnosis not present

## 2018-02-16 DIAGNOSIS — I10 Essential (primary) hypertension: Secondary | ICD-10-CM | POA: Diagnosis not present

## 2018-02-16 DIAGNOSIS — J9621 Acute and chronic respiratory failure with hypoxia: Secondary | ICD-10-CM | POA: Diagnosis not present

## 2018-02-16 NOTE — Telephone Encounter (Signed)
Looks like on 02/05/2018 #30 with no rf were called in.

## 2018-02-17 DIAGNOSIS — L89612 Pressure ulcer of right heel, stage 2: Secondary | ICD-10-CM | POA: Diagnosis not present

## 2018-02-17 DIAGNOSIS — I2510 Atherosclerotic heart disease of native coronary artery without angina pectoris: Secondary | ICD-10-CM | POA: Diagnosis not present

## 2018-02-17 DIAGNOSIS — E1159 Type 2 diabetes mellitus with other circulatory complications: Secondary | ICD-10-CM | POA: Diagnosis not present

## 2018-02-17 DIAGNOSIS — J9621 Acute and chronic respiratory failure with hypoxia: Secondary | ICD-10-CM | POA: Diagnosis not present

## 2018-02-17 DIAGNOSIS — I10 Essential (primary) hypertension: Secondary | ICD-10-CM | POA: Diagnosis not present

## 2018-02-17 DIAGNOSIS — I5043 Acute on chronic combined systolic (congestive) and diastolic (congestive) heart failure: Secondary | ICD-10-CM | POA: Diagnosis not present

## 2018-02-19 DIAGNOSIS — J9621 Acute and chronic respiratory failure with hypoxia: Secondary | ICD-10-CM | POA: Diagnosis not present

## 2018-02-19 DIAGNOSIS — I2510 Atherosclerotic heart disease of native coronary artery without angina pectoris: Secondary | ICD-10-CM | POA: Diagnosis not present

## 2018-02-19 DIAGNOSIS — I10 Essential (primary) hypertension: Secondary | ICD-10-CM | POA: Diagnosis not present

## 2018-02-19 DIAGNOSIS — E1159 Type 2 diabetes mellitus with other circulatory complications: Secondary | ICD-10-CM | POA: Diagnosis not present

## 2018-02-19 DIAGNOSIS — L89612 Pressure ulcer of right heel, stage 2: Secondary | ICD-10-CM | POA: Diagnosis not present

## 2018-02-19 DIAGNOSIS — I5043 Acute on chronic combined systolic (congestive) and diastolic (congestive) heart failure: Secondary | ICD-10-CM | POA: Diagnosis not present

## 2018-02-20 DIAGNOSIS — E1159 Type 2 diabetes mellitus with other circulatory complications: Secondary | ICD-10-CM | POA: Diagnosis not present

## 2018-02-20 DIAGNOSIS — I2510 Atherosclerotic heart disease of native coronary artery without angina pectoris: Secondary | ICD-10-CM | POA: Diagnosis not present

## 2018-02-20 DIAGNOSIS — J9621 Acute and chronic respiratory failure with hypoxia: Secondary | ICD-10-CM | POA: Diagnosis not present

## 2018-02-20 DIAGNOSIS — I5043 Acute on chronic combined systolic (congestive) and diastolic (congestive) heart failure: Secondary | ICD-10-CM | POA: Diagnosis not present

## 2018-02-20 DIAGNOSIS — I10 Essential (primary) hypertension: Secondary | ICD-10-CM | POA: Diagnosis not present

## 2018-02-20 DIAGNOSIS — L89612 Pressure ulcer of right heel, stage 2: Secondary | ICD-10-CM | POA: Diagnosis not present

## 2018-02-21 DIAGNOSIS — E1159 Type 2 diabetes mellitus with other circulatory complications: Secondary | ICD-10-CM | POA: Diagnosis not present

## 2018-02-21 DIAGNOSIS — I2510 Atherosclerotic heart disease of native coronary artery without angina pectoris: Secondary | ICD-10-CM | POA: Diagnosis not present

## 2018-02-21 DIAGNOSIS — L89612 Pressure ulcer of right heel, stage 2: Secondary | ICD-10-CM | POA: Diagnosis not present

## 2018-02-21 DIAGNOSIS — J9621 Acute and chronic respiratory failure with hypoxia: Secondary | ICD-10-CM | POA: Diagnosis not present

## 2018-02-21 DIAGNOSIS — I5043 Acute on chronic combined systolic (congestive) and diastolic (congestive) heart failure: Secondary | ICD-10-CM | POA: Diagnosis not present

## 2018-02-21 DIAGNOSIS — I10 Essential (primary) hypertension: Secondary | ICD-10-CM | POA: Diagnosis not present

## 2018-02-23 DIAGNOSIS — I10 Essential (primary) hypertension: Secondary | ICD-10-CM | POA: Diagnosis not present

## 2018-02-23 DIAGNOSIS — J9621 Acute and chronic respiratory failure with hypoxia: Secondary | ICD-10-CM | POA: Diagnosis not present

## 2018-02-23 DIAGNOSIS — I2510 Atherosclerotic heart disease of native coronary artery without angina pectoris: Secondary | ICD-10-CM | POA: Diagnosis not present

## 2018-02-23 DIAGNOSIS — I5043 Acute on chronic combined systolic (congestive) and diastolic (congestive) heart failure: Secondary | ICD-10-CM | POA: Diagnosis not present

## 2018-02-23 DIAGNOSIS — L89612 Pressure ulcer of right heel, stage 2: Secondary | ICD-10-CM | POA: Diagnosis not present

## 2018-02-23 DIAGNOSIS — E1159 Type 2 diabetes mellitus with other circulatory complications: Secondary | ICD-10-CM | POA: Diagnosis not present

## 2018-02-24 DIAGNOSIS — I10 Essential (primary) hypertension: Secondary | ICD-10-CM | POA: Diagnosis not present

## 2018-02-24 DIAGNOSIS — J9621 Acute and chronic respiratory failure with hypoxia: Secondary | ICD-10-CM | POA: Diagnosis not present

## 2018-02-24 DIAGNOSIS — I2510 Atherosclerotic heart disease of native coronary artery without angina pectoris: Secondary | ICD-10-CM | POA: Diagnosis not present

## 2018-02-24 DIAGNOSIS — E1159 Type 2 diabetes mellitus with other circulatory complications: Secondary | ICD-10-CM | POA: Diagnosis not present

## 2018-02-24 DIAGNOSIS — I5043 Acute on chronic combined systolic (congestive) and diastolic (congestive) heart failure: Secondary | ICD-10-CM | POA: Diagnosis not present

## 2018-02-24 DIAGNOSIS — L89612 Pressure ulcer of right heel, stage 2: Secondary | ICD-10-CM | POA: Diagnosis not present

## 2018-02-27 DIAGNOSIS — I2510 Atherosclerotic heart disease of native coronary artery without angina pectoris: Secondary | ICD-10-CM | POA: Diagnosis not present

## 2018-02-27 DIAGNOSIS — I10 Essential (primary) hypertension: Secondary | ICD-10-CM | POA: Diagnosis not present

## 2018-02-27 DIAGNOSIS — I5043 Acute on chronic combined systolic (congestive) and diastolic (congestive) heart failure: Secondary | ICD-10-CM | POA: Diagnosis not present

## 2018-02-27 DIAGNOSIS — E1159 Type 2 diabetes mellitus with other circulatory complications: Secondary | ICD-10-CM | POA: Diagnosis not present

## 2018-02-27 DIAGNOSIS — L89612 Pressure ulcer of right heel, stage 2: Secondary | ICD-10-CM | POA: Diagnosis not present

## 2018-02-27 DIAGNOSIS — J9621 Acute and chronic respiratory failure with hypoxia: Secondary | ICD-10-CM | POA: Diagnosis not present

## 2018-02-28 DIAGNOSIS — I5042 Chronic combined systolic (congestive) and diastolic (congestive) heart failure: Secondary | ICD-10-CM | POA: Diagnosis not present

## 2018-02-28 DIAGNOSIS — I5043 Acute on chronic combined systolic (congestive) and diastolic (congestive) heart failure: Secondary | ICD-10-CM | POA: Diagnosis not present

## 2018-02-28 DIAGNOSIS — I10 Essential (primary) hypertension: Secondary | ICD-10-CM | POA: Diagnosis not present

## 2018-02-28 DIAGNOSIS — I2510 Atherosclerotic heart disease of native coronary artery without angina pectoris: Secondary | ICD-10-CM | POA: Diagnosis not present

## 2018-02-28 DIAGNOSIS — E1159 Type 2 diabetes mellitus with other circulatory complications: Secondary | ICD-10-CM | POA: Diagnosis not present

## 2018-02-28 DIAGNOSIS — L89612 Pressure ulcer of right heel, stage 2: Secondary | ICD-10-CM | POA: Diagnosis not present

## 2018-02-28 DIAGNOSIS — J9621 Acute and chronic respiratory failure with hypoxia: Secondary | ICD-10-CM | POA: Diagnosis not present

## 2018-03-01 DIAGNOSIS — J9621 Acute and chronic respiratory failure with hypoxia: Secondary | ICD-10-CM | POA: Diagnosis not present

## 2018-03-01 DIAGNOSIS — L89612 Pressure ulcer of right heel, stage 2: Secondary | ICD-10-CM | POA: Diagnosis not present

## 2018-03-01 DIAGNOSIS — Z9981 Dependence on supplemental oxygen: Secondary | ICD-10-CM | POA: Diagnosis not present

## 2018-03-01 DIAGNOSIS — Z4800 Encounter for change or removal of nonsurgical wound dressing: Secondary | ICD-10-CM | POA: Diagnosis not present

## 2018-03-01 DIAGNOSIS — E1122 Type 2 diabetes mellitus with diabetic chronic kidney disease: Secondary | ICD-10-CM | POA: Diagnosis not present

## 2018-03-01 DIAGNOSIS — Z79899 Other long term (current) drug therapy: Secondary | ICD-10-CM | POA: Diagnosis not present

## 2018-03-01 DIAGNOSIS — Z7902 Long term (current) use of antithrombotics/antiplatelets: Secondary | ICD-10-CM | POA: Diagnosis not present

## 2018-03-01 DIAGNOSIS — I10 Essential (primary) hypertension: Secondary | ICD-10-CM | POA: Diagnosis not present

## 2018-03-01 DIAGNOSIS — Z794 Long term (current) use of insulin: Secondary | ICD-10-CM | POA: Diagnosis not present

## 2018-03-01 DIAGNOSIS — I5043 Acute on chronic combined systolic (congestive) and diastolic (congestive) heart failure: Secondary | ICD-10-CM | POA: Diagnosis not present

## 2018-03-01 DIAGNOSIS — E1159 Type 2 diabetes mellitus with other circulatory complications: Secondary | ICD-10-CM | POA: Diagnosis not present

## 2018-03-01 DIAGNOSIS — N183 Chronic kidney disease, stage 3 (moderate): Secondary | ICD-10-CM | POA: Diagnosis not present

## 2018-03-01 DIAGNOSIS — I2510 Atherosclerotic heart disease of native coronary artery without angina pectoris: Secondary | ICD-10-CM | POA: Diagnosis not present

## 2018-03-01 DIAGNOSIS — Z6841 Body Mass Index (BMI) 40.0 and over, adult: Secondary | ICD-10-CM | POA: Diagnosis not present

## 2018-03-02 DIAGNOSIS — E1159 Type 2 diabetes mellitus with other circulatory complications: Secondary | ICD-10-CM | POA: Diagnosis not present

## 2018-03-02 DIAGNOSIS — L89612 Pressure ulcer of right heel, stage 2: Secondary | ICD-10-CM | POA: Diagnosis not present

## 2018-03-02 DIAGNOSIS — I10 Essential (primary) hypertension: Secondary | ICD-10-CM | POA: Diagnosis not present

## 2018-03-02 DIAGNOSIS — J9621 Acute and chronic respiratory failure with hypoxia: Secondary | ICD-10-CM | POA: Diagnosis not present

## 2018-03-02 DIAGNOSIS — I5043 Acute on chronic combined systolic (congestive) and diastolic (congestive) heart failure: Secondary | ICD-10-CM | POA: Diagnosis not present

## 2018-03-02 DIAGNOSIS — I2510 Atherosclerotic heart disease of native coronary artery without angina pectoris: Secondary | ICD-10-CM | POA: Diagnosis not present

## 2018-03-03 DIAGNOSIS — J9621 Acute and chronic respiratory failure with hypoxia: Secondary | ICD-10-CM | POA: Diagnosis not present

## 2018-03-03 DIAGNOSIS — E1159 Type 2 diabetes mellitus with other circulatory complications: Secondary | ICD-10-CM | POA: Diagnosis not present

## 2018-03-03 DIAGNOSIS — I5043 Acute on chronic combined systolic (congestive) and diastolic (congestive) heart failure: Secondary | ICD-10-CM | POA: Diagnosis not present

## 2018-03-03 DIAGNOSIS — I2510 Atherosclerotic heart disease of native coronary artery without angina pectoris: Secondary | ICD-10-CM | POA: Diagnosis not present

## 2018-03-03 DIAGNOSIS — L89612 Pressure ulcer of right heel, stage 2: Secondary | ICD-10-CM | POA: Diagnosis not present

## 2018-03-03 DIAGNOSIS — I10 Essential (primary) hypertension: Secondary | ICD-10-CM | POA: Diagnosis not present

## 2018-03-04 ENCOUNTER — Other Ambulatory Visit: Payer: Self-pay | Admitting: Family Medicine

## 2018-03-04 DIAGNOSIS — I5042 Chronic combined systolic (congestive) and diastolic (congestive) heart failure: Secondary | ICD-10-CM | POA: Diagnosis not present

## 2018-03-04 DIAGNOSIS — E1165 Type 2 diabetes mellitus with hyperglycemia: Secondary | ICD-10-CM | POA: Diagnosis not present

## 2018-03-04 DIAGNOSIS — R52 Pain, unspecified: Secondary | ICD-10-CM | POA: Diagnosis not present

## 2018-03-04 DIAGNOSIS — G43009 Migraine without aura, not intractable, without status migrainosus: Secondary | ICD-10-CM

## 2018-03-04 DIAGNOSIS — J9611 Chronic respiratory failure with hypoxia: Secondary | ICD-10-CM | POA: Diagnosis not present

## 2018-03-06 DIAGNOSIS — I5043 Acute on chronic combined systolic (congestive) and diastolic (congestive) heart failure: Secondary | ICD-10-CM | POA: Diagnosis not present

## 2018-03-06 DIAGNOSIS — L89612 Pressure ulcer of right heel, stage 2: Secondary | ICD-10-CM | POA: Diagnosis not present

## 2018-03-06 DIAGNOSIS — I2510 Atherosclerotic heart disease of native coronary artery without angina pectoris: Secondary | ICD-10-CM | POA: Diagnosis not present

## 2018-03-06 DIAGNOSIS — J9621 Acute and chronic respiratory failure with hypoxia: Secondary | ICD-10-CM | POA: Diagnosis not present

## 2018-03-06 DIAGNOSIS — E1159 Type 2 diabetes mellitus with other circulatory complications: Secondary | ICD-10-CM | POA: Diagnosis not present

## 2018-03-06 DIAGNOSIS — I10 Essential (primary) hypertension: Secondary | ICD-10-CM | POA: Diagnosis not present

## 2018-03-07 DIAGNOSIS — J9621 Acute and chronic respiratory failure with hypoxia: Secondary | ICD-10-CM | POA: Diagnosis not present

## 2018-03-07 DIAGNOSIS — L89612 Pressure ulcer of right heel, stage 2: Secondary | ICD-10-CM | POA: Diagnosis not present

## 2018-03-07 DIAGNOSIS — I2510 Atherosclerotic heart disease of native coronary artery without angina pectoris: Secondary | ICD-10-CM | POA: Diagnosis not present

## 2018-03-07 DIAGNOSIS — I5043 Acute on chronic combined systolic (congestive) and diastolic (congestive) heart failure: Secondary | ICD-10-CM | POA: Diagnosis not present

## 2018-03-07 DIAGNOSIS — I10 Essential (primary) hypertension: Secondary | ICD-10-CM | POA: Diagnosis not present

## 2018-03-07 DIAGNOSIS — E1159 Type 2 diabetes mellitus with other circulatory complications: Secondary | ICD-10-CM | POA: Diagnosis not present

## 2018-03-09 DIAGNOSIS — I2510 Atherosclerotic heart disease of native coronary artery without angina pectoris: Secondary | ICD-10-CM | POA: Diagnosis not present

## 2018-03-09 DIAGNOSIS — I10 Essential (primary) hypertension: Secondary | ICD-10-CM | POA: Diagnosis not present

## 2018-03-09 DIAGNOSIS — J9621 Acute and chronic respiratory failure with hypoxia: Secondary | ICD-10-CM | POA: Diagnosis not present

## 2018-03-09 DIAGNOSIS — L89612 Pressure ulcer of right heel, stage 2: Secondary | ICD-10-CM | POA: Diagnosis not present

## 2018-03-09 DIAGNOSIS — E1159 Type 2 diabetes mellitus with other circulatory complications: Secondary | ICD-10-CM | POA: Diagnosis not present

## 2018-03-09 DIAGNOSIS — I5043 Acute on chronic combined systolic (congestive) and diastolic (congestive) heart failure: Secondary | ICD-10-CM | POA: Diagnosis not present

## 2018-03-10 DIAGNOSIS — I5043 Acute on chronic combined systolic (congestive) and diastolic (congestive) heart failure: Secondary | ICD-10-CM | POA: Diagnosis not present

## 2018-03-10 DIAGNOSIS — I2510 Atherosclerotic heart disease of native coronary artery without angina pectoris: Secondary | ICD-10-CM | POA: Diagnosis not present

## 2018-03-10 DIAGNOSIS — I10 Essential (primary) hypertension: Secondary | ICD-10-CM | POA: Diagnosis not present

## 2018-03-10 DIAGNOSIS — E1159 Type 2 diabetes mellitus with other circulatory complications: Secondary | ICD-10-CM | POA: Diagnosis not present

## 2018-03-10 DIAGNOSIS — L89612 Pressure ulcer of right heel, stage 2: Secondary | ICD-10-CM | POA: Diagnosis not present

## 2018-03-10 DIAGNOSIS — J9621 Acute and chronic respiratory failure with hypoxia: Secondary | ICD-10-CM | POA: Diagnosis not present

## 2018-03-13 DIAGNOSIS — E1159 Type 2 diabetes mellitus with other circulatory complications: Secondary | ICD-10-CM | POA: Diagnosis not present

## 2018-03-13 DIAGNOSIS — I5043 Acute on chronic combined systolic (congestive) and diastolic (congestive) heart failure: Secondary | ICD-10-CM | POA: Diagnosis not present

## 2018-03-13 DIAGNOSIS — J9621 Acute and chronic respiratory failure with hypoxia: Secondary | ICD-10-CM | POA: Diagnosis not present

## 2018-03-13 DIAGNOSIS — I10 Essential (primary) hypertension: Secondary | ICD-10-CM | POA: Diagnosis not present

## 2018-03-13 DIAGNOSIS — I2510 Atherosclerotic heart disease of native coronary artery without angina pectoris: Secondary | ICD-10-CM | POA: Diagnosis not present

## 2018-03-13 DIAGNOSIS — L89612 Pressure ulcer of right heel, stage 2: Secondary | ICD-10-CM | POA: Diagnosis not present

## 2018-03-17 ENCOUNTER — Other Ambulatory Visit: Payer: Self-pay | Admitting: Family Medicine

## 2018-03-17 DIAGNOSIS — F419 Anxiety disorder, unspecified: Secondary | ICD-10-CM

## 2018-03-20 ENCOUNTER — Other Ambulatory Visit: Payer: Self-pay | Admitting: Family Medicine

## 2018-03-20 DIAGNOSIS — F419 Anxiety disorder, unspecified: Secondary | ICD-10-CM

## 2018-03-21 ENCOUNTER — Other Ambulatory Visit: Payer: Self-pay | Admitting: Family Medicine

## 2018-03-21 DIAGNOSIS — F419 Anxiety disorder, unspecified: Secondary | ICD-10-CM

## 2018-03-22 DIAGNOSIS — E1159 Type 2 diabetes mellitus with other circulatory complications: Secondary | ICD-10-CM | POA: Diagnosis not present

## 2018-03-22 DIAGNOSIS — I5043 Acute on chronic combined systolic (congestive) and diastolic (congestive) heart failure: Secondary | ICD-10-CM | POA: Diagnosis not present

## 2018-03-22 DIAGNOSIS — I2510 Atherosclerotic heart disease of native coronary artery without angina pectoris: Secondary | ICD-10-CM | POA: Diagnosis not present

## 2018-03-22 DIAGNOSIS — L89612 Pressure ulcer of right heel, stage 2: Secondary | ICD-10-CM | POA: Diagnosis not present

## 2018-03-22 DIAGNOSIS — J9621 Acute and chronic respiratory failure with hypoxia: Secondary | ICD-10-CM | POA: Diagnosis not present

## 2018-03-22 DIAGNOSIS — I10 Essential (primary) hypertension: Secondary | ICD-10-CM | POA: Diagnosis not present

## 2018-03-28 DIAGNOSIS — I5043 Acute on chronic combined systolic (congestive) and diastolic (congestive) heart failure: Secondary | ICD-10-CM | POA: Diagnosis not present

## 2018-03-28 DIAGNOSIS — J9621 Acute and chronic respiratory failure with hypoxia: Secondary | ICD-10-CM | POA: Diagnosis not present

## 2018-03-28 DIAGNOSIS — E1159 Type 2 diabetes mellitus with other circulatory complications: Secondary | ICD-10-CM | POA: Diagnosis not present

## 2018-03-28 DIAGNOSIS — I10 Essential (primary) hypertension: Secondary | ICD-10-CM | POA: Diagnosis not present

## 2018-03-28 DIAGNOSIS — I2510 Atherosclerotic heart disease of native coronary artery without angina pectoris: Secondary | ICD-10-CM | POA: Diagnosis not present

## 2018-03-28 DIAGNOSIS — L89612 Pressure ulcer of right heel, stage 2: Secondary | ICD-10-CM | POA: Diagnosis not present

## 2018-04-01 DIAGNOSIS — J209 Acute bronchitis, unspecified: Secondary | ICD-10-CM | POA: Diagnosis not present

## 2018-04-01 DIAGNOSIS — G894 Chronic pain syndrome: Secondary | ICD-10-CM | POA: Diagnosis not present

## 2018-04-01 DIAGNOSIS — I5042 Chronic combined systolic (congestive) and diastolic (congestive) heart failure: Secondary | ICD-10-CM | POA: Diagnosis not present

## 2018-04-01 DIAGNOSIS — J9611 Chronic respiratory failure with hypoxia: Secondary | ICD-10-CM | POA: Diagnosis not present

## 2018-04-02 ENCOUNTER — Other Ambulatory Visit: Payer: Self-pay | Admitting: Family Medicine

## 2018-04-12 DIAGNOSIS — E1165 Type 2 diabetes mellitus with hyperglycemia: Secondary | ICD-10-CM | POA: Diagnosis not present

## 2018-04-12 DIAGNOSIS — J9611 Chronic respiratory failure with hypoxia: Secondary | ICD-10-CM | POA: Diagnosis not present

## 2018-04-12 DIAGNOSIS — Z7982 Long term (current) use of aspirin: Secondary | ICD-10-CM | POA: Diagnosis not present

## 2018-04-12 DIAGNOSIS — N390 Urinary tract infection, site not specified: Secondary | ICD-10-CM | POA: Diagnosis not present

## 2018-04-29 ENCOUNTER — Other Ambulatory Visit: Payer: Self-pay | Admitting: Family Medicine

## 2018-04-29 DIAGNOSIS — E1142 Type 2 diabetes mellitus with diabetic polyneuropathy: Secondary | ICD-10-CM

## 2018-05-09 DIAGNOSIS — Z7902 Long term (current) use of antithrombotics/antiplatelets: Secondary | ICD-10-CM | POA: Diagnosis not present

## 2018-05-09 DIAGNOSIS — L299 Pruritus, unspecified: Secondary | ICD-10-CM | POA: Diagnosis not present

## 2018-05-09 DIAGNOSIS — F419 Anxiety disorder, unspecified: Secondary | ICD-10-CM | POA: Diagnosis not present

## 2018-05-09 DIAGNOSIS — Z7189 Other specified counseling: Secondary | ICD-10-CM | POA: Diagnosis not present

## 2018-05-09 DIAGNOSIS — J9611 Chronic respiratory failure with hypoxia: Secondary | ICD-10-CM | POA: Diagnosis not present

## 2018-05-09 DIAGNOSIS — G894 Chronic pain syndrome: Secondary | ICD-10-CM | POA: Diagnosis not present

## 2018-05-09 DIAGNOSIS — I509 Heart failure, unspecified: Secondary | ICD-10-CM | POA: Diagnosis not present

## 2018-05-14 ENCOUNTER — Other Ambulatory Visit: Payer: Self-pay | Admitting: Family Medicine

## 2018-05-14 DIAGNOSIS — I5033 Acute on chronic diastolic (congestive) heart failure: Secondary | ICD-10-CM

## 2018-05-20 ENCOUNTER — Other Ambulatory Visit: Payer: Self-pay | Admitting: Family Medicine

## 2018-05-20 DIAGNOSIS — E1159 Type 2 diabetes mellitus with other circulatory complications: Secondary | ICD-10-CM

## 2018-05-25 ENCOUNTER — Other Ambulatory Visit: Payer: Self-pay | Admitting: Family Medicine

## 2018-05-25 DIAGNOSIS — I5033 Acute on chronic diastolic (congestive) heart failure: Secondary | ICD-10-CM

## 2018-05-29 DIAGNOSIS — I110 Hypertensive heart disease with heart failure: Secondary | ICD-10-CM | POA: Diagnosis not present

## 2018-05-31 ENCOUNTER — Other Ambulatory Visit: Payer: Self-pay

## 2018-06-06 DIAGNOSIS — J9611 Chronic respiratory failure with hypoxia: Secondary | ICD-10-CM | POA: Diagnosis not present

## 2018-06-06 DIAGNOSIS — E1165 Type 2 diabetes mellitus with hyperglycemia: Secondary | ICD-10-CM | POA: Diagnosis not present

## 2018-06-06 DIAGNOSIS — L299 Pruritus, unspecified: Secondary | ICD-10-CM | POA: Diagnosis not present

## 2018-06-06 DIAGNOSIS — I5042 Chronic combined systolic (congestive) and diastolic (congestive) heart failure: Secondary | ICD-10-CM | POA: Diagnosis not present

## 2018-06-06 DIAGNOSIS — G894 Chronic pain syndrome: Secondary | ICD-10-CM | POA: Diagnosis not present

## 2018-06-07 DIAGNOSIS — G43909 Migraine, unspecified, not intractable, without status migrainosus: Secondary | ICD-10-CM | POA: Diagnosis not present

## 2018-06-07 DIAGNOSIS — Z8673 Personal history of transient ischemic attack (TIA), and cerebral infarction without residual deficits: Secondary | ICD-10-CM | POA: Diagnosis not present

## 2018-06-07 DIAGNOSIS — J9611 Chronic respiratory failure with hypoxia: Secondary | ICD-10-CM | POA: Diagnosis not present

## 2018-06-07 DIAGNOSIS — I252 Old myocardial infarction: Secondary | ICD-10-CM | POA: Diagnosis not present

## 2018-06-07 DIAGNOSIS — Z7902 Long term (current) use of antithrombotics/antiplatelets: Secondary | ICD-10-CM | POA: Diagnosis not present

## 2018-06-07 DIAGNOSIS — E1122 Type 2 diabetes mellitus with diabetic chronic kidney disease: Secondary | ICD-10-CM | POA: Diagnosis not present

## 2018-06-07 DIAGNOSIS — I130 Hypertensive heart and chronic kidney disease with heart failure and stage 1 through stage 4 chronic kidney disease, or unspecified chronic kidney disease: Secondary | ICD-10-CM | POA: Diagnosis not present

## 2018-06-07 DIAGNOSIS — Z6841 Body Mass Index (BMI) 40.0 and over, adult: Secondary | ICD-10-CM | POA: Diagnosis not present

## 2018-06-07 DIAGNOSIS — F419 Anxiety disorder, unspecified: Secondary | ICD-10-CM | POA: Diagnosis not present

## 2018-06-07 DIAGNOSIS — Z90710 Acquired absence of both cervix and uterus: Secondary | ICD-10-CM | POA: Diagnosis not present

## 2018-06-07 DIAGNOSIS — Z794 Long term (current) use of insulin: Secondary | ICD-10-CM | POA: Diagnosis not present

## 2018-06-07 DIAGNOSIS — D631 Anemia in chronic kidney disease: Secondary | ICD-10-CM | POA: Diagnosis not present

## 2018-06-07 DIAGNOSIS — Z9981 Dependence on supplemental oxygen: Secondary | ICD-10-CM | POA: Diagnosis not present

## 2018-06-07 DIAGNOSIS — Z79899 Other long term (current) drug therapy: Secondary | ICD-10-CM | POA: Diagnosis not present

## 2018-06-07 DIAGNOSIS — G40909 Epilepsy, unspecified, not intractable, without status epilepticus: Secondary | ICD-10-CM | POA: Diagnosis not present

## 2018-06-07 DIAGNOSIS — I2510 Atherosclerotic heart disease of native coronary artery without angina pectoris: Secondary | ICD-10-CM | POA: Diagnosis not present

## 2018-06-07 DIAGNOSIS — I350 Nonrheumatic aortic (valve) stenosis: Secondary | ICD-10-CM | POA: Diagnosis not present

## 2018-06-07 DIAGNOSIS — G894 Chronic pain syndrome: Secondary | ICD-10-CM | POA: Diagnosis not present

## 2018-06-07 DIAGNOSIS — L299 Pruritus, unspecified: Secondary | ICD-10-CM | POA: Diagnosis not present

## 2018-06-07 DIAGNOSIS — I5042 Chronic combined systolic (congestive) and diastolic (congestive) heart failure: Secondary | ICD-10-CM | POA: Diagnosis not present

## 2018-06-07 DIAGNOSIS — Z79891 Long term (current) use of opiate analgesic: Secondary | ICD-10-CM | POA: Diagnosis not present

## 2018-06-07 DIAGNOSIS — E782 Mixed hyperlipidemia: Secondary | ICD-10-CM | POA: Diagnosis not present

## 2018-06-07 DIAGNOSIS — N183 Chronic kidney disease, stage 3 (moderate): Secondary | ICD-10-CM | POA: Diagnosis not present

## 2018-06-07 DIAGNOSIS — F329 Major depressive disorder, single episode, unspecified: Secondary | ICD-10-CM | POA: Diagnosis not present

## 2018-06-08 DIAGNOSIS — I5042 Chronic combined systolic (congestive) and diastolic (congestive) heart failure: Secondary | ICD-10-CM | POA: Diagnosis not present

## 2018-06-08 DIAGNOSIS — I130 Hypertensive heart and chronic kidney disease with heart failure and stage 1 through stage 4 chronic kidney disease, or unspecified chronic kidney disease: Secondary | ICD-10-CM | POA: Diagnosis not present

## 2018-06-08 DIAGNOSIS — I2510 Atherosclerotic heart disease of native coronary artery without angina pectoris: Secondary | ICD-10-CM | POA: Diagnosis not present

## 2018-06-08 DIAGNOSIS — E1122 Type 2 diabetes mellitus with diabetic chronic kidney disease: Secondary | ICD-10-CM | POA: Diagnosis not present

## 2018-06-08 DIAGNOSIS — N183 Chronic kidney disease, stage 3 (moderate): Secondary | ICD-10-CM | POA: Diagnosis not present

## 2018-06-08 DIAGNOSIS — D631 Anemia in chronic kidney disease: Secondary | ICD-10-CM | POA: Diagnosis not present

## 2018-06-09 DIAGNOSIS — I2510 Atherosclerotic heart disease of native coronary artery without angina pectoris: Secondary | ICD-10-CM | POA: Diagnosis not present

## 2018-06-09 DIAGNOSIS — I5042 Chronic combined systolic (congestive) and diastolic (congestive) heart failure: Secondary | ICD-10-CM | POA: Diagnosis not present

## 2018-06-09 DIAGNOSIS — D631 Anemia in chronic kidney disease: Secondary | ICD-10-CM | POA: Diagnosis not present

## 2018-06-09 DIAGNOSIS — E1122 Type 2 diabetes mellitus with diabetic chronic kidney disease: Secondary | ICD-10-CM | POA: Diagnosis not present

## 2018-06-09 DIAGNOSIS — N183 Chronic kidney disease, stage 3 (moderate): Secondary | ICD-10-CM | POA: Diagnosis not present

## 2018-06-09 DIAGNOSIS — I130 Hypertensive heart and chronic kidney disease with heart failure and stage 1 through stage 4 chronic kidney disease, or unspecified chronic kidney disease: Secondary | ICD-10-CM | POA: Diagnosis not present

## 2018-06-12 DIAGNOSIS — N183 Chronic kidney disease, stage 3 (moderate): Secondary | ICD-10-CM | POA: Diagnosis not present

## 2018-06-12 DIAGNOSIS — E1122 Type 2 diabetes mellitus with diabetic chronic kidney disease: Secondary | ICD-10-CM | POA: Diagnosis not present

## 2018-06-12 DIAGNOSIS — I2510 Atherosclerotic heart disease of native coronary artery without angina pectoris: Secondary | ICD-10-CM | POA: Diagnosis not present

## 2018-06-12 DIAGNOSIS — D631 Anemia in chronic kidney disease: Secondary | ICD-10-CM | POA: Diagnosis not present

## 2018-06-12 DIAGNOSIS — I130 Hypertensive heart and chronic kidney disease with heart failure and stage 1 through stage 4 chronic kidney disease, or unspecified chronic kidney disease: Secondary | ICD-10-CM | POA: Diagnosis not present

## 2018-06-12 DIAGNOSIS — I5042 Chronic combined systolic (congestive) and diastolic (congestive) heart failure: Secondary | ICD-10-CM | POA: Diagnosis not present

## 2018-06-13 DIAGNOSIS — I5042 Chronic combined systolic (congestive) and diastolic (congestive) heart failure: Secondary | ICD-10-CM | POA: Diagnosis not present

## 2018-06-13 DIAGNOSIS — I130 Hypertensive heart and chronic kidney disease with heart failure and stage 1 through stage 4 chronic kidney disease, or unspecified chronic kidney disease: Secondary | ICD-10-CM | POA: Diagnosis not present

## 2018-06-13 DIAGNOSIS — R079 Chest pain, unspecified: Secondary | ICD-10-CM | POA: Diagnosis not present

## 2018-06-13 DIAGNOSIS — N183 Chronic kidney disease, stage 3 (moderate): Secondary | ICD-10-CM | POA: Diagnosis not present

## 2018-06-13 DIAGNOSIS — I2510 Atherosclerotic heart disease of native coronary artery without angina pectoris: Secondary | ICD-10-CM | POA: Diagnosis not present

## 2018-06-13 DIAGNOSIS — E1122 Type 2 diabetes mellitus with diabetic chronic kidney disease: Secondary | ICD-10-CM | POA: Diagnosis not present

## 2018-06-13 DIAGNOSIS — I959 Hypotension, unspecified: Secondary | ICD-10-CM | POA: Diagnosis not present

## 2018-06-13 DIAGNOSIS — D631 Anemia in chronic kidney disease: Secondary | ICD-10-CM | POA: Diagnosis not present

## 2018-06-19 DIAGNOSIS — D631 Anemia in chronic kidney disease: Secondary | ICD-10-CM | POA: Diagnosis not present

## 2018-06-19 DIAGNOSIS — E1122 Type 2 diabetes mellitus with diabetic chronic kidney disease: Secondary | ICD-10-CM | POA: Diagnosis not present

## 2018-06-19 DIAGNOSIS — I2510 Atherosclerotic heart disease of native coronary artery without angina pectoris: Secondary | ICD-10-CM | POA: Diagnosis not present

## 2018-06-19 DIAGNOSIS — I130 Hypertensive heart and chronic kidney disease with heart failure and stage 1 through stage 4 chronic kidney disease, or unspecified chronic kidney disease: Secondary | ICD-10-CM | POA: Diagnosis not present

## 2018-06-19 DIAGNOSIS — N183 Chronic kidney disease, stage 3 (moderate): Secondary | ICD-10-CM | POA: Diagnosis not present

## 2018-06-19 DIAGNOSIS — I5042 Chronic combined systolic (congestive) and diastolic (congestive) heart failure: Secondary | ICD-10-CM | POA: Diagnosis not present

## 2018-06-21 ENCOUNTER — Other Ambulatory Visit: Payer: Self-pay

## 2018-06-21 ENCOUNTER — Encounter (HOSPITAL_COMMUNITY): Payer: Self-pay | Admitting: Emergency Medicine

## 2018-06-21 ENCOUNTER — Emergency Department (HOSPITAL_COMMUNITY)
Admission: EM | Admit: 2018-06-21 | Discharge: 2018-06-21 | Disposition: A | Discharge status: Home / Self Care | Payer: Medicare Other | Attending: Emergency Medicine | Admitting: Emergency Medicine

## 2018-06-21 DIAGNOSIS — S71001A Unspecified open wound, right hip, initial encounter: Secondary | ICD-10-CM | POA: Diagnosis not present

## 2018-06-21 DIAGNOSIS — E875 Hyperkalemia: Secondary | ICD-10-CM

## 2018-06-21 DIAGNOSIS — G40909 Epilepsy, unspecified, not intractable, without status epilepticus: Secondary | ICD-10-CM | POA: Insufficient documentation

## 2018-06-21 DIAGNOSIS — Z794 Long term (current) use of insulin: Secondary | ICD-10-CM | POA: Insufficient documentation

## 2018-06-21 DIAGNOSIS — Y999 Unspecified external cause status: Secondary | ICD-10-CM | POA: Diagnosis not present

## 2018-06-21 DIAGNOSIS — R404 Transient alteration of awareness: Secondary | ICD-10-CM | POA: Diagnosis not present

## 2018-06-21 DIAGNOSIS — Z79899 Other long term (current) drug therapy: Secondary | ICD-10-CM | POA: Insufficient documentation

## 2018-06-21 DIAGNOSIS — I5042 Chronic combined systolic (congestive) and diastolic (congestive) heart failure: Secondary | ICD-10-CM | POA: Diagnosis not present

## 2018-06-21 DIAGNOSIS — N183 Chronic kidney disease, stage 3 (moderate): Secondary | ICD-10-CM | POA: Insufficient documentation

## 2018-06-21 DIAGNOSIS — E1159 Type 2 diabetes mellitus with other circulatory complications: Secondary | ICD-10-CM | POA: Diagnosis not present

## 2018-06-21 DIAGNOSIS — E039 Hypothyroidism, unspecified: Secondary | ICD-10-CM | POA: Diagnosis not present

## 2018-06-21 DIAGNOSIS — I130 Hypertensive heart and chronic kidney disease with heart failure and stage 1 through stage 4 chronic kidney disease, or unspecified chronic kidney disease: Secondary | ICD-10-CM | POA: Insufficient documentation

## 2018-06-21 DIAGNOSIS — Z87891 Personal history of nicotine dependence: Secondary | ICD-10-CM | POA: Insufficient documentation

## 2018-06-21 DIAGNOSIS — S71012A Laceration without foreign body, left hip, initial encounter: Secondary | ICD-10-CM | POA: Diagnosis not present

## 2018-06-21 DIAGNOSIS — Y939 Activity, unspecified: Secondary | ICD-10-CM | POA: Insufficient documentation

## 2018-06-21 DIAGNOSIS — E1122 Type 2 diabetes mellitus with diabetic chronic kidney disease: Secondary | ICD-10-CM | POA: Diagnosis not present

## 2018-06-21 DIAGNOSIS — S71011A Laceration without foreign body, right hip, initial encounter: Secondary | ICD-10-CM | POA: Diagnosis not present

## 2018-06-21 DIAGNOSIS — Y929 Unspecified place or not applicable: Secondary | ICD-10-CM | POA: Insufficient documentation

## 2018-06-21 DIAGNOSIS — I2510 Atherosclerotic heart disease of native coronary artery without angina pectoris: Secondary | ICD-10-CM | POA: Insufficient documentation

## 2018-06-21 DIAGNOSIS — I252 Old myocardial infarction: Secondary | ICD-10-CM | POA: Insufficient documentation

## 2018-06-21 DIAGNOSIS — X58XXXA Exposure to other specified factors, initial encounter: Secondary | ICD-10-CM | POA: Diagnosis not present

## 2018-06-21 DIAGNOSIS — I447 Left bundle-branch block, unspecified: Secondary | ICD-10-CM | POA: Diagnosis not present

## 2018-06-21 DIAGNOSIS — Z7982 Long term (current) use of aspirin: Secondary | ICD-10-CM | POA: Insufficient documentation

## 2018-06-21 DIAGNOSIS — R0902 Hypoxemia: Secondary | ICD-10-CM | POA: Diagnosis not present

## 2018-06-21 DIAGNOSIS — Z7401 Bed confinement status: Secondary | ICD-10-CM | POA: Diagnosis not present

## 2018-06-21 DIAGNOSIS — R58 Hemorrhage, not elsewhere classified: Secondary | ICD-10-CM | POA: Diagnosis not present

## 2018-06-21 DIAGNOSIS — M2550 Pain in unspecified joint: Secondary | ICD-10-CM | POA: Diagnosis not present

## 2018-06-21 LAB — CBC WITH DIFFERENTIAL/PLATELET
Abs Immature Granulocytes: 0.01 10*3/uL (ref 0.00–0.07)
Basophils Absolute: 0 10*3/uL (ref 0.0–0.1)
Basophils Relative: 1 %
Eosinophils Absolute: 0.3 10*3/uL (ref 0.0–0.5)
Eosinophils Relative: 5 %
HCT: 31 % — ABNORMAL LOW (ref 36.0–46.0)
Hemoglobin: 9.4 g/dL — ABNORMAL LOW (ref 12.0–15.0)
Immature Granulocytes: 0 %
Lymphocytes Relative: 30 %
Lymphs Abs: 1.9 10*3/uL (ref 0.7–4.0)
MCH: 31.3 pg (ref 26.0–34.0)
MCHC: 30.3 g/dL (ref 30.0–36.0)
MCV: 103.3 fL — ABNORMAL HIGH (ref 80.0–100.0)
Monocytes Absolute: 0.5 10*3/uL (ref 0.1–1.0)
Monocytes Relative: 8 %
Neutro Abs: 3.6 10*3/uL (ref 1.7–7.7)
Neutrophils Relative %: 56 %
Platelets: 197 10*3/uL (ref 150–400)
RBC: 3 MIL/uL — ABNORMAL LOW (ref 3.87–5.11)
RDW: 14.8 % (ref 11.5–15.5)
WBC: 6.3 10*3/uL (ref 4.0–10.5)
nRBC: 0 % (ref 0.0–0.2)

## 2018-06-21 LAB — BASIC METABOLIC PANEL
Anion gap: 9 (ref 5–15)
Anion gap: 10 (ref 5–15)
BUN: 46 mg/dL — ABNORMAL HIGH (ref 8–23)
BUN: 47 mg/dL — ABNORMAL HIGH (ref 8–23)
CO2: 25 mmol/L (ref 22–32)
CO2: 23 mmol/L (ref 22–32)
Calcium: 9 mg/dL (ref 8.9–10.3)
Calcium: 8.7 mg/dL — ABNORMAL LOW (ref 8.9–10.3)
Chloride: 106 mmol/L (ref 98–111)
Chloride: 104 mmol/L (ref 98–111)
Creatinine, Ser: 1.52 mg/dL — ABNORMAL HIGH (ref 0.44–1.00)
Creatinine, Ser: 1.61 mg/dL — ABNORMAL HIGH (ref 0.44–1.00)
GFR calc Af Amer: 39 mL/min — ABNORMAL LOW (ref >60)
GFR calc Af Amer: 36 mL/min — ABNORMAL LOW (ref >60)
GFR calc non Af Amer: 34 mL/min — ABNORMAL LOW (ref >60)
GFR calc non Af Amer: 31 mL/min — ABNORMAL LOW (ref >60)
Glucose, Bld: 129 mg/dL — ABNORMAL HIGH (ref 70–99)
Glucose, Bld: 112 mg/dL — ABNORMAL HIGH (ref 70–99)
Potassium: 5.5 mmol/L — ABNORMAL HIGH (ref 3.5–5.1)
Potassium: 4.9 mmol/L (ref 3.5–5.1)
Sodium: 140 mmol/L (ref 135–145)
Sodium: 137 mmol/L (ref 135–145)

## 2018-06-21 LAB — POTASSIUM: Potassium: 5.7 mmol/L — ABNORMAL HIGH (ref 3.5–5.1)

## 2018-06-21 LAB — PROTIME-INR
INR: 1.2 (ref 0.8–1.2)
Prothrombin Time: 14.8 seconds (ref 11.4–15.2)

## 2018-06-21 MED ORDER — SODIUM ZIRCONIUM CYCLOSILICATE 10 G PO PACK
10.0000 g | PACK | Freq: Once | ORAL | Status: AC
  Administered 2018-06-21: 10 g via ORAL
  Filled 2018-06-21: qty 1

## 2018-06-21 MED ORDER — LIDOCAINE-EPINEPHRINE (PF) 2 %-1:200000 IJ SOLN
10.0000 mL | Freq: Once | INTRAMUSCULAR | Status: AC
  Administered 2018-06-21: 10 mL
  Filled 2018-06-21: qty 20

## 2018-06-21 MED ORDER — TRANEXAMIC ACID 1000 MG/10ML IV SOLN
500.0000 mg | Freq: Once | INTRAVENOUS | Status: AC
  Administered 2018-06-21: 500 mg via TOPICAL
  Filled 2018-06-21 (×2): qty 10

## 2018-06-21 NOTE — ED Notes (Signed)
[facility] contacted for transport home.  

## 2018-06-21 NOTE — Discharge Instructions (Addendum)
Follow-up with your doctor for suture removal in 1 week.  Return to the ED with difficulty breathing, chest pain, any other concerns.  Do not take your potassium for 2 days.

## 2018-06-21 NOTE — ED Provider Notes (Signed)
Pt signed out by Dr. Wyvonnia Dusky pending repeat potassium.  It is now in the normal range.  Pt is told to hold home potassium for 2 days.  Return if worse.  F/u with pcp.   Isla Pence, MD 06/21/18 [phone]

## 2018-06-21 NOTE — ED Provider Notes (Signed)
[facility] EMERGENCY DEPARTMENT Provider Note   CSN: [id number] Arrival date & time: 06/21/18  0347     History   Chief Complaint Chief Complaint  Patient presents with  . Abrasion    HPI [name] is a 73 y.o. female.     73 y.o. female with medical history significant for coronary artery disease, insulin-dependent diabetes mellitus, depression with anxiety, chronic kidney disease stage III, chronic anemia, hypertension, chronic 2 L/min supplemental oxygen requirement, and seizure disorder presenting from home with bleeding wound to her right hip.  It is unknown how this wound occurred but is believe the patient may have scratched herself.  She denies any falls.  It has apparently been bleeding on and off since 10 PM.  Her caregiver reports that she has not had been able to control the bleeding at home.  EMS put her in a pressure dressing.  There is been no chest pain, shortness of breath, dizziness, lightheadedness.  Patient states she feels well otherwise.  Discussed with patient's caregiver Abigail by phone.  States she noticed a wound on the patient's leg last night since about 10 PM has had a change of dressing multiple times.  She does not know how the wound occurred but believes the patient may have scratched herself.  There is been no fall.  Patient has been otherwise acting normally without any chest pain or shortness of breath.  The history is provided by the patient and the EMS personnel.    Past Medical History:  Diagnosis Date  . Anxiety   . Coronary artery disease   . Depression   . Diabetes mellitus    insulin dependent  . Edema of lower extremity   . Esophageal stricture 2008   EGD  . Fatigue   . GERD (gastroesophageal reflux disease)   . Heart murmur   . Hiatal hernia 2008   EGD  . Hyperlipidemia   . Hypertension   . Knee pain   . Morbid obesity ([place])   . Nonproductive cough    chronic  . Orthopnea   . Seizure disorder ([place])    . SOB (shortness of breath)   . Syncope and collapse   . Tremor     Patient Active Problem List   Diagnosis Date Noted  . Goals of care, counseling/discussion   . Palliative care by specialist   . NSTEMI (non-ST elevated myocardial infarction) ([place]) 01/22/2018  . Insulin-requiring or dependent type II diabetes mellitus ([place]) 01/22/2018  . Chronic respiratory failure with hypoxia ([place]) 01/22/2018  . Pressure injury of skin 01/22/2018  . Acute respiratory failure with hypoxia ([place])   . Anxiety 11/02/2017  . Pressure injury of skin of right heel 11/02/2017  . Long term (current) use of aspirin 09/12/2017  . Mixed hyperlipidemia 09/12/2017  . CKD (chronic kidney disease), stage III ([place]) 09/12/2017  . Impaired mobility and ADLs 08/29/2017  . Cerebral thrombosis with cerebral infarction 07/27/2017  . Anemia of chronic disease 07/22/2017  . Congestive heart failure ([place]) 06/20/2017  . LV dysfunction 10/06/2016  . 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 Acute on chronic combined systolic and diastolic CHF (congestive heart failure) ([place]) 09/09/2016  . Migraine 08/20/2016  . Morbid obesity ([place]) 08/20/2016  . DOE (dyspnea on exertion), on O2 10/02/2015  . Hyperlipidemia associated with type 2 diabetes mellitus ([place]), on Crestor 10/02/2015  . Nonrheumatic aortic valve stenosis 10/02/2015  . Old MI (myocardial infarction)  10/02/2015  . Primary insomnia 11/03/2014  . Weakness generalized 08/28/2014  . Seizure disorder ([place]) 08/28/2014  . Coronary artery disease of native artery of native heart with stable angina pectoris ([place]) 06/02/2010  . Depression, recurrent ([place]), on Zoloft 05/20/2007  . Hypertension associated with diabetes ([place]) 05/20/2007  . Diaphragmatic hernia 05/20/2007  . Diverticulosis of colon 05/20/2007  . IBS 05/20/2007  . Fatty liver 05/20/2007  . Hypothyroidism 05/20/2007  . Hx of transient ischemic attack  (TIA) 05/20/2007    Past Surgical History:  Procedure Laterality Date  . ABDOMINAL HYSTERECTOMY    . CARDIAC CATHETERIZATION  03/28/2009  . KNEE ARTHROSCOPY    . TRANSTHORACIC ECHOCARDIOGRAM     showed ef of 65% with no regional wall motion abnormalities     OB History   No obstetric history on file.      Home Medications    Prior to Admission medications   Medication Sig Start Date End Date Taking? Authorizing Provider  acetaminophen (TYLENOL) 500 MG tablet Take 1,000 mg by mouth 2 (two) times daily. For leg pain    [provider]  albuterol (PROVENTIL HFA;VENTOLIN HFA) 108 (90 Base) MCG/ACT inhaler Inhale 2 puffs into the lungs every 6 (six) hours as needed for wheezing or shortness of breath. Patient not taking: Reported on 01/22/2018 10/31/17   Briscoe Deutscher, DO  ALPRAZolam Duanne Moron) 0.5 MG tablet Take 1 tablet (0.5 mg total) by mouth at bedtime. 10/31/17   Briscoe Deutscher, DO  aspirin 81 MG chewable tablet Chew 1 tablet (81 mg total) by mouth daily. 01/30/18   Mariel Aloe, MD  clopidogrel (PLAVIX) 75 MG tablet TAKE ONE TABLET BY MOUTH DAILY Patient taking differently: Take 75 mg by mouth at bedtime.  11/08/17   Briscoe Deutscher, DO  collagenase (SANTYL) ointment Apply 1 application topically daily. Patient taking differently: Apply 1 application topically daily. Apply to feet 10/14/17   Briscoe Deutscher, DO  feeding supplement, ENSURE ENLIVE, (ENSURE ENLIVE) LIQD Take 237 mLs by mouth 2 (two) times daily between meals. 01/29/18   Mariel Aloe, MD  gabapentin (NEURONTIN) 300 MG capsule TAKE ONE CAPSULE BY MOUTH AT BEDTIME Patient taking differently: Take 300 mg by mouth at bedtime.  01/16/18   Briscoe Deutscher, DO  glucose blood (ONETOUCH VERIO) test strip 1 each by Other route daily. And lancets 1/day. Dx code E11.9. 08/26/17   Briscoe Deutscher, DO  insulin detemir (LEVEMIR) 100 unit/ml SOLN Inject 15 Units into the skin at bedtime.    [provider]  isosorbide  mononitrate (IMDUR) 60 MG 24 hr tablet Take 1 tablet (60 mg total) by mouth daily. 10/04/17   Briscoe Deutscher, DO  levETIRAcetam (KEPPRA) 1000 MG tablet TAKE 1 TABLET BY MOUTH TWICE DAILY Patient taking differently: Take 1,000 mg by mouth 2 (two) times daily.  12/13/17   Briscoe Deutscher, DO  losartan (COZAAR) 25 MG tablet TAKE ONE TABLET BY MOUTH DAILY Patient taking differently: Take 25 mg by mouth at bedtime.  11/14/17   Briscoe Deutscher, DO  Melatonin 3 MG TABS Take 1 tablet (3 mg total) by mouth at bedtime. 08/26/17   Briscoe Deutscher, DO  metoprolol tartrate (LOPRESSOR) 50 MG tablet TAKE 1 TABLET BY MOUTH ONCE DAILY Patient taking differently: Take 50 mg by mouth daily.  12/13/17   Briscoe Deutscher, DO  ondansetron (ZOFRAN) 4 MG tablet Take 1 tablet (4 mg total) by mouth every 6 (six) hours as needed for nausea or vomiting. 08/26/17   Briscoe Deutscher, DO  oxymetazoline (AFRIN) 0.05 % nasal spray Place 1 spray into both nostrils 2 (two) times daily as needed (nosebleeds). 08/26/17   Briscoe Deutscher, DO  polyethylene glycol (MIRALAX / GLYCOLAX) packet Take 17 g by mouth daily as needed for mild constipation. Patient not taking: Reported on 01/22/2018 08/26/17   Briscoe Deutscher, DO  potassium chloride (K-DUR) 10 MEQ tablet TAKE ONE TABLET BY MOUTH DAILY Patient taking differently: Take 10 mEq by mouth daily.  12/14/17   Briscoe Deutscher, DO  rizatriptan (MAXALT) 10 MG tablet Take 1 tablet (10 mg total) by mouth daily as needed for migraine. 02/02/18   Briscoe Deutscher, DO  rizatriptan (MAXALT) 10 MG tablet TAKE 1 TABLET BY MOUTH DAILY AS NEEDED FOR CHRONIC  PAIN 02/01/18   Briscoe Deutscher, DO  rosuvastatin (CRESTOR) 20 MG tablet Take 1 tablet (20 mg total) by mouth every evening. Patient taking differently: Take 20 mg by mouth at bedtime.  10/04/17   Briscoe Deutscher, DO  sertraline (ZOLOFT) 50 MG tablet Take 1 tablet (50 mg total) by mouth at bedtime. 10/31/17   Briscoe Deutscher, DO  sodium chloride (OCEAN) 0.65 % SOLN  nasal spray Place 2 sprays into both nostrils 3 (three) times daily. Patient taking differently: Place 2 sprays into both nostrils 3 (three) times daily as needed for congestion.  08/26/17   Briscoe Deutscher, DO  torsemide (DEMADEX) 10 MG tablet Take 3 tablets (30 mg total) by mouth daily. 01/30/18   Mariel Aloe, MD  traMADol (ULTRAM) 50 MG tablet Take 1 tablet (50 mg total) by mouth every 6 (six) hours as needed (pain). 02/05/18   Briscoe Deutscher, DO    Family History Family History  Problem Relation Age of Onset  . Heart attack Mother   . Hypertension Mother   . COPD Father   . Heart disease Sister   . Diabetes Brother   . Diabetes Brother   . Prostate cancer Brother   . Kidney disease Brother     Social History Social History   Tobacco Use  . Smoking status: Former Smoker    Quit date: 06/02/1970    Years since quitting: 48.0  . Smokeless tobacco: Never Used  Substance Use Topics  . Alcohol use: No  . Drug use: No     Allergies   Codeine   Review of Systems Review of Systems  Constitutional: Positive for fatigue. Negative for activity change, appetite change and fever.  HENT: Negative for congestion and rhinorrhea.   Eyes: Negative for visual disturbance.  Respiratory: Negative for choking, chest tightness and shortness of breath.   Cardiovascular: Negative for chest pain.  Gastrointestinal: Negative for abdominal pain, nausea and vomiting.  Genitourinary: Negative for dysuria and hematuria.  Musculoskeletal: Negative for arthralgias and myalgias.  Skin: Positive for wound.  Neurological: Negative for dizziness, weakness and headaches.    all other systems are negative except as noted in the HPI and PMH.    Physical Exam Updated Vital Signs BP (!) 117/59   Pulse 60   Temp 98.9 F (37.2 C) (Oral)   Ht 4\' 11"  (1.499 m)   Wt 100.3 kg   SpO2 100%   BMI 44.66 kg/m   Physical Exam Vitals signs and nursing note reviewed.  Constitutional:      General: She  is not in acute distress.    Appearance: She is well-developed. She is obese.  HENT:     Head: Normocephalic and atraumatic.     Mouth/Throat:     Pharynx:  No oropharyngeal exudate.  Eyes:     Conjunctiva/sclera: Conjunctivae normal.     Pupils: Pupils are equal, round, and reactive to light.  Neck:     Musculoskeletal: Normal range of motion and neck supple.     Comments: No meningismus. Cardiovascular:     Rate and Rhythm: Normal rate and regular rhythm.     Heart sounds: Murmur present.     Comments: Holosystolic murmur throughout precordium Pulmonary:     Effort: Pulmonary effort is normal. No respiratory distress.     Breath sounds: Normal breath sounds.  Abdominal:     Palpations: Abdomen is soft.     Tenderness: There is no abdominal tenderness. There is no guarding or rebound.  Musculoskeletal: Normal range of motion.        General: No tenderness, deformity or signs of injury.  Skin:    General: Skin is warm.     Comments: Wound to right lateral hip approximately 1 cm avulsion with active bleeding, nonpulsatile  Neurological:     Mental Status: She is alert and oriented to person, place, and time.     Cranial Nerves: No cranial nerve deficit.     Motor: No abnormal muscle tone.     Coordination: Coordination normal.     Comments:  5/5 strength throughout. CN 2-12 intact.Equal grip strength.   Psychiatric:        Behavior: Behavior normal.      ED Treatments / Results  Labs (all labs ordered are listed, but only abnormal results are displayed) Labs Reviewed  CBC WITH DIFFERENTIAL/PLATELET - Abnormal; Notable for the following components:      Result Value   RBC 3.00 (*)    Hemoglobin 9.4 (*)    HCT 31.0 (*)    MCV 103.3 (*)    All other components within normal limits  BASIC METABOLIC PANEL - Abnormal; Notable for the following components:   Potassium 5.5 (*)    Glucose, Bld 129 (*)    BUN 46 (*)    Creatinine, Ser 1.52 (*)    GFR calc non Af Amer 34 (*)     GFR calc Af Amer 39 (*)    All other components within normal limits  POTASSIUM - Abnormal; Notable for the following components:   Potassium 5.7 (*)    All other components within normal limits  BASIC METABOLIC PANEL - Abnormal; Notable for the following components:   Glucose, Bld 112 (*)    BUN 47 (*)    Creatinine, Ser 1.61 (*)    Calcium 8.7 (*)    GFR calc non Af Amer 31 (*)    GFR calc Af Amer 36 (*)    All other components within normal limits  PROTIME-INR    EKG EKG Interpretation  Date/Time:  Wednesday June 21 2018 05:52:40 EDT Ventricular Rate:  60 PR Interval:    QRS Duration: 114 QT Interval:  435 QTC Calculation: 435 R Axis:   39 Text Interpretation:  Sinus rhythm Incomplete left bundle branch block Low voltage, precordial leads Abnormal inferior Q waves No significant change was found Confirmed by Ezequiel Essex [phone]) on 06/21/2018 5:57:00 AM Also confirmed by Ezequiel Essex [phone]), editor Philomena Doheny [phone])  on 06/21/2018 7:23:01 AM   Radiology No results found.  Procedures .Marland KitchenLaceration Repair  Date/Time: 06/21/2018 4:13 AM Performed by: Ezequiel Essex, MD Authorized by: Ezequiel Essex, MD   Consent:    Consent obtained:  Emergent situation and verbal   Consent given by:  Patient   Risks discussed:  Infection, pain, need for additional repair, nerve damage, poor wound healing and poor cosmetic result   Alternatives discussed:  No treatment Anesthesia (see MAR for exact dosages):    Anesthesia method:  Local infiltration   Local anesthetic:  Lidocaine 1% WITH epi Laceration details:    Location:  Leg   Leg location:  R hip   Length (cm):  1 Repair type:    Repair type:  Simple Pre-procedure details:    Preparation:  Patient was prepped and draped in usual sterile fashion and imaging obtained to evaluate for foreign bodies Exploration:    Hemostasis achieved with:  Epinephrine and direct pressure   Wound exploration: wound explored  through full range of motion and entire depth of wound probed and visualized     Contaminated: no   Treatment:    Area cleansed with:  Betadine   Amount of cleaning:  Standard   Irrigation solution:  Sterile saline   Irrigation method:  Syringe   Visualized foreign bodies/material removed: no   Skin repair:    Repair method:  Sutures   Suture size:  5-0   Suture material:  Prolene   Suture technique:  Figure eight   Number of sutures:  2 Approximation:    Approximation:  Close Post-procedure details:    Dressing:  Antibiotic ointment and adhesive bandage   Patient tolerance of procedure:  Tolerated well, no immediate complications   (including critical care time)  Medications Ordered in ED Medications  lidocaine-EPINEPHrine (XYLOCAINE W/EPI) 2 %-1:200000 (PF) injection 10 mL (10 mLs Infiltration Given 06/21/18 0357)  tranexamic acid (CYKLOKAPRON) injection 500 mg (500 mg Topical Given 06/21/18 0446)  sodium zirconium cyclosilicate (LOKELMA) packet 10 g (10 g Oral Given 06/21/18 0609)     Initial Impression / Assessment and Plan / ED Course  I have reviewed the triage vital signs and the nursing notes.  Pertinent labs & imaging results that were available during my care of the patient were reviewed by me and considered in my medical decision making (see chart for details).       Bleeding wounds to right hip.  Hemodynamics are stable.  No dizziness or lightheadedness.  No chest pain or shortness of breath  Wound repaired with sutures and TXA with resolution of bleeding.  Tetanus UTD.  Hemoglobin improved from baseline. Cr at baseline. Mild hyperkalemia noted.  Present on Recheck was well. No EKG changes. Lokelma given. Does take Kcl supplements at home which she is instructed to hold for 2 days.  Followup with PCP for suture removal in 1 week and recheck of K. Continues to deny CP, SOB, dizziness and feels well.  Anticipate discharge home if potassium improving on recheck.  Dr. Gilford Raid to assume care at shift change. Final Clinical Impressions(s) / ED Diagnoses   Final diagnoses:  Bleeding from right hip wound, initial encounter  Hyperkalemia    ED Discharge Orders    None       Roberto Romanoski, Annie Main, MD 06/21/18 [phone]

## 2018-06-21 NOTE — ED Triage Notes (Signed)
Pt presents to ED from home by [facility]. C/o hemorrhage began at 2200. Care giver reported pt being more pale and called 911. Chronic AMS. Takes apirin daily. Wound on Right thigh about 1cm.

## 2018-06-21 NOTE — ED Notes (Signed)
Patient verbalizes understanding of discharge instructions. Opportunity for questioning and answers were provided. Armband removed by staff, pt discharged from ED home via POV.  

## 2018-06-22 DIAGNOSIS — N183 Chronic kidney disease, stage 3 (moderate): Secondary | ICD-10-CM | POA: Diagnosis not present

## 2018-06-22 DIAGNOSIS — M25561 Pain in right knee: Secondary | ICD-10-CM | POA: Diagnosis not present

## 2018-06-22 DIAGNOSIS — M79671 Pain in right foot: Secondary | ICD-10-CM | POA: Diagnosis not present

## 2018-06-22 DIAGNOSIS — I2510 Atherosclerotic heart disease of native coronary artery without angina pectoris: Secondary | ICD-10-CM | POA: Diagnosis not present

## 2018-06-22 DIAGNOSIS — E1122 Type 2 diabetes mellitus with diabetic chronic kidney disease: Secondary | ICD-10-CM | POA: Diagnosis not present

## 2018-06-22 DIAGNOSIS — G8100 Flaccid hemiplegia affecting unspecified side: Secondary | ICD-10-CM | POA: Diagnosis not present

## 2018-06-22 DIAGNOSIS — D631 Anemia in chronic kidney disease: Secondary | ICD-10-CM | POA: Diagnosis not present

## 2018-06-22 DIAGNOSIS — M25551 Pain in right hip: Secondary | ICD-10-CM | POA: Diagnosis not present

## 2018-06-22 DIAGNOSIS — I130 Hypertensive heart and chronic kidney disease with heart failure and stage 1 through stage 4 chronic kidney disease, or unspecified chronic kidney disease: Secondary | ICD-10-CM | POA: Diagnosis not present

## 2018-06-22 DIAGNOSIS — G8929 Other chronic pain: Secondary | ICD-10-CM | POA: Diagnosis not present

## 2018-06-22 DIAGNOSIS — I5042 Chronic combined systolic (congestive) and diastolic (congestive) heart failure: Secondary | ICD-10-CM | POA: Diagnosis not present

## 2018-06-22 DIAGNOSIS — Z6830 Body mass index (BMI) 30.0-30.9, adult: Secondary | ICD-10-CM | POA: Diagnosis not present

## 2018-06-27 DIAGNOSIS — M2011 Hallux valgus (acquired), right foot: Secondary | ICD-10-CM | POA: Diagnosis not present

## 2018-06-27 DIAGNOSIS — M79675 Pain in left toe(s): Secondary | ICD-10-CM | POA: Diagnosis not present

## 2018-06-27 DIAGNOSIS — D631 Anemia in chronic kidney disease: Secondary | ICD-10-CM | POA: Diagnosis not present

## 2018-06-27 DIAGNOSIS — I130 Hypertensive heart and chronic kidney disease with heart failure and stage 1 through stage 4 chronic kidney disease, or unspecified chronic kidney disease: Secondary | ICD-10-CM | POA: Diagnosis not present

## 2018-06-27 DIAGNOSIS — E1151 Type 2 diabetes mellitus with diabetic peripheral angiopathy without gangrene: Secondary | ICD-10-CM | POA: Diagnosis not present

## 2018-06-27 DIAGNOSIS — I2510 Atherosclerotic heart disease of native coronary artery without angina pectoris: Secondary | ICD-10-CM | POA: Diagnosis not present

## 2018-06-27 DIAGNOSIS — N183 Chronic kidney disease, stage 3 (moderate): Secondary | ICD-10-CM | POA: Diagnosis not present

## 2018-06-27 DIAGNOSIS — B351 Tinea unguium: Secondary | ICD-10-CM | POA: Diagnosis not present

## 2018-06-27 DIAGNOSIS — I5042 Chronic combined systolic (congestive) and diastolic (congestive) heart failure: Secondary | ICD-10-CM | POA: Diagnosis not present

## 2018-06-27 DIAGNOSIS — E1122 Type 2 diabetes mellitus with diabetic chronic kidney disease: Secondary | ICD-10-CM | POA: Diagnosis not present

## 2018-06-27 DIAGNOSIS — M2041 Other hammer toe(s) (acquired), right foot: Secondary | ICD-10-CM | POA: Diagnosis not present

## 2018-06-29 DIAGNOSIS — I5042 Chronic combined systolic (congestive) and diastolic (congestive) heart failure: Secondary | ICD-10-CM | POA: Diagnosis not present

## 2018-06-29 DIAGNOSIS — I130 Hypertensive heart and chronic kidney disease with heart failure and stage 1 through stage 4 chronic kidney disease, or unspecified chronic kidney disease: Secondary | ICD-10-CM | POA: Diagnosis not present

## 2018-06-29 DIAGNOSIS — E1122 Type 2 diabetes mellitus with diabetic chronic kidney disease: Secondary | ICD-10-CM | POA: Diagnosis not present

## 2018-06-29 DIAGNOSIS — I2510 Atherosclerotic heart disease of native coronary artery without angina pectoris: Secondary | ICD-10-CM | POA: Diagnosis not present

## 2018-06-29 DIAGNOSIS — N183 Chronic kidney disease, stage 3 (moderate): Secondary | ICD-10-CM | POA: Diagnosis not present

## 2018-06-29 DIAGNOSIS — I509 Heart failure, unspecified: Secondary | ICD-10-CM | POA: Diagnosis not present

## 2018-06-29 DIAGNOSIS — D631 Anemia in chronic kidney disease: Secondary | ICD-10-CM | POA: Diagnosis not present

## 2018-07-03 DIAGNOSIS — E1122 Type 2 diabetes mellitus with diabetic chronic kidney disease: Secondary | ICD-10-CM | POA: Diagnosis not present

## 2018-07-03 DIAGNOSIS — I2510 Atherosclerotic heart disease of native coronary artery without angina pectoris: Secondary | ICD-10-CM | POA: Diagnosis not present

## 2018-07-03 DIAGNOSIS — D631 Anemia in chronic kidney disease: Secondary | ICD-10-CM | POA: Diagnosis not present

## 2018-07-03 DIAGNOSIS — N183 Chronic kidney disease, stage 3 (moderate): Secondary | ICD-10-CM | POA: Diagnosis not present

## 2018-07-03 DIAGNOSIS — I5042 Chronic combined systolic (congestive) and diastolic (congestive) heart failure: Secondary | ICD-10-CM | POA: Diagnosis not present

## 2018-07-03 DIAGNOSIS — I130 Hypertensive heart and chronic kidney disease with heart failure and stage 1 through stage 4 chronic kidney disease, or unspecified chronic kidney disease: Secondary | ICD-10-CM | POA: Diagnosis not present

## 2018-07-04 DIAGNOSIS — E1122 Type 2 diabetes mellitus with diabetic chronic kidney disease: Secondary | ICD-10-CM | POA: Diagnosis not present

## 2018-07-04 DIAGNOSIS — B029 Zoster without complications: Secondary | ICD-10-CM | POA: Diagnosis not present

## 2018-07-04 DIAGNOSIS — J9611 Chronic respiratory failure with hypoxia: Secondary | ICD-10-CM | POA: Diagnosis not present

## 2018-07-04 DIAGNOSIS — E1165 Type 2 diabetes mellitus with hyperglycemia: Secondary | ICD-10-CM | POA: Diagnosis not present

## 2018-07-04 DIAGNOSIS — D631 Anemia in chronic kidney disease: Secondary | ICD-10-CM | POA: Diagnosis not present

## 2018-07-04 DIAGNOSIS — N183 Chronic kidney disease, stage 3 (moderate): Secondary | ICD-10-CM | POA: Diagnosis not present

## 2018-07-04 DIAGNOSIS — I130 Hypertensive heart and chronic kidney disease with heart failure and stage 1 through stage 4 chronic kidney disease, or unspecified chronic kidney disease: Secondary | ICD-10-CM | POA: Diagnosis not present

## 2018-07-04 DIAGNOSIS — I5042 Chronic combined systolic (congestive) and diastolic (congestive) heart failure: Secondary | ICD-10-CM | POA: Diagnosis not present

## 2018-07-04 DIAGNOSIS — I2510 Atherosclerotic heart disease of native coronary artery without angina pectoris: Secondary | ICD-10-CM | POA: Diagnosis not present

## 2018-07-04 DIAGNOSIS — G894 Chronic pain syndrome: Secondary | ICD-10-CM | POA: Diagnosis not present

## 2018-07-07 DIAGNOSIS — L299 Pruritus, unspecified: Secondary | ICD-10-CM | POA: Diagnosis not present

## 2018-07-07 DIAGNOSIS — I350 Nonrheumatic aortic (valve) stenosis: Secondary | ICD-10-CM | POA: Diagnosis not present

## 2018-07-07 DIAGNOSIS — Z79899 Other long term (current) drug therapy: Secondary | ICD-10-CM | POA: Diagnosis not present

## 2018-07-07 DIAGNOSIS — E1122 Type 2 diabetes mellitus with diabetic chronic kidney disease: Secondary | ICD-10-CM | POA: Diagnosis not present

## 2018-07-07 DIAGNOSIS — I252 Old myocardial infarction: Secondary | ICD-10-CM | POA: Diagnosis not present

## 2018-07-07 DIAGNOSIS — Z8673 Personal history of transient ischemic attack (TIA), and cerebral infarction without residual deficits: Secondary | ICD-10-CM | POA: Diagnosis not present

## 2018-07-07 DIAGNOSIS — N183 Chronic kidney disease, stage 3 (moderate): Secondary | ICD-10-CM | POA: Diagnosis not present

## 2018-07-07 DIAGNOSIS — I5042 Chronic combined systolic (congestive) and diastolic (congestive) heart failure: Secondary | ICD-10-CM | POA: Diagnosis not present

## 2018-07-07 DIAGNOSIS — Z9981 Dependence on supplemental oxygen: Secondary | ICD-10-CM | POA: Diagnosis not present

## 2018-07-07 DIAGNOSIS — G894 Chronic pain syndrome: Secondary | ICD-10-CM | POA: Diagnosis not present

## 2018-07-07 DIAGNOSIS — I2510 Atherosclerotic heart disease of native coronary artery without angina pectoris: Secondary | ICD-10-CM | POA: Diagnosis not present

## 2018-07-07 DIAGNOSIS — Z6841 Body Mass Index (BMI) 40.0 and over, adult: Secondary | ICD-10-CM | POA: Diagnosis not present

## 2018-07-07 DIAGNOSIS — E782 Mixed hyperlipidemia: Secondary | ICD-10-CM | POA: Diagnosis not present

## 2018-07-07 DIAGNOSIS — F419 Anxiety disorder, unspecified: Secondary | ICD-10-CM | POA: Diagnosis not present

## 2018-07-07 DIAGNOSIS — J9611 Chronic respiratory failure with hypoxia: Secondary | ICD-10-CM | POA: Diagnosis not present

## 2018-07-07 DIAGNOSIS — D631 Anemia in chronic kidney disease: Secondary | ICD-10-CM | POA: Diagnosis not present

## 2018-07-07 DIAGNOSIS — G40909 Epilepsy, unspecified, not intractable, without status epilepticus: Secondary | ICD-10-CM | POA: Diagnosis not present

## 2018-07-07 DIAGNOSIS — I130 Hypertensive heart and chronic kidney disease with heart failure and stage 1 through stage 4 chronic kidney disease, or unspecified chronic kidney disease: Secondary | ICD-10-CM | POA: Diagnosis not present

## 2018-07-07 DIAGNOSIS — Z90710 Acquired absence of both cervix and uterus: Secondary | ICD-10-CM | POA: Diagnosis not present

## 2018-07-07 DIAGNOSIS — F329 Major depressive disorder, single episode, unspecified: Secondary | ICD-10-CM | POA: Diagnosis not present

## 2018-07-07 DIAGNOSIS — G43909 Migraine, unspecified, not intractable, without status migrainosus: Secondary | ICD-10-CM | POA: Diagnosis not present

## 2018-07-07 DIAGNOSIS — Z7902 Long term (current) use of antithrombotics/antiplatelets: Secondary | ICD-10-CM | POA: Diagnosis not present

## 2018-07-07 DIAGNOSIS — Z794 Long term (current) use of insulin: Secondary | ICD-10-CM | POA: Diagnosis not present

## 2018-07-07 DIAGNOSIS — Z79891 Long term (current) use of opiate analgesic: Secondary | ICD-10-CM | POA: Diagnosis not present

## 2018-07-10 DIAGNOSIS — I2510 Atherosclerotic heart disease of native coronary artery without angina pectoris: Secondary | ICD-10-CM | POA: Diagnosis not present

## 2018-07-10 DIAGNOSIS — E1122 Type 2 diabetes mellitus with diabetic chronic kidney disease: Secondary | ICD-10-CM | POA: Diagnosis not present

## 2018-07-10 DIAGNOSIS — N183 Chronic kidney disease, stage 3 (moderate): Secondary | ICD-10-CM | POA: Diagnosis not present

## 2018-07-10 DIAGNOSIS — D631 Anemia in chronic kidney disease: Secondary | ICD-10-CM | POA: Diagnosis not present

## 2018-07-10 DIAGNOSIS — I5042 Chronic combined systolic (congestive) and diastolic (congestive) heart failure: Secondary | ICD-10-CM | POA: Diagnosis not present

## 2018-07-10 DIAGNOSIS — I130 Hypertensive heart and chronic kidney disease with heart failure and stage 1 through stage 4 chronic kidney disease, or unspecified chronic kidney disease: Secondary | ICD-10-CM | POA: Diagnosis not present

## 2018-07-12 DIAGNOSIS — I5042 Chronic combined systolic (congestive) and diastolic (congestive) heart failure: Secondary | ICD-10-CM | POA: Diagnosis not present

## 2018-07-12 DIAGNOSIS — N183 Chronic kidney disease, stage 3 (moderate): Secondary | ICD-10-CM | POA: Diagnosis not present

## 2018-07-12 DIAGNOSIS — D631 Anemia in chronic kidney disease: Secondary | ICD-10-CM | POA: Diagnosis not present

## 2018-07-12 DIAGNOSIS — E1122 Type 2 diabetes mellitus with diabetic chronic kidney disease: Secondary | ICD-10-CM | POA: Diagnosis not present

## 2018-07-12 DIAGNOSIS — I2510 Atherosclerotic heart disease of native coronary artery without angina pectoris: Secondary | ICD-10-CM | POA: Diagnosis not present

## 2018-07-12 DIAGNOSIS — I130 Hypertensive heart and chronic kidney disease with heart failure and stage 1 through stage 4 chronic kidney disease, or unspecified chronic kidney disease: Secondary | ICD-10-CM | POA: Diagnosis not present

## 2018-07-17 DIAGNOSIS — N183 Chronic kidney disease, stage 3 (moderate): Secondary | ICD-10-CM | POA: Diagnosis not present

## 2018-07-17 DIAGNOSIS — I5042 Chronic combined systolic (congestive) and diastolic (congestive) heart failure: Secondary | ICD-10-CM | POA: Diagnosis not present

## 2018-07-17 DIAGNOSIS — E1122 Type 2 diabetes mellitus with diabetic chronic kidney disease: Secondary | ICD-10-CM | POA: Diagnosis not present

## 2018-07-17 DIAGNOSIS — D631 Anemia in chronic kidney disease: Secondary | ICD-10-CM | POA: Diagnosis not present

## 2018-07-17 DIAGNOSIS — I2510 Atherosclerotic heart disease of native coronary artery without angina pectoris: Secondary | ICD-10-CM | POA: Diagnosis not present

## 2018-07-17 DIAGNOSIS — I130 Hypertensive heart and chronic kidney disease with heart failure and stage 1 through stage 4 chronic kidney disease, or unspecified chronic kidney disease: Secondary | ICD-10-CM | POA: Diagnosis not present

## 2018-07-19 DIAGNOSIS — G8100 Flaccid hemiplegia affecting unspecified side: Secondary | ICD-10-CM | POA: Diagnosis not present

## 2018-07-19 DIAGNOSIS — G8929 Other chronic pain: Secondary | ICD-10-CM | POA: Diagnosis not present

## 2018-07-19 DIAGNOSIS — M25561 Pain in right knee: Secondary | ICD-10-CM | POA: Diagnosis not present

## 2018-07-19 DIAGNOSIS — Z6830 Body mass index (BMI) 30.0-30.9, adult: Secondary | ICD-10-CM | POA: Diagnosis not present

## 2018-07-19 DIAGNOSIS — M25551 Pain in right hip: Secondary | ICD-10-CM | POA: Diagnosis not present

## 2018-07-19 DIAGNOSIS — M79671 Pain in right foot: Secondary | ICD-10-CM | POA: Diagnosis not present

## 2018-07-24 DIAGNOSIS — D631 Anemia in chronic kidney disease: Secondary | ICD-10-CM | POA: Diagnosis not present

## 2018-07-24 DIAGNOSIS — E1122 Type 2 diabetes mellitus with diabetic chronic kidney disease: Secondary | ICD-10-CM | POA: Diagnosis not present

## 2018-07-24 DIAGNOSIS — I130 Hypertensive heart and chronic kidney disease with heart failure and stage 1 through stage 4 chronic kidney disease, or unspecified chronic kidney disease: Secondary | ICD-10-CM | POA: Diagnosis not present

## 2018-07-24 DIAGNOSIS — N183 Chronic kidney disease, stage 3 (moderate): Secondary | ICD-10-CM | POA: Diagnosis not present

## 2018-07-24 DIAGNOSIS — I2510 Atherosclerotic heart disease of native coronary artery without angina pectoris: Secondary | ICD-10-CM | POA: Diagnosis not present

## 2018-07-24 DIAGNOSIS — I5042 Chronic combined systolic (congestive) and diastolic (congestive) heart failure: Secondary | ICD-10-CM | POA: Diagnosis not present

## 2018-07-25 DIAGNOSIS — I2510 Atherosclerotic heart disease of native coronary artery without angina pectoris: Secondary | ICD-10-CM | POA: Diagnosis not present

## 2018-07-25 DIAGNOSIS — I130 Hypertensive heart and chronic kidney disease with heart failure and stage 1 through stage 4 chronic kidney disease, or unspecified chronic kidney disease: Secondary | ICD-10-CM | POA: Diagnosis not present

## 2018-07-25 DIAGNOSIS — E1122 Type 2 diabetes mellitus with diabetic chronic kidney disease: Secondary | ICD-10-CM | POA: Diagnosis not present

## 2018-07-25 DIAGNOSIS — D631 Anemia in chronic kidney disease: Secondary | ICD-10-CM | POA: Diagnosis not present

## 2018-07-25 DIAGNOSIS — I5042 Chronic combined systolic (congestive) and diastolic (congestive) heart failure: Secondary | ICD-10-CM | POA: Diagnosis not present

## 2018-07-25 DIAGNOSIS — N183 Chronic kidney disease, stage 3 (moderate): Secondary | ICD-10-CM | POA: Diagnosis not present

## 2018-07-27 DIAGNOSIS — E785 Hyperlipidemia, unspecified: Secondary | ICD-10-CM | POA: Diagnosis not present

## 2018-07-27 DIAGNOSIS — I509 Heart failure, unspecified: Secondary | ICD-10-CM | POA: Diagnosis not present

## 2018-07-27 DIAGNOSIS — E1122 Type 2 diabetes mellitus with diabetic chronic kidney disease: Secondary | ICD-10-CM | POA: Diagnosis not present

## 2018-07-27 DIAGNOSIS — F419 Anxiety disorder, unspecified: Secondary | ICD-10-CM | POA: Diagnosis not present

## 2018-07-27 DIAGNOSIS — D631 Anemia in chronic kidney disease: Secondary | ICD-10-CM | POA: Diagnosis not present

## 2018-07-27 DIAGNOSIS — I25119 Atherosclerotic heart disease of native coronary artery with unspecified angina pectoris: Secondary | ICD-10-CM | POA: Diagnosis not present

## 2018-07-27 DIAGNOSIS — N184 Chronic kidney disease, stage 4 (severe): Secondary | ICD-10-CM | POA: Diagnosis not present

## 2018-07-27 DIAGNOSIS — F329 Major depressive disorder, single episode, unspecified: Secondary | ICD-10-CM | POA: Diagnosis not present

## 2018-07-27 DIAGNOSIS — I350 Nonrheumatic aortic (valve) stenosis: Secondary | ICD-10-CM | POA: Diagnosis not present

## 2018-07-27 DIAGNOSIS — I679 Cerebrovascular disease, unspecified: Secondary | ICD-10-CM | POA: Diagnosis not present

## 2018-07-27 DIAGNOSIS — S90822D Blister (nonthermal), left foot, subsequent encounter: Secondary | ICD-10-CM | POA: Diagnosis not present

## 2018-07-27 DIAGNOSIS — I130 Hypertensive heart and chronic kidney disease with heart failure and stage 1 through stage 4 chronic kidney disease, or unspecified chronic kidney disease: Secondary | ICD-10-CM | POA: Diagnosis not present

## 2018-07-27 DIAGNOSIS — G43909 Migraine, unspecified, not intractable, without status migrainosus: Secondary | ICD-10-CM | POA: Diagnosis not present

## 2018-07-27 DIAGNOSIS — I252 Old myocardial infarction: Secondary | ICD-10-CM | POA: Diagnosis not present

## 2018-07-27 DIAGNOSIS — Z9981 Dependence on supplemental oxygen: Secondary | ICD-10-CM | POA: Diagnosis not present

## 2018-07-27 DIAGNOSIS — G40909 Epilepsy, unspecified, not intractable, without status epilepticus: Secondary | ICD-10-CM | POA: Diagnosis not present

## 2018-07-28 DIAGNOSIS — N184 Chronic kidney disease, stage 4 (severe): Secondary | ICD-10-CM | POA: Diagnosis not present

## 2018-07-28 DIAGNOSIS — D631 Anemia in chronic kidney disease: Secondary | ICD-10-CM | POA: Diagnosis not present

## 2018-07-28 DIAGNOSIS — I130 Hypertensive heart and chronic kidney disease with heart failure and stage 1 through stage 4 chronic kidney disease, or unspecified chronic kidney disease: Secondary | ICD-10-CM | POA: Diagnosis not present

## 2018-07-28 DIAGNOSIS — E1122 Type 2 diabetes mellitus with diabetic chronic kidney disease: Secondary | ICD-10-CM | POA: Diagnosis not present

## 2018-07-28 DIAGNOSIS — I350 Nonrheumatic aortic (valve) stenosis: Secondary | ICD-10-CM | POA: Diagnosis not present

## 2018-07-28 DIAGNOSIS — I509 Heart failure, unspecified: Secondary | ICD-10-CM | POA: Diagnosis not present

## 2018-08-01 DIAGNOSIS — E1122 Type 2 diabetes mellitus with diabetic chronic kidney disease: Secondary | ICD-10-CM | POA: Diagnosis not present

## 2018-08-01 DIAGNOSIS — I130 Hypertensive heart and chronic kidney disease with heart failure and stage 1 through stage 4 chronic kidney disease, or unspecified chronic kidney disease: Secondary | ICD-10-CM | POA: Diagnosis not present

## 2018-08-01 DIAGNOSIS — I350 Nonrheumatic aortic (valve) stenosis: Secondary | ICD-10-CM | POA: Diagnosis not present

## 2018-08-01 DIAGNOSIS — I509 Heart failure, unspecified: Secondary | ICD-10-CM | POA: Diagnosis not present

## 2018-08-01 DIAGNOSIS — N184 Chronic kidney disease, stage 4 (severe): Secondary | ICD-10-CM | POA: Diagnosis not present

## 2018-08-01 DIAGNOSIS — D631 Anemia in chronic kidney disease: Secondary | ICD-10-CM | POA: Diagnosis not present

## 2018-08-02 DIAGNOSIS — E1165 Type 2 diabetes mellitus with hyperglycemia: Secondary | ICD-10-CM | POA: Diagnosis not present

## 2018-08-02 DIAGNOSIS — J9611 Chronic respiratory failure with hypoxia: Secondary | ICD-10-CM | POA: Diagnosis not present

## 2018-08-02 DIAGNOSIS — G894 Chronic pain syndrome: Secondary | ICD-10-CM | POA: Diagnosis not present

## 2018-08-02 DIAGNOSIS — I5042 Chronic combined systolic (congestive) and diastolic (congestive) heart failure: Secondary | ICD-10-CM | POA: Diagnosis not present

## 2018-08-02 DIAGNOSIS — Z7982 Long term (current) use of aspirin: Secondary | ICD-10-CM | POA: Diagnosis not present

## 2018-08-03 ENCOUNTER — Other Ambulatory Visit: Payer: Self-pay

## 2018-08-03 DIAGNOSIS — I10 Essential (primary) hypertension: Secondary | ICD-10-CM | POA: Diagnosis not present

## 2018-08-05 DIAGNOSIS — I252 Old myocardial infarction: Secondary | ICD-10-CM | POA: Diagnosis not present

## 2018-08-05 DIAGNOSIS — E785 Hyperlipidemia, unspecified: Secondary | ICD-10-CM | POA: Diagnosis not present

## 2018-08-05 DIAGNOSIS — I679 Cerebrovascular disease, unspecified: Secondary | ICD-10-CM | POA: Diagnosis not present

## 2018-08-05 DIAGNOSIS — G40909 Epilepsy, unspecified, not intractable, without status epilepticus: Secondary | ICD-10-CM | POA: Diagnosis not present

## 2018-08-05 DIAGNOSIS — I130 Hypertensive heart and chronic kidney disease with heart failure and stage 1 through stage 4 chronic kidney disease, or unspecified chronic kidney disease: Secondary | ICD-10-CM | POA: Diagnosis not present

## 2018-08-05 DIAGNOSIS — E1122 Type 2 diabetes mellitus with diabetic chronic kidney disease: Secondary | ICD-10-CM | POA: Diagnosis not present

## 2018-08-05 DIAGNOSIS — Z9981 Dependence on supplemental oxygen: Secondary | ICD-10-CM | POA: Diagnosis not present

## 2018-08-05 DIAGNOSIS — I25119 Atherosclerotic heart disease of native coronary artery with unspecified angina pectoris: Secondary | ICD-10-CM | POA: Diagnosis not present

## 2018-08-05 DIAGNOSIS — I350 Nonrheumatic aortic (valve) stenosis: Secondary | ICD-10-CM | POA: Diagnosis not present

## 2018-08-05 DIAGNOSIS — F329 Major depressive disorder, single episode, unspecified: Secondary | ICD-10-CM | POA: Diagnosis not present

## 2018-08-05 DIAGNOSIS — N184 Chronic kidney disease, stage 4 (severe): Secondary | ICD-10-CM | POA: Diagnosis not present

## 2018-08-05 DIAGNOSIS — D631 Anemia in chronic kidney disease: Secondary | ICD-10-CM | POA: Diagnosis not present

## 2018-08-05 DIAGNOSIS — I509 Heart failure, unspecified: Secondary | ICD-10-CM | POA: Diagnosis not present

## 2018-08-05 DIAGNOSIS — G43909 Migraine, unspecified, not intractable, without status migrainosus: Secondary | ICD-10-CM | POA: Diagnosis not present

## 2018-08-05 DIAGNOSIS — S90822D Blister (nonthermal), left foot, subsequent encounter: Secondary | ICD-10-CM | POA: Diagnosis not present

## 2018-08-05 DIAGNOSIS — F419 Anxiety disorder, unspecified: Secondary | ICD-10-CM | POA: Diagnosis not present

## 2018-08-09 DIAGNOSIS — N184 Chronic kidney disease, stage 4 (severe): Secondary | ICD-10-CM | POA: Diagnosis not present

## 2018-08-09 DIAGNOSIS — I509 Heart failure, unspecified: Secondary | ICD-10-CM | POA: Diagnosis not present

## 2018-08-09 DIAGNOSIS — D631 Anemia in chronic kidney disease: Secondary | ICD-10-CM | POA: Diagnosis not present

## 2018-08-09 DIAGNOSIS — E1122 Type 2 diabetes mellitus with diabetic chronic kidney disease: Secondary | ICD-10-CM | POA: Diagnosis not present

## 2018-08-09 DIAGNOSIS — I130 Hypertensive heart and chronic kidney disease with heart failure and stage 1 through stage 4 chronic kidney disease, or unspecified chronic kidney disease: Secondary | ICD-10-CM | POA: Diagnosis not present

## 2018-08-09 DIAGNOSIS — I350 Nonrheumatic aortic (valve) stenosis: Secondary | ICD-10-CM | POA: Diagnosis not present

## 2018-08-14 DIAGNOSIS — I350 Nonrheumatic aortic (valve) stenosis: Secondary | ICD-10-CM | POA: Diagnosis not present

## 2018-08-14 DIAGNOSIS — N184 Chronic kidney disease, stage 4 (severe): Secondary | ICD-10-CM | POA: Diagnosis not present

## 2018-08-14 DIAGNOSIS — I130 Hypertensive heart and chronic kidney disease with heart failure and stage 1 through stage 4 chronic kidney disease, or unspecified chronic kidney disease: Secondary | ICD-10-CM | POA: Diagnosis not present

## 2018-08-14 DIAGNOSIS — E1122 Type 2 diabetes mellitus with diabetic chronic kidney disease: Secondary | ICD-10-CM | POA: Diagnosis not present

## 2018-08-14 DIAGNOSIS — I509 Heart failure, unspecified: Secondary | ICD-10-CM | POA: Diagnosis not present

## 2018-08-14 DIAGNOSIS — D631 Anemia in chronic kidney disease: Secondary | ICD-10-CM | POA: Diagnosis not present

## 2018-08-16 DIAGNOSIS — E1122 Type 2 diabetes mellitus with diabetic chronic kidney disease: Secondary | ICD-10-CM | POA: Diagnosis not present

## 2018-08-16 DIAGNOSIS — D631 Anemia in chronic kidney disease: Secondary | ICD-10-CM | POA: Diagnosis not present

## 2018-08-16 DIAGNOSIS — M25561 Pain in right knee: Secondary | ICD-10-CM | POA: Diagnosis not present

## 2018-08-16 DIAGNOSIS — G8929 Other chronic pain: Secondary | ICD-10-CM | POA: Diagnosis not present

## 2018-08-16 DIAGNOSIS — I130 Hypertensive heart and chronic kidney disease with heart failure and stage 1 through stage 4 chronic kidney disease, or unspecified chronic kidney disease: Secondary | ICD-10-CM | POA: Diagnosis not present

## 2018-08-16 DIAGNOSIS — I509 Heart failure, unspecified: Secondary | ICD-10-CM | POA: Diagnosis not present

## 2018-08-16 DIAGNOSIS — G8100 Flaccid hemiplegia affecting unspecified side: Secondary | ICD-10-CM | POA: Diagnosis not present

## 2018-08-16 DIAGNOSIS — M79671 Pain in right foot: Secondary | ICD-10-CM | POA: Diagnosis not present

## 2018-08-16 DIAGNOSIS — I350 Nonrheumatic aortic (valve) stenosis: Secondary | ICD-10-CM | POA: Diagnosis not present

## 2018-08-16 DIAGNOSIS — Z6830 Body mass index (BMI) 30.0-30.9, adult: Secondary | ICD-10-CM | POA: Diagnosis not present

## 2018-08-16 DIAGNOSIS — M25551 Pain in right hip: Secondary | ICD-10-CM | POA: Diagnosis not present

## 2018-08-16 DIAGNOSIS — N184 Chronic kidney disease, stage 4 (severe): Secondary | ICD-10-CM | POA: Diagnosis not present

## 2018-08-21 DIAGNOSIS — D631 Anemia in chronic kidney disease: Secondary | ICD-10-CM | POA: Diagnosis not present

## 2018-08-21 DIAGNOSIS — I130 Hypertensive heart and chronic kidney disease with heart failure and stage 1 through stage 4 chronic kidney disease, or unspecified chronic kidney disease: Secondary | ICD-10-CM | POA: Diagnosis not present

## 2018-08-21 DIAGNOSIS — I509 Heart failure, unspecified: Secondary | ICD-10-CM | POA: Diagnosis not present

## 2018-08-21 DIAGNOSIS — N184 Chronic kidney disease, stage 4 (severe): Secondary | ICD-10-CM | POA: Diagnosis not present

## 2018-08-21 DIAGNOSIS — I350 Nonrheumatic aortic (valve) stenosis: Secondary | ICD-10-CM | POA: Diagnosis not present

## 2018-08-21 DIAGNOSIS — E1122 Type 2 diabetes mellitus with diabetic chronic kidney disease: Secondary | ICD-10-CM | POA: Diagnosis not present

## 2018-08-23 DIAGNOSIS — N184 Chronic kidney disease, stage 4 (severe): Secondary | ICD-10-CM | POA: Diagnosis not present

## 2018-08-23 DIAGNOSIS — E1122 Type 2 diabetes mellitus with diabetic chronic kidney disease: Secondary | ICD-10-CM | POA: Diagnosis not present

## 2018-08-23 DIAGNOSIS — D631 Anemia in chronic kidney disease: Secondary | ICD-10-CM | POA: Diagnosis not present

## 2018-08-23 DIAGNOSIS — I509 Heart failure, unspecified: Secondary | ICD-10-CM | POA: Diagnosis not present

## 2018-08-23 DIAGNOSIS — I350 Nonrheumatic aortic (valve) stenosis: Secondary | ICD-10-CM | POA: Diagnosis not present

## 2018-08-23 DIAGNOSIS — I130 Hypertensive heart and chronic kidney disease with heart failure and stage 1 through stage 4 chronic kidney disease, or unspecified chronic kidney disease: Secondary | ICD-10-CM | POA: Diagnosis not present

## 2018-08-28 DIAGNOSIS — I509 Heart failure, unspecified: Secondary | ICD-10-CM | POA: Diagnosis not present

## 2018-08-28 DIAGNOSIS — I130 Hypertensive heart and chronic kidney disease with heart failure and stage 1 through stage 4 chronic kidney disease, or unspecified chronic kidney disease: Secondary | ICD-10-CM | POA: Diagnosis not present

## 2018-08-28 DIAGNOSIS — I350 Nonrheumatic aortic (valve) stenosis: Secondary | ICD-10-CM | POA: Diagnosis not present

## 2018-08-28 DIAGNOSIS — N184 Chronic kidney disease, stage 4 (severe): Secondary | ICD-10-CM | POA: Diagnosis not present

## 2018-08-28 DIAGNOSIS — E1122 Type 2 diabetes mellitus with diabetic chronic kidney disease: Secondary | ICD-10-CM | POA: Diagnosis not present

## 2018-08-28 DIAGNOSIS — D631 Anemia in chronic kidney disease: Secondary | ICD-10-CM | POA: Diagnosis not present

## 2018-08-30 DIAGNOSIS — N184 Chronic kidney disease, stage 4 (severe): Secondary | ICD-10-CM | POA: Diagnosis not present

## 2018-08-30 DIAGNOSIS — D631 Anemia in chronic kidney disease: Secondary | ICD-10-CM | POA: Diagnosis not present

## 2018-08-30 DIAGNOSIS — I509 Heart failure, unspecified: Secondary | ICD-10-CM | POA: Diagnosis not present

## 2018-08-30 DIAGNOSIS — I130 Hypertensive heart and chronic kidney disease with heart failure and stage 1 through stage 4 chronic kidney disease, or unspecified chronic kidney disease: Secondary | ICD-10-CM | POA: Diagnosis not present

## 2018-08-30 DIAGNOSIS — E1122 Type 2 diabetes mellitus with diabetic chronic kidney disease: Secondary | ICD-10-CM | POA: Diagnosis not present

## 2018-08-30 DIAGNOSIS — I350 Nonrheumatic aortic (valve) stenosis: Secondary | ICD-10-CM | POA: Diagnosis not present

## 2018-09-05 DIAGNOSIS — S90822D Blister (nonthermal), left foot, subsequent encounter: Secondary | ICD-10-CM | POA: Diagnosis not present

## 2018-09-05 DIAGNOSIS — D631 Anemia in chronic kidney disease: Secondary | ICD-10-CM | POA: Diagnosis not present

## 2018-09-05 DIAGNOSIS — E785 Hyperlipidemia, unspecified: Secondary | ICD-10-CM | POA: Diagnosis not present

## 2018-09-05 DIAGNOSIS — I252 Old myocardial infarction: Secondary | ICD-10-CM | POA: Diagnosis not present

## 2018-09-05 DIAGNOSIS — Z9981 Dependence on supplemental oxygen: Secondary | ICD-10-CM | POA: Diagnosis not present

## 2018-09-05 DIAGNOSIS — I679 Cerebrovascular disease, unspecified: Secondary | ICD-10-CM | POA: Diagnosis not present

## 2018-09-05 DIAGNOSIS — I130 Hypertensive heart and chronic kidney disease with heart failure and stage 1 through stage 4 chronic kidney disease, or unspecified chronic kidney disease: Secondary | ICD-10-CM | POA: Diagnosis not present

## 2018-09-05 DIAGNOSIS — I509 Heart failure, unspecified: Secondary | ICD-10-CM | POA: Diagnosis not present

## 2018-09-05 DIAGNOSIS — F329 Major depressive disorder, single episode, unspecified: Secondary | ICD-10-CM | POA: Diagnosis not present

## 2018-09-05 DIAGNOSIS — G43909 Migraine, unspecified, not intractable, without status migrainosus: Secondary | ICD-10-CM | POA: Diagnosis not present

## 2018-09-05 DIAGNOSIS — E1122 Type 2 diabetes mellitus with diabetic chronic kidney disease: Secondary | ICD-10-CM | POA: Diagnosis not present

## 2018-09-05 DIAGNOSIS — I350 Nonrheumatic aortic (valve) stenosis: Secondary | ICD-10-CM | POA: Diagnosis not present

## 2018-09-05 DIAGNOSIS — G40909 Epilepsy, unspecified, not intractable, without status epilepticus: Secondary | ICD-10-CM | POA: Diagnosis not present

## 2018-09-05 DIAGNOSIS — F419 Anxiety disorder, unspecified: Secondary | ICD-10-CM | POA: Diagnosis not present

## 2018-09-05 DIAGNOSIS — N184 Chronic kidney disease, stage 4 (severe): Secondary | ICD-10-CM | POA: Diagnosis not present

## 2018-09-05 DIAGNOSIS — I25119 Atherosclerotic heart disease of native coronary artery with unspecified angina pectoris: Secondary | ICD-10-CM | POA: Diagnosis not present

## 2018-09-06 DIAGNOSIS — I130 Hypertensive heart and chronic kidney disease with heart failure and stage 1 through stage 4 chronic kidney disease, or unspecified chronic kidney disease: Secondary | ICD-10-CM | POA: Diagnosis not present

## 2018-09-06 DIAGNOSIS — D631 Anemia in chronic kidney disease: Secondary | ICD-10-CM | POA: Diagnosis not present

## 2018-09-06 DIAGNOSIS — E1122 Type 2 diabetes mellitus with diabetic chronic kidney disease: Secondary | ICD-10-CM | POA: Diagnosis not present

## 2018-09-06 DIAGNOSIS — N184 Chronic kidney disease, stage 4 (severe): Secondary | ICD-10-CM | POA: Diagnosis not present

## 2018-09-06 DIAGNOSIS — I509 Heart failure, unspecified: Secondary | ICD-10-CM | POA: Diagnosis not present

## 2018-09-06 DIAGNOSIS — I350 Nonrheumatic aortic (valve) stenosis: Secondary | ICD-10-CM | POA: Diagnosis not present

## 2018-09-12 DIAGNOSIS — D631 Anemia in chronic kidney disease: Secondary | ICD-10-CM | POA: Diagnosis not present

## 2018-09-12 DIAGNOSIS — E1122 Type 2 diabetes mellitus with diabetic chronic kidney disease: Secondary | ICD-10-CM | POA: Diagnosis not present

## 2018-09-12 DIAGNOSIS — I350 Nonrheumatic aortic (valve) stenosis: Secondary | ICD-10-CM | POA: Diagnosis not present

## 2018-09-12 DIAGNOSIS — I509 Heart failure, unspecified: Secondary | ICD-10-CM | POA: Diagnosis not present

## 2018-09-12 DIAGNOSIS — N184 Chronic kidney disease, stage 4 (severe): Secondary | ICD-10-CM | POA: Diagnosis not present

## 2018-09-12 DIAGNOSIS — I130 Hypertensive heart and chronic kidney disease with heart failure and stage 1 through stage 4 chronic kidney disease, or unspecified chronic kidney disease: Secondary | ICD-10-CM | POA: Diagnosis not present

## 2018-09-18 DIAGNOSIS — D631 Anemia in chronic kidney disease: Secondary | ICD-10-CM | POA: Diagnosis not present

## 2018-09-18 DIAGNOSIS — N184 Chronic kidney disease, stage 4 (severe): Secondary | ICD-10-CM | POA: Diagnosis not present

## 2018-09-18 DIAGNOSIS — I130 Hypertensive heart and chronic kidney disease with heart failure and stage 1 through stage 4 chronic kidney disease, or unspecified chronic kidney disease: Secondary | ICD-10-CM | POA: Diagnosis not present

## 2018-09-18 DIAGNOSIS — I509 Heart failure, unspecified: Secondary | ICD-10-CM | POA: Diagnosis not present

## 2018-09-18 DIAGNOSIS — E1122 Type 2 diabetes mellitus with diabetic chronic kidney disease: Secondary | ICD-10-CM | POA: Diagnosis not present

## 2018-09-18 DIAGNOSIS — I350 Nonrheumatic aortic (valve) stenosis: Secondary | ICD-10-CM | POA: Diagnosis not present

## 2018-09-20 DIAGNOSIS — N184 Chronic kidney disease, stage 4 (severe): Secondary | ICD-10-CM | POA: Diagnosis not present

## 2018-09-20 DIAGNOSIS — D631 Anemia in chronic kidney disease: Secondary | ICD-10-CM | POA: Diagnosis not present

## 2018-09-20 DIAGNOSIS — E1122 Type 2 diabetes mellitus with diabetic chronic kidney disease: Secondary | ICD-10-CM | POA: Diagnosis not present

## 2018-09-20 DIAGNOSIS — I509 Heart failure, unspecified: Secondary | ICD-10-CM | POA: Diagnosis not present

## 2018-09-20 DIAGNOSIS — I130 Hypertensive heart and chronic kidney disease with heart failure and stage 1 through stage 4 chronic kidney disease, or unspecified chronic kidney disease: Secondary | ICD-10-CM | POA: Diagnosis not present

## 2018-09-20 DIAGNOSIS — I350 Nonrheumatic aortic (valve) stenosis: Secondary | ICD-10-CM | POA: Diagnosis not present

## 2018-09-21 DIAGNOSIS — G8929 Other chronic pain: Secondary | ICD-10-CM | POA: Diagnosis not present

## 2018-09-21 DIAGNOSIS — M25561 Pain in right knee: Secondary | ICD-10-CM | POA: Diagnosis not present

## 2018-09-21 DIAGNOSIS — M79671 Pain in right foot: Secondary | ICD-10-CM | POA: Diagnosis not present

## 2018-09-21 DIAGNOSIS — G8100 Flaccid hemiplegia affecting unspecified side: Secondary | ICD-10-CM | POA: Diagnosis not present

## 2018-09-21 DIAGNOSIS — Z6830 Body mass index (BMI) 30.0-30.9, adult: Secondary | ICD-10-CM | POA: Diagnosis not present

## 2018-09-21 DIAGNOSIS — M25551 Pain in right hip: Secondary | ICD-10-CM | POA: Diagnosis not present

## 2018-09-25 DIAGNOSIS — E1165 Type 2 diabetes mellitus with hyperglycemia: Secondary | ICD-10-CM | POA: Diagnosis not present

## 2018-09-25 DIAGNOSIS — D631 Anemia in chronic kidney disease: Secondary | ICD-10-CM | POA: Diagnosis not present

## 2018-09-25 DIAGNOSIS — I130 Hypertensive heart and chronic kidney disease with heart failure and stage 1 through stage 4 chronic kidney disease, or unspecified chronic kidney disease: Secondary | ICD-10-CM | POA: Diagnosis not present

## 2018-09-25 DIAGNOSIS — I10 Essential (primary) hypertension: Secondary | ICD-10-CM | POA: Diagnosis not present

## 2018-09-25 DIAGNOSIS — E1122 Type 2 diabetes mellitus with diabetic chronic kidney disease: Secondary | ICD-10-CM | POA: Diagnosis not present

## 2018-09-25 DIAGNOSIS — G43909 Migraine, unspecified, not intractable, without status migrainosus: Secondary | ICD-10-CM | POA: Diagnosis not present

## 2018-09-25 DIAGNOSIS — N184 Chronic kidney disease, stage 4 (severe): Secondary | ICD-10-CM | POA: Diagnosis not present

## 2018-09-25 DIAGNOSIS — I350 Nonrheumatic aortic (valve) stenosis: Secondary | ICD-10-CM | POA: Diagnosis not present

## 2018-09-25 DIAGNOSIS — I509 Heart failure, unspecified: Secondary | ICD-10-CM | POA: Diagnosis not present

## 2018-09-25 DIAGNOSIS — G40909 Epilepsy, unspecified, not intractable, without status epilepticus: Secondary | ICD-10-CM | POA: Diagnosis not present

## 2018-09-25 DIAGNOSIS — I5042 Chronic combined systolic (congestive) and diastolic (congestive) heart failure: Secondary | ICD-10-CM | POA: Diagnosis not present

## 2018-09-30 DIAGNOSIS — Z23 Encounter for immunization: Secondary | ICD-10-CM | POA: Diagnosis not present

## 2018-10-02 DIAGNOSIS — I350 Nonrheumatic aortic (valve) stenosis: Secondary | ICD-10-CM | POA: Diagnosis not present

## 2018-10-02 DIAGNOSIS — E1122 Type 2 diabetes mellitus with diabetic chronic kidney disease: Secondary | ICD-10-CM | POA: Diagnosis not present

## 2018-10-02 DIAGNOSIS — D631 Anemia in chronic kidney disease: Secondary | ICD-10-CM | POA: Diagnosis not present

## 2018-10-02 DIAGNOSIS — I130 Hypertensive heart and chronic kidney disease with heart failure and stage 1 through stage 4 chronic kidney disease, or unspecified chronic kidney disease: Secondary | ICD-10-CM | POA: Diagnosis not present

## 2018-10-02 DIAGNOSIS — I509 Heart failure, unspecified: Secondary | ICD-10-CM | POA: Diagnosis not present

## 2018-10-02 DIAGNOSIS — N184 Chronic kidney disease, stage 4 (severe): Secondary | ICD-10-CM | POA: Diagnosis not present

## 2018-10-04 DIAGNOSIS — I509 Heart failure, unspecified: Secondary | ICD-10-CM | POA: Diagnosis not present

## 2018-10-04 DIAGNOSIS — N184 Chronic kidney disease, stage 4 (severe): Secondary | ICD-10-CM | POA: Diagnosis not present

## 2018-10-04 DIAGNOSIS — I350 Nonrheumatic aortic (valve) stenosis: Secondary | ICD-10-CM | POA: Diagnosis not present

## 2018-10-04 DIAGNOSIS — D631 Anemia in chronic kidney disease: Secondary | ICD-10-CM | POA: Diagnosis not present

## 2018-10-04 DIAGNOSIS — E1122 Type 2 diabetes mellitus with diabetic chronic kidney disease: Secondary | ICD-10-CM | POA: Diagnosis not present

## 2018-10-04 DIAGNOSIS — I130 Hypertensive heart and chronic kidney disease with heart failure and stage 1 through stage 4 chronic kidney disease, or unspecified chronic kidney disease: Secondary | ICD-10-CM | POA: Diagnosis not present

## 2018-10-05 DIAGNOSIS — F329 Major depressive disorder, single episode, unspecified: Secondary | ICD-10-CM | POA: Diagnosis not present

## 2018-10-05 DIAGNOSIS — E785 Hyperlipidemia, unspecified: Secondary | ICD-10-CM | POA: Diagnosis not present

## 2018-10-05 DIAGNOSIS — S90822D Blister (nonthermal), left foot, subsequent encounter: Secondary | ICD-10-CM | POA: Diagnosis not present

## 2018-10-05 DIAGNOSIS — F419 Anxiety disorder, unspecified: Secondary | ICD-10-CM | POA: Diagnosis not present

## 2018-10-05 DIAGNOSIS — G40909 Epilepsy, unspecified, not intractable, without status epilepticus: Secondary | ICD-10-CM | POA: Diagnosis not present

## 2018-10-05 DIAGNOSIS — N184 Chronic kidney disease, stage 4 (severe): Secondary | ICD-10-CM | POA: Diagnosis not present

## 2018-10-05 DIAGNOSIS — E1122 Type 2 diabetes mellitus with diabetic chronic kidney disease: Secondary | ICD-10-CM | POA: Diagnosis not present

## 2018-10-05 DIAGNOSIS — G43909 Migraine, unspecified, not intractable, without status migrainosus: Secondary | ICD-10-CM | POA: Diagnosis not present

## 2018-10-05 DIAGNOSIS — I130 Hypertensive heart and chronic kidney disease with heart failure and stage 1 through stage 4 chronic kidney disease, or unspecified chronic kidney disease: Secondary | ICD-10-CM | POA: Diagnosis not present

## 2018-10-05 DIAGNOSIS — I252 Old myocardial infarction: Secondary | ICD-10-CM | POA: Diagnosis not present

## 2018-10-05 DIAGNOSIS — I25119 Atherosclerotic heart disease of native coronary artery with unspecified angina pectoris: Secondary | ICD-10-CM | POA: Diagnosis not present

## 2018-10-05 DIAGNOSIS — I350 Nonrheumatic aortic (valve) stenosis: Secondary | ICD-10-CM | POA: Diagnosis not present

## 2018-10-05 DIAGNOSIS — Z9981 Dependence on supplemental oxygen: Secondary | ICD-10-CM | POA: Diagnosis not present

## 2018-10-05 DIAGNOSIS — I679 Cerebrovascular disease, unspecified: Secondary | ICD-10-CM | POA: Diagnosis not present

## 2018-10-05 DIAGNOSIS — D631 Anemia in chronic kidney disease: Secondary | ICD-10-CM | POA: Diagnosis not present

## 2018-10-05 DIAGNOSIS — I509 Heart failure, unspecified: Secondary | ICD-10-CM | POA: Diagnosis not present

## 2018-10-07 DIAGNOSIS — I509 Heart failure, unspecified: Secondary | ICD-10-CM | POA: Diagnosis not present

## 2018-10-07 DIAGNOSIS — E1122 Type 2 diabetes mellitus with diabetic chronic kidney disease: Secondary | ICD-10-CM | POA: Diagnosis not present

## 2018-10-07 DIAGNOSIS — D631 Anemia in chronic kidney disease: Secondary | ICD-10-CM | POA: Diagnosis not present

## 2018-10-07 DIAGNOSIS — N184 Chronic kidney disease, stage 4 (severe): Secondary | ICD-10-CM | POA: Diagnosis not present

## 2018-10-07 DIAGNOSIS — I350 Nonrheumatic aortic (valve) stenosis: Secondary | ICD-10-CM | POA: Diagnosis not present

## 2018-10-07 DIAGNOSIS — I130 Hypertensive heart and chronic kidney disease with heart failure and stage 1 through stage 4 chronic kidney disease, or unspecified chronic kidney disease: Secondary | ICD-10-CM | POA: Diagnosis not present

## 2018-10-09 DIAGNOSIS — I350 Nonrheumatic aortic (valve) stenosis: Secondary | ICD-10-CM | POA: Diagnosis not present

## 2018-10-09 DIAGNOSIS — I509 Heart failure, unspecified: Secondary | ICD-10-CM | POA: Diagnosis not present

## 2018-10-09 DIAGNOSIS — I130 Hypertensive heart and chronic kidney disease with heart failure and stage 1 through stage 4 chronic kidney disease, or unspecified chronic kidney disease: Secondary | ICD-10-CM | POA: Diagnosis not present

## 2018-10-09 DIAGNOSIS — E1122 Type 2 diabetes mellitus with diabetic chronic kidney disease: Secondary | ICD-10-CM | POA: Diagnosis not present

## 2018-10-09 DIAGNOSIS — D631 Anemia in chronic kidney disease: Secondary | ICD-10-CM | POA: Diagnosis not present

## 2018-10-09 DIAGNOSIS — N184 Chronic kidney disease, stage 4 (severe): Secondary | ICD-10-CM | POA: Diagnosis not present

## 2018-10-10 DIAGNOSIS — Z6830 Body mass index (BMI) 30.0-30.9, adult: Secondary | ICD-10-CM | POA: Diagnosis not present

## 2018-10-10 DIAGNOSIS — G8100 Flaccid hemiplegia affecting unspecified side: Secondary | ICD-10-CM | POA: Diagnosis not present

## 2018-10-10 DIAGNOSIS — M25551 Pain in right hip: Secondary | ICD-10-CM | POA: Diagnosis not present

## 2018-10-10 DIAGNOSIS — G8929 Other chronic pain: Secondary | ICD-10-CM | POA: Diagnosis not present

## 2018-10-10 DIAGNOSIS — M25561 Pain in right knee: Secondary | ICD-10-CM | POA: Diagnosis not present

## 2018-10-10 DIAGNOSIS — M79671 Pain in right foot: Secondary | ICD-10-CM | POA: Diagnosis not present

## 2018-10-11 DIAGNOSIS — N184 Chronic kidney disease, stage 4 (severe): Secondary | ICD-10-CM | POA: Diagnosis not present

## 2018-10-11 DIAGNOSIS — I509 Heart failure, unspecified: Secondary | ICD-10-CM | POA: Diagnosis not present

## 2018-10-11 DIAGNOSIS — I350 Nonrheumatic aortic (valve) stenosis: Secondary | ICD-10-CM | POA: Diagnosis not present

## 2018-10-11 DIAGNOSIS — D631 Anemia in chronic kidney disease: Secondary | ICD-10-CM | POA: Diagnosis not present

## 2018-10-11 DIAGNOSIS — E1122 Type 2 diabetes mellitus with diabetic chronic kidney disease: Secondary | ICD-10-CM | POA: Diagnosis not present

## 2018-10-11 DIAGNOSIS — I130 Hypertensive heart and chronic kidney disease with heart failure and stage 1 through stage 4 chronic kidney disease, or unspecified chronic kidney disease: Secondary | ICD-10-CM | POA: Diagnosis not present

## 2018-10-18 DIAGNOSIS — I130 Hypertensive heart and chronic kidney disease with heart failure and stage 1 through stage 4 chronic kidney disease, or unspecified chronic kidney disease: Secondary | ICD-10-CM | POA: Diagnosis not present

## 2018-10-18 DIAGNOSIS — N184 Chronic kidney disease, stage 4 (severe): Secondary | ICD-10-CM | POA: Diagnosis not present

## 2018-10-18 DIAGNOSIS — D631 Anemia in chronic kidney disease: Secondary | ICD-10-CM | POA: Diagnosis not present

## 2018-10-18 DIAGNOSIS — I509 Heart failure, unspecified: Secondary | ICD-10-CM | POA: Diagnosis not present

## 2018-10-18 DIAGNOSIS — I350 Nonrheumatic aortic (valve) stenosis: Secondary | ICD-10-CM | POA: Diagnosis not present

## 2018-10-18 DIAGNOSIS — E1122 Type 2 diabetes mellitus with diabetic chronic kidney disease: Secondary | ICD-10-CM | POA: Diagnosis not present

## 2018-10-19 DIAGNOSIS — I509 Heart failure, unspecified: Secondary | ICD-10-CM | POA: Diagnosis not present

## 2018-10-19 DIAGNOSIS — N184 Chronic kidney disease, stage 4 (severe): Secondary | ICD-10-CM | POA: Diagnosis not present

## 2018-10-19 DIAGNOSIS — I350 Nonrheumatic aortic (valve) stenosis: Secondary | ICD-10-CM | POA: Diagnosis not present

## 2018-10-19 DIAGNOSIS — I130 Hypertensive heart and chronic kidney disease with heart failure and stage 1 through stage 4 chronic kidney disease, or unspecified chronic kidney disease: Secondary | ICD-10-CM | POA: Diagnosis not present

## 2018-10-19 DIAGNOSIS — E1122 Type 2 diabetes mellitus with diabetic chronic kidney disease: Secondary | ICD-10-CM | POA: Diagnosis not present

## 2018-10-19 DIAGNOSIS — D631 Anemia in chronic kidney disease: Secondary | ICD-10-CM | POA: Diagnosis not present

## 2018-10-25 DIAGNOSIS — E1122 Type 2 diabetes mellitus with diabetic chronic kidney disease: Secondary | ICD-10-CM | POA: Diagnosis not present

## 2018-10-25 DIAGNOSIS — I130 Hypertensive heart and chronic kidney disease with heart failure and stage 1 through stage 4 chronic kidney disease, or unspecified chronic kidney disease: Secondary | ICD-10-CM | POA: Diagnosis not present

## 2018-10-25 DIAGNOSIS — I509 Heart failure, unspecified: Secondary | ICD-10-CM | POA: Diagnosis not present

## 2018-10-25 DIAGNOSIS — I350 Nonrheumatic aortic (valve) stenosis: Secondary | ICD-10-CM | POA: Diagnosis not present

## 2018-10-25 DIAGNOSIS — N184 Chronic kidney disease, stage 4 (severe): Secondary | ICD-10-CM | POA: Diagnosis not present

## 2018-10-25 DIAGNOSIS — D631 Anemia in chronic kidney disease: Secondary | ICD-10-CM | POA: Diagnosis not present

## 2018-10-26 DIAGNOSIS — D631 Anemia in chronic kidney disease: Secondary | ICD-10-CM | POA: Diagnosis not present

## 2018-10-26 DIAGNOSIS — E1122 Type 2 diabetes mellitus with diabetic chronic kidney disease: Secondary | ICD-10-CM | POA: Diagnosis not present

## 2018-10-26 DIAGNOSIS — I350 Nonrheumatic aortic (valve) stenosis: Secondary | ICD-10-CM | POA: Diagnosis not present

## 2018-10-26 DIAGNOSIS — N184 Chronic kidney disease, stage 4 (severe): Secondary | ICD-10-CM | POA: Diagnosis not present

## 2018-10-26 DIAGNOSIS — I509 Heart failure, unspecified: Secondary | ICD-10-CM | POA: Diagnosis not present

## 2018-10-26 DIAGNOSIS — I130 Hypertensive heart and chronic kidney disease with heart failure and stage 1 through stage 4 chronic kidney disease, or unspecified chronic kidney disease: Secondary | ICD-10-CM | POA: Diagnosis not present

## 2018-11-02 DIAGNOSIS — I509 Heart failure, unspecified: Secondary | ICD-10-CM | POA: Diagnosis not present

## 2018-11-02 DIAGNOSIS — E1122 Type 2 diabetes mellitus with diabetic chronic kidney disease: Secondary | ICD-10-CM | POA: Diagnosis not present

## 2018-11-02 DIAGNOSIS — I350 Nonrheumatic aortic (valve) stenosis: Secondary | ICD-10-CM | POA: Diagnosis not present

## 2018-11-02 DIAGNOSIS — I130 Hypertensive heart and chronic kidney disease with heart failure and stage 1 through stage 4 chronic kidney disease, or unspecified chronic kidney disease: Secondary | ICD-10-CM | POA: Diagnosis not present

## 2018-11-02 DIAGNOSIS — N184 Chronic kidney disease, stage 4 (severe): Secondary | ICD-10-CM | POA: Diagnosis not present

## 2018-11-02 DIAGNOSIS — D631 Anemia in chronic kidney disease: Secondary | ICD-10-CM | POA: Diagnosis not present

## 2018-11-05 DIAGNOSIS — Z9981 Dependence on supplemental oxygen: Secondary | ICD-10-CM | POA: Diagnosis not present

## 2018-11-05 DIAGNOSIS — R159 Full incontinence of feces: Secondary | ICD-10-CM | POA: Diagnosis not present

## 2018-11-05 DIAGNOSIS — E785 Hyperlipidemia, unspecified: Secondary | ICD-10-CM | POA: Diagnosis not present

## 2018-11-05 DIAGNOSIS — N184 Chronic kidney disease, stage 4 (severe): Secondary | ICD-10-CM | POA: Diagnosis not present

## 2018-11-05 DIAGNOSIS — I25119 Atherosclerotic heart disease of native coronary artery with unspecified angina pectoris: Secondary | ICD-10-CM | POA: Diagnosis not present

## 2018-11-05 DIAGNOSIS — G40909 Epilepsy, unspecified, not intractable, without status epilepticus: Secondary | ICD-10-CM | POA: Diagnosis not present

## 2018-11-05 DIAGNOSIS — E1122 Type 2 diabetes mellitus with diabetic chronic kidney disease: Secondary | ICD-10-CM | POA: Diagnosis not present

## 2018-11-05 DIAGNOSIS — I509 Heart failure, unspecified: Secondary | ICD-10-CM | POA: Diagnosis not present

## 2018-11-05 DIAGNOSIS — F419 Anxiety disorder, unspecified: Secondary | ICD-10-CM | POA: Diagnosis not present

## 2018-11-05 DIAGNOSIS — D631 Anemia in chronic kidney disease: Secondary | ICD-10-CM | POA: Diagnosis not present

## 2018-11-05 DIAGNOSIS — R32 Unspecified urinary incontinence: Secondary | ICD-10-CM | POA: Diagnosis not present

## 2018-11-05 DIAGNOSIS — G43909 Migraine, unspecified, not intractable, without status migrainosus: Secondary | ICD-10-CM | POA: Diagnosis not present

## 2018-11-05 DIAGNOSIS — I350 Nonrheumatic aortic (valve) stenosis: Secondary | ICD-10-CM | POA: Diagnosis not present

## 2018-11-05 DIAGNOSIS — I130 Hypertensive heart and chronic kidney disease with heart failure and stage 1 through stage 4 chronic kidney disease, or unspecified chronic kidney disease: Secondary | ICD-10-CM | POA: Diagnosis not present

## 2018-11-05 DIAGNOSIS — I679 Cerebrovascular disease, unspecified: Secondary | ICD-10-CM | POA: Diagnosis not present

## 2018-11-05 DIAGNOSIS — I252 Old myocardial infarction: Secondary | ICD-10-CM | POA: Diagnosis not present

## 2018-11-05 DIAGNOSIS — F329 Major depressive disorder, single episode, unspecified: Secondary | ICD-10-CM | POA: Diagnosis not present

## 2018-11-06 DIAGNOSIS — I509 Heart failure, unspecified: Secondary | ICD-10-CM | POA: Diagnosis not present

## 2018-11-06 DIAGNOSIS — I350 Nonrheumatic aortic (valve) stenosis: Secondary | ICD-10-CM | POA: Diagnosis not present

## 2018-11-06 DIAGNOSIS — D631 Anemia in chronic kidney disease: Secondary | ICD-10-CM | POA: Diagnosis not present

## 2018-11-06 DIAGNOSIS — E1122 Type 2 diabetes mellitus with diabetic chronic kidney disease: Secondary | ICD-10-CM | POA: Diagnosis not present

## 2018-11-06 DIAGNOSIS — N184 Chronic kidney disease, stage 4 (severe): Secondary | ICD-10-CM | POA: Diagnosis not present

## 2018-11-06 DIAGNOSIS — I130 Hypertensive heart and chronic kidney disease with heart failure and stage 1 through stage 4 chronic kidney disease, or unspecified chronic kidney disease: Secondary | ICD-10-CM | POA: Diagnosis not present

## 2018-11-07 DIAGNOSIS — M79671 Pain in right foot: Secondary | ICD-10-CM | POA: Diagnosis not present

## 2018-11-07 DIAGNOSIS — G8929 Other chronic pain: Secondary | ICD-10-CM | POA: Diagnosis not present

## 2018-11-07 DIAGNOSIS — M25551 Pain in right hip: Secondary | ICD-10-CM | POA: Diagnosis not present

## 2018-11-07 DIAGNOSIS — Z6830 Body mass index (BMI) 30.0-30.9, adult: Secondary | ICD-10-CM | POA: Diagnosis not present

## 2018-11-07 DIAGNOSIS — G8100 Flaccid hemiplegia affecting unspecified side: Secondary | ICD-10-CM | POA: Diagnosis not present

## 2018-11-07 DIAGNOSIS — M25561 Pain in right knee: Secondary | ICD-10-CM | POA: Diagnosis not present

## 2018-11-08 DIAGNOSIS — I130 Hypertensive heart and chronic kidney disease with heart failure and stage 1 through stage 4 chronic kidney disease, or unspecified chronic kidney disease: Secondary | ICD-10-CM | POA: Diagnosis not present

## 2018-11-08 DIAGNOSIS — I509 Heart failure, unspecified: Secondary | ICD-10-CM | POA: Diagnosis not present

## 2018-11-08 DIAGNOSIS — D631 Anemia in chronic kidney disease: Secondary | ICD-10-CM | POA: Diagnosis not present

## 2018-11-08 DIAGNOSIS — N184 Chronic kidney disease, stage 4 (severe): Secondary | ICD-10-CM | POA: Diagnosis not present

## 2018-11-08 DIAGNOSIS — I350 Nonrheumatic aortic (valve) stenosis: Secondary | ICD-10-CM | POA: Diagnosis not present

## 2018-11-08 DIAGNOSIS — E1122 Type 2 diabetes mellitus with diabetic chronic kidney disease: Secondary | ICD-10-CM | POA: Diagnosis not present

## 2018-11-13 DIAGNOSIS — E1122 Type 2 diabetes mellitus with diabetic chronic kidney disease: Secondary | ICD-10-CM | POA: Diagnosis not present

## 2018-11-13 DIAGNOSIS — I130 Hypertensive heart and chronic kidney disease with heart failure and stage 1 through stage 4 chronic kidney disease, or unspecified chronic kidney disease: Secondary | ICD-10-CM | POA: Diagnosis not present

## 2018-11-13 DIAGNOSIS — I509 Heart failure, unspecified: Secondary | ICD-10-CM | POA: Diagnosis not present

## 2018-11-13 DIAGNOSIS — D631 Anemia in chronic kidney disease: Secondary | ICD-10-CM | POA: Diagnosis not present

## 2018-11-13 DIAGNOSIS — N184 Chronic kidney disease, stage 4 (severe): Secondary | ICD-10-CM | POA: Diagnosis not present

## 2018-11-13 DIAGNOSIS — I350 Nonrheumatic aortic (valve) stenosis: Secondary | ICD-10-CM | POA: Diagnosis not present

## 2018-11-15 DIAGNOSIS — I130 Hypertensive heart and chronic kidney disease with heart failure and stage 1 through stage 4 chronic kidney disease, or unspecified chronic kidney disease: Secondary | ICD-10-CM | POA: Diagnosis not present

## 2018-11-15 DIAGNOSIS — D631 Anemia in chronic kidney disease: Secondary | ICD-10-CM | POA: Diagnosis not present

## 2018-11-15 DIAGNOSIS — E1122 Type 2 diabetes mellitus with diabetic chronic kidney disease: Secondary | ICD-10-CM | POA: Diagnosis not present

## 2018-11-15 DIAGNOSIS — I509 Heart failure, unspecified: Secondary | ICD-10-CM | POA: Diagnosis not present

## 2018-11-15 DIAGNOSIS — N184 Chronic kidney disease, stage 4 (severe): Secondary | ICD-10-CM | POA: Diagnosis not present

## 2018-11-15 DIAGNOSIS — I350 Nonrheumatic aortic (valve) stenosis: Secondary | ICD-10-CM | POA: Diagnosis not present

## 2018-11-21 DIAGNOSIS — I350 Nonrheumatic aortic (valve) stenosis: Secondary | ICD-10-CM | POA: Diagnosis not present

## 2018-11-21 DIAGNOSIS — I509 Heart failure, unspecified: Secondary | ICD-10-CM | POA: Diagnosis not present

## 2018-11-21 DIAGNOSIS — I130 Hypertensive heart and chronic kidney disease with heart failure and stage 1 through stage 4 chronic kidney disease, or unspecified chronic kidney disease: Secondary | ICD-10-CM | POA: Diagnosis not present

## 2018-11-21 DIAGNOSIS — D631 Anemia in chronic kidney disease: Secondary | ICD-10-CM | POA: Diagnosis not present

## 2018-11-21 DIAGNOSIS — N184 Chronic kidney disease, stage 4 (severe): Secondary | ICD-10-CM | POA: Diagnosis not present

## 2018-11-21 DIAGNOSIS — E1122 Type 2 diabetes mellitus with diabetic chronic kidney disease: Secondary | ICD-10-CM | POA: Diagnosis not present

## 2018-12-01 DIAGNOSIS — I509 Heart failure, unspecified: Secondary | ICD-10-CM | POA: Diagnosis not present

## 2018-12-01 DIAGNOSIS — I350 Nonrheumatic aortic (valve) stenosis: Secondary | ICD-10-CM | POA: Diagnosis not present

## 2018-12-01 DIAGNOSIS — D631 Anemia in chronic kidney disease: Secondary | ICD-10-CM | POA: Diagnosis not present

## 2018-12-01 DIAGNOSIS — E1122 Type 2 diabetes mellitus with diabetic chronic kidney disease: Secondary | ICD-10-CM | POA: Diagnosis not present

## 2018-12-01 DIAGNOSIS — I130 Hypertensive heart and chronic kidney disease with heart failure and stage 1 through stage 4 chronic kidney disease, or unspecified chronic kidney disease: Secondary | ICD-10-CM | POA: Diagnosis not present

## 2018-12-01 DIAGNOSIS — N184 Chronic kidney disease, stage 4 (severe): Secondary | ICD-10-CM | POA: Diagnosis not present

## 2019-01-05 DIAGNOSIS — R159 Full incontinence of feces: Secondary | ICD-10-CM | POA: Diagnosis not present

## 2019-01-05 DIAGNOSIS — E785 Hyperlipidemia, unspecified: Secondary | ICD-10-CM | POA: Diagnosis not present

## 2019-01-05 DIAGNOSIS — I130 Hypertensive heart and chronic kidney disease with heart failure and stage 1 through stage 4 chronic kidney disease, or unspecified chronic kidney disease: Secondary | ICD-10-CM | POA: Diagnosis not present

## 2019-01-05 DIAGNOSIS — I679 Cerebrovascular disease, unspecified: Secondary | ICD-10-CM | POA: Diagnosis not present

## 2019-01-05 DIAGNOSIS — I350 Nonrheumatic aortic (valve) stenosis: Secondary | ICD-10-CM | POA: Diagnosis not present

## 2019-01-05 DIAGNOSIS — I252 Old myocardial infarction: Secondary | ICD-10-CM | POA: Diagnosis not present

## 2019-01-05 DIAGNOSIS — G40909 Epilepsy, unspecified, not intractable, without status epilepticus: Secondary | ICD-10-CM | POA: Diagnosis not present

## 2019-01-05 DIAGNOSIS — Z9981 Dependence on supplemental oxygen: Secondary | ICD-10-CM | POA: Diagnosis not present

## 2019-01-05 DIAGNOSIS — D631 Anemia in chronic kidney disease: Secondary | ICD-10-CM | POA: Diagnosis not present

## 2019-01-05 DIAGNOSIS — N184 Chronic kidney disease, stage 4 (severe): Secondary | ICD-10-CM | POA: Diagnosis not present

## 2019-01-05 DIAGNOSIS — R32 Unspecified urinary incontinence: Secondary | ICD-10-CM | POA: Diagnosis not present

## 2019-01-05 DIAGNOSIS — G43909 Migraine, unspecified, not intractable, without status migrainosus: Secondary | ICD-10-CM | POA: Diagnosis not present

## 2019-01-05 DIAGNOSIS — F419 Anxiety disorder, unspecified: Secondary | ICD-10-CM | POA: Diagnosis not present

## 2019-01-05 DIAGNOSIS — F329 Major depressive disorder, single episode, unspecified: Secondary | ICD-10-CM | POA: Diagnosis not present

## 2019-01-05 DIAGNOSIS — E1122 Type 2 diabetes mellitus with diabetic chronic kidney disease: Secondary | ICD-10-CM | POA: Diagnosis not present

## 2019-01-05 DIAGNOSIS — I25119 Atherosclerotic heart disease of native coronary artery with unspecified angina pectoris: Secondary | ICD-10-CM | POA: Diagnosis not present

## 2019-01-05 DIAGNOSIS — I509 Heart failure, unspecified: Secondary | ICD-10-CM | POA: Diagnosis not present

## 2019-01-06 DIAGNOSIS — I130 Hypertensive heart and chronic kidney disease with heart failure and stage 1 through stage 4 chronic kidney disease, or unspecified chronic kidney disease: Secondary | ICD-10-CM | POA: Diagnosis not present

## 2019-01-06 DIAGNOSIS — D631 Anemia in chronic kidney disease: Secondary | ICD-10-CM | POA: Diagnosis not present

## 2019-01-06 DIAGNOSIS — E1122 Type 2 diabetes mellitus with diabetic chronic kidney disease: Secondary | ICD-10-CM | POA: Diagnosis not present

## 2019-01-06 DIAGNOSIS — I350 Nonrheumatic aortic (valve) stenosis: Secondary | ICD-10-CM | POA: Diagnosis not present

## 2019-01-06 DIAGNOSIS — N184 Chronic kidney disease, stage 4 (severe): Secondary | ICD-10-CM | POA: Diagnosis not present

## 2019-01-06 DIAGNOSIS — I509 Heart failure, unspecified: Secondary | ICD-10-CM | POA: Diagnosis not present

## 2019-01-09 DIAGNOSIS — E1122 Type 2 diabetes mellitus with diabetic chronic kidney disease: Secondary | ICD-10-CM | POA: Diagnosis not present

## 2019-01-09 DIAGNOSIS — D631 Anemia in chronic kidney disease: Secondary | ICD-10-CM | POA: Diagnosis not present

## 2019-01-09 DIAGNOSIS — I350 Nonrheumatic aortic (valve) stenosis: Secondary | ICD-10-CM | POA: Diagnosis not present

## 2019-01-09 DIAGNOSIS — I130 Hypertensive heart and chronic kidney disease with heart failure and stage 1 through stage 4 chronic kidney disease, or unspecified chronic kidney disease: Secondary | ICD-10-CM | POA: Diagnosis not present

## 2019-01-09 DIAGNOSIS — I509 Heart failure, unspecified: Secondary | ICD-10-CM | POA: Diagnosis not present

## 2019-01-09 DIAGNOSIS — N184 Chronic kidney disease, stage 4 (severe): Secondary | ICD-10-CM | POA: Diagnosis not present

## 2019-01-10 DIAGNOSIS — D631 Anemia in chronic kidney disease: Secondary | ICD-10-CM | POA: Diagnosis not present

## 2019-01-10 DIAGNOSIS — I509 Heart failure, unspecified: Secondary | ICD-10-CM | POA: Diagnosis not present

## 2019-01-10 DIAGNOSIS — E1122 Type 2 diabetes mellitus with diabetic chronic kidney disease: Secondary | ICD-10-CM | POA: Diagnosis not present

## 2019-01-10 DIAGNOSIS — N184 Chronic kidney disease, stage 4 (severe): Secondary | ICD-10-CM | POA: Diagnosis not present

## 2019-01-10 DIAGNOSIS — I350 Nonrheumatic aortic (valve) stenosis: Secondary | ICD-10-CM | POA: Diagnosis not present

## 2019-01-10 DIAGNOSIS — I130 Hypertensive heart and chronic kidney disease with heart failure and stage 1 through stage 4 chronic kidney disease, or unspecified chronic kidney disease: Secondary | ICD-10-CM | POA: Diagnosis not present

## 2019-01-11 DIAGNOSIS — E1122 Type 2 diabetes mellitus with diabetic chronic kidney disease: Secondary | ICD-10-CM | POA: Diagnosis not present

## 2019-01-11 DIAGNOSIS — I350 Nonrheumatic aortic (valve) stenosis: Secondary | ICD-10-CM | POA: Diagnosis not present

## 2019-01-11 DIAGNOSIS — D631 Anemia in chronic kidney disease: Secondary | ICD-10-CM | POA: Diagnosis not present

## 2019-01-11 DIAGNOSIS — I509 Heart failure, unspecified: Secondary | ICD-10-CM | POA: Diagnosis not present

## 2019-01-11 DIAGNOSIS — N184 Chronic kidney disease, stage 4 (severe): Secondary | ICD-10-CM | POA: Diagnosis not present

## 2019-01-11 DIAGNOSIS — I130 Hypertensive heart and chronic kidney disease with heart failure and stage 1 through stage 4 chronic kidney disease, or unspecified chronic kidney disease: Secondary | ICD-10-CM | POA: Diagnosis not present

## 2019-01-16 DIAGNOSIS — E1122 Type 2 diabetes mellitus with diabetic chronic kidney disease: Secondary | ICD-10-CM | POA: Diagnosis not present

## 2019-01-16 DIAGNOSIS — N184 Chronic kidney disease, stage 4 (severe): Secondary | ICD-10-CM | POA: Diagnosis not present

## 2019-01-16 DIAGNOSIS — D631 Anemia in chronic kidney disease: Secondary | ICD-10-CM | POA: Diagnosis not present

## 2019-01-16 DIAGNOSIS — I350 Nonrheumatic aortic (valve) stenosis: Secondary | ICD-10-CM | POA: Diagnosis not present

## 2019-01-16 DIAGNOSIS — I509 Heart failure, unspecified: Secondary | ICD-10-CM | POA: Diagnosis not present

## 2019-01-16 DIAGNOSIS — I130 Hypertensive heart and chronic kidney disease with heart failure and stage 1 through stage 4 chronic kidney disease, or unspecified chronic kidney disease: Secondary | ICD-10-CM | POA: Diagnosis not present

## 2019-01-18 DIAGNOSIS — D631 Anemia in chronic kidney disease: Secondary | ICD-10-CM | POA: Diagnosis not present

## 2019-01-18 DIAGNOSIS — I350 Nonrheumatic aortic (valve) stenosis: Secondary | ICD-10-CM | POA: Diagnosis not present

## 2019-01-18 DIAGNOSIS — E1122 Type 2 diabetes mellitus with diabetic chronic kidney disease: Secondary | ICD-10-CM | POA: Diagnosis not present

## 2019-01-18 DIAGNOSIS — I509 Heart failure, unspecified: Secondary | ICD-10-CM | POA: Diagnosis not present

## 2019-01-18 DIAGNOSIS — N184 Chronic kidney disease, stage 4 (severe): Secondary | ICD-10-CM | POA: Diagnosis not present

## 2019-01-18 DIAGNOSIS — I130 Hypertensive heart and chronic kidney disease with heart failure and stage 1 through stage 4 chronic kidney disease, or unspecified chronic kidney disease: Secondary | ICD-10-CM | POA: Diagnosis not present

## 2019-01-19 DIAGNOSIS — N184 Chronic kidney disease, stage 4 (severe): Secondary | ICD-10-CM | POA: Diagnosis not present

## 2019-01-19 DIAGNOSIS — I509 Heart failure, unspecified: Secondary | ICD-10-CM | POA: Diagnosis not present

## 2019-01-19 DIAGNOSIS — D631 Anemia in chronic kidney disease: Secondary | ICD-10-CM | POA: Diagnosis not present

## 2019-01-19 DIAGNOSIS — I350 Nonrheumatic aortic (valve) stenosis: Secondary | ICD-10-CM | POA: Diagnosis not present

## 2019-01-19 DIAGNOSIS — I130 Hypertensive heart and chronic kidney disease with heart failure and stage 1 through stage 4 chronic kidney disease, or unspecified chronic kidney disease: Secondary | ICD-10-CM | POA: Diagnosis not present

## 2019-01-19 DIAGNOSIS — E1122 Type 2 diabetes mellitus with diabetic chronic kidney disease: Secondary | ICD-10-CM | POA: Diagnosis not present

## 2019-01-23 DIAGNOSIS — D631 Anemia in chronic kidney disease: Secondary | ICD-10-CM | POA: Diagnosis not present

## 2019-01-23 DIAGNOSIS — I130 Hypertensive heart and chronic kidney disease with heart failure and stage 1 through stage 4 chronic kidney disease, or unspecified chronic kidney disease: Secondary | ICD-10-CM | POA: Diagnosis not present

## 2019-01-23 DIAGNOSIS — I509 Heart failure, unspecified: Secondary | ICD-10-CM | POA: Diagnosis not present

## 2019-01-23 DIAGNOSIS — N184 Chronic kidney disease, stage 4 (severe): Secondary | ICD-10-CM | POA: Diagnosis not present

## 2019-01-23 DIAGNOSIS — E1122 Type 2 diabetes mellitus with diabetic chronic kidney disease: Secondary | ICD-10-CM | POA: Diagnosis not present

## 2019-01-23 DIAGNOSIS — I350 Nonrheumatic aortic (valve) stenosis: Secondary | ICD-10-CM | POA: Diagnosis not present

## 2019-01-25 DIAGNOSIS — E1122 Type 2 diabetes mellitus with diabetic chronic kidney disease: Secondary | ICD-10-CM | POA: Diagnosis not present

## 2019-01-25 DIAGNOSIS — D631 Anemia in chronic kidney disease: Secondary | ICD-10-CM | POA: Diagnosis not present

## 2019-01-25 DIAGNOSIS — I130 Hypertensive heart and chronic kidney disease with heart failure and stage 1 through stage 4 chronic kidney disease, or unspecified chronic kidney disease: Secondary | ICD-10-CM | POA: Diagnosis not present

## 2019-01-25 DIAGNOSIS — I350 Nonrheumatic aortic (valve) stenosis: Secondary | ICD-10-CM | POA: Diagnosis not present

## 2019-01-25 DIAGNOSIS — N184 Chronic kidney disease, stage 4 (severe): Secondary | ICD-10-CM | POA: Diagnosis not present

## 2019-01-25 DIAGNOSIS — I509 Heart failure, unspecified: Secondary | ICD-10-CM | POA: Diagnosis not present

## 2019-01-29 DIAGNOSIS — D631 Anemia in chronic kidney disease: Secondary | ICD-10-CM | POA: Diagnosis not present

## 2019-01-29 DIAGNOSIS — I350 Nonrheumatic aortic (valve) stenosis: Secondary | ICD-10-CM | POA: Diagnosis not present

## 2019-01-29 DIAGNOSIS — N184 Chronic kidney disease, stage 4 (severe): Secondary | ICD-10-CM | POA: Diagnosis not present

## 2019-01-29 DIAGNOSIS — I509 Heart failure, unspecified: Secondary | ICD-10-CM | POA: Diagnosis not present

## 2019-01-29 DIAGNOSIS — E1122 Type 2 diabetes mellitus with diabetic chronic kidney disease: Secondary | ICD-10-CM | POA: Diagnosis not present

## 2019-01-29 DIAGNOSIS — I130 Hypertensive heart and chronic kidney disease with heart failure and stage 1 through stage 4 chronic kidney disease, or unspecified chronic kidney disease: Secondary | ICD-10-CM | POA: Diagnosis not present

## 2019-01-30 DIAGNOSIS — N184 Chronic kidney disease, stage 4 (severe): Secondary | ICD-10-CM | POA: Diagnosis not present

## 2019-01-30 DIAGNOSIS — I509 Heart failure, unspecified: Secondary | ICD-10-CM | POA: Diagnosis not present

## 2019-01-30 DIAGNOSIS — E1122 Type 2 diabetes mellitus with diabetic chronic kidney disease: Secondary | ICD-10-CM | POA: Diagnosis not present

## 2019-01-30 DIAGNOSIS — I350 Nonrheumatic aortic (valve) stenosis: Secondary | ICD-10-CM | POA: Diagnosis not present

## 2019-01-30 DIAGNOSIS — I130 Hypertensive heart and chronic kidney disease with heart failure and stage 1 through stage 4 chronic kidney disease, or unspecified chronic kidney disease: Secondary | ICD-10-CM | POA: Diagnosis not present

## 2019-01-30 DIAGNOSIS — D631 Anemia in chronic kidney disease: Secondary | ICD-10-CM | POA: Diagnosis not present

## 2019-02-01 DIAGNOSIS — I130 Hypertensive heart and chronic kidney disease with heart failure and stage 1 through stage 4 chronic kidney disease, or unspecified chronic kidney disease: Secondary | ICD-10-CM | POA: Diagnosis not present

## 2019-02-01 DIAGNOSIS — I350 Nonrheumatic aortic (valve) stenosis: Secondary | ICD-10-CM | POA: Diagnosis not present

## 2019-02-01 DIAGNOSIS — I509 Heart failure, unspecified: Secondary | ICD-10-CM | POA: Diagnosis not present

## 2019-02-01 DIAGNOSIS — N184 Chronic kidney disease, stage 4 (severe): Secondary | ICD-10-CM | POA: Diagnosis not present

## 2019-02-01 DIAGNOSIS — D631 Anemia in chronic kidney disease: Secondary | ICD-10-CM | POA: Diagnosis not present

## 2019-02-01 DIAGNOSIS — E1122 Type 2 diabetes mellitus with diabetic chronic kidney disease: Secondary | ICD-10-CM | POA: Diagnosis not present

## 2019-02-03 DIAGNOSIS — F419 Anxiety disorder, unspecified: Secondary | ICD-10-CM | POA: Diagnosis not present

## 2019-02-03 DIAGNOSIS — I10 Essential (primary) hypertension: Secondary | ICD-10-CM | POA: Diagnosis not present

## 2019-02-03 DIAGNOSIS — Z7901 Long term (current) use of anticoagulants: Secondary | ICD-10-CM | POA: Diagnosis not present

## 2019-02-03 DIAGNOSIS — Z8673 Personal history of transient ischemic attack (TIA), and cerebral infarction without residual deficits: Secondary | ICD-10-CM | POA: Diagnosis not present

## 2019-02-03 DIAGNOSIS — J9611 Chronic respiratory failure with hypoxia: Secondary | ICD-10-CM | POA: Diagnosis not present

## 2019-02-05 DIAGNOSIS — E1122 Type 2 diabetes mellitus with diabetic chronic kidney disease: Secondary | ICD-10-CM | POA: Diagnosis not present

## 2019-02-05 DIAGNOSIS — I25119 Atherosclerotic heart disease of native coronary artery with unspecified angina pectoris: Secondary | ICD-10-CM | POA: Diagnosis not present

## 2019-02-05 DIAGNOSIS — F329 Major depressive disorder, single episode, unspecified: Secondary | ICD-10-CM | POA: Diagnosis not present

## 2019-02-05 DIAGNOSIS — N184 Chronic kidney disease, stage 4 (severe): Secondary | ICD-10-CM | POA: Diagnosis not present

## 2019-02-05 DIAGNOSIS — I252 Old myocardial infarction: Secondary | ICD-10-CM | POA: Diagnosis not present

## 2019-02-05 DIAGNOSIS — I509 Heart failure, unspecified: Secondary | ICD-10-CM | POA: Diagnosis not present

## 2019-02-05 DIAGNOSIS — G40909 Epilepsy, unspecified, not intractable, without status epilepticus: Secondary | ICD-10-CM | POA: Diagnosis not present

## 2019-02-05 DIAGNOSIS — I130 Hypertensive heart and chronic kidney disease with heart failure and stage 1 through stage 4 chronic kidney disease, or unspecified chronic kidney disease: Secondary | ICD-10-CM | POA: Diagnosis not present

## 2019-02-05 DIAGNOSIS — Z7401 Bed confinement status: Secondary | ICD-10-CM | POA: Diagnosis not present

## 2019-02-05 DIAGNOSIS — F419 Anxiety disorder, unspecified: Secondary | ICD-10-CM | POA: Diagnosis not present

## 2019-02-05 DIAGNOSIS — E785 Hyperlipidemia, unspecified: Secondary | ICD-10-CM | POA: Diagnosis not present

## 2019-02-05 DIAGNOSIS — R159 Full incontinence of feces: Secondary | ICD-10-CM | POA: Diagnosis not present

## 2019-02-05 DIAGNOSIS — R32 Unspecified urinary incontinence: Secondary | ICD-10-CM | POA: Diagnosis not present

## 2019-02-05 DIAGNOSIS — I350 Nonrheumatic aortic (valve) stenosis: Secondary | ICD-10-CM | POA: Diagnosis not present

## 2019-02-05 DIAGNOSIS — S90822D Blister (nonthermal), left foot, subsequent encounter: Secondary | ICD-10-CM | POA: Diagnosis not present

## 2019-02-05 DIAGNOSIS — I679 Cerebrovascular disease, unspecified: Secondary | ICD-10-CM | POA: Diagnosis not present

## 2019-02-05 DIAGNOSIS — D631 Anemia in chronic kidney disease: Secondary | ICD-10-CM | POA: Diagnosis not present

## 2019-02-05 DIAGNOSIS — G43909 Migraine, unspecified, not intractable, without status migrainosus: Secondary | ICD-10-CM | POA: Diagnosis not present

## 2019-02-05 DIAGNOSIS — Z9981 Dependence on supplemental oxygen: Secondary | ICD-10-CM | POA: Diagnosis not present

## 2019-02-06 DIAGNOSIS — I509 Heart failure, unspecified: Secondary | ICD-10-CM | POA: Diagnosis not present

## 2019-02-06 DIAGNOSIS — D631 Anemia in chronic kidney disease: Secondary | ICD-10-CM | POA: Diagnosis not present

## 2019-02-06 DIAGNOSIS — E1122 Type 2 diabetes mellitus with diabetic chronic kidney disease: Secondary | ICD-10-CM | POA: Diagnosis not present

## 2019-02-06 DIAGNOSIS — N184 Chronic kidney disease, stage 4 (severe): Secondary | ICD-10-CM | POA: Diagnosis not present

## 2019-02-06 DIAGNOSIS — I350 Nonrheumatic aortic (valve) stenosis: Secondary | ICD-10-CM | POA: Diagnosis not present

## 2019-02-06 DIAGNOSIS — I130 Hypertensive heart and chronic kidney disease with heart failure and stage 1 through stage 4 chronic kidney disease, or unspecified chronic kidney disease: Secondary | ICD-10-CM | POA: Diagnosis not present

## 2019-02-07 DIAGNOSIS — I509 Heart failure, unspecified: Secondary | ICD-10-CM | POA: Diagnosis not present

## 2019-02-07 DIAGNOSIS — D631 Anemia in chronic kidney disease: Secondary | ICD-10-CM | POA: Diagnosis not present

## 2019-02-07 DIAGNOSIS — I350 Nonrheumatic aortic (valve) stenosis: Secondary | ICD-10-CM | POA: Diagnosis not present

## 2019-02-07 DIAGNOSIS — N184 Chronic kidney disease, stage 4 (severe): Secondary | ICD-10-CM | POA: Diagnosis not present

## 2019-02-07 DIAGNOSIS — E1122 Type 2 diabetes mellitus with diabetic chronic kidney disease: Secondary | ICD-10-CM | POA: Diagnosis not present

## 2019-02-07 DIAGNOSIS — I130 Hypertensive heart and chronic kidney disease with heart failure and stage 1 through stage 4 chronic kidney disease, or unspecified chronic kidney disease: Secondary | ICD-10-CM | POA: Diagnosis not present

## 2019-02-08 DIAGNOSIS — I509 Heart failure, unspecified: Secondary | ICD-10-CM | POA: Diagnosis not present

## 2019-02-08 DIAGNOSIS — E1122 Type 2 diabetes mellitus with diabetic chronic kidney disease: Secondary | ICD-10-CM | POA: Diagnosis not present

## 2019-02-08 DIAGNOSIS — N184 Chronic kidney disease, stage 4 (severe): Secondary | ICD-10-CM | POA: Diagnosis not present

## 2019-02-08 DIAGNOSIS — I130 Hypertensive heart and chronic kidney disease with heart failure and stage 1 through stage 4 chronic kidney disease, or unspecified chronic kidney disease: Secondary | ICD-10-CM | POA: Diagnosis not present

## 2019-02-08 DIAGNOSIS — D631 Anemia in chronic kidney disease: Secondary | ICD-10-CM | POA: Diagnosis not present

## 2019-02-08 DIAGNOSIS — I350 Nonrheumatic aortic (valve) stenosis: Secondary | ICD-10-CM | POA: Diagnosis not present

## 2019-02-13 DIAGNOSIS — E1122 Type 2 diabetes mellitus with diabetic chronic kidney disease: Secondary | ICD-10-CM | POA: Diagnosis not present

## 2019-02-13 DIAGNOSIS — D631 Anemia in chronic kidney disease: Secondary | ICD-10-CM | POA: Diagnosis not present

## 2019-02-13 DIAGNOSIS — I130 Hypertensive heart and chronic kidney disease with heart failure and stage 1 through stage 4 chronic kidney disease, or unspecified chronic kidney disease: Secondary | ICD-10-CM | POA: Diagnosis not present

## 2019-02-13 DIAGNOSIS — I350 Nonrheumatic aortic (valve) stenosis: Secondary | ICD-10-CM | POA: Diagnosis not present

## 2019-02-13 DIAGNOSIS — I509 Heart failure, unspecified: Secondary | ICD-10-CM | POA: Diagnosis not present

## 2019-02-13 DIAGNOSIS — N184 Chronic kidney disease, stage 4 (severe): Secondary | ICD-10-CM | POA: Diagnosis not present

## 2019-02-15 DIAGNOSIS — D631 Anemia in chronic kidney disease: Secondary | ICD-10-CM | POA: Diagnosis not present

## 2019-02-15 DIAGNOSIS — E1122 Type 2 diabetes mellitus with diabetic chronic kidney disease: Secondary | ICD-10-CM | POA: Diagnosis not present

## 2019-02-15 DIAGNOSIS — N184 Chronic kidney disease, stage 4 (severe): Secondary | ICD-10-CM | POA: Diagnosis not present

## 2019-02-15 DIAGNOSIS — I350 Nonrheumatic aortic (valve) stenosis: Secondary | ICD-10-CM | POA: Diagnosis not present

## 2019-02-15 DIAGNOSIS — I130 Hypertensive heart and chronic kidney disease with heart failure and stage 1 through stage 4 chronic kidney disease, or unspecified chronic kidney disease: Secondary | ICD-10-CM | POA: Diagnosis not present

## 2019-02-15 DIAGNOSIS — I509 Heart failure, unspecified: Secondary | ICD-10-CM | POA: Diagnosis not present

## 2019-02-19 DIAGNOSIS — D631 Anemia in chronic kidney disease: Secondary | ICD-10-CM | POA: Diagnosis not present

## 2019-02-19 DIAGNOSIS — E1122 Type 2 diabetes mellitus with diabetic chronic kidney disease: Secondary | ICD-10-CM | POA: Diagnosis not present

## 2019-02-19 DIAGNOSIS — N184 Chronic kidney disease, stage 4 (severe): Secondary | ICD-10-CM | POA: Diagnosis not present

## 2019-02-19 DIAGNOSIS — I509 Heart failure, unspecified: Secondary | ICD-10-CM | POA: Diagnosis not present

## 2019-02-19 DIAGNOSIS — I350 Nonrheumatic aortic (valve) stenosis: Secondary | ICD-10-CM | POA: Diagnosis not present

## 2019-02-19 DIAGNOSIS — I130 Hypertensive heart and chronic kidney disease with heart failure and stage 1 through stage 4 chronic kidney disease, or unspecified chronic kidney disease: Secondary | ICD-10-CM | POA: Diagnosis not present

## 2019-02-20 DIAGNOSIS — D631 Anemia in chronic kidney disease: Secondary | ICD-10-CM | POA: Diagnosis not present

## 2019-02-20 DIAGNOSIS — E1122 Type 2 diabetes mellitus with diabetic chronic kidney disease: Secondary | ICD-10-CM | POA: Diagnosis not present

## 2019-02-20 DIAGNOSIS — I509 Heart failure, unspecified: Secondary | ICD-10-CM | POA: Diagnosis not present

## 2019-02-20 DIAGNOSIS — I350 Nonrheumatic aortic (valve) stenosis: Secondary | ICD-10-CM | POA: Diagnosis not present

## 2019-02-20 DIAGNOSIS — N184 Chronic kidney disease, stage 4 (severe): Secondary | ICD-10-CM | POA: Diagnosis not present

## 2019-02-20 DIAGNOSIS — I130 Hypertensive heart and chronic kidney disease with heart failure and stage 1 through stage 4 chronic kidney disease, or unspecified chronic kidney disease: Secondary | ICD-10-CM | POA: Diagnosis not present

## 2019-02-25 DIAGNOSIS — D631 Anemia in chronic kidney disease: Secondary | ICD-10-CM | POA: Diagnosis not present

## 2019-02-25 DIAGNOSIS — E1122 Type 2 diabetes mellitus with diabetic chronic kidney disease: Secondary | ICD-10-CM | POA: Diagnosis not present

## 2019-02-25 DIAGNOSIS — I350 Nonrheumatic aortic (valve) stenosis: Secondary | ICD-10-CM | POA: Diagnosis not present

## 2019-02-25 DIAGNOSIS — N184 Chronic kidney disease, stage 4 (severe): Secondary | ICD-10-CM | POA: Diagnosis not present

## 2019-02-25 DIAGNOSIS — I130 Hypertensive heart and chronic kidney disease with heart failure and stage 1 through stage 4 chronic kidney disease, or unspecified chronic kidney disease: Secondary | ICD-10-CM | POA: Diagnosis not present

## 2019-02-25 DIAGNOSIS — I509 Heart failure, unspecified: Secondary | ICD-10-CM | POA: Diagnosis not present

## 2019-02-26 DIAGNOSIS — I130 Hypertensive heart and chronic kidney disease with heart failure and stage 1 through stage 4 chronic kidney disease, or unspecified chronic kidney disease: Secondary | ICD-10-CM | POA: Diagnosis not present

## 2019-02-26 DIAGNOSIS — I350 Nonrheumatic aortic (valve) stenosis: Secondary | ICD-10-CM | POA: Diagnosis not present

## 2019-02-26 DIAGNOSIS — E1122 Type 2 diabetes mellitus with diabetic chronic kidney disease: Secondary | ICD-10-CM | POA: Diagnosis not present

## 2019-02-26 DIAGNOSIS — D631 Anemia in chronic kidney disease: Secondary | ICD-10-CM | POA: Diagnosis not present

## 2019-02-26 DIAGNOSIS — I509 Heart failure, unspecified: Secondary | ICD-10-CM | POA: Diagnosis not present

## 2019-02-26 DIAGNOSIS — N184 Chronic kidney disease, stage 4 (severe): Secondary | ICD-10-CM | POA: Diagnosis not present

## 2019-02-27 DIAGNOSIS — I130 Hypertensive heart and chronic kidney disease with heart failure and stage 1 through stage 4 chronic kidney disease, or unspecified chronic kidney disease: Secondary | ICD-10-CM | POA: Diagnosis not present

## 2019-02-27 DIAGNOSIS — E1122 Type 2 diabetes mellitus with diabetic chronic kidney disease: Secondary | ICD-10-CM | POA: Diagnosis not present

## 2019-02-27 DIAGNOSIS — I350 Nonrheumatic aortic (valve) stenosis: Secondary | ICD-10-CM | POA: Diagnosis not present

## 2019-02-27 DIAGNOSIS — N184 Chronic kidney disease, stage 4 (severe): Secondary | ICD-10-CM | POA: Diagnosis not present

## 2019-02-27 DIAGNOSIS — I509 Heart failure, unspecified: Secondary | ICD-10-CM | POA: Diagnosis not present

## 2019-02-27 DIAGNOSIS — D631 Anemia in chronic kidney disease: Secondary | ICD-10-CM | POA: Diagnosis not present

## 2019-03-01 DIAGNOSIS — D631 Anemia in chronic kidney disease: Secondary | ICD-10-CM | POA: Diagnosis not present

## 2019-03-01 DIAGNOSIS — I350 Nonrheumatic aortic (valve) stenosis: Secondary | ICD-10-CM | POA: Diagnosis not present

## 2019-03-01 DIAGNOSIS — E1122 Type 2 diabetes mellitus with diabetic chronic kidney disease: Secondary | ICD-10-CM | POA: Diagnosis not present

## 2019-03-01 DIAGNOSIS — I509 Heart failure, unspecified: Secondary | ICD-10-CM | POA: Diagnosis not present

## 2019-03-01 DIAGNOSIS — I130 Hypertensive heart and chronic kidney disease with heart failure and stage 1 through stage 4 chronic kidney disease, or unspecified chronic kidney disease: Secondary | ICD-10-CM | POA: Diagnosis not present

## 2019-03-01 DIAGNOSIS — N184 Chronic kidney disease, stage 4 (severe): Secondary | ICD-10-CM | POA: Diagnosis not present

## 2019-03-05 DIAGNOSIS — Z9981 Dependence on supplemental oxygen: Secondary | ICD-10-CM | POA: Diagnosis not present

## 2019-03-05 DIAGNOSIS — S90822D Blister (nonthermal), left foot, subsequent encounter: Secondary | ICD-10-CM | POA: Diagnosis not present

## 2019-03-05 DIAGNOSIS — I25119 Atherosclerotic heart disease of native coronary artery with unspecified angina pectoris: Secondary | ICD-10-CM | POA: Diagnosis not present

## 2019-03-05 DIAGNOSIS — F329 Major depressive disorder, single episode, unspecified: Secondary | ICD-10-CM | POA: Diagnosis not present

## 2019-03-05 DIAGNOSIS — I130 Hypertensive heart and chronic kidney disease with heart failure and stage 1 through stage 4 chronic kidney disease, or unspecified chronic kidney disease: Secondary | ICD-10-CM | POA: Diagnosis not present

## 2019-03-05 DIAGNOSIS — E1122 Type 2 diabetes mellitus with diabetic chronic kidney disease: Secondary | ICD-10-CM | POA: Diagnosis not present

## 2019-03-05 DIAGNOSIS — R32 Unspecified urinary incontinence: Secondary | ICD-10-CM | POA: Diagnosis not present

## 2019-03-05 DIAGNOSIS — I252 Old myocardial infarction: Secondary | ICD-10-CM | POA: Diagnosis not present

## 2019-03-05 DIAGNOSIS — G40909 Epilepsy, unspecified, not intractable, without status epilepticus: Secondary | ICD-10-CM | POA: Diagnosis not present

## 2019-03-05 DIAGNOSIS — F419 Anxiety disorder, unspecified: Secondary | ICD-10-CM | POA: Diagnosis not present

## 2019-03-05 DIAGNOSIS — D631 Anemia in chronic kidney disease: Secondary | ICD-10-CM | POA: Diagnosis not present

## 2019-03-05 DIAGNOSIS — R159 Full incontinence of feces: Secondary | ICD-10-CM | POA: Diagnosis not present

## 2019-03-05 DIAGNOSIS — E785 Hyperlipidemia, unspecified: Secondary | ICD-10-CM | POA: Diagnosis not present

## 2019-03-05 DIAGNOSIS — I679 Cerebrovascular disease, unspecified: Secondary | ICD-10-CM | POA: Diagnosis not present

## 2019-03-05 DIAGNOSIS — N184 Chronic kidney disease, stage 4 (severe): Secondary | ICD-10-CM | POA: Diagnosis not present

## 2019-03-05 DIAGNOSIS — Z7401 Bed confinement status: Secondary | ICD-10-CM | POA: Diagnosis not present

## 2019-03-05 DIAGNOSIS — I350 Nonrheumatic aortic (valve) stenosis: Secondary | ICD-10-CM | POA: Diagnosis not present

## 2019-03-05 DIAGNOSIS — G43909 Migraine, unspecified, not intractable, without status migrainosus: Secondary | ICD-10-CM | POA: Diagnosis not present

## 2019-03-05 DIAGNOSIS — I509 Heart failure, unspecified: Secondary | ICD-10-CM | POA: Diagnosis not present

## 2019-03-06 DIAGNOSIS — E1122 Type 2 diabetes mellitus with diabetic chronic kidney disease: Secondary | ICD-10-CM | POA: Diagnosis not present

## 2019-03-06 DIAGNOSIS — N184 Chronic kidney disease, stage 4 (severe): Secondary | ICD-10-CM | POA: Diagnosis not present

## 2019-03-06 DIAGNOSIS — I130 Hypertensive heart and chronic kidney disease with heart failure and stage 1 through stage 4 chronic kidney disease, or unspecified chronic kidney disease: Secondary | ICD-10-CM | POA: Diagnosis not present

## 2019-03-06 DIAGNOSIS — I350 Nonrheumatic aortic (valve) stenosis: Secondary | ICD-10-CM | POA: Diagnosis not present

## 2019-03-06 DIAGNOSIS — I509 Heart failure, unspecified: Secondary | ICD-10-CM | POA: Diagnosis not present

## 2019-03-06 DIAGNOSIS — D631 Anemia in chronic kidney disease: Secondary | ICD-10-CM | POA: Diagnosis not present

## 2019-03-08 DIAGNOSIS — I509 Heart failure, unspecified: Secondary | ICD-10-CM | POA: Diagnosis not present

## 2019-03-08 DIAGNOSIS — E1122 Type 2 diabetes mellitus with diabetic chronic kidney disease: Secondary | ICD-10-CM | POA: Diagnosis not present

## 2019-03-08 DIAGNOSIS — I350 Nonrheumatic aortic (valve) stenosis: Secondary | ICD-10-CM | POA: Diagnosis not present

## 2019-03-08 DIAGNOSIS — I130 Hypertensive heart and chronic kidney disease with heart failure and stage 1 through stage 4 chronic kidney disease, or unspecified chronic kidney disease: Secondary | ICD-10-CM | POA: Diagnosis not present

## 2019-03-08 DIAGNOSIS — N184 Chronic kidney disease, stage 4 (severe): Secondary | ICD-10-CM | POA: Diagnosis not present

## 2019-03-08 DIAGNOSIS — D631 Anemia in chronic kidney disease: Secondary | ICD-10-CM | POA: Diagnosis not present

## 2019-03-12 DIAGNOSIS — E1122 Type 2 diabetes mellitus with diabetic chronic kidney disease: Secondary | ICD-10-CM | POA: Diagnosis not present

## 2019-03-12 DIAGNOSIS — I509 Heart failure, unspecified: Secondary | ICD-10-CM | POA: Diagnosis not present

## 2019-03-12 DIAGNOSIS — I350 Nonrheumatic aortic (valve) stenosis: Secondary | ICD-10-CM | POA: Diagnosis not present

## 2019-03-12 DIAGNOSIS — D631 Anemia in chronic kidney disease: Secondary | ICD-10-CM | POA: Diagnosis not present

## 2019-03-12 DIAGNOSIS — N184 Chronic kidney disease, stage 4 (severe): Secondary | ICD-10-CM | POA: Diagnosis not present

## 2019-03-12 DIAGNOSIS — I130 Hypertensive heart and chronic kidney disease with heart failure and stage 1 through stage 4 chronic kidney disease, or unspecified chronic kidney disease: Secondary | ICD-10-CM | POA: Diagnosis not present

## 2019-03-13 DIAGNOSIS — I130 Hypertensive heart and chronic kidney disease with heart failure and stage 1 through stage 4 chronic kidney disease, or unspecified chronic kidney disease: Secondary | ICD-10-CM | POA: Diagnosis not present

## 2019-03-13 DIAGNOSIS — N184 Chronic kidney disease, stage 4 (severe): Secondary | ICD-10-CM | POA: Diagnosis not present

## 2019-03-13 DIAGNOSIS — I509 Heart failure, unspecified: Secondary | ICD-10-CM | POA: Diagnosis not present

## 2019-03-13 DIAGNOSIS — D631 Anemia in chronic kidney disease: Secondary | ICD-10-CM | POA: Diagnosis not present

## 2019-03-13 DIAGNOSIS — E1122 Type 2 diabetes mellitus with diabetic chronic kidney disease: Secondary | ICD-10-CM | POA: Diagnosis not present

## 2019-03-13 DIAGNOSIS — I350 Nonrheumatic aortic (valve) stenosis: Secondary | ICD-10-CM | POA: Diagnosis not present

## 2019-03-15 DIAGNOSIS — I130 Hypertensive heart and chronic kidney disease with heart failure and stage 1 through stage 4 chronic kidney disease, or unspecified chronic kidney disease: Secondary | ICD-10-CM | POA: Diagnosis not present

## 2019-03-15 DIAGNOSIS — E1122 Type 2 diabetes mellitus with diabetic chronic kidney disease: Secondary | ICD-10-CM | POA: Diagnosis not present

## 2019-03-15 DIAGNOSIS — I350 Nonrheumatic aortic (valve) stenosis: Secondary | ICD-10-CM | POA: Diagnosis not present

## 2019-03-15 DIAGNOSIS — N184 Chronic kidney disease, stage 4 (severe): Secondary | ICD-10-CM | POA: Diagnosis not present

## 2019-03-15 DIAGNOSIS — D631 Anemia in chronic kidney disease: Secondary | ICD-10-CM | POA: Diagnosis not present

## 2019-03-15 DIAGNOSIS — I509 Heart failure, unspecified: Secondary | ICD-10-CM | POA: Diagnosis not present

## 2019-03-19 DIAGNOSIS — I130 Hypertensive heart and chronic kidney disease with heart failure and stage 1 through stage 4 chronic kidney disease, or unspecified chronic kidney disease: Secondary | ICD-10-CM | POA: Diagnosis not present

## 2019-03-19 DIAGNOSIS — E1122 Type 2 diabetes mellitus with diabetic chronic kidney disease: Secondary | ICD-10-CM | POA: Diagnosis not present

## 2019-03-19 DIAGNOSIS — D631 Anemia in chronic kidney disease: Secondary | ICD-10-CM | POA: Diagnosis not present

## 2019-03-19 DIAGNOSIS — N184 Chronic kidney disease, stage 4 (severe): Secondary | ICD-10-CM | POA: Diagnosis not present

## 2019-03-19 DIAGNOSIS — I509 Heart failure, unspecified: Secondary | ICD-10-CM | POA: Diagnosis not present

## 2019-03-19 DIAGNOSIS — I350 Nonrheumatic aortic (valve) stenosis: Secondary | ICD-10-CM | POA: Diagnosis not present

## 2019-03-20 DIAGNOSIS — D631 Anemia in chronic kidney disease: Secondary | ICD-10-CM | POA: Diagnosis not present

## 2019-03-20 DIAGNOSIS — E1122 Type 2 diabetes mellitus with diabetic chronic kidney disease: Secondary | ICD-10-CM | POA: Diagnosis not present

## 2019-03-20 DIAGNOSIS — I130 Hypertensive heart and chronic kidney disease with heart failure and stage 1 through stage 4 chronic kidney disease, or unspecified chronic kidney disease: Secondary | ICD-10-CM | POA: Diagnosis not present

## 2019-03-20 DIAGNOSIS — N184 Chronic kidney disease, stage 4 (severe): Secondary | ICD-10-CM | POA: Diagnosis not present

## 2019-03-20 DIAGNOSIS — I350 Nonrheumatic aortic (valve) stenosis: Secondary | ICD-10-CM | POA: Diagnosis not present

## 2019-03-20 DIAGNOSIS — I509 Heart failure, unspecified: Secondary | ICD-10-CM | POA: Diagnosis not present

## 2019-03-22 DIAGNOSIS — I350 Nonrheumatic aortic (valve) stenosis: Secondary | ICD-10-CM | POA: Diagnosis not present

## 2019-03-22 DIAGNOSIS — D631 Anemia in chronic kidney disease: Secondary | ICD-10-CM | POA: Diagnosis not present

## 2019-03-22 DIAGNOSIS — E1122 Type 2 diabetes mellitus with diabetic chronic kidney disease: Secondary | ICD-10-CM | POA: Diagnosis not present

## 2019-03-22 DIAGNOSIS — I130 Hypertensive heart and chronic kidney disease with heart failure and stage 1 through stage 4 chronic kidney disease, or unspecified chronic kidney disease: Secondary | ICD-10-CM | POA: Diagnosis not present

## 2019-03-22 DIAGNOSIS — I509 Heart failure, unspecified: Secondary | ICD-10-CM | POA: Diagnosis not present

## 2019-03-22 DIAGNOSIS — N184 Chronic kidney disease, stage 4 (severe): Secondary | ICD-10-CM | POA: Diagnosis not present

## 2019-03-24 DIAGNOSIS — J9611 Chronic respiratory failure with hypoxia: Secondary | ICD-10-CM | POA: Diagnosis not present

## 2019-03-24 DIAGNOSIS — I10 Essential (primary) hypertension: Secondary | ICD-10-CM | POA: Diagnosis not present

## 2019-03-24 DIAGNOSIS — J302 Other seasonal allergic rhinitis: Secondary | ICD-10-CM | POA: Diagnosis not present

## 2019-03-24 DIAGNOSIS — F419 Anxiety disorder, unspecified: Secondary | ICD-10-CM | POA: Diagnosis not present

## 2019-03-24 DIAGNOSIS — G40909 Epilepsy, unspecified, not intractable, without status epilepticus: Secondary | ICD-10-CM | POA: Diagnosis not present

## 2019-03-24 DIAGNOSIS — G894 Chronic pain syndrome: Secondary | ICD-10-CM | POA: Diagnosis not present

## 2019-03-26 DIAGNOSIS — N184 Chronic kidney disease, stage 4 (severe): Secondary | ICD-10-CM | POA: Diagnosis not present

## 2019-03-26 DIAGNOSIS — I130 Hypertensive heart and chronic kidney disease with heart failure and stage 1 through stage 4 chronic kidney disease, or unspecified chronic kidney disease: Secondary | ICD-10-CM | POA: Diagnosis not present

## 2019-03-26 DIAGNOSIS — I509 Heart failure, unspecified: Secondary | ICD-10-CM | POA: Diagnosis not present

## 2019-03-26 DIAGNOSIS — E1122 Type 2 diabetes mellitus with diabetic chronic kidney disease: Secondary | ICD-10-CM | POA: Diagnosis not present

## 2019-03-26 DIAGNOSIS — D631 Anemia in chronic kidney disease: Secondary | ICD-10-CM | POA: Diagnosis not present

## 2019-03-26 DIAGNOSIS — I350 Nonrheumatic aortic (valve) stenosis: Secondary | ICD-10-CM | POA: Diagnosis not present

## 2019-03-27 DIAGNOSIS — N184 Chronic kidney disease, stage 4 (severe): Secondary | ICD-10-CM | POA: Diagnosis not present

## 2019-03-27 DIAGNOSIS — I130 Hypertensive heart and chronic kidney disease with heart failure and stage 1 through stage 4 chronic kidney disease, or unspecified chronic kidney disease: Secondary | ICD-10-CM | POA: Diagnosis not present

## 2019-03-27 DIAGNOSIS — D631 Anemia in chronic kidney disease: Secondary | ICD-10-CM | POA: Diagnosis not present

## 2019-03-27 DIAGNOSIS — I350 Nonrheumatic aortic (valve) stenosis: Secondary | ICD-10-CM | POA: Diagnosis not present

## 2019-03-27 DIAGNOSIS — I509 Heart failure, unspecified: Secondary | ICD-10-CM | POA: Diagnosis not present

## 2019-03-27 DIAGNOSIS — E1122 Type 2 diabetes mellitus with diabetic chronic kidney disease: Secondary | ICD-10-CM | POA: Diagnosis not present

## 2019-03-29 DIAGNOSIS — E1122 Type 2 diabetes mellitus with diabetic chronic kidney disease: Secondary | ICD-10-CM | POA: Diagnosis not present

## 2019-03-29 DIAGNOSIS — I130 Hypertensive heart and chronic kidney disease with heart failure and stage 1 through stage 4 chronic kidney disease, or unspecified chronic kidney disease: Secondary | ICD-10-CM | POA: Diagnosis not present

## 2019-03-29 DIAGNOSIS — I350 Nonrheumatic aortic (valve) stenosis: Secondary | ICD-10-CM | POA: Diagnosis not present

## 2019-03-29 DIAGNOSIS — I509 Heart failure, unspecified: Secondary | ICD-10-CM | POA: Diagnosis not present

## 2019-03-29 DIAGNOSIS — D631 Anemia in chronic kidney disease: Secondary | ICD-10-CM | POA: Diagnosis not present

## 2019-03-29 DIAGNOSIS — N184 Chronic kidney disease, stage 4 (severe): Secondary | ICD-10-CM | POA: Diagnosis not present

## 2019-04-02 DIAGNOSIS — E1122 Type 2 diabetes mellitus with diabetic chronic kidney disease: Secondary | ICD-10-CM | POA: Diagnosis not present

## 2019-04-02 DIAGNOSIS — I130 Hypertensive heart and chronic kidney disease with heart failure and stage 1 through stage 4 chronic kidney disease, or unspecified chronic kidney disease: Secondary | ICD-10-CM | POA: Diagnosis not present

## 2019-04-02 DIAGNOSIS — I350 Nonrheumatic aortic (valve) stenosis: Secondary | ICD-10-CM | POA: Diagnosis not present

## 2019-04-02 DIAGNOSIS — N184 Chronic kidney disease, stage 4 (severe): Secondary | ICD-10-CM | POA: Diagnosis not present

## 2019-04-02 DIAGNOSIS — D631 Anemia in chronic kidney disease: Secondary | ICD-10-CM | POA: Diagnosis not present

## 2019-04-02 DIAGNOSIS — I509 Heart failure, unspecified: Secondary | ICD-10-CM | POA: Diagnosis not present

## 2019-04-03 DIAGNOSIS — I350 Nonrheumatic aortic (valve) stenosis: Secondary | ICD-10-CM | POA: Diagnosis not present

## 2019-04-03 DIAGNOSIS — I509 Heart failure, unspecified: Secondary | ICD-10-CM | POA: Diagnosis not present

## 2019-04-03 DIAGNOSIS — E1122 Type 2 diabetes mellitus with diabetic chronic kidney disease: Secondary | ICD-10-CM | POA: Diagnosis not present

## 2019-04-03 DIAGNOSIS — I130 Hypertensive heart and chronic kidney disease with heart failure and stage 1 through stage 4 chronic kidney disease, or unspecified chronic kidney disease: Secondary | ICD-10-CM | POA: Diagnosis not present

## 2019-04-03 DIAGNOSIS — D631 Anemia in chronic kidney disease: Secondary | ICD-10-CM | POA: Diagnosis not present

## 2019-04-03 DIAGNOSIS — N184 Chronic kidney disease, stage 4 (severe): Secondary | ICD-10-CM | POA: Diagnosis not present

## 2019-04-04 DIAGNOSIS — I130 Hypertensive heart and chronic kidney disease with heart failure and stage 1 through stage 4 chronic kidney disease, or unspecified chronic kidney disease: Secondary | ICD-10-CM | POA: Diagnosis not present

## 2019-04-04 DIAGNOSIS — E1122 Type 2 diabetes mellitus with diabetic chronic kidney disease: Secondary | ICD-10-CM | POA: Diagnosis not present

## 2019-04-04 DIAGNOSIS — N184 Chronic kidney disease, stage 4 (severe): Secondary | ICD-10-CM | POA: Diagnosis not present

## 2019-04-04 DIAGNOSIS — I350 Nonrheumatic aortic (valve) stenosis: Secondary | ICD-10-CM | POA: Diagnosis not present

## 2019-04-04 DIAGNOSIS — I509 Heart failure, unspecified: Secondary | ICD-10-CM | POA: Diagnosis not present

## 2019-04-04 DIAGNOSIS — D631 Anemia in chronic kidney disease: Secondary | ICD-10-CM | POA: Diagnosis not present

## 2019-04-05 DIAGNOSIS — I509 Heart failure, unspecified: Secondary | ICD-10-CM | POA: Diagnosis not present

## 2019-04-05 DIAGNOSIS — I25119 Atherosclerotic heart disease of native coronary artery with unspecified angina pectoris: Secondary | ICD-10-CM | POA: Diagnosis not present

## 2019-04-05 DIAGNOSIS — E1122 Type 2 diabetes mellitus with diabetic chronic kidney disease: Secondary | ICD-10-CM | POA: Diagnosis not present

## 2019-04-05 DIAGNOSIS — S50812D Abrasion of left forearm, subsequent encounter: Secondary | ICD-10-CM | POA: Diagnosis not present

## 2019-04-05 DIAGNOSIS — D631 Anemia in chronic kidney disease: Secondary | ICD-10-CM | POA: Diagnosis not present

## 2019-04-05 DIAGNOSIS — Z7401 Bed confinement status: Secondary | ICD-10-CM | POA: Diagnosis not present

## 2019-04-05 DIAGNOSIS — F419 Anxiety disorder, unspecified: Secondary | ICD-10-CM | POA: Diagnosis not present

## 2019-04-05 DIAGNOSIS — F329 Major depressive disorder, single episode, unspecified: Secondary | ICD-10-CM | POA: Diagnosis not present

## 2019-04-05 DIAGNOSIS — S90822D Blister (nonthermal), left foot, subsequent encounter: Secondary | ICD-10-CM | POA: Diagnosis not present

## 2019-04-05 DIAGNOSIS — R159 Full incontinence of feces: Secondary | ICD-10-CM | POA: Diagnosis not present

## 2019-04-05 DIAGNOSIS — I252 Old myocardial infarction: Secondary | ICD-10-CM | POA: Diagnosis not present

## 2019-04-05 DIAGNOSIS — E785 Hyperlipidemia, unspecified: Secondary | ICD-10-CM | POA: Diagnosis not present

## 2019-04-05 DIAGNOSIS — I679 Cerebrovascular disease, unspecified: Secondary | ICD-10-CM | POA: Diagnosis not present

## 2019-04-05 DIAGNOSIS — Z9981 Dependence on supplemental oxygen: Secondary | ICD-10-CM | POA: Diagnosis not present

## 2019-04-05 DIAGNOSIS — G40909 Epilepsy, unspecified, not intractable, without status epilepticus: Secondary | ICD-10-CM | POA: Diagnosis not present

## 2019-04-05 DIAGNOSIS — I350 Nonrheumatic aortic (valve) stenosis: Secondary | ICD-10-CM | POA: Diagnosis not present

## 2019-04-05 DIAGNOSIS — N184 Chronic kidney disease, stage 4 (severe): Secondary | ICD-10-CM | POA: Diagnosis not present

## 2019-04-05 DIAGNOSIS — I130 Hypertensive heart and chronic kidney disease with heart failure and stage 1 through stage 4 chronic kidney disease, or unspecified chronic kidney disease: Secondary | ICD-10-CM | POA: Diagnosis not present

## 2019-04-05 DIAGNOSIS — G43909 Migraine, unspecified, not intractable, without status migrainosus: Secondary | ICD-10-CM | POA: Diagnosis not present

## 2019-04-05 DIAGNOSIS — R32 Unspecified urinary incontinence: Secondary | ICD-10-CM | POA: Diagnosis not present

## 2019-04-09 DIAGNOSIS — I509 Heart failure, unspecified: Secondary | ICD-10-CM | POA: Diagnosis not present

## 2019-04-09 DIAGNOSIS — I350 Nonrheumatic aortic (valve) stenosis: Secondary | ICD-10-CM | POA: Diagnosis not present

## 2019-04-09 DIAGNOSIS — D631 Anemia in chronic kidney disease: Secondary | ICD-10-CM | POA: Diagnosis not present

## 2019-04-09 DIAGNOSIS — N184 Chronic kidney disease, stage 4 (severe): Secondary | ICD-10-CM | POA: Diagnosis not present

## 2019-04-09 DIAGNOSIS — E1122 Type 2 diabetes mellitus with diabetic chronic kidney disease: Secondary | ICD-10-CM | POA: Diagnosis not present

## 2019-04-09 DIAGNOSIS — I130 Hypertensive heart and chronic kidney disease with heart failure and stage 1 through stage 4 chronic kidney disease, or unspecified chronic kidney disease: Secondary | ICD-10-CM | POA: Diagnosis not present

## 2019-04-10 DIAGNOSIS — E1122 Type 2 diabetes mellitus with diabetic chronic kidney disease: Secondary | ICD-10-CM | POA: Diagnosis not present

## 2019-04-10 DIAGNOSIS — D631 Anemia in chronic kidney disease: Secondary | ICD-10-CM | POA: Diagnosis not present

## 2019-04-10 DIAGNOSIS — I509 Heart failure, unspecified: Secondary | ICD-10-CM | POA: Diagnosis not present

## 2019-04-10 DIAGNOSIS — I130 Hypertensive heart and chronic kidney disease with heart failure and stage 1 through stage 4 chronic kidney disease, or unspecified chronic kidney disease: Secondary | ICD-10-CM | POA: Diagnosis not present

## 2019-04-10 DIAGNOSIS — I350 Nonrheumatic aortic (valve) stenosis: Secondary | ICD-10-CM | POA: Diagnosis not present

## 2019-04-10 DIAGNOSIS — N184 Chronic kidney disease, stage 4 (severe): Secondary | ICD-10-CM | POA: Diagnosis not present

## 2019-04-12 DIAGNOSIS — I350 Nonrheumatic aortic (valve) stenosis: Secondary | ICD-10-CM | POA: Diagnosis not present

## 2019-04-12 DIAGNOSIS — I509 Heart failure, unspecified: Secondary | ICD-10-CM | POA: Diagnosis not present

## 2019-04-12 DIAGNOSIS — D631 Anemia in chronic kidney disease: Secondary | ICD-10-CM | POA: Diagnosis not present

## 2019-04-12 DIAGNOSIS — E1122 Type 2 diabetes mellitus with diabetic chronic kidney disease: Secondary | ICD-10-CM | POA: Diagnosis not present

## 2019-04-12 DIAGNOSIS — N184 Chronic kidney disease, stage 4 (severe): Secondary | ICD-10-CM | POA: Diagnosis not present

## 2019-04-12 DIAGNOSIS — I130 Hypertensive heart and chronic kidney disease with heart failure and stage 1 through stage 4 chronic kidney disease, or unspecified chronic kidney disease: Secondary | ICD-10-CM | POA: Diagnosis not present

## 2019-04-16 DIAGNOSIS — I509 Heart failure, unspecified: Secondary | ICD-10-CM | POA: Diagnosis not present

## 2019-04-16 DIAGNOSIS — I350 Nonrheumatic aortic (valve) stenosis: Secondary | ICD-10-CM | POA: Diagnosis not present

## 2019-04-16 DIAGNOSIS — D631 Anemia in chronic kidney disease: Secondary | ICD-10-CM | POA: Diagnosis not present

## 2019-04-16 DIAGNOSIS — N184 Chronic kidney disease, stage 4 (severe): Secondary | ICD-10-CM | POA: Diagnosis not present

## 2019-04-16 DIAGNOSIS — E1122 Type 2 diabetes mellitus with diabetic chronic kidney disease: Secondary | ICD-10-CM | POA: Diagnosis not present

## 2019-04-16 DIAGNOSIS — I130 Hypertensive heart and chronic kidney disease with heart failure and stage 1 through stage 4 chronic kidney disease, or unspecified chronic kidney disease: Secondary | ICD-10-CM | POA: Diagnosis not present

## 2019-04-17 DIAGNOSIS — N184 Chronic kidney disease, stage 4 (severe): Secondary | ICD-10-CM | POA: Diagnosis not present

## 2019-04-17 DIAGNOSIS — D631 Anemia in chronic kidney disease: Secondary | ICD-10-CM | POA: Diagnosis not present

## 2019-04-17 DIAGNOSIS — E1122 Type 2 diabetes mellitus with diabetic chronic kidney disease: Secondary | ICD-10-CM | POA: Diagnosis not present

## 2019-04-17 DIAGNOSIS — I130 Hypertensive heart and chronic kidney disease with heart failure and stage 1 through stage 4 chronic kidney disease, or unspecified chronic kidney disease: Secondary | ICD-10-CM | POA: Diagnosis not present

## 2019-04-17 DIAGNOSIS — I509 Heart failure, unspecified: Secondary | ICD-10-CM | POA: Diagnosis not present

## 2019-04-17 DIAGNOSIS — I350 Nonrheumatic aortic (valve) stenosis: Secondary | ICD-10-CM | POA: Diagnosis not present

## 2019-04-19 DIAGNOSIS — D631 Anemia in chronic kidney disease: Secondary | ICD-10-CM | POA: Diagnosis not present

## 2019-04-19 DIAGNOSIS — N184 Chronic kidney disease, stage 4 (severe): Secondary | ICD-10-CM | POA: Diagnosis not present

## 2019-04-19 DIAGNOSIS — I350 Nonrheumatic aortic (valve) stenosis: Secondary | ICD-10-CM | POA: Diagnosis not present

## 2019-04-19 DIAGNOSIS — E1122 Type 2 diabetes mellitus with diabetic chronic kidney disease: Secondary | ICD-10-CM | POA: Diagnosis not present

## 2019-04-19 DIAGNOSIS — I130 Hypertensive heart and chronic kidney disease with heart failure and stage 1 through stage 4 chronic kidney disease, or unspecified chronic kidney disease: Secondary | ICD-10-CM | POA: Diagnosis not present

## 2019-04-19 DIAGNOSIS — I509 Heart failure, unspecified: Secondary | ICD-10-CM | POA: Diagnosis not present

## 2019-04-24 DIAGNOSIS — I509 Heart failure, unspecified: Secondary | ICD-10-CM | POA: Diagnosis not present

## 2019-04-24 DIAGNOSIS — I350 Nonrheumatic aortic (valve) stenosis: Secondary | ICD-10-CM | POA: Diagnosis not present

## 2019-04-24 DIAGNOSIS — I130 Hypertensive heart and chronic kidney disease with heart failure and stage 1 through stage 4 chronic kidney disease, or unspecified chronic kidney disease: Secondary | ICD-10-CM | POA: Diagnosis not present

## 2019-04-24 DIAGNOSIS — N184 Chronic kidney disease, stage 4 (severe): Secondary | ICD-10-CM | POA: Diagnosis not present

## 2019-04-24 DIAGNOSIS — D631 Anemia in chronic kidney disease: Secondary | ICD-10-CM | POA: Diagnosis not present

## 2019-04-24 DIAGNOSIS — E1122 Type 2 diabetes mellitus with diabetic chronic kidney disease: Secondary | ICD-10-CM | POA: Diagnosis not present

## 2019-04-26 DIAGNOSIS — D631 Anemia in chronic kidney disease: Secondary | ICD-10-CM | POA: Diagnosis not present

## 2019-04-26 DIAGNOSIS — N184 Chronic kidney disease, stage 4 (severe): Secondary | ICD-10-CM | POA: Diagnosis not present

## 2019-04-26 DIAGNOSIS — I509 Heart failure, unspecified: Secondary | ICD-10-CM | POA: Diagnosis not present

## 2019-04-26 DIAGNOSIS — I350 Nonrheumatic aortic (valve) stenosis: Secondary | ICD-10-CM | POA: Diagnosis not present

## 2019-04-26 DIAGNOSIS — I130 Hypertensive heart and chronic kidney disease with heart failure and stage 1 through stage 4 chronic kidney disease, or unspecified chronic kidney disease: Secondary | ICD-10-CM | POA: Diagnosis not present

## 2019-04-26 DIAGNOSIS — E1122 Type 2 diabetes mellitus with diabetic chronic kidney disease: Secondary | ICD-10-CM | POA: Diagnosis not present

## 2019-04-30 DIAGNOSIS — D631 Anemia in chronic kidney disease: Secondary | ICD-10-CM | POA: Diagnosis not present

## 2019-04-30 DIAGNOSIS — I350 Nonrheumatic aortic (valve) stenosis: Secondary | ICD-10-CM | POA: Diagnosis not present

## 2019-04-30 DIAGNOSIS — E1122 Type 2 diabetes mellitus with diabetic chronic kidney disease: Secondary | ICD-10-CM | POA: Diagnosis not present

## 2019-04-30 DIAGNOSIS — I130 Hypertensive heart and chronic kidney disease with heart failure and stage 1 through stage 4 chronic kidney disease, or unspecified chronic kidney disease: Secondary | ICD-10-CM | POA: Diagnosis not present

## 2019-04-30 DIAGNOSIS — N184 Chronic kidney disease, stage 4 (severe): Secondary | ICD-10-CM | POA: Diagnosis not present

## 2019-04-30 DIAGNOSIS — I509 Heart failure, unspecified: Secondary | ICD-10-CM | POA: Diagnosis not present

## 2019-05-01 DIAGNOSIS — I509 Heart failure, unspecified: Secondary | ICD-10-CM | POA: Diagnosis not present

## 2019-05-01 DIAGNOSIS — D631 Anemia in chronic kidney disease: Secondary | ICD-10-CM | POA: Diagnosis not present

## 2019-05-01 DIAGNOSIS — N184 Chronic kidney disease, stage 4 (severe): Secondary | ICD-10-CM | POA: Diagnosis not present

## 2019-05-01 DIAGNOSIS — I130 Hypertensive heart and chronic kidney disease with heart failure and stage 1 through stage 4 chronic kidney disease, or unspecified chronic kidney disease: Secondary | ICD-10-CM | POA: Diagnosis not present

## 2019-05-01 DIAGNOSIS — I350 Nonrheumatic aortic (valve) stenosis: Secondary | ICD-10-CM | POA: Diagnosis not present

## 2019-05-01 DIAGNOSIS — E1122 Type 2 diabetes mellitus with diabetic chronic kidney disease: Secondary | ICD-10-CM | POA: Diagnosis not present

## 2019-05-03 DIAGNOSIS — I130 Hypertensive heart and chronic kidney disease with heart failure and stage 1 through stage 4 chronic kidney disease, or unspecified chronic kidney disease: Secondary | ICD-10-CM | POA: Diagnosis not present

## 2019-05-03 DIAGNOSIS — D631 Anemia in chronic kidney disease: Secondary | ICD-10-CM | POA: Diagnosis not present

## 2019-05-03 DIAGNOSIS — I509 Heart failure, unspecified: Secondary | ICD-10-CM | POA: Diagnosis not present

## 2019-05-03 DIAGNOSIS — I350 Nonrheumatic aortic (valve) stenosis: Secondary | ICD-10-CM | POA: Diagnosis not present

## 2019-05-03 DIAGNOSIS — E1122 Type 2 diabetes mellitus with diabetic chronic kidney disease: Secondary | ICD-10-CM | POA: Diagnosis not present

## 2019-05-03 DIAGNOSIS — N184 Chronic kidney disease, stage 4 (severe): Secondary | ICD-10-CM | POA: Diagnosis not present

## 2019-05-04 DIAGNOSIS — I130 Hypertensive heart and chronic kidney disease with heart failure and stage 1 through stage 4 chronic kidney disease, or unspecified chronic kidney disease: Secondary | ICD-10-CM | POA: Diagnosis not present

## 2019-05-04 DIAGNOSIS — E1122 Type 2 diabetes mellitus with diabetic chronic kidney disease: Secondary | ICD-10-CM | POA: Diagnosis not present

## 2019-05-04 DIAGNOSIS — N184 Chronic kidney disease, stage 4 (severe): Secondary | ICD-10-CM | POA: Diagnosis not present

## 2019-05-04 DIAGNOSIS — D631 Anemia in chronic kidney disease: Secondary | ICD-10-CM | POA: Diagnosis not present

## 2019-05-04 DIAGNOSIS — I509 Heart failure, unspecified: Secondary | ICD-10-CM | POA: Diagnosis not present

## 2019-05-04 DIAGNOSIS — I350 Nonrheumatic aortic (valve) stenosis: Secondary | ICD-10-CM | POA: Diagnosis not present

## 2019-05-05 DIAGNOSIS — R159 Full incontinence of feces: Secondary | ICD-10-CM | POA: Diagnosis not present

## 2019-05-05 DIAGNOSIS — N184 Chronic kidney disease, stage 4 (severe): Secondary | ICD-10-CM | POA: Diagnosis not present

## 2019-05-05 DIAGNOSIS — F419 Anxiety disorder, unspecified: Secondary | ICD-10-CM | POA: Diagnosis not present

## 2019-05-05 DIAGNOSIS — G40909 Epilepsy, unspecified, not intractable, without status epilepticus: Secondary | ICD-10-CM | POA: Diagnosis not present

## 2019-05-05 DIAGNOSIS — E785 Hyperlipidemia, unspecified: Secondary | ICD-10-CM | POA: Diagnosis not present

## 2019-05-05 DIAGNOSIS — S90822D Blister (nonthermal), left foot, subsequent encounter: Secondary | ICD-10-CM | POA: Diagnosis not present

## 2019-05-05 DIAGNOSIS — E1122 Type 2 diabetes mellitus with diabetic chronic kidney disease: Secondary | ICD-10-CM | POA: Diagnosis not present

## 2019-05-05 DIAGNOSIS — G43909 Migraine, unspecified, not intractable, without status migrainosus: Secondary | ICD-10-CM | POA: Diagnosis not present

## 2019-05-05 DIAGNOSIS — F329 Major depressive disorder, single episode, unspecified: Secondary | ICD-10-CM | POA: Diagnosis not present

## 2019-05-05 DIAGNOSIS — I130 Hypertensive heart and chronic kidney disease with heart failure and stage 1 through stage 4 chronic kidney disease, or unspecified chronic kidney disease: Secondary | ICD-10-CM | POA: Diagnosis not present

## 2019-05-05 DIAGNOSIS — Z7401 Bed confinement status: Secondary | ICD-10-CM | POA: Diagnosis not present

## 2019-05-05 DIAGNOSIS — I25119 Atherosclerotic heart disease of native coronary artery with unspecified angina pectoris: Secondary | ICD-10-CM | POA: Diagnosis not present

## 2019-05-05 DIAGNOSIS — I252 Old myocardial infarction: Secondary | ICD-10-CM | POA: Diagnosis not present

## 2019-05-05 DIAGNOSIS — R32 Unspecified urinary incontinence: Secondary | ICD-10-CM | POA: Diagnosis not present

## 2019-05-05 DIAGNOSIS — I679 Cerebrovascular disease, unspecified: Secondary | ICD-10-CM | POA: Diagnosis not present

## 2019-05-05 DIAGNOSIS — Z9981 Dependence on supplemental oxygen: Secondary | ICD-10-CM | POA: Diagnosis not present

## 2019-05-05 DIAGNOSIS — S50812D Abrasion of left forearm, subsequent encounter: Secondary | ICD-10-CM | POA: Diagnosis not present

## 2019-05-05 DIAGNOSIS — I509 Heart failure, unspecified: Secondary | ICD-10-CM | POA: Diagnosis not present

## 2019-05-05 DIAGNOSIS — D631 Anemia in chronic kidney disease: Secondary | ICD-10-CM | POA: Diagnosis not present

## 2019-05-05 DIAGNOSIS — I350 Nonrheumatic aortic (valve) stenosis: Secondary | ICD-10-CM | POA: Diagnosis not present

## 2019-05-07 DIAGNOSIS — I130 Hypertensive heart and chronic kidney disease with heart failure and stage 1 through stage 4 chronic kidney disease, or unspecified chronic kidney disease: Secondary | ICD-10-CM | POA: Diagnosis not present

## 2019-05-07 DIAGNOSIS — I350 Nonrheumatic aortic (valve) stenosis: Secondary | ICD-10-CM | POA: Diagnosis not present

## 2019-05-07 DIAGNOSIS — N184 Chronic kidney disease, stage 4 (severe): Secondary | ICD-10-CM | POA: Diagnosis not present

## 2019-05-07 DIAGNOSIS — I509 Heart failure, unspecified: Secondary | ICD-10-CM | POA: Diagnosis not present

## 2019-05-07 DIAGNOSIS — E1122 Type 2 diabetes mellitus with diabetic chronic kidney disease: Secondary | ICD-10-CM | POA: Diagnosis not present

## 2019-05-07 DIAGNOSIS — D631 Anemia in chronic kidney disease: Secondary | ICD-10-CM | POA: Diagnosis not present

## 2019-05-08 DIAGNOSIS — I350 Nonrheumatic aortic (valve) stenosis: Secondary | ICD-10-CM | POA: Diagnosis not present

## 2019-05-08 DIAGNOSIS — I130 Hypertensive heart and chronic kidney disease with heart failure and stage 1 through stage 4 chronic kidney disease, or unspecified chronic kidney disease: Secondary | ICD-10-CM | POA: Diagnosis not present

## 2019-05-08 DIAGNOSIS — N184 Chronic kidney disease, stage 4 (severe): Secondary | ICD-10-CM | POA: Diagnosis not present

## 2019-05-08 DIAGNOSIS — I509 Heart failure, unspecified: Secondary | ICD-10-CM | POA: Diagnosis not present

## 2019-05-08 DIAGNOSIS — E1122 Type 2 diabetes mellitus with diabetic chronic kidney disease: Secondary | ICD-10-CM | POA: Diagnosis not present

## 2019-05-08 DIAGNOSIS — D631 Anemia in chronic kidney disease: Secondary | ICD-10-CM | POA: Diagnosis not present

## 2019-05-10 DIAGNOSIS — I509 Heart failure, unspecified: Secondary | ICD-10-CM | POA: Diagnosis not present

## 2019-05-10 DIAGNOSIS — I130 Hypertensive heart and chronic kidney disease with heart failure and stage 1 through stage 4 chronic kidney disease, or unspecified chronic kidney disease: Secondary | ICD-10-CM | POA: Diagnosis not present

## 2019-05-10 DIAGNOSIS — E1122 Type 2 diabetes mellitus with diabetic chronic kidney disease: Secondary | ICD-10-CM | POA: Diagnosis not present

## 2019-05-10 DIAGNOSIS — I350 Nonrheumatic aortic (valve) stenosis: Secondary | ICD-10-CM | POA: Diagnosis not present

## 2019-05-10 DIAGNOSIS — D631 Anemia in chronic kidney disease: Secondary | ICD-10-CM | POA: Diagnosis not present

## 2019-05-10 DIAGNOSIS — N184 Chronic kidney disease, stage 4 (severe): Secondary | ICD-10-CM | POA: Diagnosis not present

## 2019-05-15 DIAGNOSIS — D631 Anemia in chronic kidney disease: Secondary | ICD-10-CM | POA: Diagnosis not present

## 2019-05-15 DIAGNOSIS — I130 Hypertensive heart and chronic kidney disease with heart failure and stage 1 through stage 4 chronic kidney disease, or unspecified chronic kidney disease: Secondary | ICD-10-CM | POA: Diagnosis not present

## 2019-05-15 DIAGNOSIS — E1122 Type 2 diabetes mellitus with diabetic chronic kidney disease: Secondary | ICD-10-CM | POA: Diagnosis not present

## 2019-05-15 DIAGNOSIS — N184 Chronic kidney disease, stage 4 (severe): Secondary | ICD-10-CM | POA: Diagnosis not present

## 2019-05-15 DIAGNOSIS — I509 Heart failure, unspecified: Secondary | ICD-10-CM | POA: Diagnosis not present

## 2019-05-15 DIAGNOSIS — I350 Nonrheumatic aortic (valve) stenosis: Secondary | ICD-10-CM | POA: Diagnosis not present

## 2019-05-16 DIAGNOSIS — N184 Chronic kidney disease, stage 4 (severe): Secondary | ICD-10-CM | POA: Diagnosis not present

## 2019-05-16 DIAGNOSIS — I130 Hypertensive heart and chronic kidney disease with heart failure and stage 1 through stage 4 chronic kidney disease, or unspecified chronic kidney disease: Secondary | ICD-10-CM | POA: Diagnosis not present

## 2019-05-16 DIAGNOSIS — I350 Nonrheumatic aortic (valve) stenosis: Secondary | ICD-10-CM | POA: Diagnosis not present

## 2019-05-16 DIAGNOSIS — I509 Heart failure, unspecified: Secondary | ICD-10-CM | POA: Diagnosis not present

## 2019-05-16 DIAGNOSIS — D631 Anemia in chronic kidney disease: Secondary | ICD-10-CM | POA: Diagnosis not present

## 2019-05-16 DIAGNOSIS — E1122 Type 2 diabetes mellitus with diabetic chronic kidney disease: Secondary | ICD-10-CM | POA: Diagnosis not present

## 2019-05-17 DIAGNOSIS — I350 Nonrheumatic aortic (valve) stenosis: Secondary | ICD-10-CM | POA: Diagnosis not present

## 2019-05-17 DIAGNOSIS — D631 Anemia in chronic kidney disease: Secondary | ICD-10-CM | POA: Diagnosis not present

## 2019-05-17 DIAGNOSIS — I130 Hypertensive heart and chronic kidney disease with heart failure and stage 1 through stage 4 chronic kidney disease, or unspecified chronic kidney disease: Secondary | ICD-10-CM | POA: Diagnosis not present

## 2019-05-17 DIAGNOSIS — I509 Heart failure, unspecified: Secondary | ICD-10-CM | POA: Diagnosis not present

## 2019-05-17 DIAGNOSIS — N184 Chronic kidney disease, stage 4 (severe): Secondary | ICD-10-CM | POA: Diagnosis not present

## 2019-05-17 DIAGNOSIS — E1122 Type 2 diabetes mellitus with diabetic chronic kidney disease: Secondary | ICD-10-CM | POA: Diagnosis not present

## 2019-05-18 DIAGNOSIS — I350 Nonrheumatic aortic (valve) stenosis: Secondary | ICD-10-CM | POA: Diagnosis not present

## 2019-05-18 DIAGNOSIS — E1122 Type 2 diabetes mellitus with diabetic chronic kidney disease: Secondary | ICD-10-CM | POA: Diagnosis not present

## 2019-05-18 DIAGNOSIS — N184 Chronic kidney disease, stage 4 (severe): Secondary | ICD-10-CM | POA: Diagnosis not present

## 2019-05-18 DIAGNOSIS — D631 Anemia in chronic kidney disease: Secondary | ICD-10-CM | POA: Diagnosis not present

## 2019-05-18 DIAGNOSIS — I130 Hypertensive heart and chronic kidney disease with heart failure and stage 1 through stage 4 chronic kidney disease, or unspecified chronic kidney disease: Secondary | ICD-10-CM | POA: Diagnosis not present

## 2019-05-18 DIAGNOSIS — I509 Heart failure, unspecified: Secondary | ICD-10-CM | POA: Diagnosis not present

## 2019-05-21 DIAGNOSIS — I130 Hypertensive heart and chronic kidney disease with heart failure and stage 1 through stage 4 chronic kidney disease, or unspecified chronic kidney disease: Secondary | ICD-10-CM | POA: Diagnosis not present

## 2019-05-21 DIAGNOSIS — N184 Chronic kidney disease, stage 4 (severe): Secondary | ICD-10-CM | POA: Diagnosis not present

## 2019-05-21 DIAGNOSIS — I509 Heart failure, unspecified: Secondary | ICD-10-CM | POA: Diagnosis not present

## 2019-05-21 DIAGNOSIS — E1122 Type 2 diabetes mellitus with diabetic chronic kidney disease: Secondary | ICD-10-CM | POA: Diagnosis not present

## 2019-05-21 DIAGNOSIS — I350 Nonrheumatic aortic (valve) stenosis: Secondary | ICD-10-CM | POA: Diagnosis not present

## 2019-05-21 DIAGNOSIS — D631 Anemia in chronic kidney disease: Secondary | ICD-10-CM | POA: Diagnosis not present

## 2019-05-22 DIAGNOSIS — I350 Nonrheumatic aortic (valve) stenosis: Secondary | ICD-10-CM | POA: Diagnosis not present

## 2019-05-22 DIAGNOSIS — N184 Chronic kidney disease, stage 4 (severe): Secondary | ICD-10-CM | POA: Diagnosis not present

## 2019-05-22 DIAGNOSIS — I509 Heart failure, unspecified: Secondary | ICD-10-CM | POA: Diagnosis not present

## 2019-05-22 DIAGNOSIS — E1122 Type 2 diabetes mellitus with diabetic chronic kidney disease: Secondary | ICD-10-CM | POA: Diagnosis not present

## 2019-05-22 DIAGNOSIS — D631 Anemia in chronic kidney disease: Secondary | ICD-10-CM | POA: Diagnosis not present

## 2019-05-22 DIAGNOSIS — I130 Hypertensive heart and chronic kidney disease with heart failure and stage 1 through stage 4 chronic kidney disease, or unspecified chronic kidney disease: Secondary | ICD-10-CM | POA: Diagnosis not present

## 2019-06-05 DEATH — deceased

## 2020-06-03 IMAGING — DX DG CHEST 1V PORT
1 series · 1 of 1 positions shown · non-contrast
Comparison: 01/22/2018

CLINICAL DATA: Check endotracheal tube placement

EXAM:
PORTABLE CHEST 1 VIEW

[chest ap]
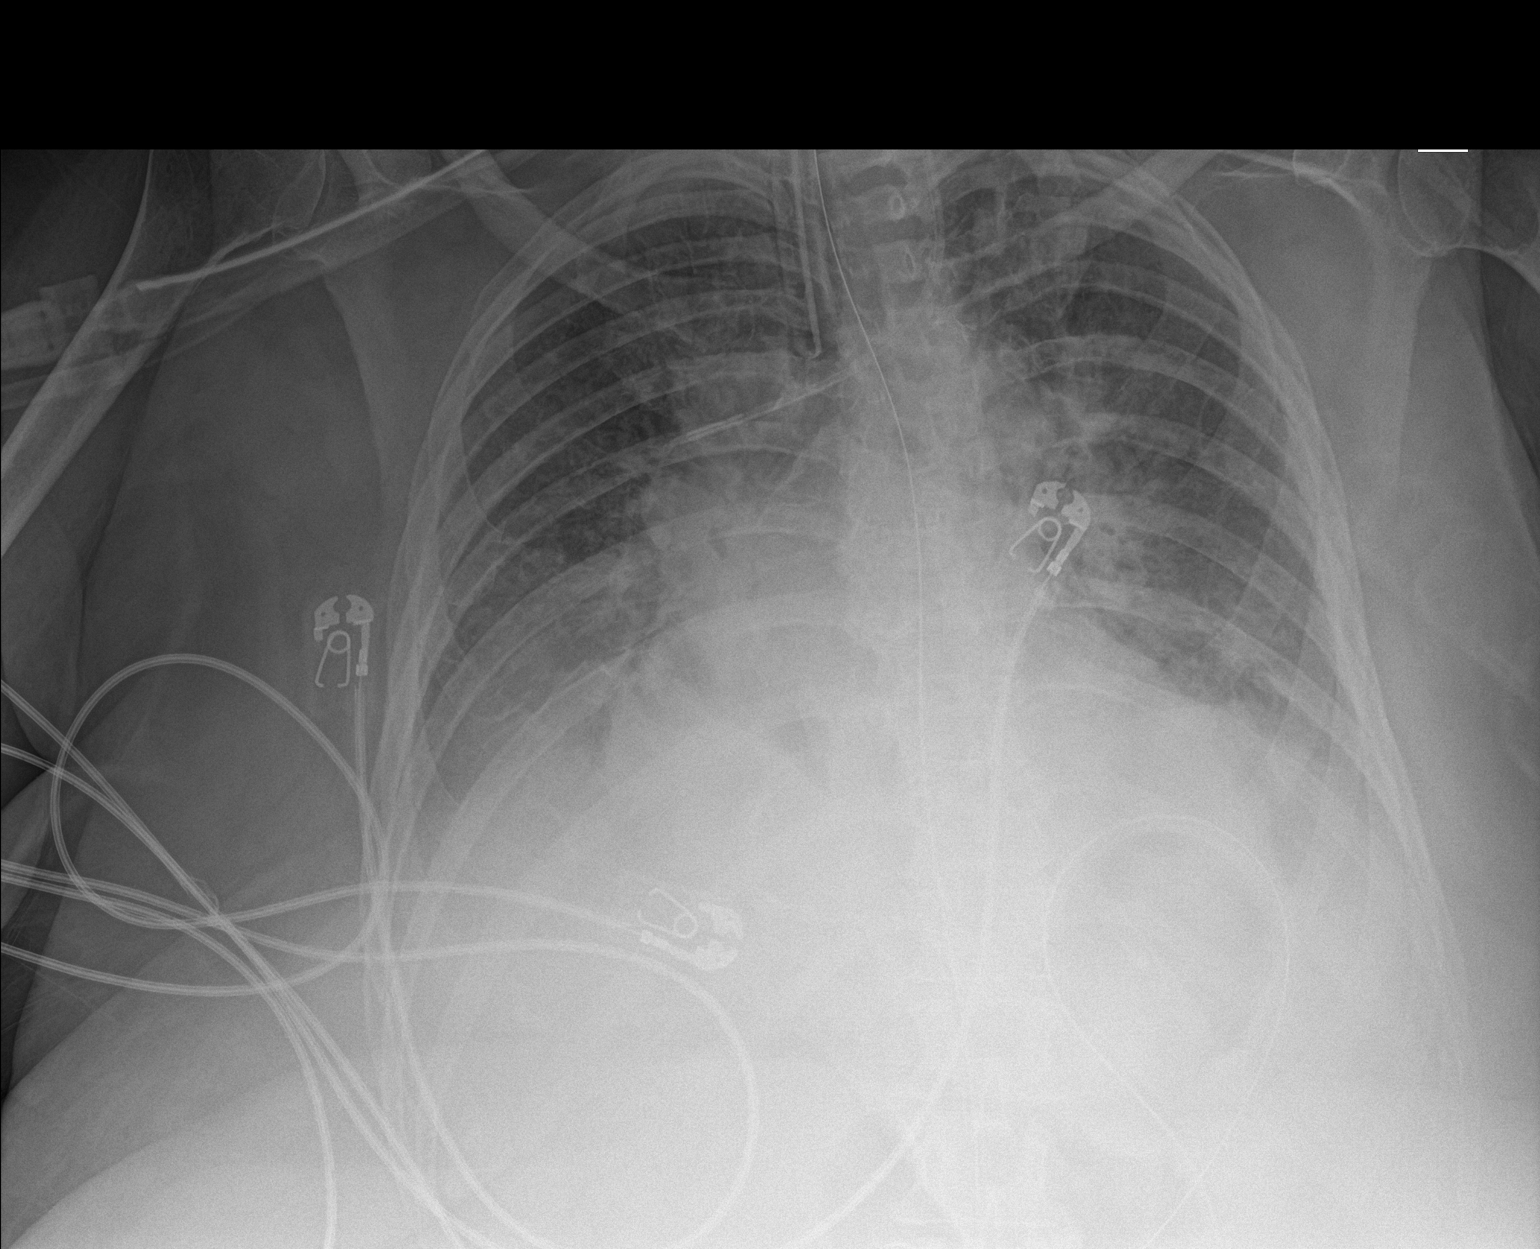

[1 of 1 positions shown; findings below may reference images not displayed]

FINDINGS: Endotracheal tube is been withdrawn slightly when compared with the
prior exam now lying 2 cm above the carina. It previously measured
12 mm above the carina. Nasogastric catheter is noted coiled within
the stomach. Left jugular central line is again seen and stable.
Cardiac shadow remains enlarged. Central vascular congestion and
pulmonary edema with bilateral pleural effusions is seen.
IMPRESSION: Changes consistent with CHF slightly increased from the prior exam.

Tubes and lines as described above. Slight interval withdrawal of
the endotracheal tube is seen.

## 2020-06-03 IMAGING — CT CT HEAD W/O CM
4 series · 17 of 47 positions shown, 19 images · non-contrast
Comparison: CT head 07/22/2017

CLINICAL DATA: Confusion.  Unexplained bruising around eyes

EXAM:
CT HEAD WITHOUT CONTRAST
TECHNIQUE: Contiguous axial images were obtained from the base of the skull
through the vertex without intravenous contrast.

[Series 3: head without · axial · non-contrast · 0.39mm/px · z∈[-191,-71]mm · 7 of 32 slices shown, 9 images]
[im 4/32  brain]
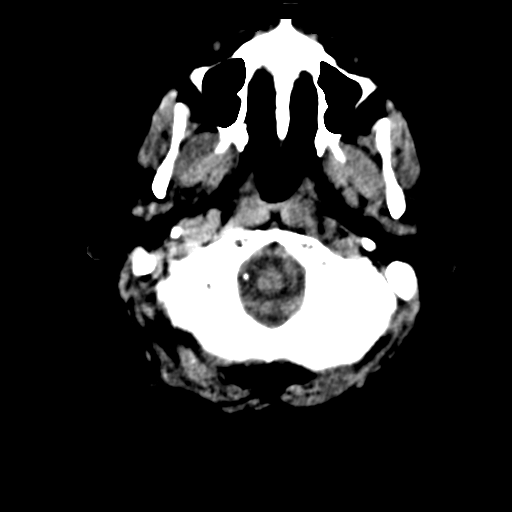
[im 4/32  bone]
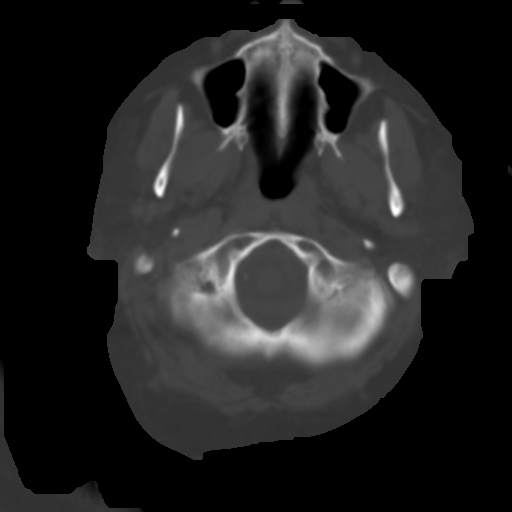
[im 8/32  brain]
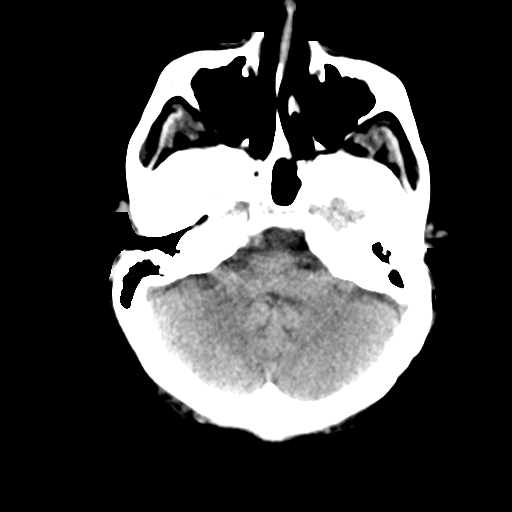
[im 12/32  brain]
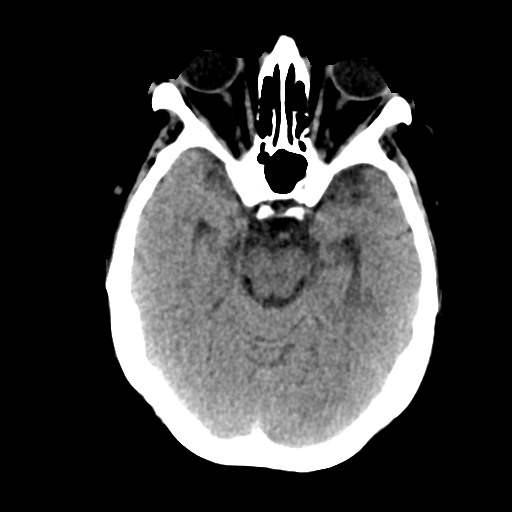
[im 16/32  brain]
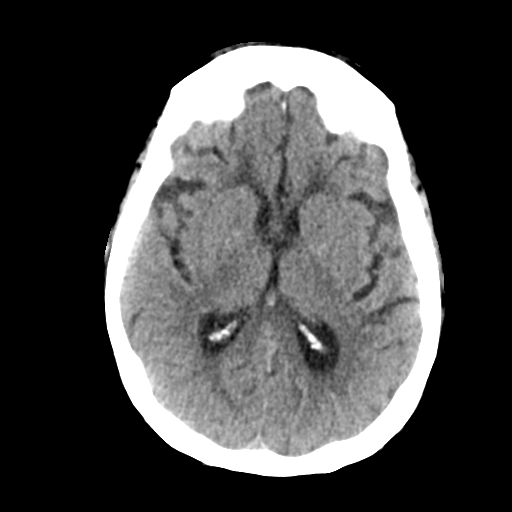
[im 20/32  brain]
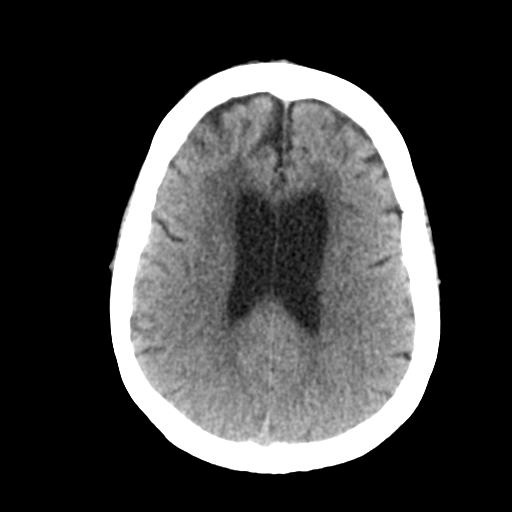
[im 20/32  bone]
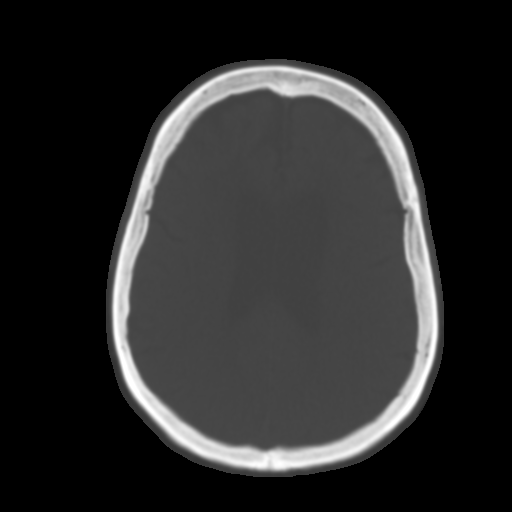
[im 24/32  brain]
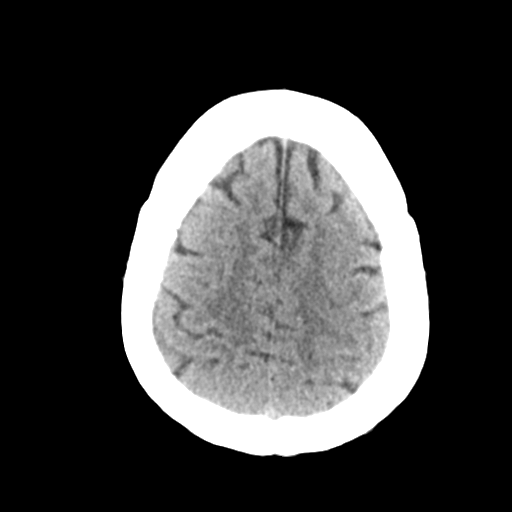
[im 28/32  brain]
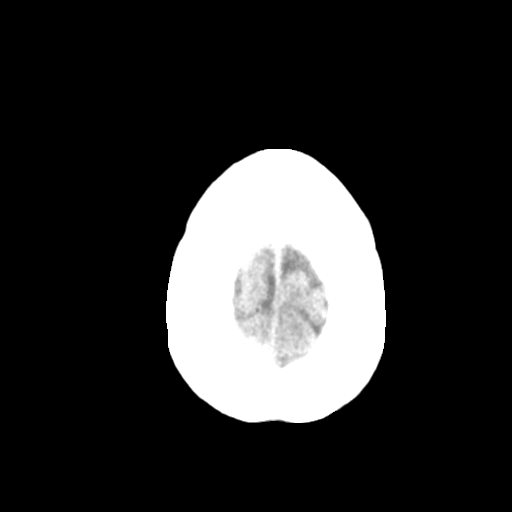

[Series 4: head bone · axial · 0.39mm/px · z∈[-192,-136]mm · 4 of 79 slices shown]
[im 8/79  bone]
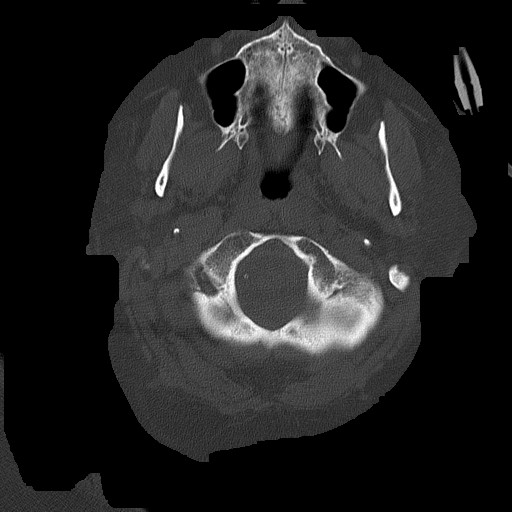
[im 16/79  bone]
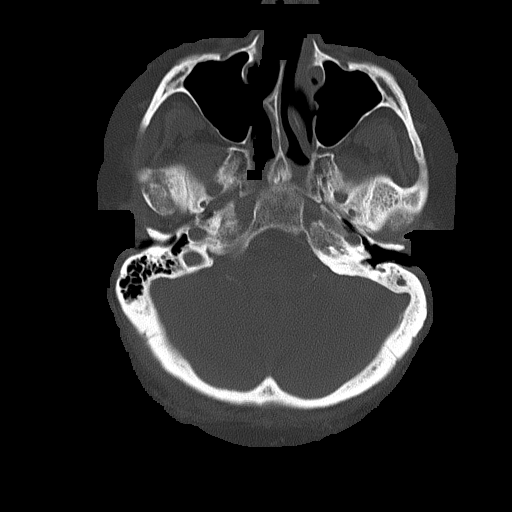
[im 24/79  bone]
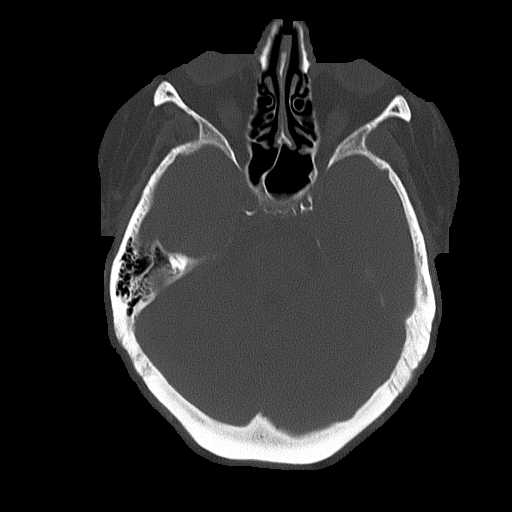
[im 36/79  bone]
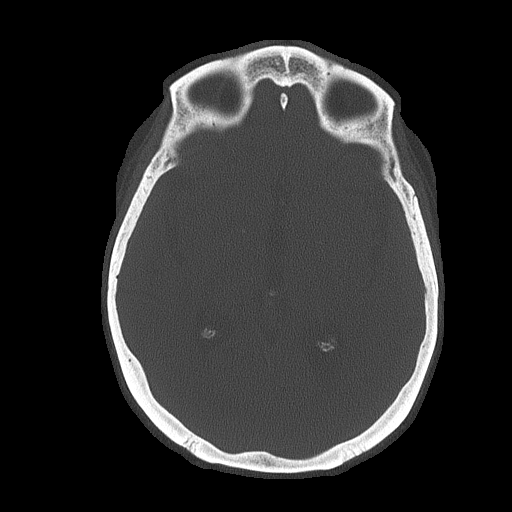

[Series 5: head without cor · coronal · non-contrast · 0.30mm/px · 3 of 67 slices shown]
[im 23/67  brain]
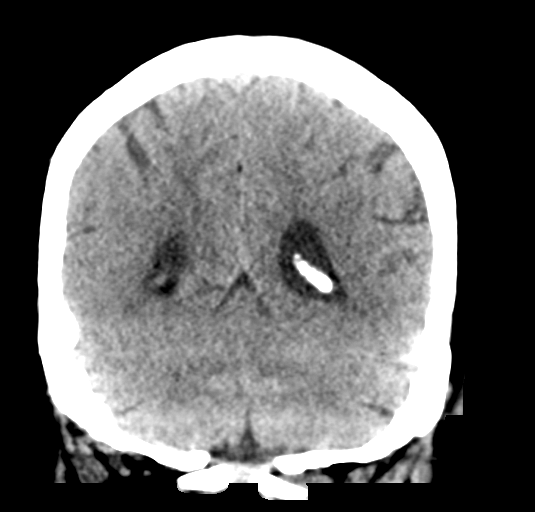
[im 30/67  brain]
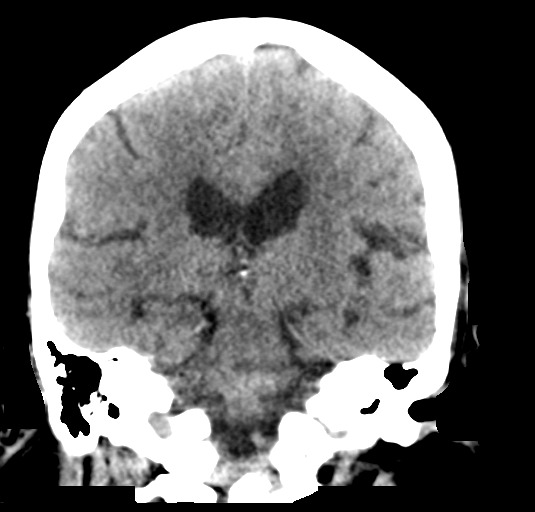
[im 37/67  brain]
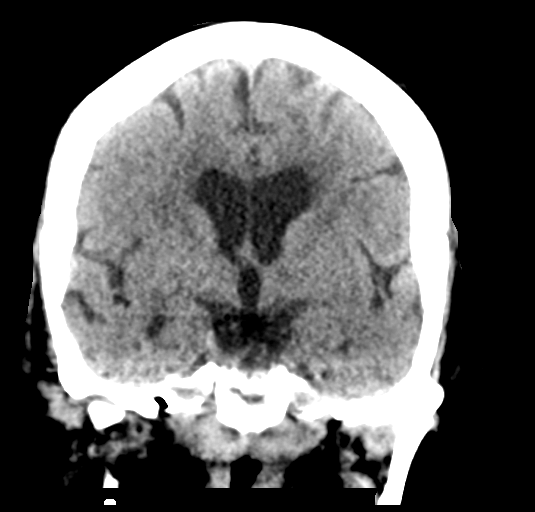

[Series 6: head without sag · sagittal · non-contrast · 0.30mm/px · 3 of 67 slices shown]
[im 23/67  brain]
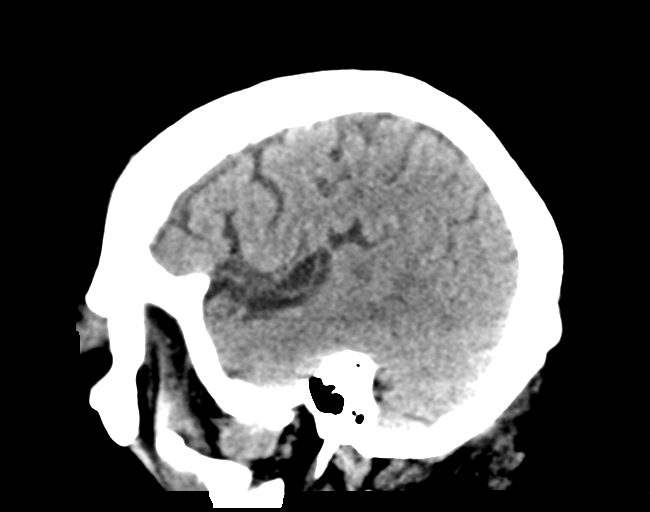
[im 34/67  brain]
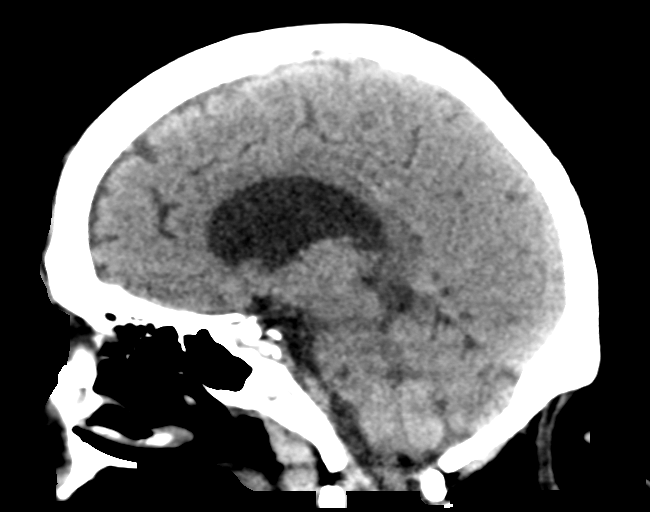
[im 45/67  brain]
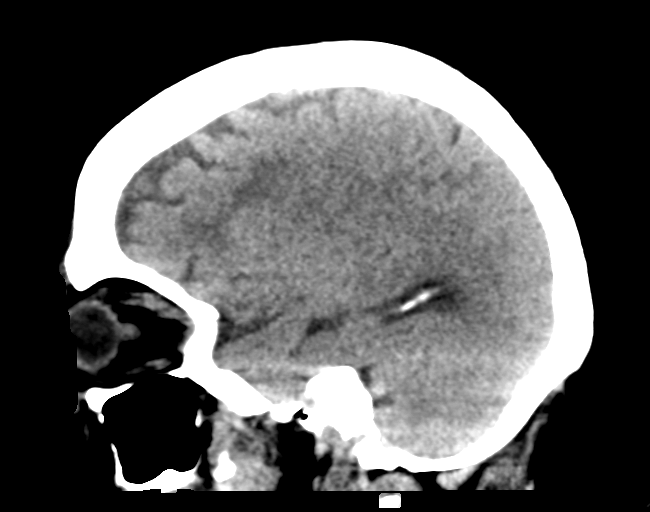

[17 of 47 positions shown; findings below may reference images not displayed]

FINDINGS: Brain: Mild atrophy. Chronic microvascular ischemic changes in the
white matter bilaterally. Negative for acute infarct, hemorrhage, or
mass lesion. No midline shift.

Vascular: Negative for hyperdense vessel

Skull: Negative

Sinuses/Orbits: Paranasal sinuses clear. Normal orbit. No orbital
fracture identified.

Other: None
IMPRESSION: Atrophy and chronic microvascular ischemic change in the white
matter. No acute abnormality.

## 2020-06-04 IMAGING — DX DG CHEST 1V PORT
1 series · 1 of 1 positions shown · non-contrast
Comparison: Yesterday

CLINICAL DATA: Endotracheal tube

EXAM:
PORTABLE CHEST 1 VIEW

[chest ap]
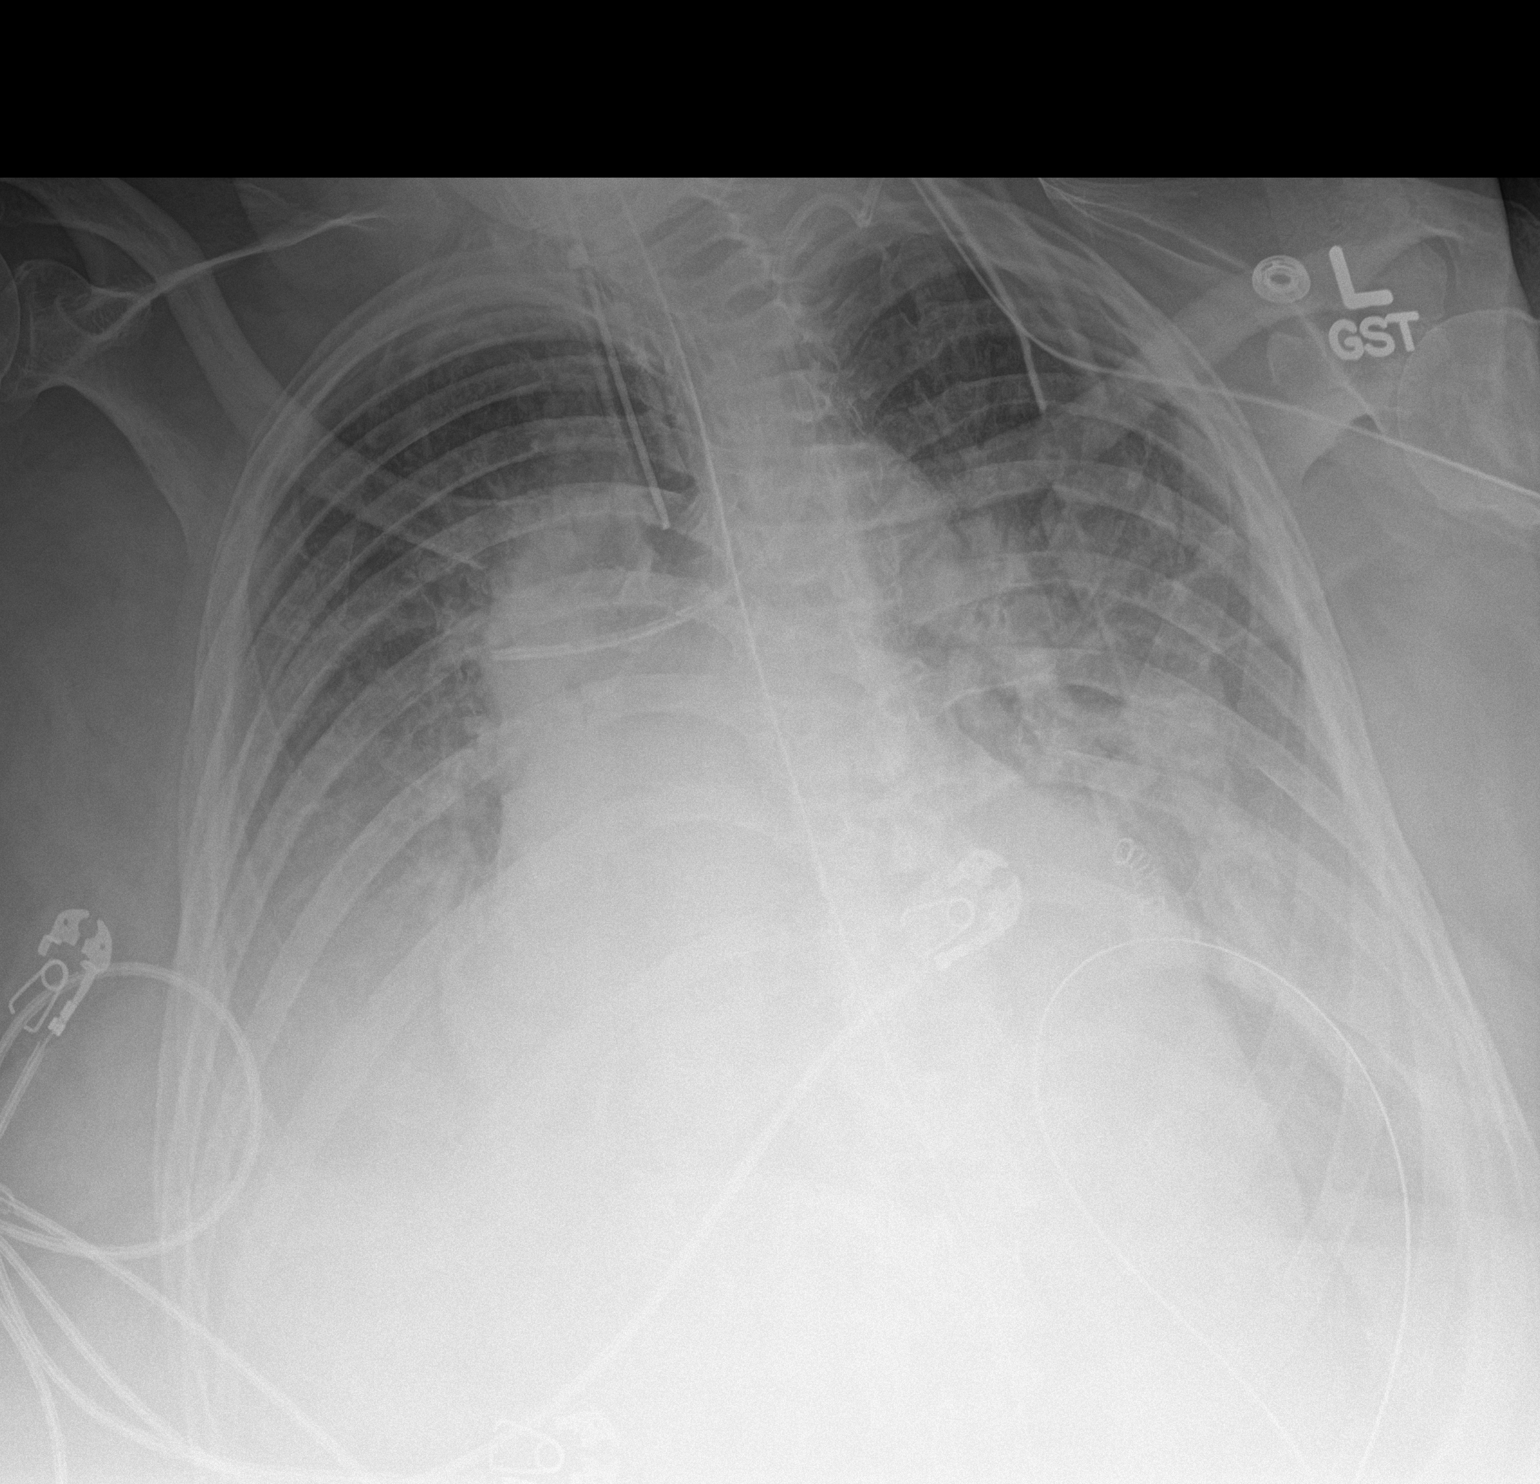

[1 of 1 positions shown; findings below may reference images not displayed]

FINDINGS: Endotracheal tube tip is at the clavicular heads. Left IJ line with
tip at the left brachiocephalic SVC junction. An orogastric tube is
coiled in the stomach.

Haziness of the bilateral mid and lower chest. Cardiomegaly. No
pneumothorax.
IMPRESSION: 1. Stable hardware positioning.
2. Unchanged bilateral hazy lower lung opacity likely from edema,
atelectasis and pleural fluid.

## 2020-07-17 IMAGING — DX DG CHEST 1V PORT
1 series · 1 of 1 positions shown · non-contrast
Comparison: None.

CLINICAL DATA: Chest pain

EXAM:
PORTABLE CHEST 1 VIEW

[chest ap]
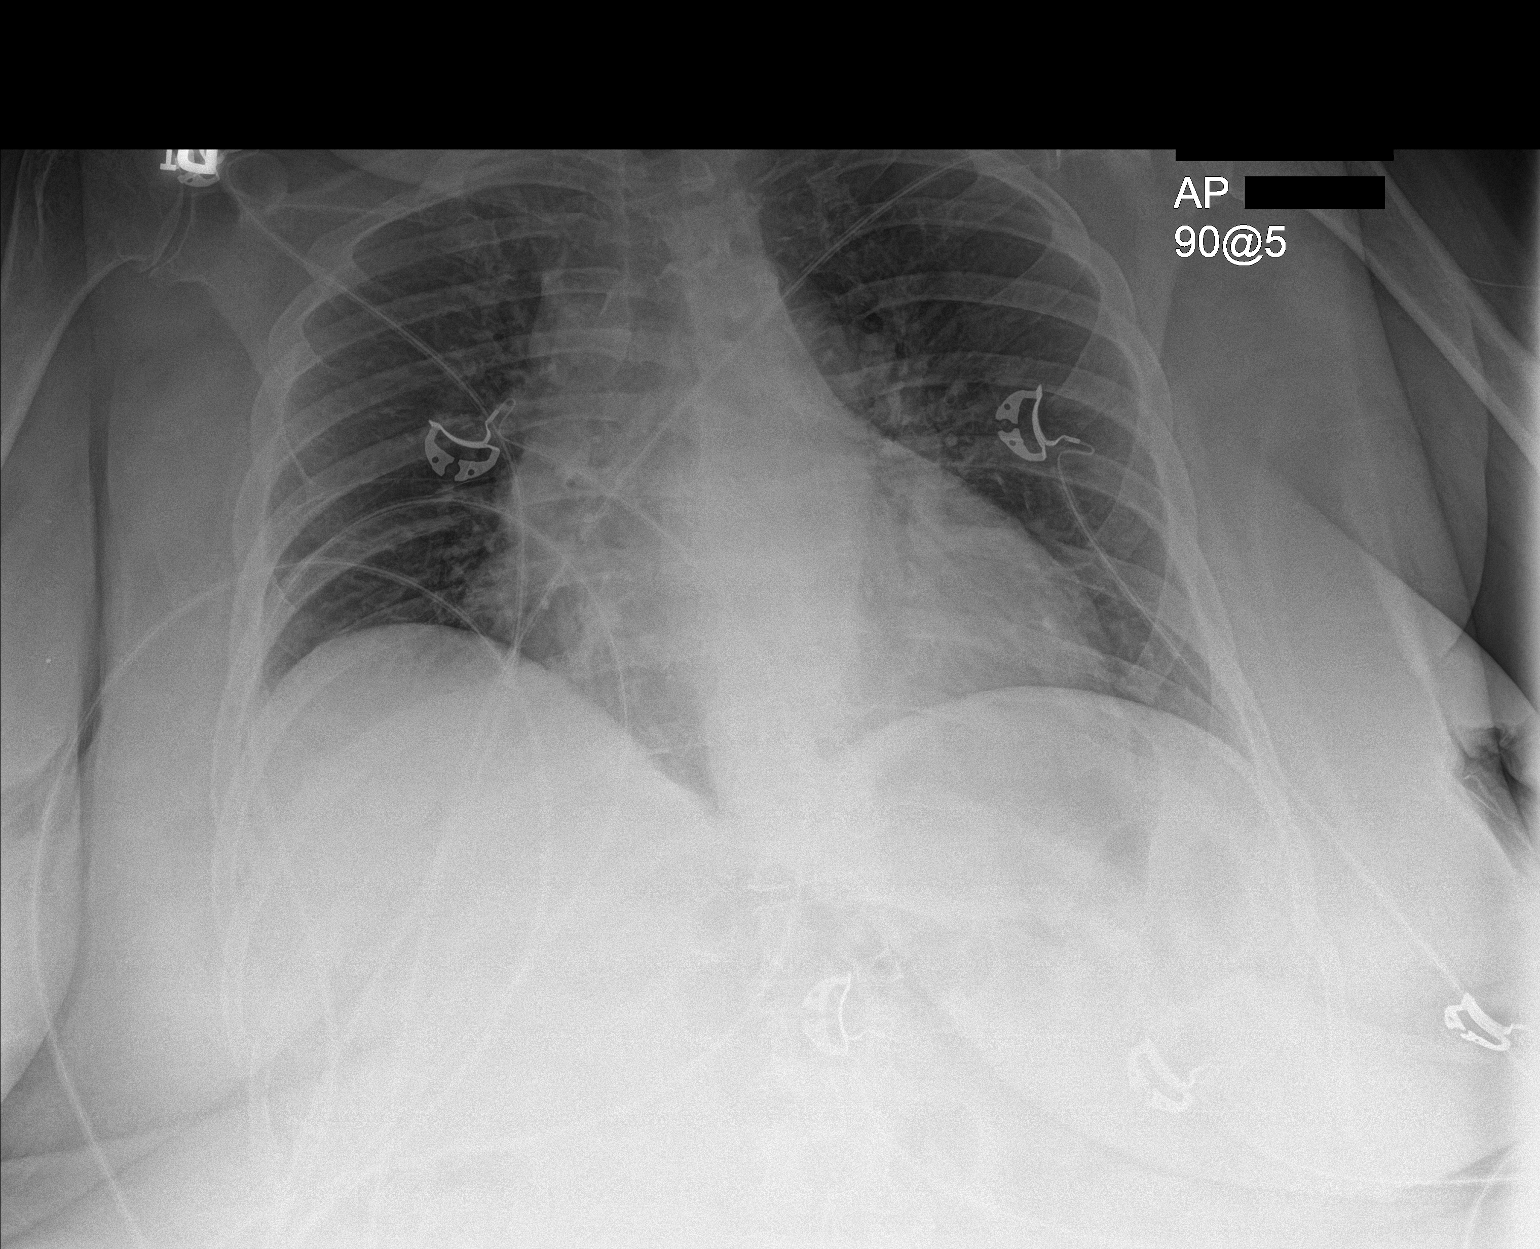

[1 of 1 positions shown; findings below may reference images not displayed]

FINDINGS: Mild cardiomegaly. Low lung volumes. No consolidation or effusion.
No pneumothorax.
IMPRESSION: No active disease.  Cardiomegaly.
